# Patient Record
Sex: Male | Born: 1940 | Race: White | Hispanic: No | Marital: Married | Smoking: Former smoker
Health system: Southern US, Community
[De-identification: ages and names within clinical notes are randomized; demographics above are authoritative.]

## PROBLEM LIST (undated history)

## (undated) DIAGNOSIS — R269 Unspecified abnormalities of gait and mobility: Secondary | ICD-10-CM

## (undated) DIAGNOSIS — L89151 Pressure ulcer of sacral region, stage 1: Secondary | ICD-10-CM

## (undated) DIAGNOSIS — Z972 Presence of dental prosthetic device (complete) (partial): Secondary | ICD-10-CM

## (undated) DIAGNOSIS — G8929 Other chronic pain: Secondary | ICD-10-CM

## (undated) DIAGNOSIS — H9190 Unspecified hearing loss, unspecified ear: Secondary | ICD-10-CM

## (undated) DIAGNOSIS — I700 Atherosclerosis of aorta: Secondary | ICD-10-CM

## (undated) DIAGNOSIS — M1990 Unspecified osteoarthritis, unspecified site: Secondary | ICD-10-CM

## (undated) DIAGNOSIS — F329 Major depressive disorder, single episode, unspecified: Secondary | ICD-10-CM

## (undated) DIAGNOSIS — F419 Anxiety disorder, unspecified: Secondary | ICD-10-CM

## (undated) DIAGNOSIS — N400 Enlarged prostate without lower urinary tract symptoms: Secondary | ICD-10-CM

## (undated) DIAGNOSIS — E785 Hyperlipidemia, unspecified: Secondary | ICD-10-CM

## (undated) DIAGNOSIS — I5030 Unspecified diastolic (congestive) heart failure: Secondary | ICD-10-CM

## (undated) DIAGNOSIS — M47812 Spondylosis without myelopathy or radiculopathy, cervical region: Secondary | ICD-10-CM

## (undated) DIAGNOSIS — J42 Unspecified chronic bronchitis: Secondary | ICD-10-CM

## (undated) DIAGNOSIS — N201 Calculus of ureter: Secondary | ICD-10-CM

## (undated) DIAGNOSIS — K649 Unspecified hemorrhoids: Secondary | ICD-10-CM

## (undated) DIAGNOSIS — R29898 Other symptoms and signs involving the musculoskeletal system: Secondary | ICD-10-CM

## (undated) DIAGNOSIS — R200 Anesthesia of skin: Secondary | ICD-10-CM

## (undated) DIAGNOSIS — Z8619 Personal history of other infectious and parasitic diseases: Secondary | ICD-10-CM

## (undated) DIAGNOSIS — K219 Gastro-esophageal reflux disease without esophagitis: Secondary | ICD-10-CM

## (undated) DIAGNOSIS — K5730 Diverticulosis of large intestine without perforation or abscess without bleeding: Secondary | ICD-10-CM

## (undated) DIAGNOSIS — K08109 Complete loss of teeth, unspecified cause, unspecified class: Secondary | ICD-10-CM

## (undated) DIAGNOSIS — F32A Depression, unspecified: Secondary | ICD-10-CM

## (undated) DIAGNOSIS — R600 Localized edema: Secondary | ICD-10-CM

## (undated) DIAGNOSIS — N200 Calculus of kidney: Secondary | ICD-10-CM

## (undated) DIAGNOSIS — J439 Emphysema, unspecified: Secondary | ICD-10-CM

## (undated) DIAGNOSIS — M549 Dorsalgia, unspecified: Secondary | ICD-10-CM

## (undated) DIAGNOSIS — Z789 Other specified health status: Secondary | ICD-10-CM

## (undated) DIAGNOSIS — M1A9XX0 Chronic gout, unspecified, without tophus (tophi): Secondary | ICD-10-CM

## (undated) DIAGNOSIS — I4510 Unspecified right bundle-branch block: Secondary | ICD-10-CM

## (undated) HISTORY — PX: CARPAL TUNNEL RELEASE: SHX101

## (undated) HISTORY — PX: ABDOMINAL HERNIA REPAIR: SHX539

## (undated) HISTORY — DX: Atherosclerosis of aorta: I70.0

## (undated) HISTORY — PX: LAPAROSCOPIC CHOLECYSTECTOMY: SUR755

## (undated) HISTORY — DX: Unspecified hemorrhoids: K64.9

## (undated) HISTORY — PX: ESOPHAGOGASTRODUODENOSCOPY: SHX1529

## (undated) HISTORY — DX: Unspecified abnormalities of gait and mobility: R26.9

## (undated) HISTORY — PX: TONSILLECTOMY AND ADENOIDECTOMY: SUR1326

---

## 1993-04-19 HISTORY — PX: CERVICAL FUSION: SHX112

## 1998-05-27 ENCOUNTER — Ambulatory Visit (HOSPITAL_COMMUNITY): Admission: RE | Admit: 1998-05-27 | Discharge: 1998-05-27 | Payer: Self-pay | Admitting: Cardiology

## 1998-05-27 ENCOUNTER — Encounter: Payer: Self-pay | Admitting: Cardiology

## 1998-05-27 HISTORY — PX: CARDIOVASCULAR STRESS TEST: SHX262

## 1999-04-20 LAB — HM COLONOSCOPY: HM Colonoscopy: NORMAL

## 2000-07-28 ENCOUNTER — Ambulatory Visit (HOSPITAL_COMMUNITY): Admission: RE | Admit: 2000-07-28 | Discharge: 2000-07-28 | Payer: Self-pay | Admitting: Urology

## 2000-07-28 ENCOUNTER — Encounter: Payer: Self-pay | Admitting: Urology

## 2000-07-28 HISTORY — PX: OTHER SURGICAL HISTORY: SHX169

## 2000-08-04 ENCOUNTER — Encounter: Admission: RE | Admit: 2000-08-04 | Discharge: 2000-08-04 | Payer: Self-pay | Admitting: Urology

## 2000-08-04 ENCOUNTER — Encounter: Payer: Self-pay | Admitting: Urology

## 2001-01-26 ENCOUNTER — Encounter: Payer: Self-pay | Admitting: Neurosurgery

## 2001-01-26 ENCOUNTER — Ambulatory Visit (HOSPITAL_COMMUNITY): Admission: RE | Admit: 2001-01-26 | Discharge: 2001-01-26 | Payer: Self-pay | Admitting: Neurosurgery

## 2001-07-03 ENCOUNTER — Ambulatory Visit (HOSPITAL_COMMUNITY): Admission: RE | Admit: 2001-07-03 | Discharge: 2001-07-03 | Payer: Self-pay | Admitting: Neurosurgery

## 2001-07-03 ENCOUNTER — Encounter: Payer: Self-pay | Admitting: Neurosurgery

## 2001-07-03 HISTORY — PX: ANTERIOR CERVICAL DECOMP/DISCECTOMY FUSION: SHX1161

## 2001-07-11 ENCOUNTER — Encounter: Payer: Self-pay | Admitting: Emergency Medicine

## 2001-07-11 ENCOUNTER — Observation Stay (HOSPITAL_COMMUNITY): Admission: EM | Admit: 2001-07-11 | Discharge: 2001-07-12 | Payer: Self-pay | Admitting: Emergency Medicine

## 2001-08-10 ENCOUNTER — Encounter: Payer: Self-pay | Admitting: Neurosurgery

## 2001-08-10 ENCOUNTER — Encounter: Admission: RE | Admit: 2001-08-10 | Discharge: 2001-08-10 | Payer: Self-pay | Admitting: Neurosurgery

## 2001-09-12 ENCOUNTER — Encounter: Payer: Self-pay | Admitting: Neurosurgery

## 2001-09-12 ENCOUNTER — Encounter: Admission: RE | Admit: 2001-09-12 | Discharge: 2001-09-12 | Payer: Self-pay | Admitting: Neurosurgery

## 2002-03-12 ENCOUNTER — Encounter: Admission: RE | Admit: 2002-03-12 | Discharge: 2002-03-12 | Payer: Self-pay | Admitting: Neurosurgery

## 2002-03-12 ENCOUNTER — Encounter: Payer: Self-pay | Admitting: Neurosurgery

## 2004-03-05 ENCOUNTER — Ambulatory Visit: Payer: Self-pay | Admitting: Internal Medicine

## 2004-09-18 ENCOUNTER — Ambulatory Visit: Payer: Self-pay | Admitting: Internal Medicine

## 2005-06-11 ENCOUNTER — Ambulatory Visit: Payer: Self-pay | Admitting: Gastroenterology

## 2006-04-19 HISTORY — PX: BLEPHAROPLASTY: SUR158

## 2006-06-16 ENCOUNTER — Ambulatory Visit (HOSPITAL_COMMUNITY): Admission: RE | Admit: 2006-06-16 | Discharge: 2006-06-16 | Payer: Self-pay | Admitting: Gastroenterology

## 2007-07-06 ENCOUNTER — Encounter: Admission: RE | Admit: 2007-07-06 | Discharge: 2007-07-06 | Payer: Self-pay | Admitting: Neurosurgery

## 2007-08-09 ENCOUNTER — Ambulatory Visit (HOSPITAL_COMMUNITY): Admission: RE | Admit: 2007-08-09 | Discharge: 2007-08-10 | Payer: Self-pay | Admitting: Neurosurgery

## 2007-08-09 HISTORY — PX: OTHER SURGICAL HISTORY: SHX169

## 2007-08-17 ENCOUNTER — Ambulatory Visit (HOSPITAL_COMMUNITY): Admission: AD | Admit: 2007-08-17 | Discharge: 2007-08-18 | Payer: Self-pay | Admitting: Neurosurgery

## 2007-08-17 ENCOUNTER — Encounter: Admission: RE | Admit: 2007-08-17 | Discharge: 2007-08-17 | Payer: Self-pay | Admitting: Neurosurgery

## 2008-03-12 ENCOUNTER — Encounter: Admission: RE | Admit: 2008-03-12 | Discharge: 2008-03-12 | Payer: Self-pay

## 2008-03-29 ENCOUNTER — Encounter: Admission: RE | Admit: 2008-03-29 | Discharge: 2008-03-29 | Payer: Self-pay | Admitting: Neurosurgery

## 2008-04-16 ENCOUNTER — Encounter: Admission: RE | Admit: 2008-04-16 | Discharge: 2008-04-16 | Payer: Self-pay | Admitting: Neurosurgery

## 2008-04-30 ENCOUNTER — Encounter: Admission: RE | Admit: 2008-04-30 | Discharge: 2008-04-30 | Payer: Self-pay | Admitting: Neurosurgery

## 2008-07-02 ENCOUNTER — Encounter: Admission: RE | Admit: 2008-07-02 | Discharge: 2008-07-02 | Payer: Self-pay | Admitting: Neurosurgery

## 2008-07-23 ENCOUNTER — Ambulatory Visit (HOSPITAL_BASED_OUTPATIENT_CLINIC_OR_DEPARTMENT_OTHER): Admission: RE | Admit: 2008-07-23 | Discharge: 2008-07-23 | Payer: Self-pay | Admitting: Orthopedic Surgery

## 2008-07-23 HISTORY — PX: NEUROPLASTY / TRANSPOSITION ULNAR NERVE AT ELBOW: SUR895

## 2009-07-21 ENCOUNTER — Encounter: Admission: RE | Admit: 2009-07-21 | Discharge: 2009-07-21 | Payer: Self-pay | Admitting: Neurosurgery

## 2009-07-23 ENCOUNTER — Encounter: Admission: RE | Admit: 2009-07-23 | Discharge: 2009-07-23 | Payer: Self-pay | Admitting: Neurosurgery

## 2009-09-13 ENCOUNTER — Encounter: Admission: RE | Admit: 2009-09-13 | Discharge: 2009-09-13 | Payer: Self-pay | Admitting: Neurosurgery

## 2009-09-22 ENCOUNTER — Ambulatory Visit (HOSPITAL_COMMUNITY): Admission: RE | Admit: 2009-09-22 | Discharge: 2009-09-22 | Payer: Self-pay | Admitting: Neurosurgery

## 2009-10-15 ENCOUNTER — Inpatient Hospital Stay (HOSPITAL_COMMUNITY): Admission: RE | Admit: 2009-10-15 | Discharge: 2009-10-15 | Payer: Self-pay | Admitting: Neurosurgery

## 2009-10-15 HISTORY — PX: OTHER SURGICAL HISTORY: SHX169

## 2010-05-28 ENCOUNTER — Other Ambulatory Visit: Payer: Self-pay | Admitting: Neurosurgery

## 2010-05-28 DIAGNOSIS — M545 Low back pain: Secondary | ICD-10-CM

## 2010-05-28 DIAGNOSIS — M542 Cervicalgia: Secondary | ICD-10-CM

## 2010-06-03 ENCOUNTER — Other Ambulatory Visit: Payer: Self-pay | Admitting: Neurosurgery

## 2010-06-03 ENCOUNTER — Ambulatory Visit
Admission: RE | Admit: 2010-06-03 | Discharge: 2010-06-03 | Disposition: A | Discharge status: Home / Self Care | Payer: Medicare Other | Source: Ambulatory Visit | Attending: Neurosurgery | Admitting: Neurosurgery

## 2010-06-03 DIAGNOSIS — M542 Cervicalgia: Secondary | ICD-10-CM

## 2010-06-03 DIAGNOSIS — M545 Low back pain: Secondary | ICD-10-CM

## 2010-06-03 MED ORDER — GADOBENATE DIMEGLUMINE 529 MG/ML IV SOLN: 19.0000 mL | Freq: Once | INTRAVENOUS | Status: AC | PRN

## 2010-07-05 LAB — CBC
HCT: 40.7 % (ref 39.0–52.0)
Hemoglobin: 14 g/dL (ref 13.0–17.0)
MCV: 96.9 fL (ref 78.0–100.0)
Platelets: 194 10*3/uL (ref 150–400)
RBC: 4.2 MIL/uL — ABNORMAL LOW (ref 4.22–5.81)

## 2010-07-05 LAB — BASIC METABOLIC PANEL
Calcium: 8.9 mg/dL (ref 8.4–10.5)
GFR calc non Af Amer: >60 mL/min (ref >60)

## 2010-07-06 LAB — CBC
HCT: 44.9 % (ref 39.0–52.0)
Hemoglobin: 15.6 g/dL (ref 13.0–17.0)
MCHC: 34.6 g/dL (ref 30.0–36.0)
MCV: 96.8 fL (ref 78.0–100.0)
Platelets: 217 10*3/uL (ref 150–400)
RDW: 12.8 % (ref 11.5–15.5)

## 2010-07-06 LAB — BASIC METABOLIC PANEL
CO2: 28 mEq/L (ref 19–32)
Chloride: 102 mEq/L (ref 96–112)
Creatinine, Ser: 0.99 mg/dL (ref 0.4–1.5)
GFR calc Af Amer: >60 mL/min (ref >60)
GFR calc non Af Amer: >60 mL/min (ref >60)
Glucose, Bld: 95 mg/dL (ref 70–99)
Potassium: 5.1 mEq/L (ref 3.5–5.1)

## 2010-07-15 ENCOUNTER — Inpatient Hospital Stay (HOSPITAL_COMMUNITY): Admission: RE | Admit: 2010-07-15 | Payer: Medicare Other | Source: Ambulatory Visit | Admitting: Neurosurgery

## 2010-07-29 LAB — POCT HEMOGLOBIN-HEMACUE: Hemoglobin: 17.3 g/dL — ABNORMAL HIGH (ref 13.0–17.0)

## 2010-09-01 NOTE — Op Note (Signed)
NAMEDEVAUGHN, SAVANT NO.:  [id number]   MEDICAL RECORD NO.:  [id number]          PATIENT TYPE:  INP   LOCATION:  3038                         FACILITY:  [facility]   PHYSICIAN:  Donalee Citrin, M.D.        DATE OF BIRTH:  September 19, 1940   DATE OF PROCEDURE:  08/17/2007  DATE OF DISCHARGE:  08/18/2007                               OPERATIVE REPORT   PREOPERATIVE DIAGNOSIS:  Anterior cervical wound, retropharyngeal  hematoma.   PROCEDURE:  Re-exploration of anterior cervical wound for evacuation of  retropharyngeal hematoma.   SURGEON:  Donalee Citrin, MD   ANESTHESIA:  General endotracheal.   HISTORY OF PRESENT ILLNESS:  The patient is a very pleasant 70 year old  gentleman who underwent anterior cervical diskectomy and fusion  approximately a week ago, presented with about 24-48 hours worth of  difficulty when he is lying flat, breathing and clearing his secretions.  The patient was evaluated and underwent a CT scan, which showed a large  3.5-cm hematoma overlying anterior in the prevertebral space.  The  anterior cervical wound with some compression displacement of his  trachea.  The patient was recommended reexploration and evacuation of  hematoma.  Risks and benefits of the operation were explained to the  patient.  He understood and agreed to proceed forth.   The patient was brought to the OR, induced under general anesthesia,  positioned supine and the neck in slight extension 5 pounds of Halter  traction.  The right neck was prepped and draped in usual sterile  fashion.  His old incision was opened up.  There was a small amount of  organized hematoma in the superficial platysmal space.  The platysmal  sutures were divided in the plane and was immediately developed down to  the prevertebral space.  There was a large amount of liquid clot  immediately it came out under pressure.  This was all aspirated.  There  was no active bleeding and some small little areas were  coagulated just  for completeness sake; however, with retractor in there was nothing  collecting around the plate.  The plate was clearly visualized and  retractors were removed sequentially and gone back in multiple times to  confirm no accumulation of blood.  There was no organized blood in the  prevertebral space, it was all liquid.  Then, after adequate hemostasis  had been maintained, a JP drain was placed and the wound was closed in  layers again with interrupted Vicryls in the platysma in a running 4-0  subcuticular.  Benzoin and Steri-Strips applied.  The patient went to  the recovery room in stable condition.  At the end of the case, needle  and sponge counts were correct.           ______________________________  Donalee Citrin, M.D.     GC/MEDQ  D:  08/17/2007  T:  08/18/2007  Job:  [id number]

## 2010-09-01 NOTE — Op Note (Signed)
Evan [name], Evan [name]        ACCOUNT NO.:  [id number]   MEDICAL RECORD NO.:  [id number]          PATIENT TYPE:  AMB   LOCATION:  DSC                          FACILITY:  [facility]   PHYSICIAN:  Katy Fitch. Sypher, M.D. 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   OPERATION:  Anterior subcutaneous transposition of left ulnar nerve  across the medial elbow.   OPERATING SURGEON:  Katy Fitch. Sypher, MD   ASSISTANT:  Marveen Reeks Dasnoit, PA-C   ANESTHESIA:  General by LMA.   SUPERVISING ANESTHESIOLOGIST:  Maren Beach, MD   INDICATIONS:  Evan [name] is a 70 year old gentleman referred  for evaluation and management of profound left ulnar neuropathy.  He has  had extensive neurosurgical treatment by Dr. Jeral Fruit and Dr. Wynetta Emery for  multiple cervical nerve root impairments.  He was noted to have a severe  left ulnar neuropathy and was referred for an upper extremity orthopedic  consult.  Electrodiagnostic studies completed by Dr. Johna Roles revealed  profound slowing of the left ulnar nerve across the elbow.   Given Evan [name]'s severe intrinsic atrophy and marked  abnormalities on the electrodiagnostic study, we informed him that the  prognosis for recovery at age of 18 is very guarded.  However, given his  significant functional impairment we did recommend proceeding with  exploration of the ulnar nerve, anterior transposition, and observation  over a period of 18 months postoperatively to determine if his function  could be improved.   Preoperatively, he was advised of potential risks and benefits of  surgery.  Questions were invited and answered in detail.   PROCEDURE IN DETAIL:  Evan [name] was brought to the operating  room and placed in supine position upon the operating table.   Following anesthesia consult with Dr. Katrinka Blazing, general anesthesia by LMA  technique was recommended and accepted.  He was brought to room 5 at the  [facility], placed in supine position upon the operating  table, and under Dr. Michaelle Copas direct supervision, general anesthesia by  LMA technique induced.   The entire left upper extremity was prepped with Betadine soap and  solution, and sterilely draped.  A pneumatic tourniquet was applied to  the proximal left brachium.   Following exsanguination of the left arm with Esmarch bandage, the  arterial tourniquet was inflated to 250 mmHg.  A 6-cm curvilinear  incision was fashioned directly over the path of the ulnar nerve.  Subcutaneous tissues were carefully divided taking care to identify and  gently protect the posterior branch of medial antebrachial cutaneous  nerve.  The nerve was unroofed 7 cm above the epicondyle and 7 cm  distally.  Great care was taken to create a pedicle with the ulnar nerve  and its accompanying epineurial and feeding vessels allowing  transposition into an anterior subcutaneous position.  The medial  brachial septum was formally identified and resected with cutting  cautery and scissors.  The ulnar nerve was intramuscular and a part of  the triceps proximally and required a rather extensive dissection of the  muscle.  Care was taken to avoid any kinking of the nerve or pressure of  fascial structures against the nerve.   The fascia at the head of the flexor carpi ulnaris was resected allowing  a transposition of the nerve well beyond the epicondyle with a direct  pass entering the flexor carpi ulnaris.  Care was taken to formally  dissect out the  articular branch of the nerve and perform an  aponeurotomy to allow the articular branch to separate from the nerve  proper.  The nerve was then transposed and secured by a small fascial  wall secured with multiple interrupted sutures of 3-0 Vicryl.   A Freer elevator was used to check the path of nerve in all positions of  elbow flexion, noting no evidence of kinking or other pressure upon the  nerve.   The tourniquet was released with immediate capillary fill to the hand  and fingers.  The wound was repaired with subcutaneous suture of 3-0  Vicryl and intradermal 3-0 Prolene and Steri-Strips.   Evan [name] was placed in a compressive dressing with a Xeroflo  sterile gauze and a Tegaderm dressing.  A Ace wrap was provided for  postoperative compression.   Evan [name] was awakened from general anesthesia and transferred to  the recovery room with stable vital signs.  As we had explained in the  preoperative holding area and in the office, we will need to wait about  18 months to see the full benefit of surgery.  Given his complex  cervical pathology, we cannot make any guarantees as to intrinsic  recovery.      Katy Fitch Sypher, M.D.  Electronically Signed     RVS/MEDQ  D:  07/23/2008  T:  07/24/2008  Job:  [id number]

## 2010-09-01 NOTE — Op Note (Signed)
NAMEBRYDON, SPAHR NO.:  [id number]   MEDICAL RECORD NO.:  [id number]          PATIENT TYPE:  INP   LOCATION:  3534                         FACILITY:  [facility]   PHYSICIAN:  Donalee Citrin, M.D.        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   PROCEDURES:  Exploration and fusion C4-C5, redo fusion with implantation  with a 7-mm allograft wedge at C4-C5 with removal of hardware, anterior  cervical diskectomy and fusion C3-C4 using a 7-mm allograft wedge and a  27.5-mm Atlantis plate spanning from C3-C5 with six 2-mm variable-length  screws with 4 of them being rescue screws.   SURGEON:  Donalee Citrin, M.D.   ASSISTANT:  Tia Alert, M.D.   HISTORY OF PRESENT ILLNESS:  The patient is a 70 year old gentleman who  had undergone previous anterior cervical diskectomy and fusion, had done  very well.  However, over the last several weeks to months, the patient  with progressive or worsening neck pain and bilateral arm pain with  tingling and numbness in his fingers and weakness in his hands.  Repeat  MRI scan showed superior spinal stenosis C3-C4 with signal change within  the spinal cord, a spondylotic ridge coming off the C3 vertebral body as  well as a ruptured disk at C3-C4.  In addition, showed some hint of a  resorption of his bone graft at C4-C5.  Plain films also showed a hint  of pseudoarthrosis.  The patient was recommended a reexploration and  fusion at C4-C5 with removal of hardware, anterior cervical diskectomy  and fusion at C3-C4.  Risks and benefits of the operation were explained  to the patient.  He understood and agreed to  proceed forth.   PROCEDURE IN DETAIL:  The patient was brought to the OR, was induced  general anesthesia, positioned supine after correctly identified  __________  prepped and draped in usual sterile fashion.  His old  incision was localized and this was felt to be too inferior to the C3-C4  disk space, so a curvilinear incision was made just superior to this  from just off the midline to the anterior portion of the  sternocleidomastoid.  Superficially, the platysma was dissected out and  divided longitudinally.  The __________  fascia was dissected free with  Kitners.  The extensive scar was overlying the plate at Z6-X0.  This was  dissected free with Kitners and Bovie electrocautery.  Once the plate  was identified, the locking mechanism was disengaged and screws were  removed.  The screws were noted to be loose at this level and  predominantly in the C5 vertebral body.  After the plate was removed,  the disk space at the level of the previous fusion was explored and this  was noted to be incompletely fused with a profound pseudoarthrosis at  this level, so the disk space was marked.  Attention was taken to C3-C4,  and further along its course, reflected laterally, identifying the C3-C4  disk space.  At this point, a self-retaining retractor was placed and  the disk space was further incised with a 15-blade scalpel, confirmed by  fluoroscopy at the appropriate level and the identification of the C4-C5  pseudoarthrosis.  First working at C3-C4, the disk space was drilled  down to approximately 8 or 9 mm in width down the posterior annulus  __________ .  The operating microscope was draped __________ The  remainder of the posterior osteophyte was drilled down further using 1-  mm Kerrison punch.  Posterior annulus and posterior longitudinal  ligament were identified and removed in piecemeal fashion.  There was  noted to be a large free fragment disk herniation that migrated to the   subligamentous space and thecal sac dorsally.  This was teased away with  a blunt nerve hook decompressing the central canal and then aggressive  underbiting of both C3-C4 endplates were carried out decompressing and  underbiting a large osteophyte coming off the C3 vertebral body, which  also decompressed the central canal.  The interspace was marched out to  the pedicles, flushed the pedicle, and C4 proximal nerve root was  identified and __________  to confirm adequate margins for the  diskectomy.  After aggressive underbiting of both endplates and removal  of ruptured disks, the central canal was markedly decompressed.  The  thecal sac resumed normal anatomic position.  The endplates were scraped  and Gelfoam was packed.  __________ the pseudoarthrosis site.  The old  bone graft was drilled down to good bony endplates at C4-C5 just enough  to take a 5-mm allograft wedge which was prelordosed to 6 mm.  This was  inserted under compression, had good apposition of the endplates,  approximately 1 mm __________  C3-4.  The wound was copiously irrigated,  and Gelfoam was removed.  A size 7-mm allograft which lordosed to 8 was  inserted at C3-C4.  Then, an Atlantis plate was placed.  Rescue screws  were used to place in the old holes, all screws had excellent purchase.  New screw holes were drilled at C3 and these also had excellent  purchase.  Locking mechanism was then engaged.  Postop fluoroscopy  confirmed good position of bone grafts, plates, and screws.  Then, the  wound was copiously irrigated.  Hemostasis was maintained.  The platysma  was reapproximated with 3-0 interrupted Vicryl, skin was closed with  running 4-0 subcuticular.  Benzoin and Steri-Strips were placed.  The  patient went to the recovery room in stable condition.  At the end of  the case, needle and sponge counts were correct.           ______________________________  Donalee Citrin, M.D.     GC/MEDQ  D:  08/09/2007   T:  08/10/2007  Job:  [id number]

## 2010-09-04 NOTE — H&P (Signed)
[name], [name] NO.:  [id number]   MEDICAL RECORD NO.:  [id number]          PATIENT TYPE:  INP   LOCATION:  3038                         FACILITY:  [facility]   PHYSICIAN:  Donalee Citrin, M.D.        DATE OF BIRTH:  03-13-1941   DATE OF ADMISSION:  08/17/2007  DATE OF DISCHARGE:  08/18/2007                              HISTORY & PHYSICAL   ADMITTING DIAGNOSIS:  Anterior cervical wound hematoma.   DISCHARGE DIAGNOSIS:  Anterior cervical wound hematoma.   HISTORY AND PHYSICAL:  Evan [name] is a 70 year old gentleman who  approximately a week ago underwent anterior cervical discectomy fusion.  He was doing very well.  Over the last 24 hours, he has had increase in  difficulty with swallowing as wells as feels like he is gagging at night  when he lies down.  I saw him in the office and he definitely had little  bit swelling around the incision, but a lot of swallowing difficulty.  He was sent for emergent soft tissue.  CT of his neck showed a large  retropharyngeal hematoma and the patient had been admitted for  evacuation of retropharyngeal hematoma.  For further details of past  medical, past surgical history, medications, and allergies, please see  previous H and P for admission approximately 1 week ago.   PHYSICAL EXAMINATION:  GENERAL:  The patient is awake, alert, and  oriented.  EYES:  Pupils are equal, round, and reactive to light.  Extraocular  movements are intact.  HEENT:  Within limits, except for a mild swelling around the incision.  NEUROLOGIC:  His upper extremity strength and deltoid, biceps, triceps,  wrist flexors, and hand intrinsics.  Also lower extremity strength is  5/5.  Reflex is normal symmetrically.  He has no neurologic symptoms  and/or signs from this.   CT scan shows a large retropharyngeal hematoma.   ASSESSMENT AND PLAN:  A 70 year old gentleman admitted for emergent  evacuation of retropharyngeal hematoma.  Risks and benefits  of the  operation were explained to the patient, and he understood and agreed to  proceed forth.           ______________________________  Donalee Citrin, M.D.     GC/MEDQ  D:  09/06/2007  T:  09/07/2007  Job:  [id number]

## 2010-09-04 NOTE — Op Note (Signed)
[place]  Patient:    Evan [name], Evan [name] Visit Number: [id number] MRN: [id number]          Service Type: DSU Location: 3000 3006 01 Attending Physician:  Mariam Dollar Dictated by:   Garlon Hatchet., M.D. Proc. 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  PROCEDURE:  Intracervical diskectomy and fusion at C4-5 with a 7 mm patellar ______ wedge and a 23 mm Lantus plate, three 13 mm variable angle screws, and one 13 mm rescue screw.  SURGEON:  Garlon Hatchet., M.D.  ASSISTANT:  Annie Sable, Montez Hageman., M.D.  HISTORY OF PRESENT ILLNESS:  The patient is a very pleasant 70 year old gentleman who has had long-standing neck and arm pain status post anterior cervical diskectomy and fusion at C5-6, who presented with pain into both shoulders on the right side greater than the left with popping and cracking in his neck and severe neck pain.  The patients preoperative x-ray showed severe cervical spondylosis and stenosis with spinal cord compression at C4-5.  The patient had failed conservative treatment.  Recommended anterior cervical diskectomy and fusion.  The risks and benefits were explained and he understands and he agrees to proceed forward.  DESCRIPTION OF PROCEDURE:  The patient was brought to the operating room, was induced under general anesthesia.  Positioned supine with neck in slight extension.  Five pounds of Halter traction.  The right side of his neck was prepped in routine sterile fashion.  A curvilinear incision was marked out and preoperative x-ray localized.  Needle over the C5-6 disk space.  A curvilinear incision was made just above this just off the midline to the anterior body of the sternocleidomastoid.  The superficial platysma was dissected out and divided  longitudinally.  ______ the sternocleidomastoid and ______ was developed down the prepectoral fascia.  The prepectoral fascia was dissected with Kitners.  There was significant scar tissue and very thickened prevertebral ligament and fascia.  This was bipolared and bovied away.  Then first mark with the needle showed the C3-4 disk space.  A second x-ray was taken of the C4-5 disk space.  This annulotomy was with an 11 blade scalpel. Pituitary rongeurs were used to remove the ______ and ______.  There was noted to be a large anterior osteophyte, of the C5 vertebral body, and this disk space was noted to be very collapsed anteriorly.  After the longus coli was reflected laterally and the self-retaining retractor was placed, the high speed drill was brought in and drilled off the anterior osteophytes and drilled down the end plates to the posterior longitudinal ligament.  Posterior osteophyte then using 1 and 2 mm Kerrison punches, the remainder of the osteophytes were removed.  There was a large amount of osteophytic compression coming off both C4 and the C5 vertebral body, predominately C5, and uncinate hypertrophy compressing both C5 nerve roots especially on the right.  This was removed in a piecemeal fashion with 1 and 2 mm Kerrison punches.  The posterior longitudinal ligament was also removed in a piecemeal fashion exposing the thecal sac.  Operating microscope was draped and brought into the field.  Under microscopic examination, the remainder of both C5 nerve roots were decompressed at the  proximal aspect of the foramen, explored with angled nerve hook, and noted to have no further compression.  All the midline stenosis was removed, and all osteophytes were underbitten.  Then the wound was copiously irrigated.  Meticulous hemostasis was maintained.  The end plates were then scraped and prepared for arthrodesis.  A 7 mm ______ wedge was sized, selected, and 2 mm cut off the depth and  inserted after antibiotic solution had been placed.  Then using a 23 mm Lantus blade this was aligned, sized, and placed.  Then two 13 mm variable angle screws were inserted in the body of C4 after being drilled and tapped.  The right inferior screw was noted to be strip, and rescues was placed, and was noted to have adequate purchase, and then the left side at C5 was also noted to have excellent purchase, as well as both screws at C4.  The wound was copiously irrigated.  Meticulous hemostasis was maintained.  The platysma was reapproximated with 3-0 interrupted Vicryl.  The skin was closed with running 4-0 subcuticular. Benzoin and Steri-Strips were applied.  The patient went to the recovery room in stable condition.  At the end of the case all needle counts and sponge counts were correct. Dictated by:   Garlon Hatchet., M.D. Attending Physician:  Mariam Dollar DD:  07/03/01 TD:  07/04/01 Job: [id number]

## 2010-09-04 NOTE — Op Note (Signed)
[place]  Patient:    SHEPPARD, LUCKENBACH Visit Number: [id number] MRN: [id number]          Service Type: MED Location: 3000 3020 01 Attending Physician:  Mariam Dollar Dictated by:   Garlon Hatchet., M.D. Proc. Date: 07/11/01 Admit Date:  07/11/2001 Discharge Date: 07/12/2001                             Operative Report  PREOPERATIVE DIAGNOSIS:  Cervical hematoma, status post anterior cervical diskectomy and fusion.  PROCEDURE:  Re-exploration of anterior cervical diskectomy and fusion wound and evacuation of cervical hematoma.  SURGEON:  Garlon Hatchet., M.D.  ANESTHESIA:  General endotracheal.  INDICATIONS FOR PROCEDURE:  The patient is a very pleasant 70 year old gentleman who is one week out from anterior cervical diskectomy and fusion who was doing very well.  Had progressive dysphagia over the last week or so, and breathing difficulty today.  Came to the emergency room.  CT scan showed a greater than 3 cm cervical hematoma anterior to the plate.  The patient was evaluated and noted to be in mild respiratory distress and the patient was emergently brought to the operating room for re-exploration and evacuation of hematoma and cervical wound.  DESCRIPTION OF PROCEDURE:  The patient is brought to the operating room.  Was induced under general anesthesia.  Positioned supine with the neck in Halter traction.  The right side of his neck was prepped and draped in routine sterile fashion.  His old incision was incised.  The suture closing his platysma were opened up, and his wound was opened up.  There was a lot of old hematoma that was aspirated, as well as some organized clot anterior to the plate.  Using the hand held retractors, the wound was explored, and several fragments of organized hematoma were removed, and self-retaining retractors were placed.  The plate was visualized.  All plates, screws, and hardware were noted to be in  good position.  The wound was explored up to the anterior vertebral body of C3, and all small amounts of organized hematoma were removed and washed out.  There was no evidence of infection.  The esophagus was carefully inspected.  There was no evidence of breech of the esophagus.  The remainder of the longus coli was coagulated.  There was a mild amount of oozing coming from the lung.  Most of the clot appeared to be old and organized.  The wound was copiously irrigated with bacitracin saline. Meticulous hemostasis was again maintained.  A 7 mm fully fluted flat JP drain was placed, and the platysma was reapproximated with interrupted 3-0 Vicryl, the skin was closed with running 4-0 subcuticular.  Benzoin and Steri-Strips were applied.  The patient went to the recovery room in stable condition.  At the end of the case, needle counts and sponge counts were correct.Dictated by: Garlon Hatchet., M.D. Attending Physician:  Mariam Dollar DD:  07/11/01 TD:  07/12/01 Job: [id number]

## 2010-09-04 NOTE — Op Note (Signed)
[facility]  Patient:    Evan [name], Evan [name]               MRN: [id number] Proc. Date: 07/28/00 Adm. Date:  [id number] Attending:  Laqueta Jean                           Operative Report  PREOPERATIVE DIAGNOSIS:  Left ureteral stone.  POSTOPERATIVE DIAGNOSIS:  Passed left ureteral stone.  OPERATION:  Cystourethroscopy, left retrograde pyelogram, balloon dilation of the left ureter, left ureteroscopy, left double-J catheter.  SURGEON:  Sigmund I. Patsi Sears, M.D.  ANESTHESIA:  General (LMA).  PREPARATION:  After appropriate preanesthesia, the patient was brought to the operating room, placed on the operating table in the dorsal supine position where general mask anesthesia was introduced.  He was then replaced in the dorsal lithotomy position where the pubis was prepped with Betadine solution and draped in the usual fashion.  REVIEW OF HISTORY:  [name] has been seen in March 2002, with left low flank pain and noncontrast CT showing a 4 mm stone in the distal aspect of the left ureter with left-sided hydronephrosis present.  The patient has not passed his stone and is now for basket extraction.  DESCRIPTION OF PROCEDURE:  Cystoscopy was accomplished which showed a 4+ large prostate with trabeculation but without ______ formation.  There was no evidence of bladder stone or bladder tumor.  Because of the trabeculation and early cell formation, it was difficult to identify the left ureteral orifice and, once identified, the orifice was cannulated with a guidewire.  The guidewire was passed to the level of the renal pelvis.  Fluoroscopy showed no evidence of stone, although review of the CT scan does show a calcification in the area of the left lower ureter consistent with a 4 mm left low ureteral stone.  Balloon dilation the left lower ureteral orifice is accomplished with a 4 cm balloon dilator for three minutes and,  following this, the ureteroscope was again attempted to be passed but cannot be passed into the ureter because of the difficulty of the location of the ureteral orifice.  Therefore, it was passed over the guidewire into the ureter and then easily manipulated into the ureter and up to the level of the kidney.  No stone could be identified.  The process was twice repeated and no stones identified.  Photodocumentation of an area in the left lower ureter, which was the area where the stone was felt to have been stuck, was identified.  No stone was seen in the bladder.  No stone could be identified in the kidney.  It was felt that the patient may have passed the stone; therefore, because of manipulation, it was felt that a double-J catheter should be passed.  A 6 x 26 catheter was coiled in the kidney and into the bladder.  The guidewire was removed.  The bladder was drained of fluid.  The patient was awakened after given Xylocaine jelly, B&O suppository, and IV Toradol and taken to the recovery room in good condition. DD:  07/28/00 TD:  07/28/00 Job: [id number]

## 2011-01-12 LAB — CBC
HCT: 45.2
HCT: 48.5
Hemoglobin: 15.2
Hemoglobin: 16.4
MCHC: 33.5
MCHC: 33.8
MCV: 95.3
MCV: 95.8
Platelets: 211
Platelets: 235
RBC: 4.72
RBC: 5.09
RDW: 13.4
RDW: 13.6
WBC: 11.8 — ABNORMAL HIGH
WBC: 8.5

## 2011-01-12 LAB — BASIC METABOLIC PANEL
BUN: 19
CO2: 23
Calcium: 9.1
Chloride: 105
Creatinine, Ser: 0.79
Creatinine, Ser: 0.88
GFR calc Af Amer: >60

## 2011-01-12 LAB — PROTIME-INR
INR: 1
Prothrombin Time: 13.4

## 2011-03-26 ENCOUNTER — Telehealth: Payer: Self-pay | Admitting: Internal Medicine

## 2011-03-30 NOTE — Telephone Encounter (Signed)
Dr.Hopper please advise and forward to Frann Rider (Scheduler side B) if patient to re-establish

## 2011-03-30 NOTE — Telephone Encounter (Signed)
Please pull any old chart; thanks

## 2011-03-31 NOTE — Telephone Encounter (Signed)
Chart placed in MD box for review.

## 2011-03-31 NOTE — Telephone Encounter (Signed)
I apologize but I have not seen him for almost 7 years. He should continue with the physician he is seeing as he would be a new Medicare patient and my practice has ben closed by administration for new Medicare patients.

## 2011-04-14 ENCOUNTER — Encounter (HOSPITAL_COMMUNITY): Payer: Self-pay | Admitting: Pharmacy Technician

## 2011-04-19 NOTE — Telephone Encounter (Signed)
Thank you for considering me as a primary care physician.Unfortunately I am unable to schedule new patients for multiple reasons. The most important is that I need to focus on providing my present active  patients  adequate care & follow up.Electronic medical records and excess documentation (paper work) required by managed care has  greatly impacted  physicians' time  to  provide direct patient care. Also I hope to increase teaching responsibilities through the nurse practitioner programs at [facility] in 2013-14.   Harriett Sine, could you please review the paragraph above to see if it's appropriate for the staff & to use  to inform any patients requesting to become my patient. Thanks, [employer]

## 2011-04-21 ENCOUNTER — Other Ambulatory Visit: Payer: Self-pay

## 2011-04-21 ENCOUNTER — Encounter (HOSPITAL_COMMUNITY): Payer: Self-pay | Admitting: Pharmacy Technician

## 2011-04-21 ENCOUNTER — Encounter (HOSPITAL_COMMUNITY)
Admission: RE | Admit: 2011-04-21 | Discharge: 2011-04-21 | Disposition: A | Discharge status: Home / Self Care | Payer: Medicare Other | Source: Ambulatory Visit | Attending: Neurosurgery | Admitting: Neurosurgery

## 2011-04-21 ENCOUNTER — Encounter (HOSPITAL_COMMUNITY): Payer: Self-pay

## 2011-04-21 ENCOUNTER — Encounter (HOSPITAL_COMMUNITY)
Admission: RE | Admit: 2011-04-21 | Discharge: 2011-04-21 | Disposition: A | Discharge status: Home / Self Care | Payer: Medicare Other | Source: Ambulatory Visit | Attending: Anesthesiology | Admitting: Anesthesiology

## 2011-04-21 DIAGNOSIS — Z01818 Encounter for other preprocedural examination: Secondary | ICD-10-CM | POA: Insufficient documentation

## 2011-04-21 DIAGNOSIS — J4489 Other specified chronic obstructive pulmonary disease: Secondary | ICD-10-CM | POA: Insufficient documentation

## 2011-04-21 DIAGNOSIS — J449 Chronic obstructive pulmonary disease, unspecified: Secondary | ICD-10-CM | POA: Insufficient documentation

## 2011-04-21 HISTORY — DX: Gastro-esophageal reflux disease without esophagitis: K21.9

## 2011-04-21 HISTORY — DX: Depression, unspecified: F32.A

## 2011-04-21 HISTORY — DX: Major depressive disorder, single episode, unspecified: F32.9

## 2011-04-21 HISTORY — DX: Benign prostatic hyperplasia without lower urinary tract symptoms: N40.0

## 2011-04-21 LAB — BASIC METABOLIC PANEL
Calcium: 9.3 mg/dL (ref 8.4–10.5)
Chloride: 102 mEq/L (ref 96–112)
Creatinine, Ser: 0.8 mg/dL (ref 0.50–1.35)
GFR calc Af Amer: >90 mL/min (ref >90)
Sodium: 139 mEq/L (ref 135–145)

## 2011-04-21 LAB — CBC
MCH: 32.2 pg (ref 26.0–34.0)
MCV: 92.5 fL (ref 78.0–100.0)
Platelets: 199 10*3/uL (ref 150–400)
RDW: 12.3 % (ref 11.5–15.5)
WBC: 8.3 10*3/uL (ref 4.0–10.5)

## 2011-04-21 LAB — SURGICAL PCR SCREEN
MRSA, PCR: NEGATIVE
Staphylococcus aureus: NEGATIVE

## 2011-04-21 NOTE — Telephone Encounter (Signed)
I think this got to me by mistake

## 2011-04-21 NOTE — Pre-Procedure Instructions (Signed)
20 Evan [name]  04/21/2011   Your procedure is scheduled on: 05/04/10 (Wednesday)  Report to [facility] at 08:30 AM.  Call this number if you have problems the morning of surgery: [phone]   Remember:   Do not eat food:After Midnight.  May have clear liquids: up to 4 Hours before arrival. (4:30 AM)  Clear liquids include soda, tea, black coffee, apple or grape juice, broth.  Take these medicines the morning of surgery with A SIP OF WATER: Flomax, Zoloft, Prilosec    Do not wear jewelry, make-up or nail polish.  Do not wear lotions, powders, or perfumes. You may wear deodorant.  Do not shave 48 hours prior to surgery.  Do not bring valuables to the hospital.  Contacts, dentures or bridgework may not be worn into surgery.  Leave suitcase in the car. After surgery it may be brought to your room.  For patients admitted to the hospital, checkout time is 11:00 AM the day of discharge.   Patients discharged the day of surgery will not be allowed to drive home.  Name and phone number of your driver: N/A  Special Instructions: CHG Shower Use Special Wash: 1/2 bottle night before surgery and 1/2 bottle morning of surgery.   Please read over the following fact sheets that you were given: Coughing and Deep Breathing and Surgical Site Infection Prevention, CHG and Mupirocin Ointment Info Sheet

## 2011-04-28 NOTE — Telephone Encounter (Signed)
Informed patient of Dr. Frederik Pear decision.

## 2011-05-05 ENCOUNTER — Inpatient Hospital Stay (HOSPITAL_COMMUNITY): Admission: RE | Admit: 2011-05-05 | Payer: Medicare Other | Source: Ambulatory Visit | Admitting: Neurosurgery

## 2011-05-05 ENCOUNTER — Encounter (HOSPITAL_COMMUNITY): Admission: RE | Payer: Self-pay | Source: Ambulatory Visit

## 2011-05-05 SURGERY — POSTERIOR CERVICAL FUSION/FORAMINOTOMY LEVEL 1
Anesthesia: General

## 2011-06-04 ENCOUNTER — Encounter (HOSPITAL_COMMUNITY): Payer: Self-pay

## 2011-06-04 ENCOUNTER — Encounter (HOSPITAL_COMMUNITY)
Admission: RE | Admit: 2011-06-04 | Discharge: 2011-06-04 | Disposition: A | Discharge status: Home / Self Care | Payer: Medicare Other | Source: Ambulatory Visit | Attending: Neurosurgery | Admitting: Neurosurgery

## 2011-06-04 HISTORY — DX: Unspecified hearing loss, unspecified ear: H91.90

## 2011-06-04 LAB — BASIC METABOLIC PANEL
BUN: 26 mg/dL — ABNORMAL HIGH (ref 6–23)
Creatinine, Ser: 0.63 mg/dL (ref 0.50–1.35)
GFR calc Af Amer: >90 mL/min (ref >90)
GFR calc non Af Amer: >90 mL/min (ref >90)

## 2011-06-04 LAB — CBC
MCHC: 34 g/dL (ref 30.0–36.0)
Platelets: 218 10*3/uL (ref 150–400)
RDW: 12.5 % (ref 11.5–15.5)
WBC: 9.1 10*3/uL (ref 4.0–10.5)

## 2011-06-04 LAB — SURGICAL PCR SCREEN
MRSA, PCR: NEGATIVE
Staphylococcus aureus: NEGATIVE

## 2011-06-04 NOTE — Pre-Procedure Instructions (Signed)
20 Evan [name]  06/04/2011   Your procedure is scheduled on:  Wed, Feb 20  Report to [facility] at 0630 AM.  Call this number if you have problems the morning of surgery: [phone]   Remember:   Do not eat food:After Midnight.  May have clear liquids: up to 4 Hours before arrival.  Clear liquids include soda, tea, black coffee, apple or grape juice, broth.  Take these medicines the morning of surgery with A SIP OF WATER: *Omeprazole,Flomax,Zoloft**   Do not wear jewelry, make-up or nail polish.  Do not wear lotions, powders, or perfumes. You may wear deodorant.  Do not shave 48 hours prior to surgery.  Do not bring valuables to the hospital.  Contacts, dentures or bridgework may not be worn into surgery.  Leave suitcase in the car. After surgery it may be brought to your room.  For patients admitted to the hospital, checkout time is 11:00 AM the day of discharge.   Patients discharged the day of surgery will not be allowed to drive home.  Name and phone number of your driver: *NA**  Special Instructions: CHG Shower Use Special Wash: 1/2 bottle night before surgery and 1/2 bottle morning of surgery.   Please read over the following fact sheets that you were given: Pain Booklet, Coughing and Deep Breathing, MRSA Information and Surgical Site Infection Prevention

## 2011-06-09 ENCOUNTER — Encounter (HOSPITAL_COMMUNITY): Payer: Self-pay | Admitting: *Deleted

## 2011-06-09 ENCOUNTER — Inpatient Hospital Stay (HOSPITAL_COMMUNITY): Payer: Medicare Other

## 2011-06-09 ENCOUNTER — Encounter (HOSPITAL_COMMUNITY): Payer: Self-pay | Admitting: Certified Registered"

## 2011-06-09 ENCOUNTER — Encounter (HOSPITAL_COMMUNITY): Payer: Self-pay | Admitting: General Practice

## 2011-06-09 ENCOUNTER — Inpatient Hospital Stay (HOSPITAL_COMMUNITY): Payer: Medicare Other | Admitting: Certified Registered"

## 2011-06-09 ENCOUNTER — Encounter (HOSPITAL_COMMUNITY)
Admission: RE | Disposition: A | Discharge status: Home / Self Care | Payer: Self-pay | Source: Ambulatory Visit | Attending: Neurosurgery

## 2011-06-09 ENCOUNTER — Inpatient Hospital Stay (HOSPITAL_COMMUNITY)
Admission: RE | Admit: 2011-06-09 | Discharge: 2011-06-11 | DRG: 473 | Disposition: A | Discharge status: Home / Self Care | Payer: Medicare Other | Source: Ambulatory Visit | Attending: Neurosurgery | Admitting: Neurosurgery

## 2011-06-09 DIAGNOSIS — M4712 Other spondylosis with myelopathy, cervical region: Principal | ICD-10-CM | POA: Diagnosis present

## 2011-06-09 DIAGNOSIS — Z01812 Encounter for preprocedural laboratory examination: Secondary | ICD-10-CM

## 2011-06-09 DIAGNOSIS — Z7982 Long term (current) use of aspirin: Secondary | ICD-10-CM

## 2011-06-09 DIAGNOSIS — Z981 Arthrodesis status: Secondary | ICD-10-CM

## 2011-06-09 DIAGNOSIS — M4802 Spinal stenosis, cervical region: Secondary | ICD-10-CM | POA: Diagnosis present

## 2011-06-09 HISTORY — PX: POSTERIOR CERVICAL FUSION/FORAMINOTOMY: SHX5038

## 2011-06-09 LAB — HEMOGLOBIN AND HEMATOCRIT, BLOOD: HCT: 38.8 % — ABNORMAL LOW (ref 39.0–52.0)

## 2011-06-09 SURGERY — POSTERIOR CERVICAL FUSION/FORAMINOTOMY LEVEL 4
Anesthesia: General | Site: Neck | Wound class: Clean

## 2011-06-09 MED ORDER — HYDROMORPHONE HCL PF 1 MG/ML IJ SOLN
INTRAMUSCULAR | Status: AC
  Filled 2011-06-09: qty 1

## 2011-06-09 MED ORDER — LIDOCAINE HCL 4 % MT SOLN
OROMUCOSAL | Status: DC | PRN
  Administered 2011-06-09: 4 mL via TOPICAL

## 2011-06-09 MED ORDER — ONDANSETRON HCL 4 MG/2ML IJ SOLN: 4.0000 mg | INTRAMUSCULAR | Status: DC | PRN

## 2011-06-09 MED ORDER — PHENOL 1.4 % MT LIQD: 1.0000 | OROMUCOSAL | Status: DC | PRN

## 2011-06-09 MED ORDER — PROPOFOL 10 MG/ML IV EMUL
INTRAVENOUS | Status: DC | PRN
  Administered 2011-06-09: 120 mg via INTRAVENOUS

## 2011-06-09 MED ORDER — LIDOCAINE HCL (CARDIAC) 20 MG/ML IV SOLN
INTRAVENOUS | Status: DC | PRN
  Administered 2011-06-09: 100 mg via INTRAVENOUS

## 2011-06-09 MED ORDER — BUPIVACAINE HCL (PF) 0.25 % IJ SOLN
INTRAMUSCULAR | Status: DC | PRN
  Administered 2011-06-09: 8 mL

## 2011-06-09 MED ORDER — DROPERIDOL 2.5 MG/ML IJ SOLN
INTRAMUSCULAR | Status: DC | PRN
  Administered 2011-06-09: 0.625 mg via INTRAVENOUS

## 2011-06-09 MED ORDER — TAMSULOSIN HCL 0.4 MG PO CAPS
0.4000 mg | ORAL_CAPSULE | Freq: Two times a day (BID) | ORAL | Status: DC
  Administered 2011-06-09 – 2011-06-11 (×4): 0.4 mg via ORAL
  Filled 2011-06-09 (×5): qty 1

## 2011-06-09 MED ORDER — GLYCOPYRROLATE 0.2 MG/ML IJ SOLN
INTRAMUSCULAR | Status: DC | PRN
  Administered 2011-06-09: .4 mg via INTRAVENOUS

## 2011-06-09 MED ORDER — ACETAMINOPHEN 650 MG RE SUPP: 650.0000 mg | RECTAL | Status: DC | PRN

## 2011-06-09 MED ORDER — NIACIN 500 MG PO TABS
500.0000 mg | ORAL_TABLET | Freq: Every day | ORAL | Status: DC
Start: 2011-06-10 — End: 2011-06-11
  Administered 2011-06-10 – 2011-06-11 (×2): 500 mg via ORAL
  Filled 2011-06-09 (×3): qty 1

## 2011-06-09 MED ORDER — DOCUSATE SODIUM 100 MG PO CAPS
100.0000 mg | ORAL_CAPSULE | Freq: Two times a day (BID) | ORAL | Status: DC
  Administered 2011-06-09 – 2011-06-11 (×4): 100 mg via ORAL
  Filled 2011-06-09 (×5): qty 1

## 2011-06-09 MED ORDER — NEOSTIGMINE METHYLSULFATE 1 MG/ML IJ SOLN
INTRAMUSCULAR | Status: DC | PRN
  Administered 2011-06-09: 3 mg via INTRAVENOUS

## 2011-06-09 MED ORDER — EPHEDRINE SULFATE 50 MG/ML IJ SOLN
INTRAMUSCULAR | Status: DC | PRN
  Administered 2011-06-09 (×3): 10 mg via INTRAVENOUS

## 2011-06-09 MED ORDER — SUFENTANIL CITRATE 50 MCG/ML IV SOLN
INTRAVENOUS | Status: DC | PRN
  Administered 2011-06-09: 20 ug via INTRAVENOUS
  Administered 2011-06-09: 10 ug via INTRAVENOUS

## 2011-06-09 MED ORDER — ACETAMINOPHEN 325 MG PO TABS: 650.0000 mg | ORAL_TABLET | ORAL | Status: DC | PRN

## 2011-06-09 MED ORDER — HEMOSTATIC AGENTS (NO CHARGE) OPTIME
TOPICAL | Status: DC | PRN
  Administered 2011-06-09: 1 via TOPICAL

## 2011-06-09 MED ORDER — SODIUM CHLORIDE 0.9 % IJ SOLN: 3.0000 mL | INTRAMUSCULAR | Status: DC | PRN

## 2011-06-09 MED ORDER — SODIUM CHLORIDE 0.9 % IJ SOLN
3.0000 mL | Freq: Two times a day (BID) | INTRAMUSCULAR | Status: DC
  Administered 2011-06-09 (×2): 3 mL via INTRAVENOUS

## 2011-06-09 MED ORDER — LACTATED RINGERS IV SOLN
INTRAVENOUS | Status: DC | PRN
  Administered 2011-06-09 (×3): via INTRAVENOUS

## 2011-06-09 MED ORDER — THROMBIN 20000 UNITS EX KIT
PACK | CUTANEOUS | Status: DC | PRN
  Administered 2011-06-09: 09:00:00 via TOPICAL

## 2011-06-09 MED ORDER — CEFAZOLIN SODIUM-DEXTROSE 2-3 GM-% IV SOLR
INTRAVENOUS | Status: AC
  Filled 2011-06-09: qty 50

## 2011-06-09 MED ORDER — PANTOPRAZOLE SODIUM 40 MG PO TBEC: 40.0000 mg | DELAYED_RELEASE_TABLET | Freq: Every day | ORAL | Status: DC

## 2011-06-09 MED ORDER — OXYCODONE-ACETAMINOPHEN 5-325 MG PO TABS
1.0000 | ORAL_TABLET | ORAL | Status: DC | PRN
  Administered 2011-06-09: 1 via ORAL
  Filled 2011-06-09: qty 1

## 2011-06-09 MED ORDER — BACITRACIN 50000 UNITS IM SOLR
INTRAMUSCULAR | Status: AC
  Filled 2011-06-09: qty 1

## 2011-06-09 MED ORDER — PANTOPRAZOLE SODIUM 40 MG IV SOLR
40.0000 mg | Freq: Every day | INTRAVENOUS | Status: DC
  Administered 2011-06-09: 40 mg via INTRAVENOUS
  Filled 2011-06-09 (×2): qty 40

## 2011-06-09 MED ORDER — ROCURONIUM BROMIDE 100 MG/10ML IV SOLN
INTRAVENOUS | Status: DC | PRN
  Administered 2011-06-09: 20 mg via INTRAVENOUS
  Administered 2011-06-09: 50 mg via INTRAVENOUS

## 2011-06-09 MED ORDER — SODIUM CHLORIDE 0.9 % IV SOLN: 250.0000 mL | INTRAVENOUS | Status: DC

## 2011-06-09 MED ORDER — MIDAZOLAM HCL 5 MG/5ML IJ SOLN
INTRAMUSCULAR | Status: DC | PRN
  Administered 2011-06-09: 2 mg via INTRAVENOUS

## 2011-06-09 MED ORDER — OXYCODONE HCL 5 MG PO TABS
5.0000 mg | ORAL_TABLET | ORAL | Status: DC | PRN
  Administered 2011-06-09: 5 mg via ORAL
  Filled 2011-06-09: qty 1

## 2011-06-09 MED ORDER — HYDROCHLOROTHIAZIDE 25 MG PO TABS
25.0000 mg | ORAL_TABLET | Freq: Every day | ORAL | Status: DC
  Administered 2011-06-10: 25 mg via ORAL
  Filled 2011-06-09 (×3): qty 1

## 2011-06-09 MED ORDER — SODIUM CHLORIDE 0.9 % IV SOLN
INTRAVENOUS | Status: AC
  Filled 2011-06-09: qty 500

## 2011-06-09 MED ORDER — DEXAMETHASONE SODIUM PHOSPHATE 4 MG/ML IJ SOLN
INTRAMUSCULAR | Status: DC | PRN
  Administered 2011-06-09: 10 mg via INTRAVENOUS

## 2011-06-09 MED ORDER — HYDROMORPHONE HCL PF 1 MG/ML IJ SOLN
0.2500 mg | INTRAMUSCULAR | Status: DC | PRN
Start: 2011-06-09 — End: 2011-06-09
  Administered 2011-06-09: 0.5 mg via INTRAVENOUS

## 2011-06-09 MED ORDER — THROMBIN 5000 UNITS EX SOLR
OROMUCOSAL | Status: DC | PRN
  Administered 2011-06-09: 11:00:00 via TOPICAL

## 2011-06-09 MED ORDER — ONDANSETRON HCL 4 MG/2ML IJ SOLN: 4.0000 mg | Freq: Once | INTRAMUSCULAR | Status: DC | PRN

## 2011-06-09 MED ORDER — OXYCODONE-ACETAMINOPHEN 10-325 MG PO TABS: 1.0000 | ORAL_TABLET | ORAL | Status: DC | PRN

## 2011-06-09 MED ORDER — SODIUM CHLORIDE 0.9 % IR SOLN
Status: DC | PRN
  Administered 2011-06-09: 09:00:00

## 2011-06-09 MED ORDER — CYCLOBENZAPRINE HCL 10 MG PO TABS
10.0000 mg | ORAL_TABLET | Freq: Three times a day (TID) | ORAL | Status: DC | PRN
  Administered 2011-06-09 – 2011-06-10 (×4): 10 mg via ORAL
  Filled 2011-06-09 (×3): qty 1

## 2011-06-09 MED ORDER — ONDANSETRON HCL 4 MG/2ML IJ SOLN
INTRAMUSCULAR | Status: DC | PRN
  Administered 2011-06-09: 4 mg via INTRAVENOUS

## 2011-06-09 MED ORDER — HYDROMORPHONE HCL 2 MG PO TABS
4.0000 mg | ORAL_TABLET | ORAL | Status: DC | PRN
  Administered 2011-06-09: 2 mg via ORAL
  Administered 2011-06-10 (×2): 4 mg via ORAL
  Administered 2011-06-10: 2 mg via ORAL
  Administered 2011-06-11 (×2): 4 mg via ORAL
  Administered 2011-06-11: 2 mg via ORAL
  Filled 2011-06-09: qty 2
  Filled 2011-06-09: qty 1
  Filled 2011-06-09 (×3): qty 2
  Filled 2011-06-09 (×3): qty 1

## 2011-06-09 MED ORDER — CEFAZOLIN SODIUM 1-5 GM-% IV SOLN
1.0000 g | Freq: Three times a day (TID) | INTRAVENOUS | Status: AC
  Administered 2011-06-09 – 2011-06-10 (×2): 1 g via INTRAVENOUS
  Filled 2011-06-09 (×2): qty 50

## 2011-06-09 MED ORDER — SERTRALINE HCL 25 MG PO TABS
25.0000 mg | ORAL_TABLET | Freq: Every day | ORAL | Status: DC
  Administered 2011-06-10 – 2011-06-11 (×2): 25 mg via ORAL
  Filled 2011-06-09 (×3): qty 1

## 2011-06-09 MED ORDER — CEFAZOLIN SODIUM 1-5 GM-% IV SOLN
INTRAVENOUS | Status: DC | PRN
  Administered 2011-06-09: 2 g via INTRAVENOUS

## 2011-06-09 MED ORDER — LIDOCAINE-EPINEPHRINE 1 %-1:100000 IJ SOLN
INTRAMUSCULAR | Status: DC | PRN
  Administered 2011-06-09 (×2): 10 mL

## 2011-06-09 MED ORDER — 0.9 % SODIUM CHLORIDE (POUR BTL) OPTIME
TOPICAL | Status: DC | PRN
  Administered 2011-06-09: 1000 mL

## 2011-06-09 MED ORDER — MENTHOL 3 MG MT LOZG: 1.0000 | LOZENGE | OROMUCOSAL | Status: DC | PRN

## 2011-06-09 SURGICAL SUPPLY — 82 items
ADH SKN CLS APL DERMABOND .7 (GAUZE/BANDAGES/DRESSINGS) ×2
APL SKNCLS STERI-STRIP NONHPOA (GAUZE/BANDAGES/DRESSINGS) ×2
BAG DECANTER FOR FLEXI CONT (MISCELLANEOUS) ×2 IMPLANT
BANDAGE GAUZE ELAST BULKY 4 IN (GAUZE/BANDAGES/DRESSINGS) ×1 IMPLANT
BENZOIN TINCTURE PRP APPL 2/3 (GAUZE/BANDAGES/DRESSINGS) ×4 IMPLANT
BIT DRILL 2.4XNS REUSE 3.5X (BIT) IMPLANT
BIT DRL 2.4XNS REUSE 3.5X (BIT) ×1
BLADE SURG 11 STRL SS (BLADE) ×2 IMPLANT
BLADE SURG ROTATE 9660 (MISCELLANEOUS) ×2 IMPLANT
BUR MATCHSTICK NEURO 3.0 LAGG (BURR) ×2 IMPLANT
CANISTER SUCTION 2500CC (MISCELLANEOUS) ×2 IMPLANT
CAP ELLIPSE LOCKING (Cap) ×10 IMPLANT
CLOTH BEACON ORANGE TIMEOUT ST (SAFETY) ×2 IMPLANT
CONT SPEC 4OZ CLIKSEAL STRL BL (MISCELLANEOUS) ×3 IMPLANT
DECANTER SPIKE VIAL GLASS SM (MISCELLANEOUS) ×2 IMPLANT
DERMABOND ADVANCED (GAUZE/BANDAGES/DRESSINGS) ×2
DERMABOND ADVANCED .7 DNX12 (GAUZE/BANDAGES/DRESSINGS) ×1 IMPLANT
DRAPE C-ARM 42X72 X-RAY (DRAPES) ×2 IMPLANT
DRAPE LAPAROTOMY 100X72 PEDS (DRAPES) ×2 IMPLANT
DRAPE MICROSCOPE ZEISS OPMI (DRAPES) IMPLANT
DRAPE POUCH INSTRU U-SHP 10X18 (DRAPES) ×2 IMPLANT
DRAPE SURG 17X23 STRL (DRAPES) ×8 IMPLANT
DRILL BIT 3.5 (BIT) ×2
DRSG OPSITE 4X5.5 SM (GAUZE/BANDAGES/DRESSINGS) ×5 IMPLANT
ELECT BLADE 4.0 EZ CLEAN MEGAD (MISCELLANEOUS) ×2
ELECT REM PT RETURN 9FT ADLT (ELECTROSURGICAL) ×2
ELECTRODE BLDE 4.0 EZ CLN MEGD (MISCELLANEOUS) IMPLANT
ELECTRODE REM PT RTRN 9FT ADLT (ELECTROSURGICAL) ×1 IMPLANT
EVACUATOR 1/8 PVC DRAIN (DRAIN) ×2 IMPLANT
GAUZE SPONGE 4X4 12PLY STRL LF (GAUZE/BANDAGES/DRESSINGS) ×2 IMPLANT
GAUZE SPONGE 4X4 16PLY XRAY LF (GAUZE/BANDAGES/DRESSINGS) ×2 IMPLANT
GLOVE BIO SURGEON STRL SZ8 (GLOVE) ×2 IMPLANT
GLOVE BIOGEL PI IND STRL 7.0 (GLOVE) IMPLANT
GLOVE BIOGEL PI IND STRL 8 (GLOVE) IMPLANT
GLOVE BIOGEL PI INDICATOR 7.0 (GLOVE) ×2
GLOVE BIOGEL PI INDICATOR 8 (GLOVE) ×1
GLOVE ECLIPSE 6.5 STRL STRAW (GLOVE) ×1 IMPLANT
GLOVE ECLIPSE 7.5 STRL STRAW (GLOVE) ×1 IMPLANT
GLOVE EXAM NITRILE LRG STRL (GLOVE) IMPLANT
GLOVE EXAM NITRILE MD LF STRL (GLOVE) ×2 IMPLANT
GLOVE EXAM NITRILE XL STR (GLOVE) IMPLANT
GLOVE EXAM NITRILE XS STR PU (GLOVE) IMPLANT
GLOVE INDICATOR 8.5 STRL (GLOVE) ×2 IMPLANT
GLOVE SS BIOGEL STRL SZ 6.5 (GLOVE) IMPLANT
GLOVE SUPERSENSE BIOGEL SZ 6.5 (GLOVE) ×4
GOWN BRE IMP SLV AUR LG STRL (GOWN DISPOSABLE) ×4 IMPLANT
GOWN BRE IMP SLV AUR XL STRL (GOWN DISPOSABLE) ×2 IMPLANT
GOWN STRL REIN 2XL LVL4 (GOWN DISPOSABLE) ×1 IMPLANT
HEMOSTAT POWDER KIT SURGIFOAM (HEMOSTASIS) ×1 IMPLANT
KIT BASIN OR (CUSTOM PROCEDURE TRAY) ×2 IMPLANT
KIT INFUSE XX SMALL 0.7CC (Orthopedic Implant) ×1 IMPLANT
KIT ROOM TURNOVER OR (KITS) ×2 IMPLANT
MARKER SKIN DUAL TIP RULER LAB (MISCELLANEOUS) ×2 IMPLANT
MASTERGRAFT STRIP 10CM ×2 IMPLANT
MATRIX MASTERGRAFT STRIP 10CM IMPLANT
NDL HYPO 25X1 1.5 SAFETY (NEEDLE) ×1 IMPLANT
NDL SPNL 20GX3.5 QUINCKE YW (NEEDLE) ×1 IMPLANT
NEEDLE HYPO 25X1 1.5 SAFETY (NEEDLE) ×2 IMPLANT
NEEDLE SPNL 20GX3.5 QUINCKE YW (NEEDLE) ×2 IMPLANT
NS IRRIG 1000ML POUR BTL (IV SOLUTION) ×2 IMPLANT
PACK LAMINECTOMY NEURO (CUSTOM PROCEDURE TRAY) ×2 IMPLANT
PAD ARMBOARD 7.5X6 YLW CONV (MISCELLANEOUS) ×6 IMPLANT
PATTIES SURGICAL .5 X.5 (GAUZE/BANDAGES/DRESSINGS) ×1 IMPLANT
PIN MAYFIELD SKULL DISP (PIN) ×2 IMPLANT
ROD SPINE POST 3.5X80 (Rod) ×2 IMPLANT
RUBBERBAND STERILE (MISCELLANEOUS) IMPLANT
SCREW 3.5X12MM (Screw) ×9 IMPLANT
SCREW POLYAXIAL ELLIPSE 4X12 (Screw) ×1 IMPLANT
SPONGE LAP 4X18 X RAY DECT (DISPOSABLE) IMPLANT
SPONGE SURGIFOAM ABS GEL 100 (HEMOSTASIS) ×3 IMPLANT
STAPLER SKIN PROX WIDE 3.9 (STAPLE) ×1 IMPLANT
STRIP CLOSURE SKIN 1/2X4 (GAUZE/BANDAGES/DRESSINGS) ×4 IMPLANT
SUT ETHILON 4 0 PS 2 18 (SUTURE) IMPLANT
SUT VIC AB 0 CT1 18XCR BRD8 (SUTURE) ×1 IMPLANT
SUT VIC AB 0 CT1 8-18 (SUTURE) ×4
SUT VIC AB 2-0 CT1 18 (SUTURE) ×3 IMPLANT
SUT VICRYL 4-0 PS2 18IN ABS (SUTURE) ×3 IMPLANT
SYR 20ML ECCENTRIC (SYRINGE) ×2 IMPLANT
TOWEL OR 17X24 6PK STRL BLUE (TOWEL DISPOSABLE) ×2 IMPLANT
TOWEL OR 17X26 10 PK STRL BLUE (TOWEL DISPOSABLE) ×2 IMPLANT
TRAY FOLEY CATH 14FRSI W/METER (CATHETERS) ×1 IMPLANT
WATER STERILE IRR 1000ML POUR (IV SOLUTION) ×2 IMPLANT

## 2011-06-09 NOTE — Progress Notes (Signed)
Orthopedic Tech Progress Note Patient Details:  Evan [name] Sep 22, 1940 [id number]  Other Ortho Devices Type of Ortho Device: Other (comment) Ortho Device Location: delivered cervical collar Ortho Device Interventions: Other (comment)   Gaye Pollack 06/09/2011, 10:01 AM

## 2011-06-09 NOTE — H&P (Signed)
Evan [name] is an 71 y.o. male.   Chief Complaint: Neck pain HPI: Patient is very pleasant 71 year old woman has undergone 2 previous anterior cervical discectomies and fusion from C5-C7 and C3-C5. 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 His neck pain has gotten progressively worse and her fractured all forms of conservative treatment so we recommended posterior cervical fusion with iliac crest bone graft from C3 down to C7 have extensively reviewed the risks and benefits of the operation with him as well as perioperative course and expectations of outcome and alternatives of surgery he understands and agrees to proceed forward. The morning of admission he did experience a little bleeding from his hemorrhagic right this is stopped now we're checking a followup hemoglobin and hematocrit aorta schedule outpatient colonoscopy does have 5 or 6 years since he had one.  Past Medical History  Diagnosis Date  . GERD (gastroesophageal reflux disease)   . Depression   . BPH (benign prostatic hyperplasia)   . Migraine   . COPD (chronic obstructive pulmonary disease)   . Arthritis     spine  . Gout, arthritis     feet  . Hypertension   . Shortness of breath     exertional  . Diverticular disease of colon   . Hard of hearing     does not wear hearing aides    Past Surgical History  Procedure Date  . Neck surgery 2009    Cervical Fusion x3  . Ulnar nerve repair 2010  . Tonsillectomy   . Cholecystectomy 10+ years ago  . Cardiovascular stress test 18 YEARS ago  . Cervical fusion     x 5  . Back surgery   . Lumbar fusion 2011  . Hernia repair      History reviewed. No pertinent family history. Social History:  reports that he has been smoking Cigarettes.  He has a 75 pack-year smoking history. He uses smokeless tobacco. He reports that he does not drink alcohol or use illicit drugs.  Allergies:  Allergies  Allergen Reactions  . Cephalosporins Other (See Comments)    Oral thrush    No current facility-administered medications on file as of 06/09/2011.   Medications Prior to Admission  Medication Sig Dispense Refill  . Aspirin-Caffeine 845-65 MG PACK Take 1-2 packets by mouth daily as needed. For neck pain       . Fiber TABS Take 1 tablet by mouth daily.        . fish oil-omega-3 fatty acids 1000 MG capsule Take 1 g by mouth daily.        . hydrochlorothiazide (HYDRODIURIL) 25 MG tablet Take 25 mg by mouth daily.        . niacin 500 MG tablet Take 500 mg by mouth daily with breakfast.        . omeprazole (PRILOSEC) 20 MG capsule Take 20 mg by mouth daily.        Marland Kitchen OVER THE COUNTER MEDICATION Take 1 tablet by mouth daily. African Mango Vitamin Supplement       . sertraline (ZOLOFT) 25 MG tablet Take 25 mg by mouth daily.        . Tamsulosin HCl (  FLOMAX) 0.4 MG CAPS Take 0.4 mg by mouth 2 (two) times daily.          No results found for this or any previous visit (from the past 48 hour(s)). No results found.  Review of Systems  Constitutional: Negative.   HENT: Positive for neck pain.   Eyes: Negative.   Respiratory: Negative.   Cardiovascular: Negative.   Gastrointestinal: Positive for blood in stool.  Genitourinary: Negative.   Musculoskeletal: Positive for myalgias, back pain and falls.  Skin: Negative.   Neurological: Positive for tingling.    Blood pressure 112/75, pulse 67, temperature 97.4 F (36.3 C), temperature source Oral, resp. rate 18, height 5\' 7"  (1.702 m), weight 83.915 kg (185 lb), SpO2 94.00%. Physical Exam  Constitutional: He is oriented to person, place, and time. He appears well-developed.   HENT:  Head: Normocephalic.  Eyes: Pupils are equal, round, and reactive to light.  Cardiovascular: Normal rate.   Respiratory: Breath sounds normal.  GI: Soft.  Neurological: He is alert and oriented to person, place, and time. GCS eye subscore is 4. GCS verbal subscore is 5. GCS motor subscore is 6.  Reflex Scores:      Patellar reflexes are 0 on the right side and 0 on the left side.      Achilles reflexes are 0 on the right side and 0 on the left side.      Patient is 5 out of 5 strength in his upper extremities and his deltoids biceps triceps are weak at 4-4+ out of 5 wrist flexion extension and intrinsics are all 5 out of 5. Lower extremity strength is 5 out of 5.     Assessment/Plan 71 year old gentleman blood presents for a C3-C7 posterior cervical fusion with of the operation and expectations of outcome alternatives of surgery and perioperative course were all explained patient he understands and agrees to proceed forward.  Evan [name] P 06/09/2011, 8:03 AM

## 2011-06-09 NOTE — Transfer of Care (Cosign Needed)
Immediate Anesthesia Transfer of Care Note  Patient: Evan [name]  Procedure(s) Performed: Procedure(s) (LRB): POSTERIOR CERVICAL FUSION/FORAMINOTOMY LEVEL 4 (N/A)  Patient Location: PACU  Anesthesia Type: General  Level of Consciousness: awake, oriented, sedated, patient cooperative and responds to stimulation  Airway & Oxygen Therapy: Patient Spontanous Breathing and Patient connected to nasal cannula oxygen  Post-op Assessment: Report given to PACU RN, Post -op Vital signs reviewed and stable and Patient moving all extremities  Post vital signs: Reviewed and stable  Complications: No apparent anesthesia complications

## 2011-06-09 NOTE — Anesthesia Postprocedure Evaluation (Signed)
  Anesthesia Post-op Note  Patient: Evan [name]  Procedure(s) Performed: Procedure(s) (LRB): POSTERIOR CERVICAL FUSION/FORAMINOTOMY LEVEL 4 (N/A)  Patient Location: PACU  Anesthesia Type: General  Level of Consciousness: awake, alert  and oriented  Airway and Oxygen Therapy: Patient Spontanous Breathing  Post-op Pain: mild  Post-op Assessment: Post-op Vital signs reviewed and Patient's Cardiovascular Status Stable  Post-op Vital Signs: stable  Complications: No apparent anesthesia complications

## 2011-06-09 NOTE — Progress Notes (Signed)
Spoke with Dr. Wynetta Emery about Patient's hemorrhoids bleeding this am with BM and that it seems to have stopped now.  Reported that I spoke with Dr. Arta Bruce earlier and he ordered Hemoglobin. Dr. Wynetta Emery states do H&H and send Patient upstairs to OR for further evaluation prn.

## 2011-06-09 NOTE — Op Note (Signed)
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  Surgeon: Jillyn Hidden Kimara Bencomo  Assistant: Shirlean Kelly  Anesthesia: Gen.  EBL: 200  History of present illness: Patient is a 71 year old gentleman has had long-standing neck pain as well as cervical myelopathy he previously undergone into cervical discectomies and fusion from C3 down to C7 however develop pseudoarthroses at multiple levels and persistent posterior stenosis at C4-5. Patient failed all forms of conservative treatment a progression of clinical syndrome imaging findings was recommended as a cervical laminectomy of C4-5 and posterior cervical fusion from C3-C7. As benefits of the operation perioperative course and expectations of outcome alternatives surgery rough the patient he understood and agreed to proceed forward.  Operative procedure: Patient brought into the or was induced under general anesthesia positioned prone in pins on chest rolls back of his neck and the posterior aspect of his right iliac crest were exposed prepped and draped in routine sterile fashion. Attention was first taken harvesting of iliac crest so after infiltration 10 cc lidocaine with epi incision was made centering over the posterior suprailiac spine 4 fingerbreadths off midline. Dissection is carried down to the crest of the posterior iliac in the posterior aspect of the leg was dissected free retractor was placed and then using a chisel the outer posterior thickness cortical section was removed and then cancellus bone was harvested with gouges to after an adequate amount of graft and obtain Gelfoam was overlaid top of the defect  and the wounds closed with 2 layers with a with interrupted Vicryl and running 4 subcuticular. Benzoin and Steri-Strips were applied Dermabond that wound was dressed incision attention second posterior cervical incision. After a position of 10 cc lidocaine with epi incision was made extending from just inferior the inion down to be just below the C7 spinous process. Subperiosteal dissections carried lamina from C2 down to T1 lateral masses were exposed from C3-C7. After adequate dissection been achieved immediately visualized was disruption of the C4-5 inner spinous ligament and now pilot holes were drilled on the inferomedial aspect the lateral masses at all levels and then totally drill bit was placed and the drill holes were drilled in and from inferomedial quadrants of the lateral mass. The lateral mass holes were probed and neocortex a stab and 12 mm screws were at all levels. The left-sided C5 screw stripped in a rescue screw was placed in a rescue screw had good purchase. All screws in place a similar fashion ultimately oh screws excellent purchase at this point was taken decompression process C4 to move laminotomy was begun with a 2 mm Kerrison punch in a marching soup into the supra-acetabular C5 lamina is with her thumbs of the stenosis was and after removing the supra-acetabular limited or was visually decompressed. After adequate area been decompressed this was a to proceed her get meticulous in 6 wasn't intact Gelfoam. Now the lateral masses and facet complexes from C3-C7 were aggressively decorticated iliac crest was overlaid within the facet overlaid top of the facet bone BMP sponges were overlaid top of the iliac crest and master graft was overlaid top of the BMP. Then 2 rods were contoured and nuts were applied and top tightness tightened down at all levels. The construct  was inspected and felt to be solid fixation space was maintained a medium Hemovac drain was placed and the wounds closed in  layers with interrupted Vicryl and a running 4 subcuticular to patient recovered in stable condition. At the case all needle counts sponge counts are correct per the nurses.

## 2011-06-09 NOTE — Anesthesia Preprocedure Evaluation (Signed)
Anesthesia Evaluation  Patient identified by MRN, date of birth, ID band Patient awake and Patient confused    Reviewed: Allergy & Precautions, H&P , NPO status   Airway Mallampati: II  Neck ROM: Limited    Dental  (+) Edentulous Upper and Edentulous Lower   Pulmonary  clear to auscultation        Cardiovascular Regular Normal    Neuro/Psych    GI/Hepatic   Endo/Other    Renal/GU      Musculoskeletal   Abdominal (+) obese,   Peds  Hematology   Anesthesia Other Findings   Reproductive/Obstetrics                           Anesthesia Physical Anesthesia Plan  ASA: III  Anesthesia Plan: General   Post-op Pain Management:    Induction: Intravenous  Airway Management Planned: Oral ETT  Additional Equipment:   Intra-op Plan:   Post-operative Plan: Extubation in OR  Informed Consent: I have reviewed the patients History and Physical, chart, labs and discussed the procedure including the risks, benefits and alternatives for the proposed anesthesia with the patient or authorized representative who has indicated his/her understanding and acceptance.     Plan Discussed with:   Anesthesia Plan Comments: (Cervical Spondylosis  Smoker GERD  Plan GA)        Anesthesia Quick Evaluation

## 2011-06-10 MED ORDER — PANTOPRAZOLE SODIUM 40 MG PO TBEC
40.0000 mg | DELAYED_RELEASE_TABLET | Freq: Every day | ORAL | Status: DC
  Administered 2011-06-10 – 2011-06-11 (×2): 40 mg via ORAL
  Filled 2011-06-10 (×2): qty 1

## 2011-06-10 NOTE — Progress Notes (Signed)
UR COMPLETED  

## 2011-06-10 NOTE — Progress Notes (Signed)
Subjective: Patient reports As these are well this morning neck pain is not too bad he feels no pain around the hip incision arms and hands feel better he now being breakfast the  Objective: Vital signs in last 24 hours: Temp:  [97.5 F (36.4 C)-100.1 F (37.8 C)] 98.2 F (36.8 C) (02/21 1400) Pulse Rate:  [67-81] 79  (02/21 1400) Resp:  [18-21] 18  (02/21 1400) BP: (93-108)/(50-74) 108/74 mmHg (02/21 1400) SpO2:  [92 %-98 %] 92 % (02/21 1400)  Intake/Output from previous day: 02/20 0701 - 02/21 0700 In: 3530 [P.O.:360; I.V.:2700; IV Piggyback:50] Out: 2775 [Urine:2375; Drains:200; Blood:200] Intake/Output this shift: Total I/O In: 155 [Other:155] Out: 750 [Urine:750]  Strength 5 out of 5 wound is clean and dry  Lab Results:  Basename 06/09/11 0711  WBC --  HGB 13.3  HCT 38.8*  PLT --   BMET No results found for this basename: NA:2,K:2,CL:2,CO2:2,GLUCOSE:2,BUN:2,CREATININE:2,CALCIUM:2 in the last 72 hours  Studies/Results: Dg Cervical Spine 2-3 Views  06/09/2011  *RADIOLOGY REPORT*  Clinical Data: C3-C7 posterior fusion  CERVICAL SPINE - 2-3 VIEW  Comparison: MRI 06/03/2010  Findings: Two C-arm images show previous ACDF from C3-C5. Posterior fusion is performed from C3-C7 with lateral mass screws and posterior rods.  Sponges remain in place along the operative approach.  IMPRESSION: Posterior fusion from C3-C7.  Original Report Authenticated By: Thomasenia Sales, M.D.    Assessment/Plan: Posterior day 1 her posterior cervical fusion doing very well lysing well drains to put in a little bit too much to just continue and I will plan on discharge home tomorrow.  LOS: 1 day     Evan [name] P 06/10/2011, 6:48 PM

## 2011-06-10 NOTE — Progress Notes (Signed)
Physical Therapy Evaluation Patient Details Name: Evan [name] MRN: [id number] DOB: 01/30/1941 Today's Date: 06/10/2011  Problem List: There is no problem list on file for this patient.   Past Medical History:  Past Medical History  Diagnosis Date  . GERD (gastroesophageal reflux disease)   . Depression   . BPH (benign prostatic hyperplasia)   . Migraine   . COPD (chronic obstructive pulmonary disease)   . Arthritis     spine  . Gout, arthritis     feet  . Hypertension   . Shortness of breath     exertional  . Diverticular disease of colon   . Hard of hearing     does not wear hearing aides   Past Surgical History:  Past Surgical History  Procedure Date  . Neck surgery 2009    Cervical Fusion x3  . Ulnar nerve repair 2010  . Tonsillectomy   . Cholecystectomy 10+ years ago  . Cardiovascular stress test 18 YEARS ago  . Cervical fusion     x 5  . Back surgery   . Lumbar fusion 2011  . Hernia repair     PT Assessment/Plan/Recommendation PT Assessment Clinical Impression Statement: pt rpesents s/p Cervical Fusion.  pt now with new LE symptoms R worse than L.  RN made aware.  pt with history of falls per pt and wife and would benefit from further PT.   PT Recommendation/Assessment: Patient will need skilled PT in the acute care venue PT Problem List: Decreased strength;Decreased activity tolerance;Decreased balance;Decreased mobility;Decreased coordination;Decreased knowledge of use of DME;Decreased knowledge of precautions;Pain Barriers to Discharge: None PT Therapy Diagnosis : Difficulty walking;Acute pain PT Plan PT Frequency: Min 5X/week PT Treatment/Interventions: DME instruction;Gait training;Stair training;Functional mobility training;Therapeutic activities;Therapeutic exercise;Balance training;Neuromuscular re-education;Patient/family education PT Recommendation Follow Up Recommendations: Home health PT;Supervision - Intermittent Equipment  Recommended: Other (comment) (to be determined) PT Goals  Acute Rehab PT Goals PT Goal Formulation: With patient Time For Goal Achievement: 2 weeks Pt will go Supine/Side to Sit: Independently PT Goal: Supine/Side to Sit - Progress: Goal set today Pt will go Sit to Supine/Side: Independently PT Goal: Sit to Supine/Side - Progress: Goal set today Pt will go Sit to Stand: with modified independence;with upper extremity assist PT Goal: Sit to Stand - Progress: Goal set today Pt will Ambulate: >150 feet;with modified independence;with rolling walker PT Goal: Ambulate - Progress: Goal set today Pt will Go Up / Down Stairs: 3-5 stairs;with min assist;with least restrictive assistive device PT Goal: Up/Down Stairs - Progress: Goal set today  PT Evaluation Precautions/Restrictions  Precautions Precautions:  (Cervical) Precaution Comments: Cervical Sheet given Restrictions Weight Bearing Restrictions: No Prior Functioning  Home Living Lives With: Spouse Receives Help From: Family Type of Home: House Home Layout: One level Home Access: Stairs to enter Entrance Stairs-Rails: Right Entrance Stairs-Number of Steps: 5 Bathroom Shower/Tub: Health visitor: Standard Bathroom Accessibility: Yes How Accessible: Accessible via walker Home Adaptive Equipment: Walker - rolling;Wheelchair - powered;Wheelchair - Architectural technologist with back Prior Function Level of Independence: Independent with homemaking with ambulation;Independent with gait;Independent with transfers;Needs assistance with ADLs Dressing: Minimal Able to Take Stairs?: Yes Driving: Yes Vocation: Part time employment Comments: Pt requries min assist to fasten buttons. Cognition Cognition Arousal/Alertness: Awake/alert Overall Cognitive Status: Appears within functional limits for tasks assessed Orientation Level: Oriented X4 Sensation/Coordination Coordination Gross Motor Movements are Fluid and Coordinated:  Yes Fine Motor Movements are Fluid and Coordinated: No (L UE) Extremity Assessment RUE Assessment RUE Assessment: Within  Functional Limits LUE Assessment LUE Assessment: Within Functional Limits (Noticable atrophy of hand on radial side and thenar eminence) RLE Assessment RLE Assessment: Exceptions to [facility] RLE Strength RLE Overall Strength Comments: pt notes R LE WFL prior to surgery and now presents with mild ataxia, toe flexion during stance.  LLE Assessment LLE Assessment: Exceptions to [facility] LLE Strength LLE Overall Strength Comments: pt notes had difficulty with controlling L LE PTA and now still with mild ataxia, but new toe flexion in stance.   Mobility (including Balance) Bed Mobility Bed Mobility: No Transfers Transfers: Yes Sit to Stand: 4: Min assist;From bed;With upper extremity assist Sit to Stand Details (indicate cue type and reason): VC for safe hand placement. Stand to Sit: 4: Min assist;To chair/3-in-1;To bed;With armrests;With upper extremity assist Stand to Sit Details: VC for controlled descent and for safe hand placement. Ambulation/Gait Ambulation/Gait: Yes Ambulation/Gait Assistance: 4: Min assist Ambulation/Gait Assistance Details (indicate cue type and reason): cues for use of RW, upright posture Ambulation Distance (Feet): 150 Feet Assistive device: Rolling walker Gait Pattern: Step-through pattern;Decreased stride length;Trunk flexed;Ataxic (Bil toe flexion with R worse than L, mild ataxia) Stairs: No Wheelchair Mobility Wheelchair Mobility: No  Posture/Postural Control Posture/Postural Control: No significant limitations Balance Balance Assessed: No Exercise    End of Session PT - End of Session Equipment Utilized During Treatment: Gait belt;Cervical collar Activity Tolerance: Patient tolerated treatment well Patient left: in chair;with call bell in reach;with family/visitor present Nurse Communication: Mobility status for transfers;Mobility  status for ambulation General Behavior During Session: [facility] for tasks performed Cognition: [facility] for tasks performed  Evan [name], [place] [phone] 06/10/2011, 10:46 AM

## 2011-06-11 MED ORDER — HYDROMORPHONE HCL 4 MG PO TABS: 4.0000 mg | ORAL_TABLET | ORAL | Status: AC | PRN

## 2011-06-11 MED ORDER — ZOLPIDEM TARTRATE 10 MG PO TABS: 10.0000 mg | ORAL_TABLET | Freq: Every evening | ORAL | Status: DC | PRN

## 2011-06-11 MED ORDER — CYCLOBENZAPRINE HCL 10 MG PO TABS: 10.0000 mg | ORAL_TABLET | Freq: Three times a day (TID) | ORAL | Status: AC | PRN

## 2011-06-11 NOTE — Progress Notes (Signed)
Pt d/c in stable condition. Pt and wife educated on catheter care and urologist appointment already scheduled on 2/27. Instructed on activity progression and s/s of infection.

## 2011-06-11 NOTE — Progress Notes (Signed)
Subjective: Patient reports Is doing well arms and hands feel good no new numbness or tingling pain is well controlled on the oral analgesics  Objective: Vital signs in last 24 hours: Temp:  [97.6 F (36.4 C)-98.2 F (36.8 C)] 97.7 F (36.5 C) (02/22 1000) Pulse Rate:  [70-92] 78  (02/22 1000) Resp:  [18-20] 18  (02/22 1000) BP: (99-123)/(65-78) 99/65 mmHg (02/22 1000) SpO2:  [84 %-97 %] 91 % (02/22 1000)  Intake/Output from previous day: 02/21 0701 - 02/22 0700 In: 155  Out: 1815 [Urine:1700; Drains:115] Intake/Output this shift: Total I/O In: 120 [P.O.:120] Out: 100 [Urine:100]  Strength is 5 out of 5 wound is clean and dry drain output was low and I discontinued drains.  Lab Results:  Basename 06/09/11 0711  WBC --  HGB 13.3  HCT 38.8*  PLT --   BMET No results found for this basename: NA:2,K:2,CL:2,CO2:2,GLUCOSE:2,BUN:2,CREATININE:2,CALCIUM:2 in the last 72 hours  Studies/Results: No results found.  Assessment/Plan: Posterior day 2 from posterior cervical fusion discharged home  LOS: 2 days     Evan [name] P 06/11/2011, 1:33 PM

## 2011-06-11 NOTE — Evaluation (Signed)
Occupational Therapy Evaluation Patient Details Name: Evan [name] MRN: [id number] DOB: 1940-06-03 Today's Date: 06/11/2011  Problem List: There is no problem list on file for this patient.   Past Medical History:  Past Medical History  Diagnosis Date  . GERD (gastroesophageal reflux disease)   . Depression   . BPH (benign prostatic hyperplasia)   . Migraine   . COPD (chronic obstructive pulmonary disease)   . Arthritis     spine  . Gout, arthritis     feet  . Hypertension   . Shortness of breath     exertional  . Diverticular disease of colon   . Hard of hearing     does not wear hearing aides   Past Surgical History:  Past Surgical History  Procedure Date  . Neck surgery 2009    Cervical Fusion x3  . Ulnar nerve repair 2010  . Tonsillectomy   . Cholecystectomy 10+ years ago  . Cardiovascular stress test 18 YEARS ago  . Cervical fusion     x 5  . Back surgery   . Lumbar fusion 2011  . Hernia repair     OT Assessment/Plan/Recommendation   OT Goals    OT Evaluation Precautions/Restrictions  Precautions Precautions:  (Cervical) Precaution Comments: Cervical Sheet given Restrictions Weight Bearing Restrictions: No Prior Functioning     ADL   Vision/Perception    Cognition   Sensation/Coordination   Extremity Assessment   Mobility    Exercises   End of Session     Cipriano Mile 06/11/2011, 9:14 AM

## 2011-06-11 NOTE — Discharge Summary (Signed)
  Physician Discharge Summary  Patient ID: Evan [name] MRN: [id number] DOB/AGE: June 10, 1940 71 y.o.  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 *    Discharged Condition: good  Hospital Course: Patient is a 63 presented to the hospital Wednesday underwent a posterior cervical fusion from C3-C7 for pseudoarthrosis and 3 C3-C7 she also underwent a decompressive laminotomy at C4-5 postoperative patient did very well [employer] room and the floor on the floor his convalesced well and ambulating however he has not been able to void he had had a catheter placed last evening patient's pain seems he well-controlled on the oral pain medication and muscle relaxers the patient be discharged home was scheduled followup approximately one week with urology.  Consults: Significant Diagnostic Studies: Treatments: Posterior cervical fusion C3-C7 decompressive cervical laminectomy C4-5 Discharge Exam: Blood pressure 99/65, pulse 78, temperature 97.7 F (36.5 C), temperature source Oral, resp. rate 18, height 5\' 7"  (1.702 m), weight 83.915 kg (185 lb), SpO2 91.00%. Strength 5 out of 5 wound clean and dry and  Disposition: Home   Medication List  As of 06/11/2011  1:37 PM   TAKE these medications         Aspirin-Caffeine 845-65 MG Pack   Take 1-2 packets by mouth daily as needed. For neck pain      cyclobenzaprine 10 MG tablet   Commonly known as: FLEXERIL   Take 1 tablet (10 mg total) by mouth 3 (three) times daily as needed for muscle spasms.      Fiber Tabs   Take 1 tablet by mouth daily.      fish oil-omega-3 fatty acids 1000 MG capsule   Take 1 g by mouth daily.      hydrochlorothiazide 25 MG tablet   Commonly known as: HYDRODIURIL   Take 25 mg by mouth daily.      HYDROmorphone 4 MG tablet   Commonly known as:  DILAUDID   Take 1 tablet (4 mg total) by mouth every 4 (four) hours as needed.      niacin 500 MG tablet   Take 500 mg by mouth daily with breakfast.      omeprazole 20 MG capsule   Commonly known as: PRILOSEC   Take 20 mg by mouth daily.      OVER THE COUNTER MEDICATION   Take 1 tablet by mouth daily. African Mango Vitamin Supplement      oxyCODONE-acetaminophen 10-325 MG per tablet   Commonly known as: PERCOCET   Take 1 tablet by mouth every 4 (four) hours as needed.      sertraline 25 MG tablet   Commonly known as: ZOLOFT   Take 25 mg by mouth daily.      Tamsulosin HCl 0.4 MG Caps   Commonly known as: FLOMAX   Take 0.4 mg by mouth 2 (two) times daily.           Follow-up Information    Follow up in 1 week.         Signed: Stephens Sh[place] [name] 06/11/2011, 1:37 PM

## 2011-06-11 NOTE — Discharge Instructions (Signed)
No lifting no bending no twisting no driving a riding a car was come back for to see me

## 2011-06-11 NOTE — Evaluation (Signed)
Occupational Therapy Evaluation Patient Details Name: Evan [name] MRN: [id number] DOB: Aug 01, 1940 Today's Date: 06/11/2011  Problem List: There is no problem list on file for this patient.   Past Medical History:  Past Medical History  Diagnosis Date  . GERD (gastroesophageal reflux disease)   . Depression   . BPH (benign prostatic hyperplasia)   . Migraine   . COPD (chronic obstructive pulmonary disease)   . Arthritis     spine  . Gout, arthritis     feet  . Hypertension   . Shortness of breath     exertional  . Diverticular disease of colon   . Hard of hearing     does not wear hearing aides   Past Surgical History:  Past Surgical History  Procedure Date  . Neck surgery 2009    Cervical Fusion x3  . Ulnar nerve repair 2010  . Tonsillectomy   . Cholecystectomy 10+ years ago  . Cardiovascular stress test 18 YEARS ago  . Cervical fusion     x 5  . Back surgery   . Lumbar fusion 2011  . Hernia repair     OT Assessment/Plan/Recommendation OT Assessment Clinical Impression Statement: Pt s/p posterior cervical fusion/foraminotomy level 4 thus affecting PLOF. Will benefit from acute OT to address below problem list in prep for d/c home with spouse. OT Recommendation/Assessment: Patient will need skilled OT in the acute care venue OT Problem List: Decreased activity tolerance;Decreased knowledge of use of DME or AE;Decreased knowledge of precautions;Pain;Decreased coordination;Impaired UE functional use OT Therapy Diagnosis : Generalized weakness;Acute pain OT Plan OT Frequency: Min 2X/week OT Treatment/Interventions: Self-care/ADL training;Therapeutic exercise;DME and/or AE instruction;Therapeutic activities;Patient/family education OT Recommendation Follow Up Recommendations: Home health OT Equipment Recommended: Other (comment) (to be determined) Individuals Consulted Consulted and Agree with Results and Recommendations: Patient OT Goals Acute Rehab OT  Goals OT Goal Formulation: With patient Time For Goal Achievement: 7 days ADL Goals Pt Will Perform Grooming: with modified independence;Standing at sink;Other (comment) (maintaining cervical precautions) ADL Goal: Grooming - Progress: Goal set today Pt Will Perform Lower Body Dressing: with modified independence;Sit to stand from chair;Sit to stand from bed;Other (comment) (maintaining cervical precautions) ADL Goal: Lower Body Dressing - Progress: Goal set today Pt Will Transfer to Toilet: with modified independence;with DME;Ambulation;Regular height toilet;3-in-1;Other (comment) (maintaining cervical precautions) ADL Goal: Toilet Transfer - Progress: Goal set today Pt Will Perform Tub/Shower Transfer: Shower transfer;with modified independence;with DME;Ambulation;Shower seat with back;Other (comment) (maintaining cervical precautions) Arm Goals Pt Will Complete Theraputty Exer: Independently;Left upper extremity;to increase strength;Min resistance putty  OT Evaluation Precautions/Restrictions  Precautions Precautions:  (Cervical) Precaution Comments: Cervical Sheet given Restrictions Weight Bearing Restrictions: No Prior Functioning Home Living Lives With: Spouse Receives Help From: Family Type of Home: House Home Layout: One level Home Access: Stairs to enter Entrance Stairs-Rails: Right Entrance Stairs-Number of Steps: 5 Bathroom Shower/Tub: Health visitor: Standard Bathroom Accessibility: Yes How Accessible: Accessible via walker Home Adaptive Equipment: Walker - rolling;Wheelchair - powered;Wheelchair - Architectural technologist with back Prior Function Level of Independence: Independent with homemaking with ambulation;Independent with gait;Independent with transfers;Needs assistance with ADLs Dressing: Minimal Able to Take Stairs?: Yes Driving: Yes Vocation: Part time employment Comments: Pt required assist to fasten buttons on shirt. ADL ADL Upper Body  Dressing Details (indicate cue type and reason): Min assist for fastening buttons and to thread L UE through sleeve. Where Assessed - Upper Body Dressing: Standing Lower Body Dressing: Performed;Moderate assistance Lower Body Dressing Details (indicate cue type  and reason): Mod assist to thread bil. LE through pants and to pull undergarment/pants up over hips. Where Assessed - Lower Body Dressing: Sit to stand from bed Toilet Transfer: Simulated;Minimal assistance Toilet Transfer Details (indicate cue type and reason): Pt transferred from bed to chair.  VC for safe hand placement and for maneuvering RW. Toilet Transfer Method: Ambulating Toileting - Clothing Manipulation: Simulated;Minimal assistance Where Assessed - Toileting Clothing Manipulation: Other (comment) (sit to stand from bed) Ambulation Related to ADLs: Pt with report of weakness in bil. LE.  Toes curl during ambulation. ADL Comments: Pt's wife provided mod assist to pt during LB dressing. Vision/Perception    Cognition Cognition Arousal/Alertness: Awake/alert Overall Cognitive Status: Appears within functional limits for tasks assessed Orientation Level: Oriented X4 Sensation/Coordination Coordination Gross Motor Movements are Fluid and Coordinated: Yes Fine Motor Movements are Fluid and Coordinated: No (L UE) Extremity Assessment RUE Assessment RUE Assessment: Within Functional Limits LUE Assessment LUE Assessment: Within Functional Limits (Noticable atrophy of hand on radial side and thenar eminence) Mobility  Bed Mobility Bed Mobility: No Transfers Transfers: Yes Sit to Stand: 4: Min assist;From bed;With upper extremity assist Stand to Sit: 4: Min assist;To chair/3-in-1;To bed;With armrests;With upper extremity assist Exercises   End of Session OT - End of Session Equipment Utilized During Treatment: Gait belt;Cervical collar Activity Tolerance: Patient tolerated treatment well Patient left: with call bell in  reach;in chair;with family/visitor present Nurse Communication: Mobility status for transfers;Mobility status for ambulation General Behavior During Session: [facility] for tasks performed Cognition: [facility] for tasks performed   Cipriano Mile 06/11/2011, 9:19 AM   06/11/2011 Cipriano Mile OTR/L Pager [phone] Office [phone]

## 2011-06-11 NOTE — Progress Notes (Signed)
Nursing- pt unable to void after 6 hours from last straight cath. Bladder scan revealed . Foley reinserted per MD order. Pt tolerated well.  fluid returned immediately. Will continue to monitor patient.

## 2011-06-11 NOTE — Plan of Care (Signed)
Problem: Phase III Progression Outcomes Goal: Other Phase III Outcomes/Goals Outcome: Not Met (add Reason) Urinary catheter re-inserted secondary to retention.

## 2011-06-11 NOTE — Progress Notes (Signed)
OT Cancellation Note  Treatment cancelled today due to medical issues with patient which prohibited therapy. Pt having vitals recorded by nurse tech.  Pt's BP was 90/60 with second BP reading at 99/65.  O2 fluctuating between 85-89% on 2L nasal cannula.  Will re-attempt as appropriate this afternoon.  Thanks!  06/11/2011 Cipriano Mile OTR/L Pager [phone] Office [phone]

## 2011-06-14 ENCOUNTER — Encounter (HOSPITAL_COMMUNITY): Payer: Self-pay | Admitting: Neurosurgery

## 2011-09-20 ENCOUNTER — Encounter (INDEPENDENT_AMBULATORY_CARE_PROVIDER_SITE_OTHER): Payer: Self-pay | Admitting: General Surgery

## 2011-09-20 ENCOUNTER — Ambulatory Visit (INDEPENDENT_AMBULATORY_CARE_PROVIDER_SITE_OTHER): Payer: Medicare Other | Admitting: General Surgery

## 2011-09-20 VITALS — BP 136/70 | HR 72 | Temp 98.0°F | Resp 16 | Ht 67.0 in | Wt 189.6 lb

## 2011-09-20 DIAGNOSIS — K439 Ventral hernia without obstruction or gangrene: Secondary | ICD-10-CM

## 2011-09-20 NOTE — Progress Notes (Signed)
Patient ID: Evan [name], male   DOB: [birth year], 71 y.o.   MRN: [id number]  Chief Complaint  Patient presents with  . New Evaluation    New PT. Eval Abd wall/Ventral Hernia    HPI Evan [name] is a 71 y.o. male.  Referred by Dr. Mayford Knife HPI This is a 71 year old male who is otherwise fairly healthy who presents with a several week history of what he notices is an abdominal wall bulge. He has had no change in his bowel movements and there's no symptoms really associated with this. He just notices a bulge when he is sitting up or having any increased abdominal pressure. This bulge is from his subxiphoid area down to his umbilicus.  Past Medical History  Diagnosis Date  . GERD (gastroesophageal reflux disease)   . Depression   . BPH (benign prostatic hyperplasia)   . Migraine   . COPD (chronic obstructive pulmonary disease)   . Arthritis     spine  . Gout, arthritis     feet  . Hypertension   . Shortness of breath     exertional  . Diverticular disease of colon   . Hard of hearing     does not wear hearing aides    Past Surgical History  Procedure Date  . Neck surgery 2009    Cervical Fusion x3  . Ulnar nerve repair 2010  . Tonsillectomy   . Cholecystectomy 10+ years ago  . Cardiovascular stress test 18 YEARS ago  . Cervical fusion     x 5  . Back surgery   . Lumbar fusion 2011  . Hernia repair   . Posterior cervical fusion/foraminotomy 06/09/2011    Procedure: POSTERIOR CERVICAL FUSION/FORAMINOTOMY LEVEL 4;  Surgeon: Mariam Dollar, MD;  Location: [facility];  Service: Neurosurgery;  Laterality: N/A;  Cervical three to seven  posterior cervical fusion, Cervical four to seven Laminectomy, bone graft from right iliac crest     History reviewed. No pertinent family history.  Social History History  Substance Use Topics  . Smoking status: Former Smoker -- 1.5 packs/day for 50 years    Types: Cigarettes    Quit date: 06/08/2009  . Smokeless  tobacco: Current User   Comment: Electronic Cigarette, 2 cartridges per day= 2 packs per day  . Alcohol Use: No    Allergies  Allergen Reactions  . Cephalosporins Other (See Comments)    Oral thrush    Current Outpatient Prescriptions  Medication Sig Dispense Refill  . hydrochlorothiazide (HYDRODIURIL) 25 MG tablet Take 25 mg by mouth daily.        Marland Kitchen omeprazole (PRILOSEC) 20 MG capsule Take 20 mg by mouth daily.        . sertraline (ZOLOFT) 25 MG tablet Take 25 mg by mouth daily.        . Tamsulosin HCl (FLOMAX) 0.4 MG CAPS Take 0.4 mg by mouth 2 (two) times daily.          Review of Systems Review of Systems  Constitutional: Negative for fever, chills and unexpected weight change.  HENT: Positive for hearing loss and trouble swallowing. Negative for congestion, sore throat and voice change.   Eyes: Negative for visual disturbance.  Respiratory: Negative for cough and wheezing.   Cardiovascular: Negative for chest pain, palpitations and leg swelling.  Gastrointestinal: Positive for constipation. Negative for nausea, vomiting, abdominal pain, diarrhea, blood in stool, abdominal distention, anal bleeding and rectal pain.  Genitourinary: Negative for hematuria and difficulty urinating.  Musculoskeletal: Positive for arthralgias.  Skin: Negative for rash and wound.  Neurological: Negative for seizures, syncope, weakness and headaches.  Hematological: Negative for adenopathy. Does not bruise/bleed easily.  Psychiatric/Behavioral: Negative for confusion.    Blood pressure 136/70, pulse 72, temperature 98 F (36.7 C), temperature source Temporal, resp. rate 16, height 5\' 7"  (1.702 m), weight 189 lb 9.6 oz (86.002 kg).  Physical Exam Physical Exam  Vitals reviewed. Constitutional: He appears well-developed and well-nourished.  Abdominal: Soft. There is no tenderness.        Assessment    Likely diastasis recti    Plan    I think this is a diastasis recti. It is hard  for me to tell definitively that he does not have a hernia at the site of the prior incision so. I think I'm going to obtain a CT scan and if this is negative he understands that there is no further therapy as needed.       [name] 09/20/2011, 3:52 PM

## 2011-09-24 ENCOUNTER — Ambulatory Visit
Admission: RE | Admit: 2011-09-24 | Discharge: 2011-09-24 | Disposition: A | Discharge status: Home / Self Care | Payer: Medicare Other | Source: Ambulatory Visit | Attending: General Surgery | Admitting: General Surgery

## 2011-09-24 DIAGNOSIS — K439 Ventral hernia without obstruction or gangrene: Secondary | ICD-10-CM

## 2011-09-24 MED ORDER — IOHEXOL 300 MG/ML  SOLN
100.0000 mL | Freq: Once | INTRAMUSCULAR | Status: AC | PRN
  Administered 2011-09-24: 100 mL via INTRAVENOUS

## 2011-09-29 ENCOUNTER — Telehealth (INDEPENDENT_AMBULATORY_CARE_PROVIDER_SITE_OTHER): Payer: Self-pay

## 2011-09-29 NOTE — Telephone Encounter (Signed)
Called Evan [name] to notify him that Dr Dwain Sarna reviewed his scan which shows no ventral hernia but just diastasis. The Evan [name] has a very small groin hernia but if it's not bothering the Evan [name] to just leave it a lone. The Evan [name] states the groin hernia has been there for 10 years or more that he would just leave it a lone. I did advise Evan [name] that there was an area to show up on the xray about his lung that needed f/u in 54yr by xray that we would turn this over to his PCP to follow. The Evan [name] understands. I will contact the office of Dr Lerry Liner today.

## 2011-10-04 ENCOUNTER — Ambulatory Visit (INDEPENDENT_AMBULATORY_CARE_PROVIDER_SITE_OTHER): Payer: Medicare Other | Admitting: Surgery

## 2011-10-13 ENCOUNTER — Other Ambulatory Visit: Payer: Self-pay | Admitting: Neurosurgery

## 2011-10-13 DIAGNOSIS — M545 Low back pain: Secondary | ICD-10-CM

## 2011-10-19 ENCOUNTER — Ambulatory Visit
Admission: RE | Admit: 2011-10-19 | Discharge: 2011-10-19 | Disposition: A | Discharge status: Home / Self Care | Payer: Medicare Other | Source: Ambulatory Visit | Attending: Neurosurgery | Admitting: Neurosurgery

## 2011-10-19 DIAGNOSIS — M545 Low back pain: Secondary | ICD-10-CM

## 2011-10-19 MED ORDER — GADOBENATE DIMEGLUMINE 529 MG/ML IV SOLN
17.0000 mL | Freq: Once | INTRAVENOUS | Status: AC | PRN
  Administered 2011-10-19: 17 mL via INTRAVENOUS

## 2011-10-27 ENCOUNTER — Other Ambulatory Visit: Payer: Self-pay | Admitting: Neurosurgery

## 2011-10-27 ENCOUNTER — Encounter (HOSPITAL_COMMUNITY): Payer: Self-pay | Admitting: Pharmacy Technician

## 2011-10-28 ENCOUNTER — Encounter (HOSPITAL_COMMUNITY)
Admission: RE | Admit: 2011-10-28 | Discharge: 2011-10-28 | Disposition: A | Discharge status: Home / Self Care | Payer: Medicare Other | Source: Ambulatory Visit | Attending: Neurosurgery | Admitting: Neurosurgery

## 2011-10-28 ENCOUNTER — Encounter (HOSPITAL_COMMUNITY)
Admission: RE | Admit: 2011-10-28 | Discharge: 2011-10-28 | Disposition: A | Discharge status: Home / Self Care | Payer: Medicare Other | Source: Ambulatory Visit | Attending: Anesthesiology | Admitting: Anesthesiology

## 2011-10-28 ENCOUNTER — Encounter (HOSPITAL_COMMUNITY): Payer: Self-pay

## 2011-10-28 LAB — BASIC METABOLIC PANEL WITH GFR
BUN: 20 mg/dL (ref 6–23)
CO2: 25 meq/L (ref 19–32)
Calcium: 9 mg/dL (ref 8.4–10.5)
Chloride: 105 meq/L (ref 96–112)
Creatinine, Ser: 0.77 mg/dL (ref 0.50–1.35)
GFR calc Af Amer: >90 mL/min
GFR calc non Af Amer: 90 mL/min — ABNORMAL LOW
Glucose, Bld: 143 mg/dL — ABNORMAL HIGH (ref 70–99)
Potassium: 3.8 meq/L (ref 3.5–5.1)
Sodium: 143 meq/L (ref 135–145)

## 2011-10-28 LAB — CBC
Hemoglobin: 13.2 g/dL (ref 13.0–17.0)
MCHC: 33.8 g/dL (ref 30.0–36.0)
RDW: 13.7 % (ref 11.5–15.5)

## 2011-10-28 LAB — SURGICAL PCR SCREEN
MRSA, PCR: NEGATIVE
Staphylococcus aureus: NEGATIVE

## 2011-10-28 NOTE — Pre-Procedure Instructions (Signed)
20 Evan [name]  10/28/2011   Your procedure is scheduled on:  Friday July 12  Report to [facility] at 5:30 AM.  Call this number if you have problems the morning of surgery: [phone]   Remember:   Do not eat food:After Midnight.  May have clear liquids:until Midnight .  Clear liquids include soda, tea, black coffee, apple or grape juice, broth.  Take these medicines the morning of surgery with A SIP OF WATER: Omeprazole   Do not wear jewelry, make-up or nail polish.  Do not wear lotions, powders, or perfumes. You may wear deodorant.  Do not shave 48 hours prior to surgery. Men may shave face and neck.  Do not bring valuables to the hospital.  Contacts, dentures or bridgework may not be worn into surgery.  Leave suitcase in the car. After surgery it may be brought to your room.  For patients admitted to the hospital, checkout time is 11:00 AM the day of discharge.   Patients discharged the day of surgery will not be allowed to drive home.  Name and phone number of your driver: NA  Special Instructions: CHG Shower Use Special Wash: 1/2 bottle night before surgery and 1/2 bottle morning of surgery.   Please read over the following fact sheets that you were given: Pain Booklet, Coughing and Deep Breathing and Surgical Site Infection Prevention

## 2011-10-28 NOTE — Progress Notes (Signed)
Surgical orders need to be signed by Dr. Wynetta Emery, notified Erie Noe.

## 2011-10-29 ENCOUNTER — Inpatient Hospital Stay (HOSPITAL_COMMUNITY): Payer: Medicare Other | Admitting: Anesthesiology

## 2011-10-29 ENCOUNTER — Other Ambulatory Visit: Payer: Medicare Other

## 2011-10-29 ENCOUNTER — Encounter (HOSPITAL_COMMUNITY): Payer: Self-pay | Admitting: *Deleted

## 2011-10-29 ENCOUNTER — Encounter (HOSPITAL_COMMUNITY): Payer: Self-pay | Admitting: Anesthesiology

## 2011-10-29 ENCOUNTER — Inpatient Hospital Stay (HOSPITAL_COMMUNITY): Payer: Medicare Other

## 2011-10-29 ENCOUNTER — Inpatient Hospital Stay (HOSPITAL_COMMUNITY)
Admission: RE | Admit: 2011-10-29 | Discharge: 2011-10-29 | DRG: 491 | Disposition: A | Discharge status: Home / Self Care | Payer: Medicare Other | Source: Ambulatory Visit | Attending: Neurosurgery | Admitting: Neurosurgery

## 2011-10-29 ENCOUNTER — Encounter (HOSPITAL_COMMUNITY)
Admission: RE | Disposition: A | Discharge status: Home / Self Care | Payer: Self-pay | Source: Ambulatory Visit | Attending: Neurosurgery

## 2011-10-29 DIAGNOSIS — N400 Enlarged prostate without lower urinary tract symptoms: Secondary | ICD-10-CM | POA: Diagnosis present

## 2011-10-29 DIAGNOSIS — F3289 Other specified depressive episodes: Secondary | ICD-10-CM | POA: Diagnosis present

## 2011-10-29 DIAGNOSIS — Z01812 Encounter for preprocedural laboratory examination: Secondary | ICD-10-CM

## 2011-10-29 DIAGNOSIS — K219 Gastro-esophageal reflux disease without esophagitis: Secondary | ICD-10-CM | POA: Diagnosis present

## 2011-10-29 DIAGNOSIS — F329 Major depressive disorder, single episode, unspecified: Secondary | ICD-10-CM | POA: Diagnosis present

## 2011-10-29 DIAGNOSIS — I10 Essential (primary) hypertension: Secondary | ICD-10-CM | POA: Diagnosis present

## 2011-10-29 DIAGNOSIS — J449 Chronic obstructive pulmonary disease, unspecified: Secondary | ICD-10-CM | POA: Diagnosis present

## 2011-10-29 DIAGNOSIS — J4489 Other specified chronic obstructive pulmonary disease: Secondary | ICD-10-CM | POA: Diagnosis present

## 2011-10-29 DIAGNOSIS — Z87891 Personal history of nicotine dependence: Secondary | ICD-10-CM

## 2011-10-29 DIAGNOSIS — M5126 Other intervertebral disc displacement, lumbar region: Principal | ICD-10-CM | POA: Diagnosis present

## 2011-10-29 HISTORY — PX: LUMBAR LAMINECTOMY/DECOMPRESSION MICRODISCECTOMY: SHX5026

## 2011-10-29 SURGERY — LUMBAR LAMINECTOMY/DECOMPRESSION MICRODISCECTOMY 1 LEVEL
Anesthesia: General | Site: Spine Lumbar | Laterality: Right | Wound class: Clean

## 2011-10-29 MED ORDER — SERTRALINE HCL 25 MG PO TABS: 25.0000 mg | ORAL_TABLET | Freq: Every day | ORAL | Status: DC

## 2011-10-29 MED ORDER — LACTATED RINGERS IV SOLN
INTRAVENOUS | Status: DC | PRN
  Administered 2011-10-29 (×3): via INTRAVENOUS

## 2011-10-29 MED ORDER — EPHEDRINE SULFATE 50 MG/ML IJ SOLN
INTRAMUSCULAR | Status: DC | PRN
  Administered 2011-10-29 (×5): 5 mg via INTRAVENOUS

## 2011-10-29 MED ORDER — HEMOSTATIC AGENTS (NO CHARGE) OPTIME
TOPICAL | Status: DC | PRN
  Administered 2011-10-29: 1 via TOPICAL

## 2011-10-29 MED ORDER — PROPOFOL 10 MG/ML IV EMUL
INTRAVENOUS | Status: DC | PRN
  Administered 2011-10-29: 170 mg via INTRAVENOUS
  Administered 2011-10-29: 30 mg via INTRAVENOUS

## 2011-10-29 MED ORDER — SODIUM CHLORIDE 0.9 % IR SOLN
Status: DC | PRN
  Administered 2011-10-29: 08:00:00

## 2011-10-29 MED ORDER — TAMSULOSIN HCL 0.4 MG PO CAPS
0.4000 mg | ORAL_CAPSULE | Freq: Two times a day (BID) | ORAL | Status: DC
  Filled 2011-10-29: qty 1

## 2011-10-29 MED ORDER — THROMBIN 5000 UNITS EX KIT
PACK | CUTANEOUS | Status: DC | PRN
  Administered 2011-10-29 (×2): 5000 [IU] via TOPICAL

## 2011-10-29 MED ORDER — PHENYLEPHRINE HCL 10 MG/ML IJ SOLN
INTRAMUSCULAR | Status: DC | PRN
  Administered 2011-10-29 (×2): 80 ug via INTRAVENOUS
  Administered 2011-10-29: 40 ug via INTRAVENOUS
  Administered 2011-10-29: 80 ug via INTRAVENOUS

## 2011-10-29 MED ORDER — FENTANYL CITRATE 0.05 MG/ML IJ SOLN
INTRAMUSCULAR | Status: DC | PRN
  Administered 2011-10-29: 100 ug via INTRAVENOUS
  Administered 2011-10-29 (×3): 50 ug via INTRAVENOUS

## 2011-10-29 MED ORDER — LIDOCAINE-EPINEPHRINE 1 %-1:100000 IJ SOLN
INTRAMUSCULAR | Status: DC | PRN
  Administered 2011-10-29: 10 mL

## 2011-10-29 MED ORDER — VECURONIUM BROMIDE 10 MG IV SOLR
INTRAVENOUS | Status: DC | PRN
  Administered 2011-10-29 (×2): 1 mg via INTRAVENOUS

## 2011-10-29 MED ORDER — OXYCODONE-ACETAMINOPHEN 5-325 MG PO TABS: 1.0000 | ORAL_TABLET | ORAL | Status: DC | PRN

## 2011-10-29 MED ORDER — BUPIVACAINE HCL (PF) 0.25 % IJ SOLN
INTRAMUSCULAR | Status: DC | PRN
  Administered 2011-10-29: 10 mL

## 2011-10-29 MED ORDER — 0.9 % SODIUM CHLORIDE (POUR BTL) OPTIME
TOPICAL | Status: DC | PRN
  Administered 2011-10-29: 1000 mL

## 2011-10-29 MED ORDER — GLYCOPYRROLATE 0.2 MG/ML IJ SOLN
INTRAMUSCULAR | Status: DC | PRN
  Administered 2011-10-29: 0.6 mg via INTRAVENOUS
  Administered 2011-10-29: 0.2 mg via INTRAVENOUS

## 2011-10-29 MED ORDER — ONDANSETRON HCL 4 MG/2ML IJ SOLN: 4.0000 mg | Freq: Four times a day (QID) | INTRAMUSCULAR | Status: DC | PRN

## 2011-10-29 MED ORDER — SODIUM CHLORIDE 0.9 % IJ SOLN
3.0000 mL | Freq: Two times a day (BID) | INTRAMUSCULAR | Status: DC
  Administered 2011-10-29: 3 mL via INTRAVENOUS

## 2011-10-29 MED ORDER — MENTHOL 3 MG MT LOZG: 1.0000 | LOZENGE | OROMUCOSAL | Status: DC | PRN

## 2011-10-29 MED ORDER — SODIUM CHLORIDE 0.9 % IV SOLN: 250.0000 mL | INTRAVENOUS | Status: DC

## 2011-10-29 MED ORDER — CYCLOBENZAPRINE HCL 10 MG PO TABS: 10.0000 mg | ORAL_TABLET | Freq: Three times a day (TID) | ORAL | Status: DC | PRN

## 2011-10-29 MED ORDER — CEFAZOLIN SODIUM 1-5 GM-% IV SOLN
1.0000 g | Freq: Three times a day (TID) | INTRAVENOUS | Status: DC
  Administered 2011-10-29: 1 g via INTRAVENOUS
  Filled 2011-10-29 (×2): qty 50

## 2011-10-29 MED ORDER — SODIUM CHLORIDE 0.9 % IJ SOLN: 3.0000 mL | INTRAMUSCULAR | Status: DC | PRN

## 2011-10-29 MED ORDER — ACETAMINOPHEN 325 MG PO TABS
650.0000 mg | ORAL_TABLET | ORAL | Status: DC | PRN
  Administered 2011-10-29: 650 mg via ORAL
  Filled 2011-10-29: qty 2

## 2011-10-29 MED ORDER — HYDROCHLOROTHIAZIDE 25 MG PO TABS: 25.0000 mg | ORAL_TABLET | Freq: Every day | ORAL | Status: DC

## 2011-10-29 MED ORDER — THROMBIN 5000 UNITS EX SOLR
OROMUCOSAL | Status: DC | PRN
  Administered 2011-10-29: 09:00:00 via TOPICAL

## 2011-10-29 MED ORDER — AMPICILLIN 500 MG PO CAPS: 500.0000 mg | ORAL_CAPSULE | Freq: Four times a day (QID) | ORAL | Status: DC

## 2011-10-29 MED ORDER — ONDANSETRON HCL 4 MG/2ML IJ SOLN: 4.0000 mg | INTRAMUSCULAR | Status: DC | PRN

## 2011-10-29 MED ORDER — CEFAZOLIN SODIUM-DEXTROSE 2-3 GM-% IV SOLR
INTRAVENOUS | Status: AC
  Administered 2011-10-29: 2 g via INTRAVENOUS
  Filled 2011-10-29: qty 50

## 2011-10-29 MED ORDER — ONDANSETRON HCL 4 MG/2ML IJ SOLN
INTRAMUSCULAR | Status: DC | PRN
  Administered 2011-10-29: 4 mg via INTRAVENOUS

## 2011-10-29 MED ORDER — PHENOL 1.4 % MT LIQD: 1.0000 | OROMUCOSAL | Status: DC | PRN

## 2011-10-29 MED ORDER — OXYCODONE-ACETAMINOPHEN 10-650 MG PO TABS: 0.5000 | ORAL_TABLET | ORAL | Status: DC | PRN

## 2011-10-29 MED ORDER — BACITRACIN 50000 UNITS IM SOLR
INTRAMUSCULAR | Status: AC
  Filled 2011-10-29: qty 1

## 2011-10-29 MED ORDER — ROCURONIUM BROMIDE 100 MG/10ML IV SOLN
INTRAVENOUS | Status: DC | PRN
  Administered 2011-10-29: 50 mg via INTRAVENOUS

## 2011-10-29 MED ORDER — HYDROMORPHONE HCL PF 1 MG/ML IJ SOLN: 0.2500 mg | INTRAMUSCULAR | Status: DC | PRN

## 2011-10-29 MED ORDER — SODIUM CHLORIDE 0.9 % IV SOLN
INTRAVENOUS | Status: AC
  Filled 2011-10-29: qty 500

## 2011-10-29 MED ORDER — ACETAMINOPHEN 650 MG RE SUPP: 650.0000 mg | RECTAL | Status: DC | PRN

## 2011-10-29 MED ORDER — OXYCODONE-ACETAMINOPHEN 5-325 MG PO TABS: 2.0000 | ORAL_TABLET | ORAL | Status: DC | PRN

## 2011-10-29 MED ORDER — PANTOPRAZOLE SODIUM 40 MG PO TBEC: 40.0000 mg | DELAYED_RELEASE_TABLET | Freq: Every day | ORAL | Status: DC

## 2011-10-29 MED ORDER — LIDOCAINE HCL (CARDIAC) 20 MG/ML IV SOLN
INTRAVENOUS | Status: DC | PRN
  Administered 2011-10-29: 80 mg via INTRAVENOUS

## 2011-10-29 MED ORDER — NEOSTIGMINE METHYLSULFATE 1 MG/ML IJ SOLN
INTRAMUSCULAR | Status: DC | PRN
  Administered 2011-10-29: 5 mg via INTRAVENOUS

## 2011-10-29 MED ORDER — HYDROMORPHONE HCL PF 1 MG/ML IJ SOLN: 0.5000 mg | INTRAMUSCULAR | Status: DC | PRN

## 2011-10-29 SURGICAL SUPPLY — 64 items
ADH SKN CLS APL DERMABOND .7 (GAUZE/BANDAGES/DRESSINGS)
ADH SKN CLS LQ APL DERMABOND (GAUZE/BANDAGES/DRESSINGS) ×3
APL SKNCLS STERI-STRIP NONHPOA (GAUZE/BANDAGES/DRESSINGS) ×1
BAG DECANTER FOR FLEXI CONT (MISCELLANEOUS) ×2 IMPLANT
BENZOIN TINCTURE PRP APPL 2/3 (GAUZE/BANDAGES/DRESSINGS) ×2 IMPLANT
BLADE SURG 11 STRL SS (BLADE) ×2 IMPLANT
BLADE SURG ROTATE 9660 (MISCELLANEOUS) ×1 IMPLANT
BRUSH SCRUB EZ PLAIN DRY (MISCELLANEOUS) ×2 IMPLANT
BUR MATCHSTICK NEURO 3.0 LAGG (BURR) ×2 IMPLANT
BUR PRECISION FLUTE 6.0 (BURR) ×2 IMPLANT
CANISTER SUCTION 2500CC (MISCELLANEOUS) ×2 IMPLANT
CLOTH BEACON ORANGE TIMEOUT ST (SAFETY) ×2 IMPLANT
CONT SPEC 4OZ CLIKSEAL STRL BL (MISCELLANEOUS) ×2 IMPLANT
DECANTER SPIKE VIAL GLASS SM (MISCELLANEOUS) ×2 IMPLANT
DERMABOND ADHESIVE PROPEN (GAUZE/BANDAGES/DRESSINGS) ×3
DERMABOND ADVANCED (GAUZE/BANDAGES/DRESSINGS)
DERMABOND ADVANCED .7 DNX12 (GAUZE/BANDAGES/DRESSINGS) ×1 IMPLANT
DERMABOND ADVANCED .7 DNX6 (GAUZE/BANDAGES/DRESSINGS) IMPLANT
DRAPE LAPAROTOMY 100X72X124 (DRAPES) ×2 IMPLANT
DRAPE MICROSCOPE LEICA (MISCELLANEOUS) ×1 IMPLANT
DRAPE MICROSCOPE ZEISS OPMI (DRAPES) IMPLANT
DRAPE POUCH INSTRU U-SHP 10X18 (DRAPES) ×2 IMPLANT
DRAPE PROXIMA HALF (DRAPES) IMPLANT
DRAPE SURG 17X23 STRL (DRAPES) ×2 IMPLANT
DRSG OPSITE 4X5.5 SM (GAUZE/BANDAGES/DRESSINGS) ×2 IMPLANT
ELECT REM PT RETURN 9FT ADLT (ELECTROSURGICAL) ×2
ELECTRODE REM PT RTRN 9FT ADLT (ELECTROSURGICAL) ×1 IMPLANT
EVACUATOR 1/8 PVC DRAIN (DRAIN) ×1 IMPLANT
GAUZE SPONGE 4X4 16PLY XRAY LF (GAUZE/BANDAGES/DRESSINGS) IMPLANT
GLOVE BIO SURGEON STRL SZ8 (GLOVE) ×2 IMPLANT
GLOVE BIOGEL PI IND STRL 7.0 (GLOVE) IMPLANT
GLOVE BIOGEL PI IND STRL 8.5 (GLOVE) ×1 IMPLANT
GLOVE BIOGEL PI INDICATOR 7.0 (GLOVE) ×1
GLOVE BIOGEL PI INDICATOR 8.5 (GLOVE) ×1
GLOVE ECLIPSE 8.5 STRL (GLOVE) ×2 IMPLANT
GLOVE EXAM NITRILE LRG STRL (GLOVE) IMPLANT
GLOVE EXAM NITRILE MD LF STRL (GLOVE) IMPLANT
GLOVE EXAM NITRILE XL STR (GLOVE) IMPLANT
GLOVE EXAM NITRILE XS STR PU (GLOVE) IMPLANT
GLOVE INDICATOR 8.5 STRL (GLOVE) ×2 IMPLANT
GLOVE SURG SS PI 7.0 STRL IVOR (GLOVE) ×3 IMPLANT
GOWN BRE IMP SLV AUR LG STRL (GOWN DISPOSABLE) IMPLANT
GOWN BRE IMP SLV AUR XL STRL (GOWN DISPOSABLE) ×3 IMPLANT
GOWN STRL REIN 2XL LVL4 (GOWN DISPOSABLE) ×2 IMPLANT
HEMOSTAT POWDER SURGIFOAM 1G (HEMOSTASIS) ×1 IMPLANT
KIT BASIN OR (CUSTOM PROCEDURE TRAY) ×2 IMPLANT
KIT ROOM TURNOVER OR (KITS) ×2 IMPLANT
NDL SPNL 22GX3.5 QUINCKE BK (NEEDLE) ×1 IMPLANT
NEEDLE HYPO 22GX1.5 SAFETY (NEEDLE) ×2 IMPLANT
NEEDLE SPNL 22GX3.5 QUINCKE BK (NEEDLE) ×2 IMPLANT
NS IRRIG 1000ML POUR BTL (IV SOLUTION) ×2 IMPLANT
PACK LAMINECTOMY NEURO (CUSTOM PROCEDURE TRAY) ×2 IMPLANT
RUBBERBAND STERILE (MISCELLANEOUS) ×4 IMPLANT
SPONGE GAUZE 4X4 12PLY (GAUZE/BANDAGES/DRESSINGS) ×2 IMPLANT
SPONGE SURGIFOAM ABS GEL SZ50 (HEMOSTASIS) ×2 IMPLANT
STRIP CLOSURE SKIN 1/2X4 (GAUZE/BANDAGES/DRESSINGS) ×2 IMPLANT
SUT VIC AB 0 CT1 18XCR BRD8 (SUTURE) ×1 IMPLANT
SUT VIC AB 0 CT1 8-18 (SUTURE) ×2
SUT VIC AB 2-0 CT1 18 (SUTURE) ×2 IMPLANT
SUT VICRYL 4-0 PS2 18IN ABS (SUTURE) ×2 IMPLANT
SYR 20ML ECCENTRIC (SYRINGE) ×2 IMPLANT
TOWEL OR 17X24 6PK STRL BLUE (TOWEL DISPOSABLE) ×2 IMPLANT
TOWEL OR 17X26 10 PK STRL BLUE (TOWEL DISPOSABLE) ×2 IMPLANT
WATER STERILE IRR 1000ML POUR (IV SOLUTION) ×2 IMPLANT

## 2011-10-29 NOTE — Anesthesia Postprocedure Evaluation (Signed)
Anesthesia Post Note  Patient: Evan [name]  Procedure(s) Performed: Procedure(s) (LRB): LUMBAR LAMINECTOMY/DECOMPRESSION MICRODISCECTOMY 1 LEVEL (Right)  Anesthesia type: General  Patient location: PACU  Post pain: Pain level controlled and Adequate analgesia  Post assessment: Post-op Vital signs reviewed, Patient's Cardiovascular Status Stable, Respiratory Function Stable, Patent Airway and Pain level controlled  Last Vitals:  Filed Vitals:   10/29/11 1000  BP:   Pulse: 51  Temp: 36.2 C  Resp: 13    Post vital signs: Reviewed and stable  Level of consciousness: awake, alert  and oriented  Complications: No apparent anesthesia complications

## 2011-10-29 NOTE — Progress Notes (Signed)
UR COMPLETED  

## 2011-10-29 NOTE — Discharge Summary (Signed)
  Physician Discharge Summary  Patient ID: Evan [name] MRN: [id number] DOB/AGE: 1940-07-20 71 y.o.  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 *    Discharged Condition: good  Hospital Course: Patient was admitted hospital underwent an L3-4 laminectomy microdiscectomy from the right postop patient did very well recovered in the floor on the floor was convalescing well and living and voiding spontaneously tolerating regular diet and was able be discharged home on postop day 0 scheduled followup approximately 1-2 weeks.  Consults: Significant Diagnostic Studies: Treatments: L3-4 lumbar laminectomy on the right Discharge Exam: Blood pressure 108/64, pulse 72, temperature 97.6 F (36.4 C), temperature source Oral, resp. rate 18, SpO2 93.00%. Strength out of 5 wound clean and dry  Disposition: Home   Medication List  As of 10/29/2011  1:53 PM   TAKE these medications         ampicillin 500 MG capsule   Commonly known as: PRINCIPEN   Take 500 mg by mouth 4 (four) times daily.      hydrochlorothiazide 25 MG tablet   Commonly known as: HYDRODIURIL   Take 25 mg by mouth daily.      omeprazole 20 MG capsule   Commonly known as: PRILOSEC   Take 20 mg by mouth daily.      oxyCODONE-acetaminophen 10-650 MG per tablet   Commonly known as: PERCOCET   Take 0.5-1 tablets by mouth every 6 (six) hours as needed. For pain      sertraline 25 MG tablet   Commonly known as: ZOLOFT   Take 25 mg by mouth daily.      Tamsulosin HCl 0.4 MG Caps   Commonly known as: FLOMAX   Take 0.4 mg by mouth 2 (two) times daily.      URIBEL 118 MG Caps   Take 1 capsule by mouth 2 (two) times daily.             Signed: Javad Salva P 10/29/2011, 1:53 PM

## 2011-10-29 NOTE — Plan of Care (Signed)
Problem: Consults Goal: Diagnosis - Spinal Surgery Outcome: Completed/Met Date Met:  10/29/11 Microdiscectomy     

## 2011-10-29 NOTE — Anesthesia Preprocedure Evaluation (Addendum)
Anesthesia Evaluation  Patient identified by MRN, date of birth, ID band Patient awake    Reviewed: Allergy & Precautions, H&P , NPO status , Patient's Chart, lab work & pertinent test results  History of Anesthesia Complications Negative for: history of anesthetic complications  Airway Mallampati: II TM Distance: >3 FB Neck ROM: Limited    Dental  (+) Edentulous Upper, Edentulous Lower and Dental Advisory Given   Pulmonary shortness of breath, COPDformer smoker,  75 pack year smoking history; quit 1 year ago breath sounds clear to auscultation        Cardiovascular hypertension, Rhythm:Regular Rate:Normal     Neuro/Psych  Headaches, Depression    GI/Hepatic GERD-  Medicated and Controlled,  Endo/Other    Renal/GU      Musculoskeletal  (+) Arthritis -,   Abdominal   Peds  Hematology   Anesthesia Other Findings   Reproductive/Obstetrics                          Anesthesia Physical Anesthesia Plan  ASA: III  Anesthesia Plan: General   Post-op Pain Management:    Induction: Intravenous  Airway Management Planned: Oral ETT  Additional Equipment:   Intra-op Plan:   Post-operative Plan: Extubation in OR  Informed Consent: I have reviewed the patients History and Physical, chart, labs and discussed the procedure including the risks, benefits and alternatives for the proposed anesthesia with the patient or authorized representative who has indicated his/her understanding and acceptance.     Plan Discussed with: CRNA and Surgeon  Anesthesia Plan Comments:         Anesthesia Quick Evaluation

## 2011-10-29 NOTE — Op Note (Signed)
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  EBL: Minimal  History of present illness: Patient is a very pleasant 63 or gentleman has a several weeks a progress worsening back and right leg pain rating down the outside lateral aspect of his thigh the front of his shin consistent with an L4 nerve root pattern workup revealed large disc herniation with inferiorly migrating fragment displaced the right L4 nerve root his right L4 pedicle patient failed conservative treatment and progression clinical syndrome and imaging findings I recommended laminectomy microscope at this level  and explained the risks and benefits with him he understood and agreed to proceed forward. I explained the risks and benefits, alternatives surgery, expectations of outcome perioperative course and he understood and agreed to proceed forward.  Operative procedure: Patient was brought into the or was induced under general anesthesia positioned prone the Wilson frame his back was prepped and agrees to fashion his old incision was opened up and subperiosteal dissections care on the right at L3-4 interoperative X. identify the appropriate level so the interest of the 3 medial facet complexes suppressant of L4 I was going to high-speed drill laminotomy was was begun with a 3 mm Kerrison punch the facet was a markedly hypertrophied this was all under been the ligament was identified also noted the markedly hypertrophied this was removed in piecemeal fashion exposing the thecal sac. At this point the operating microscope was draped and brought in the field limits illumination the hypertrophied ligament and medial facet complexes further dissected off of the dura  and removed in piecemeal fashion March and down the level of the L4 pedicle and laminotomy was carried distally to just inferior to the L4 pedicle. The L4 nerve root was identified and the margins of the pedicle of the disc space the disc spaces identified epidural veins were coagulated and the L4 nerve root was reflected medially. Annulotomy was made with 11 blade scalpel and using nerve hook and coronary dilator several large free fragments of disc were removed from the medial aspect of the L4 pedicle from underneath the L4 nerve root so after several fragments removed and the L4 nerve root was completely decompressed. I then marched superiorly to the level of the space extended the annulotomy and the space and clean the disc space several additional large fibers removed the central compartment and this was cleaned out the discectomy there is no further stenosis on thecal sac centrally or inferiorly the L4 foramen again this is Rich oral and hockey-stick coronary.her. The wound scope to irrigate fixing space was maintained the Gelfoam was laid up the dura a Hemovac drain was placed and the wounds closed in layers with after Vicryl the skin was) 4 subcuticular benzoin shift applied patient recovered in stable condition at the end of case on it counts sponge counts were correct.

## 2011-10-29 NOTE — Anesthesia Procedure Notes (Addendum)
Procedure Name: Intubation Date/Time: 10/29/2011 7:33 AM Performed by: Lovie Chol Pre-anesthesia Checklist: Patient identified, Emergency Drugs available, Suction available, Patient being monitored and Timeout performed Oxygen Delivery Method: Circle system utilized Preoxygenation: Pre-oxygenation with 100% oxygen Intubation Type: IV induction Ventilation: Mask ventilation without difficulty and Oral airway inserted - appropriate to patient size Laryngoscope Size: Miller and 2 Grade View: Grade I Tube type: Oral Tube size: 7.5 mm Number of attempts: 1 Airway Equipment and Method: Stylet Placement Confirmation: ETT inserted through vocal cords under direct vision,  positive ETCO2 and breath sounds checked- equal and bilateral Secured at: 21 cm Tube secured with: Tape Dental Injury: Teeth and Oropharynx as per pre-operative assessment

## 2011-10-29 NOTE — Preoperative (Signed)
Beta Blockers   Reason not to administer Beta Blockers:Not Applicable 

## 2011-10-29 NOTE — Transfer of Care (Signed)
Immediate Anesthesia Transfer of Care Note  Patient: Evan [name]  Procedure(s) Performed: Procedure(s) (LRB): LUMBAR LAMINECTOMY/DECOMPRESSION MICRODISCECTOMY 1 LEVEL (Right)  Patient Location: PACU  Anesthesia Type: General  Level of Consciousness: awake, alert  and patient cooperative  Airway & Oxygen Therapy: Patient Spontanous Breathing and Patient connected to nasal cannula oxygen  Post-op Assessment: Report given to PACU RN and Post -op Vital signs reviewed and stable  Post vital signs: Reviewed  Complications: No apparent anesthesia complications

## 2011-10-29 NOTE — H&P (Signed)
Evan [name] is an 72 y.o. male.   Chief Complaint: Back and right leg pain HPI: Patient reports 71 year old gentleman well-known to me has had previous anterior and posterior cervical fusions as well as left sided L3-4 laminectomy and discectomy. Several weeks ago patient presented with acute onset severe right looks to be pain rating down the anterior lateral aspect of his right thigh below his knee the front of his shin consistent with an L4 nerve root pattern. With MRI scan revealed a large disc herniation with free fragment migrating caudally displacing the right L4 nerve root this clearly is not well he was clinical syndrome he was refractory to all forms of conservative treatment imaging findings and progression of clinical syndrome I he was recommended a laminectomy discectomy from the right L3-4. I extensively the risks benefits of the operation as well as perioperative course and expectations about alternatives of surgery he understood and agreed to proceed forward. Patient denies any left leg symptoms denies any bowel bladder complaints.  Past Medical History  Diagnosis Date  . GERD (gastroesophageal reflux disease)   . Depression   . BPH (benign prostatic hyperplasia)   . Migraine   . COPD (chronic obstructive pulmonary disease)   . Arthritis     spine  . Gout, arthritis     feet  . Hypertension   . Shortness of breath     exertional  . Diverticular disease of colon   . Hard of hearing     does not wear hearing aides  . UTI (lower urinary tract infection)     finishing treatment for UTI  . Cough 10/28/11    productive cough    Past Surgical History  Procedure Date  . Neck surgery 2009    Cervical Fusion x3  . Ulnar nerve repair 2010  . Tonsillectomy   . Cholecystectomy 10+ years ago  . Cardiovascular stress test 18 YEARS ago  . Cervical fusion     x 5  . Back surgery   . Lumbar fusion 2011  . Hernia repair   . Posterior cervical fusion/foraminotomy  06/09/2011    Procedure: POSTERIOR CERVICAL FUSION/FORAMINOTOMY LEVEL 4;  Surgeon: Mariam Dollar, MD;  Location: [facility];  Service: Neurosurgery;  Laterality: N/A;  Cervical three to seven  posterior cervical fusion, Cervical four to seven Laminectomy, bone graft from right iliac crest     History reviewed. No pertinent family history. Social History:  reports that he quit smoking about 2 years ago. His smoking use included Cigarettes. He has a 75 pack-year smoking history. He uses smokeless tobacco. He reports that he does not drink alcohol or use illicit drugs.  Allergies:  Allergies  Allergen Reactions  . Cephalosporins Other (See Comments)    Oral thrush  . Oxycodone Itching    Medications Prior to Admission  Medication Sig Dispense Refill  . ampicillin (PRINCIPEN) 500 MG capsule Take 500 mg by mouth 4 (four) times daily.      . hydrochlorothiazide (HYDRODIURIL) 25 MG tablet Take 25 mg by mouth daily.        . Meth-Hyo-M Bl-Na Phos-Ph Sal (URIBEL) 118 MG CAPS Take 1 capsule by mouth 2 (two) times daily.      Marland Kitchen omeprazole (PRILOSEC) 20 MG capsule Take 20 mg by mouth daily.        Marland Kitchen oxyCODONE-acetaminophen (PERCOCET) 10-650 MG per tablet Take 0.5-1 tablets by mouth every 6 (six) hours as needed. For pain      .  sertraline (ZOLOFT) 25 MG tablet Take 25 mg by mouth daily.        . Tamsulosin HCl (FLOMAX) 0.4 MG CAPS Take 0.4 mg by mouth 2 (two) times daily.          Results for orders placed during the hospital encounter of 10/28/11 (from the past 48 hour(s))  BASIC METABOLIC PANEL     Status: Abnormal   Collection Time   10/28/11  2:00 PM      Component Value Range Comment   Sodium 143  135 - 145 mEq/L    Potassium 3.8  3.5 - 5.1 mEq/L SLIGHT HEMOLYSIS   Chloride 105  96 - 112 mEq/L    CO2 25  19 - 32 mEq/L    Glucose, Bld 143 (*) 70 - 99 mg/dL    BUN 20  6 - 23 mg/dL    Creatinine, Ser 1.61  0.50 - 1.35 mg/dL    Calcium 9.0  8.4 - 09.6 mg/dL    GFR calc non Af Amer 90 (*)  >90 mL/min    GFR calc Af Amer >90  >90 mL/min   CBC     Status: Normal   Collection Time   10/28/11  2:00 PM      Component Value Range Comment   WBC 7.7  4.0 - 10.5 K/uL    RBC 4.36  4.22 - 5.81 MIL/uL    Hemoglobin 13.2  13.0 - 17.0 g/dL    HCT 04.5  40.9 - 81.1 %    MCV 89.4  78.0 - 100.0 fL    MCH 30.3  26.0 - 34.0 pg    MCHC 33.8  30.0 - 36.0 g/dL    RDW 91.4  78.2 - 95.6 %    Platelets 183  150 - 400 K/uL   SURGICAL PCR SCREEN     Status: Normal   Collection Time   10/28/11  2:21 PM      Component Value Range Comment   MRSA, PCR NEGATIVE  NEGATIVE    Staphylococcus aureus NEGATIVE  NEGATIVE    Dg Chest 2 View  10/28/2011  *RADIOLOGY REPORT*  Clinical Data: Preop.  Cough.  CHEST - 2 VIEW  Comparison: 04/21/2011.  Findings: Trachea is midline.  Heart size normal.  Thoracic aorta is calcified.  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  Lungs are otherwise clear.  No pleural fluid.  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  Original Report Authenticated By: Reyes Ivan, M.D.    Review of Systems  Constitutional: Negative.   HENT: Positive for neck pain.   Eyes: Negative.   Respiratory: Negative.   Cardiovascular: Negative.   Gastrointestinal: Negative.   Genitourinary: Positive for urgency and frequency.  Musculoskeletal: Positive for myalgias, back pain, joint pain and falls.  Skin: Negative.   Neurological: Positive for tingling and sensory change.  Psychiatric/Behavioral: Negative.     Blood pressure 105/62, pulse 62, temperature 97.6 F (36.4 C), temperature source Oral, resp. rate 0, SpO2 95.00%. Physical Exam  Constitutional: He is oriented to person, place, and time. He appears well-developed and well-nourished.  HENT:  Head: Normocephalic and atraumatic.  Eyes: Pupils are equal, round, and reactive to light.  Respiratory: Breath sounds normal.  GI: Soft.  Neurological: He is alert and oriented to person, place,  and time. GCS eye subscore is 4. GCS verbal subscore is 5. GCS motor subscore is 6.  Reflex Scores:      Tricep reflexes are 0  on the right side and 0 on the left side.      Bicep reflexes are 0 on the right side and 0 on the left side.      Brachioradialis reflexes are 0 on the right side and 0 on the left side.      Patellar reflexes are 0 on the right side and 0 on the left side.      Achilles reflexes are 0 on the right side and 0 on the left side.      Patient has 5 out of 5 strength in his iliopsoas quadriceps gastrocs hamstrings anterior tibialis and EHL bilaterally and decreased sensation in L4 distribution the right leg.     Assessment/Plan 71 year old gentleman presents for right-sided L3-4 laminectomy microdiscectomy.  Toma Arts P 10/29/2011, 7:19 AM

## 2011-11-02 ENCOUNTER — Encounter (HOSPITAL_COMMUNITY): Payer: Self-pay | Admitting: Neurosurgery

## 2011-12-15 ENCOUNTER — Other Ambulatory Visit: Payer: Self-pay | Admitting: Neurosurgery

## 2011-12-15 DIAGNOSIS — M5106 Intervertebral disc disorders with myelopathy, lumbar region: Secondary | ICD-10-CM

## 2011-12-15 DIAGNOSIS — M48061 Spinal stenosis, lumbar region without neurogenic claudication: Secondary | ICD-10-CM

## 2011-12-15 DIAGNOSIS — M47817 Spondylosis without myelopathy or radiculopathy, lumbosacral region: Secondary | ICD-10-CM

## 2011-12-22 ENCOUNTER — Ambulatory Visit
Admission: RE | Admit: 2011-12-22 | Discharge: 2011-12-22 | Disposition: A | Discharge status: Home / Self Care | Payer: Medicare Other | Source: Ambulatory Visit | Attending: Neurosurgery | Admitting: Neurosurgery

## 2011-12-22 DIAGNOSIS — M5106 Intervertebral disc disorders with myelopathy, lumbar region: Secondary | ICD-10-CM

## 2011-12-22 DIAGNOSIS — M48061 Spinal stenosis, lumbar region without neurogenic claudication: Secondary | ICD-10-CM

## 2011-12-22 DIAGNOSIS — M47817 Spondylosis without myelopathy or radiculopathy, lumbosacral region: Secondary | ICD-10-CM

## 2011-12-22 MED ORDER — GADOBENATE DIMEGLUMINE 529 MG/ML IV SOLN
18.0000 mL | Freq: Once | INTRAVENOUS | Status: AC | PRN
  Administered 2011-12-22: 18 mL via INTRAVENOUS

## 2012-02-03 ENCOUNTER — Ambulatory Visit
Admission: RE | Admit: 2012-02-03 | Discharge: 2012-02-03 | Disposition: A | Discharge status: Home / Self Care | Payer: Medicare Other | Source: Ambulatory Visit | Attending: Neurosurgery | Admitting: Neurosurgery

## 2012-02-03 ENCOUNTER — Other Ambulatory Visit: Payer: Self-pay | Admitting: Neurosurgery

## 2012-02-03 DIAGNOSIS — M25551 Pain in right hip: Secondary | ICD-10-CM

## 2012-02-03 DIAGNOSIS — M25561 Pain in right knee: Secondary | ICD-10-CM

## 2012-06-12 ENCOUNTER — Emergency Department (HOSPITAL_COMMUNITY)
Admission: EM | Admit: 2012-06-12 | Discharge: 2012-06-12 | Disposition: A | Discharge status: Home / Self Care | Payer: Medicare Other | Attending: Emergency Medicine | Admitting: Emergency Medicine

## 2012-06-12 ENCOUNTER — Emergency Department (HOSPITAL_COMMUNITY): Payer: Medicare Other

## 2012-06-12 ENCOUNTER — Encounter (HOSPITAL_COMMUNITY): Payer: Self-pay | Admitting: *Deleted

## 2012-06-12 DIAGNOSIS — R42 Dizziness and giddiness: Secondary | ICD-10-CM | POA: Insufficient documentation

## 2012-06-12 DIAGNOSIS — J4489 Other specified chronic obstructive pulmonary disease: Secondary | ICD-10-CM | POA: Insufficient documentation

## 2012-06-12 DIAGNOSIS — Z8719 Personal history of other diseases of the digestive system: Secondary | ICD-10-CM | POA: Insufficient documentation

## 2012-06-12 DIAGNOSIS — J449 Chronic obstructive pulmonary disease, unspecified: Secondary | ICD-10-CM | POA: Insufficient documentation

## 2012-06-12 DIAGNOSIS — F329 Major depressive disorder, single episode, unspecified: Secondary | ICD-10-CM | POA: Insufficient documentation

## 2012-06-12 DIAGNOSIS — I10 Essential (primary) hypertension: Secondary | ICD-10-CM | POA: Insufficient documentation

## 2012-06-12 DIAGNOSIS — M436 Torticollis: Secondary | ICD-10-CM | POA: Insufficient documentation

## 2012-06-12 DIAGNOSIS — Z87891 Personal history of nicotine dependence: Secondary | ICD-10-CM | POA: Insufficient documentation

## 2012-06-12 DIAGNOSIS — F3289 Other specified depressive episodes: Secondary | ICD-10-CM | POA: Insufficient documentation

## 2012-06-12 DIAGNOSIS — M549 Dorsalgia, unspecified: Secondary | ICD-10-CM | POA: Insufficient documentation

## 2012-06-12 DIAGNOSIS — Z79899 Other long term (current) drug therapy: Secondary | ICD-10-CM | POA: Insufficient documentation

## 2012-06-12 DIAGNOSIS — Z9889 Other specified postprocedural states: Secondary | ICD-10-CM | POA: Insufficient documentation

## 2012-06-12 DIAGNOSIS — G43909 Migraine, unspecified, not intractable, without status migrainosus: Secondary | ICD-10-CM | POA: Insufficient documentation

## 2012-06-12 DIAGNOSIS — M5030 Other cervical disc degeneration, unspecified cervical region: Secondary | ICD-10-CM | POA: Insufficient documentation

## 2012-06-12 DIAGNOSIS — K219 Gastro-esophageal reflux disease without esophagitis: Secondary | ICD-10-CM | POA: Insufficient documentation

## 2012-06-12 DIAGNOSIS — H9190 Unspecified hearing loss, unspecified ear: Secondary | ICD-10-CM | POA: Insufficient documentation

## 2012-06-12 DIAGNOSIS — M129 Arthropathy, unspecified: Secondary | ICD-10-CM | POA: Insufficient documentation

## 2012-06-12 DIAGNOSIS — Z8709 Personal history of other diseases of the respiratory system: Secondary | ICD-10-CM | POA: Insufficient documentation

## 2012-06-12 DIAGNOSIS — Z87440 Personal history of urinary (tract) infections: Secondary | ICD-10-CM | POA: Insufficient documentation

## 2012-06-12 DIAGNOSIS — N400 Enlarged prostate without lower urinary tract symptoms: Secondary | ICD-10-CM | POA: Insufficient documentation

## 2012-06-12 MED ORDER — DIAZEPAM 5 MG PO TABS
5.0000 mg | ORAL_TABLET | Freq: Once | ORAL | Status: AC
  Administered 2012-06-12: 5 mg via ORAL
  Filled 2012-06-12: qty 1

## 2012-06-12 MED ORDER — DIAZEPAM 5 MG PO TABS: 5.0000 mg | ORAL_TABLET | Freq: Two times a day (BID) | ORAL | Status: DC | PRN

## 2012-06-12 NOTE — ED Notes (Signed)
Returned from CT.

## 2012-06-12 NOTE — ED Notes (Signed)
Pt has had several neck surgeries and last one was year ago by Dr. Wynetta Emery.  Pt states neck pain/spasm since the weekend.  No weakness or numbness to extremities, incontinence of bowel/bladder, no fever.  Ambulatory

## 2012-06-12 NOTE — ED Provider Notes (Signed)
History    Evan [name] is a 72 y.o. male who presents with cc of constant neck spasm/pain since Friday.  He states that the pain feels like a knife stabbing him in the neck, pointing to the C4-5 area. The patient states that he has had several neck and low back surgeries in the past and he feels as though "something has come loose" in his cervical area.  He states that the spasms are causing his neck pain.  He denies nubmness/tingling of upper extremities, no change in the neurological deficits in his lower extremities.  He denies trauma, fever, headache, blurred vision, speech disturbances, loss of bowel/bladder.  He has taken flexeril for his spasms and states that it does help.   CSN: [id number]  Arrival date & time 06/12/12  1002   First MD Initiated Contact with Patient 06/12/12 1017      Chief Complaint  Patient presents with  . Neck Pain    (Consider location/radiation/quality/duration/timing/severity/associated sxs/prior treatment) HPI  Past Medical History  Diagnosis Date  . GERD (gastroesophageal reflux disease)   . Depression   . BPH (benign prostatic hyperplasia)   . Migraine   . COPD (chronic obstructive pulmonary disease)   . Arthritis     spine  . Gout, arthritis     feet  . Hypertension   . Shortness of breath     exertional  . Diverticular disease of colon   . Hard of hearing     does not wear hearing aides  . UTI (lower urinary tract infection)     finishing treatment for UTI  . Cough 10/28/11    productive cough    Past Surgical History  Procedure Laterality Date  . Neck surgery  2009    Cervical Fusion x3  . Ulnar nerve repair  2010  . Tonsillectomy    . Cholecystectomy  10+ years ago  . Cardiovascular stress test  18 YEARS ago  . Cervical fusion      x 5  . Back surgery    . Lumbar fusion  2011  . Hernia repair    . Posterior cervical fusion/foraminotomy  06/09/2011    Procedure: POSTERIOR CERVICAL FUSION/FORAMINOTOMY LEVEL 4;   Surgeon: Mariam Dollar, MD;  Location: [facility];  Service: Neurosurgery;  Laterality: N/A;  Cervical three to seven  posterior cervical fusion, Cervical four to seven Laminectomy, bone graft from right iliac crest   . Lumbar laminectomy/decompression microdiscectomy  10/29/2011    Procedure: LUMBAR LAMINECTOMY/DECOMPRESSION MICRODISCECTOMY 1 LEVEL;  Surgeon: Mariam Dollar, MD;  Location: [facility];  Service: Neurosurgery;  Laterality: Right;  Right Lumbar Three-Four Lumbar Laminectomy/Microdiscectomy    No family history on file.  History  Substance Use Topics  . Smoking status: Former Smoker -- 1.50 packs/day for 50 years    Types: Cigarettes    Quit date: 06/08/2009  . Smokeless tobacco: Current User     Comment: Electronic Cigarette, 2 cartridges per day= 2 packs per day  . Alcohol Use: No      Review of Systems  Constitutional: Negative for fever and chills.  HENT: Positive for neck pain and neck stiffness. Negative for ear pain and congestion.   Eyes: Negative for pain and visual disturbance.  Respiratory: Negative for chest tightness.   Cardiovascular: Negative for chest pain.  Gastrointestinal: Negative for nausea, vomiting, abdominal pain, diarrhea and constipation.  Genitourinary: Negative for dysuria.  Musculoskeletal: Positive for back pain. Negative for gait problem.  Neurological:  Positive for dizziness. Negative for facial asymmetry, weakness, light-headedness, numbness and headaches.    Allergies  Cephalosporins and Oxycodone  Home Medications   Current Outpatient Rx  Name  Route  Sig  Dispense  Refill  . Aspirin-Salicylamide-Caffeine (BC HEADACHE POWDER PO)   Oral   Take 1 packet by mouth 3 (three) times daily as needed. For pain         . cyclobenzaprine (FLEXERIL) 10 MG tablet   Oral   Take 10 mg by mouth 3 (three) times daily as needed for muscle spasms.         . hydrochlorothiazide (HYDRODIURIL) 25 MG tablet   Oral   Take 25 mg by mouth  daily.           . Multiple Vitamin (MULTIVITAMIN WITH MINERALS) TABS   Oral   Take 1 tablet by mouth daily.         Marland Kitchen omeprazole (PRILOSEC) 20 MG capsule   Oral   Take 20 mg by mouth daily.           . sertraline (ZOLOFT) 25 MG tablet   Oral   Take 25 mg by mouth daily.           . Tamsulosin HCl (FLOMAX) 0.4 MG CAPS   Oral   Take 0.8 mg by mouth daily.          . diazepam (VALIUM) 5 MG tablet   Oral   Take 1 tablet (5 mg total) by mouth every 12 (twelve) hours as needed (for spasm.no driving, may cause drowsiness).   10 tablet   0     BP 139/69  Pulse 80  Temp(Src) 97.9 F (36.6 C) (Oral)  Resp 19  SpO2 96%  Physical Exam  Nursing note and vitals reviewed. Constitutional: He is oriented to person, place, and time. He appears well-developed and well-nourished.  HENT:  Head: Normocephalic and atraumatic.  Eyes: Pupils are equal, round, and reactive to light.  Neck:  paraspinous muscle spasm/tightness.  Decreased ROM with flexion, extension, lateral movement.  Cardiovascular: Normal rate and regular rhythm.   Pulmonary/Chest: Effort normal and breath sounds normal.  Abdominal: Soft. Bowel sounds are normal.  Neurological: He is alert and oriented to person, place, and time.  No upper extremity numbness/tingling/muscle weakness.  Skin: Skin is warm and dry.  Psychiatric: He has a normal mood and affect.    ED Course  Procedures (including critical care time)  Labs Reviewed - No data to display Ct Cervical Spine Wo Contrast  06/12/2012  *RADIOLOGY REPORT*  Clinical Data: Neck pain.  CT CERVICAL SPINE WITHOUT CONTRAST  Technique:  Multidetector CT imaging of the cervical spine was performed. Multiplanar CT image reconstructions were also generated.  Comparison: Plain films 07/15/2011  Findings: The patient is status post anterior fusion from C3-C5. Posterior fusion changes are noted from C3-C7.  There is diffuse osteopenia.  No evidence of hardware  loosening or infection.  Bony fusion also noted across the C5-6 disc space.  Severe degenerative disc disease at C6-7.  Prevertebral soft tissues are normal.  No fracture or malalignment.  No epidural or paraspinal hematoma.  IMPRESSION: Postoperative changes from anterior and posterior fusion as above. No hardware complicating feature.  Diffuse osteopenia throughout the cervical spine.  Severe degenerative disc disease C6-7.  No definite acute process.   Original Report Authenticated By: Charlett Nose, M.D.      1. DDD (degenerative disc disease), cervical       MDM  Severe DDD shown on CT.  Prominent spasm found with palpation.  Patient without neurological deficits. Relief with diazepam.  Patient instructed to follow-up with Dr. Wynetta Emery.  Return to ED if symptoms worsen. Case reviewed with Dr. Devoria Albe.  Patient acknowledges and agrees with this treatment plan.  States that he has appt with dr cram tomorrow.       Shondell Poulson, PA-C 06/12/12 1332  Keyshia Orwick, PA-C 06/12/12 1335

## 2012-06-12 NOTE — ED Provider Notes (Signed)
See prior note   Ward Givens, MD 06/12/12 [phone]

## 2012-06-12 NOTE — ED Provider Notes (Signed)
Patient has had surgery in his cervical and lumbar spine in the past by Dr. Wynetta Emery. He reports over the weekend he started having pain in his neck. He indicates the lateral paraspinous muscles and the site the muscles attach to the skull as were his pain is located. Patient noted to have a moderate soft tissue loss in his midline cervical spine and a well-healed surgical scar. He has pain with range of motion of his head to the left into the right and when he looks inferiorly towards his toes. He has minimal discomfort when he looks superiorly towards the ceiling.   Medical screening examination/treatment/procedure(s) were conducted as a shared visit with non-physician practitioner(s) and myself.  I personally evaluated the patient during the encounter  Devoria Albe, MD, Franz Dell, MD 06/12/12 1059

## 2012-06-12 NOTE — ED Notes (Signed)
Pt has had multiple neck and back surgeries per Dr. Wynetta Emery. Started Friday with pain in neck. Denies radiation to arms, denies "unusual" numbness in hands. Pt states he does not like to take pain meds. Took muscle relaxer this am and BC powder.

## 2013-04-29 DIAGNOSIS — R918 Other nonspecific abnormal finding of lung field: Secondary | ICD-10-CM | POA: Diagnosis not present

## 2013-04-29 DIAGNOSIS — J189 Pneumonia, unspecified organism: Secondary | ICD-10-CM | POA: Diagnosis not present

## 2013-04-29 DIAGNOSIS — J449 Chronic obstructive pulmonary disease, unspecified: Secondary | ICD-10-CM | POA: Diagnosis not present

## 2013-04-29 DIAGNOSIS — R059 Cough, unspecified: Secondary | ICD-10-CM | POA: Diagnosis not present

## 2013-04-29 DIAGNOSIS — R05 Cough: Secondary | ICD-10-CM | POA: Diagnosis not present

## 2013-05-03 DIAGNOSIS — J189 Pneumonia, unspecified organism: Secondary | ICD-10-CM | POA: Diagnosis not present

## 2013-05-03 DIAGNOSIS — J449 Chronic obstructive pulmonary disease, unspecified: Secondary | ICD-10-CM | POA: Diagnosis not present

## 2013-05-07 DIAGNOSIS — J189 Pneumonia, unspecified organism: Secondary | ICD-10-CM | POA: Diagnosis not present

## 2013-05-07 DIAGNOSIS — J449 Chronic obstructive pulmonary disease, unspecified: Secondary | ICD-10-CM | POA: Diagnosis not present

## 2013-05-10 ENCOUNTER — Encounter: Payer: Self-pay | Admitting: Internal Medicine

## 2013-05-10 ENCOUNTER — Ambulatory Visit (INDEPENDENT_AMBULATORY_CARE_PROVIDER_SITE_OTHER): Payer: Medicare Other | Admitting: Internal Medicine

## 2013-05-10 VITALS — BP 120/60 | HR 71 | Temp 97.7°F | Ht 65.0 in | Wt 207.2 lb

## 2013-05-10 DIAGNOSIS — J439 Emphysema, unspecified: Secondary | ICD-10-CM | POA: Insufficient documentation

## 2013-05-10 DIAGNOSIS — J4489 Other specified chronic obstructive pulmonary disease: Secondary | ICD-10-CM

## 2013-05-10 DIAGNOSIS — J449 Chronic obstructive pulmonary disease, unspecified: Secondary | ICD-10-CM

## 2013-05-10 NOTE — Progress Notes (Addendum)
   Subjective:    Patient ID: Evan HillockRichard A Lococo, male    DOB: 22-Nov-1940  MRN: [id number]  HPI  72 yowm quit smoking 2011 @ wt 195 without resp complaints until Jan 2015 with dx of pna and copd rx by Dr Kathrynn RunningManning and referred 05/10/2013 to pulmonary clinic for eval of copd.  Chief Complaint  Patient presents with  . Pulmonary Consult    Referred per Dr. Aleatha BorerJames Manning. Pt states recently dxed with PNA and Emphysema. He c/o cough and SOB for the past 6-8 months.  Cough is prod with occ clear sputum.      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 He has some am cough but easily clears and not purulent, worse in winter, x 6 m.  No obvious day to day or daytime variabilty or assoc  cp or chest tightness, subjective wheeze overt sinus   symptoms. No unusual exp hx or h/o childhood pna/ asthma or knowledge of premature birth.  Sleeping ok without nocturnal  exacerbation  of respiratory  c/o's or need for noct saba. Also denies any obvious fluctuation of symptoms with weather or environmental changes or other aggravating or alleviating factors except as outlined above   Current Medications, Allergies, Complete Past Medical History, Past Surgical History, Family History, and Social History were reviewed in [facility] electronic medical record.          Review of Systems  Constitutional: Negative for fever, chills, activity change, appetite change and unexpected weight change.  HENT: Positive for sore throat. Negative for congestion, dental problem, postnasal drip, rhinorrhea, sneezing, trouble swallowing and voice change.   Eyes: Negative for visual disturbance.  Respiratory: Positive for cough and shortness of breath. Negative for choking.   Cardiovascular: Negative for  chest pain and leg swelling.  Gastrointestinal: Negative for nausea, vomiting and abdominal pain.  Genitourinary: Negative for difficulty urinating.       Acid heartburn Indigestion  Musculoskeletal: Negative for arthralgias.  Skin: Negative for rash.  Psychiatric/Behavioral: Negative for behavioral problems and confusion.       Objective:   Physical Exam   Wt Readings from Last 3 Encounters:  05/10/13 207 lb 3.2 oz (93.985 kg)  10/28/11 196 lb 11.2 oz (89.223 kg)  09/20/11 189 lb 9.6 oz (86.002 kg)     Mod obese pleasant wm nad  HEENT mild turbinate edema.  Oropharynx no thrush or excess pnd or cobblestoning.  No JVD or cervical adenopathy. Mild accessory muscle hypertrophy. Trachea midline, nl thryroid. Chest was hyperinflated by percussion with diminished breath sounds and moderate increased exp time without wheeze. Hoover sign positive at end inspiration. Regular rate and rhythm without murmur gallop or rub or increase P2 or edema.  Abd: obese, no hsm, nl excursion. Ext warm without cyanosis or clubbing.        May 07 2013 report  ? Scarring LLL  Last cxr in system 10/28/11  C/w bullous emphysema s acute finding      Assessment & Plan:

## 2013-05-10 NOTE — Patient Instructions (Addendum)
Only use your albuterol (proair) as a rescue medication to be used if you can't catch your breath by resting or doing a relaxed purse lip breathing pattern.  - The less you use it, the better it will work when you need it. - Ok to use up to 2 puffs  every 4 hours if you must but call for immediate appointment if use goes up over your usual need - Don't leave home without it !!  (think of it like the spare tire for your car)   Please schedule a follow up office visit in 4 weeks, sooner if needed with pfts to determine the severity of your "emphysema" which I doubt is severe

## 2013-05-11 NOTE — Assessment & Plan Note (Signed)
Apparent aecopd already approp treated with ? Severity underlying copd in remote smoker with emphysema on cxr  As I explained to this patient in detail:  although there may be significant copd present, it does not appear to be limiting activity tolerance any more than a set of worn tires limits someone from driving a car  around a parking lot.  A new set of Michelins might look good but would have no perceived impact on the performance of the car and would not be worth the cost.  For now therefore rec he just use the saba prn and f/u with full pfts.

## 2013-05-24 DIAGNOSIS — R05 Cough: Secondary | ICD-10-CM | POA: Diagnosis not present

## 2013-05-24 DIAGNOSIS — J189 Pneumonia, unspecified organism: Secondary | ICD-10-CM | POA: Diagnosis not present

## 2013-05-24 DIAGNOSIS — R059 Cough, unspecified: Secondary | ICD-10-CM | POA: Diagnosis not present

## 2013-05-24 DIAGNOSIS — R0602 Shortness of breath: Secondary | ICD-10-CM | POA: Diagnosis not present

## 2013-06-11 ENCOUNTER — Ambulatory Visit: Payer: Medicare Other | Admitting: Internal Medicine

## 2013-09-12 DIAGNOSIS — H2510 Age-related nuclear cataract, unspecified eye: Secondary | ICD-10-CM | POA: Diagnosis not present

## 2013-10-11 DIAGNOSIS — E785 Hyperlipidemia, unspecified: Secondary | ICD-10-CM | POA: Diagnosis not present

## 2013-10-11 DIAGNOSIS — R3916 Straining to void: Secondary | ICD-10-CM | POA: Diagnosis not present

## 2013-10-11 DIAGNOSIS — K625 Hemorrhage of anus and rectum: Secondary | ICD-10-CM | POA: Diagnosis not present

## 2013-10-11 DIAGNOSIS — R079 Chest pain, unspecified: Secondary | ICD-10-CM | POA: Diagnosis not present

## 2013-10-11 DIAGNOSIS — I10 Essential (primary) hypertension: Secondary | ICD-10-CM | POA: Diagnosis not present

## 2013-10-11 DIAGNOSIS — N400 Enlarged prostate without lower urinary tract symptoms: Secondary | ICD-10-CM | POA: Diagnosis not present

## 2013-10-11 DIAGNOSIS — IMO0002 Reserved for concepts with insufficient information to code with codable children: Secondary | ICD-10-CM | POA: Diagnosis not present

## 2013-11-15 ENCOUNTER — Ambulatory Visit (INDEPENDENT_AMBULATORY_CARE_PROVIDER_SITE_OTHER): Payer: Medicare Other | Admitting: Internal Medicine

## 2013-11-15 ENCOUNTER — Encounter: Payer: Self-pay | Admitting: Internal Medicine

## 2013-11-15 VITALS — BP 132/70 | HR 65 | Ht 65.0 in | Wt 210.0 lb

## 2013-11-15 DIAGNOSIS — R0989 Other specified symptoms and signs involving the circulatory and respiratory systems: Secondary | ICD-10-CM

## 2013-11-15 DIAGNOSIS — G4719 Other hypersomnia: Secondary | ICD-10-CM

## 2013-11-15 DIAGNOSIS — R0602 Shortness of breath: Secondary | ICD-10-CM | POA: Diagnosis not present

## 2013-11-15 DIAGNOSIS — R0681 Apnea, not elsewhere classified: Secondary | ICD-10-CM

## 2013-11-15 DIAGNOSIS — R609 Edema, unspecified: Secondary | ICD-10-CM | POA: Diagnosis not present

## 2013-11-15 DIAGNOSIS — R5381 Other malaise: Secondary | ICD-10-CM

## 2013-11-15 DIAGNOSIS — Z79899 Other long term (current) drug therapy: Secondary | ICD-10-CM | POA: Diagnosis not present

## 2013-11-15 DIAGNOSIS — G4710 Hypersomnia, unspecified: Secondary | ICD-10-CM

## 2013-11-15 DIAGNOSIS — R0609 Other forms of dyspnea: Secondary | ICD-10-CM

## 2013-11-15 DIAGNOSIS — R600 Localized edema: Secondary | ICD-10-CM | POA: Insufficient documentation

## 2013-11-15 DIAGNOSIS — R5383 Other fatigue: Secondary | ICD-10-CM

## 2013-11-15 DIAGNOSIS — E669 Obesity, unspecified: Secondary | ICD-10-CM

## 2013-11-15 DIAGNOSIS — R0683 Snoring: Secondary | ICD-10-CM | POA: Insufficient documentation

## 2013-11-15 NOTE — Patient Instructions (Signed)
Your physician has requested that you have an echocardiogram. Echocardiography is a painless test that uses sound waves to create images of your heart. It provides your doctor with information about the size and shape of your heart and how well your heart's chambers and valves are working. This procedure takes approximately one hour. There are no restrictions for this procedure.  Your physician has recommended that you have a sleep study. This test records several body functions during sleep, including: brain activity, eye movement, oxygen and carbon dioxide blood levels, heart rate and rhythm, breathing rate and rhythm, the flow of air through your mouth and nose, snoring, body muscle movements, and chest and belly movement. ** [facility]  Your physician recommends that you return for lab work in: TODAY, if possible  Your physician recommends that you schedule a follow-up appointment in a few weeks to a month - after your tests.

## 2013-11-15 NOTE — Progress Notes (Signed)
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 He presents today for evaluation of lower extremity swelling and some shortness of breath. His shortness of breath mostly with exertion and not positional however he's noted over the past several weeks to develop worsening lower extremity edema in both legs. He has had significant weight gain and the fluid was increased up to his knees. He was started on Lasix and is taking 40 mg twice daily. Over the past several days he's lost 7 pounds and noted a marked improvement in his symptoms. He says history of some daytime fatigue and per his wife's report snoring and witnessed episodes of apnea. He also reports increased abdominal girth at the same time he had leg swelling. He denies any chest pain. He does have a history of smoking approximately 2 packs per day for 45 years although he says he doesn't always finish all of his cigarettes.  He was recently evaluated by pulmonologist for COPD and thought not to significant COPD, but that it was not worrisome. I do not believe there was discussion about possible sleep apnea.  PMHx:  Past Medical History  Diagnosis Date  . GERD (gastroesophageal reflux disease)   . Depression   . BPH (benign prostatic hyperplasia)   . Migraine   . COPD (chronic obstructive pulmonary disease)   . Arthritis     spine  . Gout, arthritis     feet  . Hypertension   . Shortness of breath     exertional  . Diverticular disease of colon   . Hard of hearing     does not wear hearing aides  . UTI (lower urinary tract infection)     finishing treatment for UTI  . Cough 10/28/11    productive cough    Past Surgical History  Procedure Laterality Date  . Neck surgery  2009    Cervical Fusion x3  . Ulnar nerve repair  2010  . Tonsillectomy    . Cholecystectomy  10+ years  ago  . Cardiovascular stress test  18 YEARS ago  . Cervical fusion      x 5  . Back surgery    . Lumbar fusion  2011  . Hernia repair    . Posterior cervical fusion/foraminotomy  06/09/2011    Procedure: POSTERIOR CERVICAL FUSION/FORAMINOTOMY LEVEL 4;  Surgeon: Mariam Dollar, MD;  Location: [facility];  Service: Neurosurgery;  Laterality: N/A;  Cervical three to seven  posterior cervical fusion, Cervical four to seven Laminectomy, bone graft from right iliac crest   . Lumbar laminectomy/decompression microdiscectomy  10/29/2011    Procedure: LUMBAR LAMINECTOMY/DECOMPRESSION MICRODISCECTOMY 1 LEVEL;  Surgeon: Mariam Dollar, MD;  Location: [facility];  Service: Neurosurgery;  Laterality: Right;  Right Lumbar Three-Four Lumbar Laminectomy/Microdiscectomy    FAMHx:  History reviewed. No pertinent family history.  SOCHx:   reports that he has been smoking Cigarettes.  He has a 100 pack-year smoking history. He uses smokeless tobacco. He reports that he does not drink alcohol or use illicit drugs.  ALLERGIES:  Allergies  Allergen Reactions  . Cephalosporins Other (See Comments)    Oral thrush  . Oxycodone Itching    ROS: A comprehensive review of systems was negative except for: Respiratory: positive for dyspnea on exertion Cardiovascular: positive for lower extremity edema  HOME MEDS:  Current Outpatient Prescriptions  Medication Sig Dispense Refill  . furosemide (LASIX) 40 MG tablet Take 40 mg by mouth 2 (two) times daily.      . lansoprazole (PREVACID) 15 MG capsule Take 15 mg by mouth daily at 12 noon.      . Multiple Vitamins-Minerals (CENTRUM SILVER ADULT 50+ PO) Take 1 tablet by mouth daily.      . naproxen sodium (ANAPROX) 220 MG tablet Take 220 mg by mouth 2 (two) times daily with a meal.      . niacin 500 MG tablet Take 500 mg by mouth at bedtime.      . Omega-3 Fatty Acids (FISH OIL) 1200 MG CAPS Take 2,400 mg by mouth daily.      . potassium chloride (MICRO-K) 10 MEQ CR  capsule Take 10 mEq by mouth daily.      . sertraline (ZOLOFT) 25 MG tablet Take 25 mg by mouth daily.      . tamsulosin (FLOMAX) 0.4 MG CAPS capsule Take 0.4 mg by mouth daily.       No current facility-administered medications for this visit.    LABS/IMAGING: No results found for this or any previous visit (from the past 48 hour(s)). No results found.  VITALS: BP 132/70  Pulse 65  Ht 5\' 5"  (1.651 m)  Wt 210 lb (95.255 kg)  BMI 34.95 kg/m2  EXAM: General appearance: alert and no distress Neck: no JVD and supple, symmetrical, trachea midline Lungs: diminished breath sounds bilaterally Heart: regular rate and rhythm, S1, S2 normal, no murmur, click, rub or gallop Abdomen: soft, non-tender; bowel sounds normal; no masses,  no organomegaly and protuberant Extremities: edema 1+ bilateral pitting edema Pulses: 2+ and symmetric Skin: Skin color, texture, turgor normal. No rashes or lesions Neurologic: Grossly normal Psych: Normal  EKG: Normal sinus rhythm at 65  ASSESSMENT: 1. Dyspnea and exertion with possible COPD 2. Lower extremity edema and increased abdominal girth 3. Possible obstructive sleep apnea  PLAN: 1.   Mr. Evan [name] has had several weeks of increasing lower extremity edema and abdominal girth with some shortness of breath. These symptoms have improved with diuretics. He probably has underlying COPD and was seen by pulmonologist who felt that no additional therapy was necessary. I'm concerned about possible high pulmonary pressures in the setting of undiagnosed sleep apnea and resultant right heart failure. His symptoms seem to be consistent with that. For now I recommend he stays on his current dose of diuretics. I would recommend check an echocardiogram to evaluate left and right ventricular function is well as pulmonary pressures. We will obtain a BMP and BNP in plan to see him back to discuss the echocardiogram results in a few weeks.  Thanks for the kind  referral.  Evan NoseKenneth C. Evamae Rowen, MD, [facility] Attending Cardiologist [facility]  Evan [name] C 11/15/2013, 2:19 PM

## 2013-11-16 LAB — BASIC METABOLIC PANEL
BUN: 18 mg/dL (ref 6–23)
CHLORIDE: 104 meq/L (ref 96–112)
CO2: 28 meq/L (ref 19–32)
CREATININE: 0.88 mg/dL (ref 0.50–1.35)
Calcium: 9.1 mg/dL (ref 8.4–10.5)
Glucose, Bld: 83 mg/dL (ref 70–99)
POTASSIUM: 4.1 meq/L (ref 3.5–5.3)
SODIUM: 141 meq/L (ref 135–145)

## 2013-11-17 LAB — BRAIN NATRIURETIC PEPTIDE: BRAIN NATRIURETIC PEPTIDE: 25.7 pg/mL (ref 0.0–100.0)

## 2013-11-27 ENCOUNTER — Ambulatory Visit (HOSPITAL_COMMUNITY)
Admission: RE | Admit: 2013-11-27 | Discharge: 2013-11-27 | Disposition: A | Discharge status: Home / Self Care | Payer: Medicare Other | Source: Ambulatory Visit | Attending: Cardiology | Admitting: Cardiology

## 2013-11-27 DIAGNOSIS — R609 Edema, unspecified: Secondary | ICD-10-CM | POA: Diagnosis not present

## 2013-11-27 DIAGNOSIS — I059 Rheumatic mitral valve disease, unspecified: Secondary | ICD-10-CM | POA: Diagnosis not present

## 2013-11-27 DIAGNOSIS — J4489 Other specified chronic obstructive pulmonary disease: Secondary | ICD-10-CM | POA: Insufficient documentation

## 2013-11-27 DIAGNOSIS — R0989 Other specified symptoms and signs involving the circulatory and respiratory systems: Principal | ICD-10-CM | POA: Insufficient documentation

## 2013-11-27 DIAGNOSIS — R0609 Other forms of dyspnea: Secondary | ICD-10-CM | POA: Insufficient documentation

## 2013-11-27 DIAGNOSIS — J449 Chronic obstructive pulmonary disease, unspecified: Secondary | ICD-10-CM | POA: Diagnosis not present

## 2013-11-27 DIAGNOSIS — R0602 Shortness of breath: Secondary | ICD-10-CM

## 2013-11-27 DIAGNOSIS — R600 Localized edema: Secondary | ICD-10-CM

## 2013-11-27 HISTORY — PX: TRANSTHORACIC ECHOCARDIOGRAM: SHX275

## 2013-11-27 NOTE — Progress Notes (Signed)
2D Echo Performed 11/27/2013    Lovinia Snare, RCS  

## 2013-12-04 ENCOUNTER — Telehealth: Payer: Self-pay | Admitting: Internal Medicine

## 2013-12-04 NOTE — Telephone Encounter (Signed)
Deferred to  [name]

## 2013-12-04 NOTE — Telephone Encounter (Signed)
Patient would like Echo results.

## 2013-12-04 NOTE — Telephone Encounter (Signed)
Pt called again for his results.

## 2013-12-05 NOTE — Telephone Encounter (Signed)
Patient notified of echo results. Reminded of OV 9/15

## 2014-01-01 ENCOUNTER — Encounter: Payer: Self-pay | Admitting: Internal Medicine

## 2014-01-01 ENCOUNTER — Ambulatory Visit (INDEPENDENT_AMBULATORY_CARE_PROVIDER_SITE_OTHER): Payer: Medicare Other | Admitting: Internal Medicine

## 2014-01-01 VITALS — BP 108/60 | HR 96 | Ht 65.0 in | Wt 216.1 lb

## 2014-01-01 DIAGNOSIS — R0609 Other forms of dyspnea: Secondary | ICD-10-CM

## 2014-01-01 DIAGNOSIS — R609 Edema, unspecified: Secondary | ICD-10-CM | POA: Diagnosis not present

## 2014-01-01 DIAGNOSIS — G629 Polyneuropathy, unspecified: Secondary | ICD-10-CM

## 2014-01-01 DIAGNOSIS — G609 Hereditary and idiopathic neuropathy, unspecified: Secondary | ICD-10-CM

## 2014-01-01 DIAGNOSIS — I5032 Chronic diastolic (congestive) heart failure: Secondary | ICD-10-CM

## 2014-01-01 DIAGNOSIS — R0989 Other specified symptoms and signs involving the circulatory and respiratory systems: Secondary | ICD-10-CM | POA: Diagnosis not present

## 2014-01-01 DIAGNOSIS — R600 Localized edema: Secondary | ICD-10-CM

## 2014-01-01 DIAGNOSIS — I5030 Unspecified diastolic (congestive) heart failure: Secondary | ICD-10-CM | POA: Insufficient documentation

## 2014-01-01 NOTE — Patient Instructions (Signed)
Knee High Compression Stockings - [place] 20-60mmHg  Your physician wants you to follow-up in: 6 months. You will receive a reminder letter in the mail two months in advance. If you don't receive a letter, please call our office to schedule the follow-up appointment.

## 2014-01-01 NOTE — Progress Notes (Signed)
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 He presents today for evaluation of lower extremity swelling and some shortness of breath. His shortness of breath mostly with exertion and not positional however he's noted over the past several weeks to develop worsening lower extremity edema in both legs. He has had significant weight gain and the fluid was increased up to his knees. He was started on Lasix and is taking 40 mg twice daily. Over the past several days he's lost 7 pounds and noted a marked improvement in his symptoms. He says history of some daytime fatigue and per his wife's report snoring and witnessed episodes of apnea. He also reports increased abdominal girth at the same time he had leg swelling. He denies any chest pain. He does have a history of smoking approximately 2 packs per day for 45 years although he says he doesn't always finish all of his cigarettes.  He was recently evaluated by pulmonologist for COPD and thought not to significant COPD, but that it was not worrisome. I do not believe there was discussion about possible sleep apnea.  Evan [name] returns today for followup. His echocardiogram shows low-normal systolic function with EF 50-55%, mild valvular heart disease as well as diastolic dysfunction. Pulmonary pressures were not significantly elevated. His metabolic profile was normal with normal renal function. His BNP was very low at 25. This is after starting Lasix 40 mg twice daily. He reports that his leg swelling goes away completely at night and comes back throughout the day. I suspect most of his edema is secondary to peripheral neuropathy. He continues to have problems with his back and may be seeing his neurosurgeon again.  PMHx:  Past Medical History  Diagnosis Date  . GERD (gastroesophageal reflux disease)     . Depression   . BPH (benign prostatic hyperplasia)   . Migraine   . COPD (chronic obstructive pulmonary disease)   . Arthritis     spine  . Gout, arthritis     feet  . Hypertension   . Shortness of breath     exertional  . Diverticular disease of colon   . Hard of hearing     does not wear hearing aides  . UTI (lower urinary tract infection)     finishing treatment for UTI  . Cough 10/28/11    productive cough    Past Surgical History  Procedure Laterality Date  . Neck surgery  2009    Cervical Fusion x3  . Ulnar nerve repair  2010  . Tonsillectomy    . Cholecystectomy  10+ years ago  . Cardiovascular stress test  18 YEARS ago  . Cervical fusion      x 5  . Back surgery    . Lumbar fusion  2011  . Hernia repair    . Posterior cervical fusion/foraminotomy  06/09/2011    Procedure: POSTERIOR CERVICAL FUSION/FORAMINOTOMY LEVEL 4;  Surgeon: Mariam Dollar, MD;  Location: [facility];  Service: Neurosurgery;  Laterality: N/A;  Cervical three to seven  posterior cervical fusion, Cervical four to seven Laminectomy, bone graft from right iliac crest   . Lumbar laminectomy/decompression microdiscectomy  10/29/2011    Procedure: LUMBAR LAMINECTOMY/DECOMPRESSION MICRODISCECTOMY 1 LEVEL;  Surgeon: Mariam Dollar, MD;  Location: [facility];  Service: Neurosurgery;  Laterality: Right;  Right Lumbar  Three-Four Lumbar Laminectomy/Microdiscectomy    FAMHx:  History reviewed. No pertinent family history.  SOCHx:   reports that he has been smoking Cigarettes.  He has a 100 pack-year smoking history. He uses smokeless tobacco. He reports that he does not drink alcohol or use illicit drugs.  ALLERGIES:  Allergies  Allergen Reactions  . Cephalosporins Other (See Comments)    Oral thrush  . Oxycodone Itching    ROS: A comprehensive review of systems was negative except for: Respiratory: positive for dyspnea on exertion Cardiovascular: positive for lower extremity edema  HOME  MEDS: Current Outpatient Prescriptions  Medication Sig Dispense Refill  . furosemide (LASIX) 40 MG tablet Take 40 mg by mouth 2 (two) times daily.      . lansoprazole (PREVACID) 15 MG capsule Take 15 mg by mouth daily at 12 noon.      . Multiple Vitamins-Minerals (CENTRUM SILVER ADULT 50+ PO) Take 1 tablet by mouth daily.      . naproxen sodium (ALEVE) 220 MG tablet Take 220 mg by mouth 2 (two) times daily with a meal.      . niacin 500 MG tablet Take 500 mg by mouth at bedtime.      . Omega-3 Fatty Acids (FISH OIL) 1200 MG CAPS Take 2,400 mg by mouth daily.      . potassium chloride (MICRO-K) 10 MEQ CR capsule Take 10 mEq by mouth daily.      . sertraline (ZOLOFT) 25 MG tablet Take 25 mg by mouth daily.      . tamsulosin (FLOMAX) 0.4 MG CAPS capsule Take 0.4 mg by mouth daily.       No current facility-administered medications for this visit.    LABS/IMAGING: No results found for this or any previous visit (from the past 48 hour(s)). No results found.  VITALS: BP 108/60  Pulse 96  Ht  (1.651 m)  Wt 216 lb 1.6 oz (98.022 kg)  BMI 35.96 kg/m2  EXAM: deferred  EKG: deferred  ASSESSMENT: 1. Dyspnea and exertion with possible COPD 2. Lower extremity edema and increased abdominal girth 3. Possible obstructive sleep apnea  PLAN: 1.   Evan [name] could possibly have some mild diastolic heart failure. He appears to be well compensated with a low BNP on 40 mg of Lasix twice daily. This is held somewhat with his lower sternum the swelling however most of it is probably due to peripheral neuropathy. I think he would benefit from compression stockings, 20-30 mmHg bilateral knee-high compression. He should remain in his current dose of diuretics.  Plan to see him back in 6 months.  Chrystie Nose, MD, [facility] Attending Cardiologist [facility]  Niclas Markell C 01/01/2014, 4:28 PM

## 2014-01-15 DIAGNOSIS — Z23 Encounter for immunization: Secondary | ICD-10-CM | POA: Diagnosis not present

## 2014-03-04 DIAGNOSIS — I10 Essential (primary) hypertension: Secondary | ICD-10-CM | POA: Diagnosis not present

## 2014-03-04 DIAGNOSIS — M519 Unspecified thoracic, thoracolumbar and lumbosacral intervertebral disc disorder: Secondary | ICD-10-CM | POA: Diagnosis not present

## 2014-03-04 DIAGNOSIS — E1169 Type 2 diabetes mellitus with other specified complication: Secondary | ICD-10-CM | POA: Diagnosis not present

## 2014-03-04 DIAGNOSIS — R609 Edema, unspecified: Secondary | ICD-10-CM | POA: Diagnosis not present

## 2014-03-04 DIAGNOSIS — N400 Enlarged prostate without lower urinary tract symptoms: Secondary | ICD-10-CM | POA: Diagnosis not present

## 2014-03-04 DIAGNOSIS — M1990 Unspecified osteoarthritis, unspecified site: Secondary | ICD-10-CM | POA: Diagnosis not present

## 2014-05-28 ENCOUNTER — Other Ambulatory Visit: Payer: Self-pay | Admitting: Neurosurgery

## 2014-06-17 ENCOUNTER — Other Ambulatory Visit (HOSPITAL_COMMUNITY): Payer: Medicare Other

## 2014-06-18 ENCOUNTER — Other Ambulatory Visit: Payer: Self-pay | Admitting: Neurosurgery

## 2014-06-24 ENCOUNTER — Inpatient Hospital Stay: Admit: 2014-06-24 | Payer: Self-pay | Admitting: Neurosurgery

## 2014-06-24 SURGERY — FOR MAXIMUM ACCESS (MAS) POSTERIOR LUMBAR INTERBODY FUSION (PLIF) 1 LEVEL
Anesthesia: General | Site: Back

## 2014-07-11 ENCOUNTER — Encounter (HOSPITAL_COMMUNITY): Payer: Self-pay | Admitting: Pharmacy Technician

## 2014-07-15 ENCOUNTER — Encounter (HOSPITAL_COMMUNITY)
Admission: RE | Admit: 2014-07-15 | Discharge: 2014-07-15 | Disposition: A | Discharge status: Home / Self Care | Payer: Medicare Other | Source: Ambulatory Visit | Attending: Neurosurgery | Admitting: Neurosurgery

## 2014-07-15 ENCOUNTER — Encounter (HOSPITAL_COMMUNITY): Payer: Self-pay

## 2014-07-15 DIAGNOSIS — Z01812 Encounter for preprocedural laboratory examination: Secondary | ICD-10-CM | POA: Diagnosis present

## 2014-07-15 DIAGNOSIS — E785 Hyperlipidemia, unspecified: Secondary | ICD-10-CM | POA: Insufficient documentation

## 2014-07-15 HISTORY — DX: Dorsalgia, unspecified: M54.9

## 2014-07-15 HISTORY — DX: Other chronic pain: G89.29

## 2014-07-15 HISTORY — DX: Hyperlipidemia, unspecified: E78.5

## 2014-07-15 HISTORY — DX: Anesthesia of skin: R20.0

## 2014-07-15 LAB — BASIC METABOLIC PANEL
ANION GAP: 11 (ref 5–15)
BUN: 17 mg/dL (ref 6–23)
CALCIUM: 8.8 mg/dL (ref 8.4–10.5)
CO2: 23 mmol/L (ref 19–32)
Chloride: 104 mmol/L (ref 96–112)
Creatinine, Ser: 0.77 mg/dL (ref 0.50–1.35)
GFR calc non Af Amer: 88 mL/min — ABNORMAL LOW (ref >90)
Glucose, Bld: 136 mg/dL — ABNORMAL HIGH (ref 70–99)
POTASSIUM: 3.9 mmol/L (ref 3.5–5.1)
SODIUM: 138 mmol/L (ref 135–145)

## 2014-07-15 LAB — CBC
HEMATOCRIT: 39.4 % (ref 39.0–52.0)
HEMOGLOBIN: 13.2 g/dL (ref 13.0–17.0)
MCH: 31.1 pg (ref 26.0–34.0)
MCHC: 33.5 g/dL (ref 30.0–36.0)
MCV: 92.7 fL (ref 78.0–100.0)
PLATELETS: 251 10*3/uL (ref 150–400)
RBC: 4.25 MIL/uL (ref 4.22–5.81)
RDW: 13.2 % (ref 11.5–15.5)
WBC: 8.1 10*3/uL (ref 4.0–10.5)

## 2014-07-15 LAB — ABO/RH: ABO/RH(D): O NEG

## 2014-07-15 LAB — TYPE AND SCREEN
ABO/RH(D): O NEG
ANTIBODY SCREEN: NEGATIVE

## 2014-07-15 LAB — SURGICAL PCR SCREEN
MRSA, PCR: NEGATIVE
Staphylococcus aureus: NEGATIVE

## 2014-07-15 NOTE — Progress Notes (Signed)
   07/15/14 1114  OBSTRUCTIVE SLEEP APNEA  Have you ever been diagnosed with sleep apnea through a sleep study? No  Do you snore loudly (loud enough to be heard through closed doors)?  1  Do you often feel tired, fatigued, or sleepy during the daytime? 0  Has anyone observed you stop breathing during your sleep? 1  Do you have, or are you being treated for high blood pressure? 0  BMI more than 35 kg/m2? 0  Age over 74 years old? 1  Neck circumference greater than 40 cm/16 inches? 1 (18.5)  Gender: 1

## 2014-07-15 NOTE — Progress Notes (Addendum)
Echo report in epic from 2015  Stress test report in epic from 2000  Denies ever having a heart cath  EKG in epic from 11-15-13  Dr.Dwight Mayford KnifeWilliams is Medical Md  Cardiologist is Dr.Hilty with last visit in epic from 10/2013  Denies CXR in past yr

## 2014-07-15 NOTE — Pre-Procedure Instructions (Signed)
Cinch A Erekson  07/15/2014   Your procedure is scheduled on:  Mon, April 4 @ 7:30 AM  [facility]  at 5:30 AM.  Call this number if you have problems the morning of surgery: [phone]   Remember:   Do not eat food or drink liquids after midnight.   Take these medicines the morning of surgery with A SIP OF WATER: Pain Pill(if needed),Omeprazole(Prilosec),Zoloft(Sertraline),and Flomax(Tamsulosin)              Stop taking your Fish Oil and any Vitamins or Herbal Medications. Also No Goody's,BC's,Aleve,Aspirin,or Ibuprofen.    Do not wear jewelry.  Do not wear lotions, powders, or colognes. You may wear deodorant.  Men may shave face and neck.  Do not bring valuables to the hospital.  [facility] is not responsible                  for any belongings or valuables.               Contacts, dentures or bridgework may not be worn into surgery.  Leave suitcase in the car. After surgery it may be brought to your room.  For patients admitted to the hospital, discharge time is determined by your                treatment team.                  Special Instructions:  [place] - Preparing for Surgery  Before surgery, you can play an important role.  Because skin is not sterile, your skin needs to be as free of germs as possible.  You can reduce the number of germs on you skin by washing with CHG (chlorahexidine gluconate) soap before surgery.  CHG is an antiseptic cleaner which kills germs and bonds with the skin to continue killing germs even after washing.  Please DO NOT use if you have an allergy to CHG or antibacterial soaps.  If your skin becomes reddened/irritated stop using the CHG and inform your nurse when you arrive at Short Stay.  Do not shave (including legs and underarms) for at least 48 hours prior to the first CHG shower.  You may shave your face.  Please follow these instructions carefully:   1.  Shower with CHG Soap the night before surgery and  the                                morning of Surgery.  2.  If you choose to wash your hair, wash your hair first as usual with your       normal shampoo.  3.  After you shampoo, rinse your hair and body thoroughly to remove the                      Shampoo.  4.  Use CHG as you would any other liquid soap.  You can apply chg directly       to the skin and wash gently with scrungie or a clean washcloth.  5.  Apply the CHG Soap to your body ONLY FROM THE NECK DOWN.        Do not use on open wounds or open sores.  Avoid contact with your eyes,       ears, mouth and genitals (private parts).  Wash genitals (private parts)  with your normal soap.  6.  Wash thoroughly, paying special attention to the area where your surgery        will be performed.  7.  Thoroughly rinse your body with warm water from the neck down.  8.  DO NOT shower/wash with your normal soap after using and rinsing off       the CHG Soap.  9.  Pat yourself dry with a clean towel.            10.  Wear clean pajamas.            11.  Place clean sheets on your bed the night of your first shower and do not        sleep with pets.  Day of Surgery  Do not apply any lotions/deoderants the morning of surgery.  Please wear clean clothes to the hospital/surgery center.     Please read over the following fact sheets that you were given: Pain Booklet, Coughing and Deep Breathing, Blood Transfusion Information, MRSA Information and Surgical Site Infection Prevention

## 2014-07-15 NOTE — Progress Notes (Signed)
Anesthesia Chart Review:  Pt is [age] year old male scheduled for maximum access PLIF on 07/22/2014 with Dr. Wynetta Emeryram.   Cardiologist is Dr. Rennis GoldenHilty.   PMH includes: hyperlipidemia, peripheral edema. Current smoker. BMI 34. S/p lumbar laminectomy/decompression microdiscectomy on 10/29/11; s/p posterior cervical fusion/foraminotomy 06/09/11.   Preoperative labs reviewed.    EKG 11/15/2013: NSR.   Echo 11/27/2013: - Left ventricle: The cavity size was normal. Wall thickness was normal. Systolic function was normal. The estimated ejection fraction was in the range of 50% to 55%. Wall motion was normal; there were no regional wall motion abnormalities. Doppler parameters are consistent with abnormal left ventricular relaxation (grade 1 diastolic dysfunction). - Aortic valve: There was trivial regurgitation. - Mitral valve: There was mild regurgitation. - Pulmonary arteries: Systolic pressure was mildly increased. PA peak pressure: 31 mm Hg (S). Impressions:- Normal LV function; grade 1 diastolic dysfunction; mild MR and TR; trace AI; mildly elevated pulmonary pressures.  If no changes, I anticipate pt can proceed with surgery as scheduled.   Rica Mastngela Kabbe, FNP-BC [facility]/Anesthesiology Phone: [phone] 07/15/2014 5:04 PM

## 2014-07-21 MED ORDER — DEXAMETHASONE SODIUM PHOSPHATE 10 MG/ML IJ SOLN
10.0000 mg | INTRAMUSCULAR | Status: AC
  Administered 2014-07-22: 10 mg via INTRAVENOUS
  Filled 2014-07-21: qty 1

## 2014-07-21 MED ORDER — VANCOMYCIN HCL IN DEXTROSE 1-5 GM/200ML-% IV SOLN
1000.0000 mg | INTRAVENOUS | Status: AC
  Administered 2014-07-22: 1000 mg via INTRAVENOUS
  Filled 2014-07-21: qty 200

## 2014-07-22 ENCOUNTER — Encounter (HOSPITAL_COMMUNITY): Payer: Self-pay | Admitting: *Deleted

## 2014-07-22 ENCOUNTER — Inpatient Hospital Stay (HOSPITAL_COMMUNITY)
Admission: RE | Admit: 2014-07-22 | Discharge: 2014-07-25 | DRG: 460 | Disposition: A | Discharge status: Skilled Nursing Facility | Payer: Medicare Other | Source: Ambulatory Visit | Attending: Neurosurgery | Admitting: Neurosurgery

## 2014-07-22 ENCOUNTER — Inpatient Hospital Stay (HOSPITAL_COMMUNITY): Payer: Medicare Other

## 2014-07-22 ENCOUNTER — Inpatient Hospital Stay (HOSPITAL_COMMUNITY): Payer: Medicare Other | Admitting: Anesthesiology

## 2014-07-22 ENCOUNTER — Inpatient Hospital Stay (HOSPITAL_COMMUNITY): Payer: Medicare Other | Admitting: Emergency Medicine

## 2014-07-22 ENCOUNTER — Encounter (HOSPITAL_COMMUNITY)
Admission: RE | Disposition: A | Discharge status: Skilled Nursing Facility | Payer: Self-pay | Source: Ambulatory Visit | Attending: Neurosurgery

## 2014-07-22 DIAGNOSIS — Z79891 Long term (current) use of opiate analgesic: Secondary | ICD-10-CM | POA: Diagnosis not present

## 2014-07-22 DIAGNOSIS — N400 Enlarged prostate without lower urinary tract symptoms: Secondary | ICD-10-CM | POA: Diagnosis present

## 2014-07-22 DIAGNOSIS — M1990 Unspecified osteoarthritis, unspecified site: Secondary | ICD-10-CM | POA: Diagnosis present

## 2014-07-22 DIAGNOSIS — M4326 Fusion of spine, lumbar region: Secondary | ICD-10-CM

## 2014-07-22 DIAGNOSIS — M4316 Spondylolisthesis, lumbar region: Secondary | ICD-10-CM

## 2014-07-22 DIAGNOSIS — Z881 Allergy status to other antibiotic agents status: Secondary | ICD-10-CM

## 2014-07-22 DIAGNOSIS — Z79899 Other long term (current) drug therapy: Secondary | ICD-10-CM | POA: Diagnosis not present

## 2014-07-22 DIAGNOSIS — F329 Major depressive disorder, single episode, unspecified: Secondary | ICD-10-CM | POA: Diagnosis present

## 2014-07-22 DIAGNOSIS — M4806 Spinal stenosis, lumbar region: Secondary | ICD-10-CM | POA: Diagnosis present

## 2014-07-22 DIAGNOSIS — K219 Gastro-esophageal reflux disease without esophagitis: Secondary | ICD-10-CM | POA: Diagnosis present

## 2014-07-22 DIAGNOSIS — Z885 Allergy status to narcotic agent status: Secondary | ICD-10-CM | POA: Diagnosis not present

## 2014-07-22 DIAGNOSIS — F17210 Nicotine dependence, cigarettes, uncomplicated: Secondary | ICD-10-CM | POA: Diagnosis present

## 2014-07-22 DIAGNOSIS — M549 Dorsalgia, unspecified: Secondary | ICD-10-CM | POA: Diagnosis present

## 2014-07-22 HISTORY — PX: MAXIMUM ACCESS (MAS)POSTERIOR LUMBAR INTERBODY FUSION (PLIF) 1 LEVEL: SHX6368

## 2014-07-22 SURGERY — FOR MAXIMUM ACCESS (MAS) POSTERIOR LUMBAR INTERBODY FUSION (PLIF) 1 LEVEL
Anesthesia: General | Site: Back

## 2014-07-22 MED ORDER — VANCOMYCIN HCL IN DEXTROSE 1-5 GM/200ML-% IV SOLN
1000.0000 mg | Freq: Two times a day (BID) | INTRAVENOUS | Status: DC
  Administered 2014-07-22 – 2014-07-25 (×6): 1000 mg via INTRAVENOUS
  Filled 2014-07-22 (×7): qty 200

## 2014-07-22 MED ORDER — PROPOFOL 10 MG/ML IV BOLUS
INTRAVENOUS | Status: DC | PRN
  Administered 2014-07-22: 100 mg via INTRAVENOUS

## 2014-07-22 MED ORDER — 0.9 % SODIUM CHLORIDE (POUR BTL) OPTIME
TOPICAL | Status: DC | PRN
  Administered 2014-07-22: 1000 mL

## 2014-07-22 MED ORDER — SUGAMMADEX SODIUM 200 MG/2ML IV SOLN
INTRAVENOUS | Status: DC | PRN
  Administered 2014-07-22: 300 mg via INTRAVENOUS

## 2014-07-22 MED ORDER — VANCOMYCIN HCL 1000 MG IV SOLR
INTRAVENOUS | Status: DC | PRN
  Administered 2014-07-22: 1000 mg

## 2014-07-22 MED ORDER — LIDOCAINE HCL (CARDIAC) 20 MG/ML IV SOLN
INTRAVENOUS | Status: DC | PRN
  Administered 2014-07-22: 90 mg via INTRAVENOUS

## 2014-07-22 MED ORDER — PROPOFOL 10 MG/ML IV BOLUS
INTRAVENOUS | Status: AC
  Filled 2014-07-22: qty 20

## 2014-07-22 MED ORDER — SUGAMMADEX SODIUM 500 MG/5ML IV SOLN
4.0000 mg/kg | Freq: Once | INTRAVENOUS | Status: DC
  Filled 2014-07-22: qty 3.7

## 2014-07-22 MED ORDER — EPHEDRINE SULFATE 50 MG/ML IJ SOLN
INTRAMUSCULAR | Status: DC | PRN
  Administered 2014-07-22 (×4): 10 mg via INTRAVENOUS

## 2014-07-22 MED ORDER — PHENYLEPHRINE HCL 10 MG/ML IJ SOLN
INTRAMUSCULAR | Status: DC | PRN
  Administered 2014-07-22 (×9): 40 ug via INTRAVENOUS

## 2014-07-22 MED ORDER — MIDAZOLAM HCL 2 MG/2ML IJ SOLN
INTRAMUSCULAR | Status: AC
  Filled 2014-07-22: qty 2

## 2014-07-22 MED ORDER — HYDROMORPHONE HCL 1 MG/ML IJ SOLN: 0.5000 mg | INTRAMUSCULAR | Status: DC | PRN

## 2014-07-22 MED ORDER — ACETAMINOPHEN 325 MG PO TABS
650.0000 mg | ORAL_TABLET | ORAL | Status: DC | PRN
  Administered 2014-07-22: 650 mg via ORAL
  Filled 2014-07-22: qty 2

## 2014-07-22 MED ORDER — SODIUM CHLORIDE 0.9 % IJ SOLN
3.0000 mL | INTRAMUSCULAR | Status: DC | PRN
Start: 2014-07-22 — End: 2014-07-25

## 2014-07-22 MED ORDER — PHENOL 1.4 % MT LIQD
1.0000 | OROMUCOSAL | Status: DC | PRN
Start: 2014-07-22 — End: 2014-07-25

## 2014-07-22 MED ORDER — SODIUM CHLORIDE 0.9 % IV SOLN: 250.0000 mL | INTRAVENOUS | Status: DC

## 2014-07-22 MED ORDER — FENTANYL CITRATE 0.05 MG/ML IJ SOLN
INTRAMUSCULAR | Status: AC
  Filled 2014-07-22: qty 5

## 2014-07-22 MED ORDER — SODIUM CHLORIDE 0.9 % IR SOLN
Status: DC | PRN
  Administered 2014-07-22: 500 mL

## 2014-07-22 MED ORDER — LIDOCAINE-EPINEPHRINE 1 %-1:100000 IJ SOLN
INTRAMUSCULAR | Status: DC | PRN
  Administered 2014-07-22: 5 mL

## 2014-07-22 MED ORDER — ONDANSETRON HCL 4 MG/2ML IJ SOLN
4.0000 mg | INTRAMUSCULAR | Status: DC | PRN
Start: 2014-07-22 — End: 2014-07-25

## 2014-07-22 MED ORDER — FUROSEMIDE 40 MG PO TABS
40.0000 mg | ORAL_TABLET | Freq: Two times a day (BID) | ORAL | Status: DC
  Administered 2014-07-23 – 2014-07-25 (×3): 40 mg via ORAL
  Filled 2014-07-22 (×4): qty 1

## 2014-07-22 MED ORDER — NIACIN 500 MG PO TABS
500.0000 mg | ORAL_TABLET | Freq: Every day | ORAL | Status: DC
  Administered 2014-07-22 – 2014-07-24 (×3): 500 mg via ORAL
  Filled 2014-07-22 (×4): qty 1

## 2014-07-22 MED ORDER — VANCOMYCIN HCL 1000 MG IV SOLR
INTRAVENOUS | Status: AC
  Filled 2014-07-22: qty 1000

## 2014-07-22 MED ORDER — SERTRALINE HCL 50 MG PO TABS
25.0000 mg | ORAL_TABLET | Freq: Every day | ORAL | Status: DC
  Administered 2014-07-23 – 2014-07-25 (×3): 25 mg via ORAL
  Filled 2014-07-22 (×3): qty 1

## 2014-07-22 MED ORDER — BUPIVACAINE HCL (PF) 0.25 % IJ SOLN
INTRAMUSCULAR | Status: DC | PRN
  Administered 2014-07-22: 5 mL
  Administered 2014-07-22: 10 mL

## 2014-07-22 MED ORDER — FENTANYL CITRATE 0.05 MG/ML IJ SOLN
INTRAMUSCULAR | Status: DC | PRN
  Administered 2014-07-22 (×5): 50 ug via INTRAVENOUS

## 2014-07-22 MED ORDER — ACETAMINOPHEN 650 MG RE SUPP: 650.0000 mg | RECTAL | Status: DC | PRN

## 2014-07-22 MED ORDER — ONDANSETRON HCL 4 MG/2ML IJ SOLN
INTRAMUSCULAR | Status: DC | PRN
  Administered 2014-07-22: 4 mg via INTRAVENOUS

## 2014-07-22 MED ORDER — PANTOPRAZOLE SODIUM 40 MG PO TBEC
40.0000 mg | DELAYED_RELEASE_TABLET | Freq: Every day | ORAL | Status: DC
  Administered 2014-07-23 – 2014-07-24 (×2): 40 mg via ORAL
  Filled 2014-07-22 (×2): qty 1

## 2014-07-22 MED ORDER — MENTHOL 3 MG MT LOZG: 1.0000 | LOZENGE | OROMUCOSAL | Status: DC | PRN

## 2014-07-22 MED ORDER — LACTATED RINGERS IV SOLN
INTRAVENOUS | Status: DC | PRN
  Administered 2014-07-22 (×3): via INTRAVENOUS

## 2014-07-22 MED ORDER — TAMSULOSIN HCL 0.4 MG PO CAPS
0.8000 mg | ORAL_CAPSULE | Freq: Every day | ORAL | Status: DC
  Administered 2014-07-23 – 2014-07-25 (×3): 0.8 mg via ORAL
  Filled 2014-07-22 (×3): qty 2

## 2014-07-22 MED ORDER — HYDROMORPHONE HCL 1 MG/ML IJ SOLN: 0.2500 mg | INTRAMUSCULAR | Status: DC | PRN

## 2014-07-22 MED ORDER — MIDAZOLAM HCL 5 MG/5ML IJ SOLN
INTRAMUSCULAR | Status: DC | PRN
  Administered 2014-07-22 (×2): 1 mg via INTRAVENOUS

## 2014-07-22 MED ORDER — PROMETHAZINE HCL 25 MG/ML IJ SOLN: 6.2500 mg | INTRAMUSCULAR | Status: DC | PRN

## 2014-07-22 MED ORDER — PHENYLEPHRINE HCL 10 MG/ML IJ SOLN
10.0000 mg | INTRAMUSCULAR | Status: DC | PRN
  Administered 2014-07-22: 20 ug/min via INTRAVENOUS

## 2014-07-22 MED ORDER — THROMBIN 20000 UNITS EX SOLR
CUTANEOUS | Status: DC | PRN
  Administered 2014-07-22: 20 mL via TOPICAL

## 2014-07-22 MED ORDER — ROCURONIUM BROMIDE 100 MG/10ML IV SOLN
INTRAVENOUS | Status: DC | PRN
  Administered 2014-07-22 (×4): 10 mg via INTRAVENOUS
  Administered 2014-07-22: 40 mg via INTRAVENOUS

## 2014-07-22 MED ORDER — POTASSIUM CHLORIDE CRYS ER 10 MEQ PO TBCR
10.0000 meq | EXTENDED_RELEASE_TABLET | Freq: Every morning | ORAL | Status: DC
  Administered 2014-07-23 – 2014-07-25 (×3): 10 meq via ORAL
  Filled 2014-07-22 (×3): qty 1

## 2014-07-22 MED ORDER — OXYCODONE-ACETAMINOPHEN 5-325 MG PO TABS
1.0000 | ORAL_TABLET | ORAL | Status: DC | PRN
  Administered 2014-07-24 – 2014-07-25 (×2): 2 via ORAL
  Filled 2014-07-22 (×2): qty 2

## 2014-07-22 MED ORDER — OMEPRAZOLE MAGNESIUM 20 MG PO TBEC: 20.0000 mg | DELAYED_RELEASE_TABLET | Freq: Every day | ORAL | Status: DC

## 2014-07-22 MED ORDER — SODIUM CHLORIDE 0.9 % IJ SOLN
3.0000 mL | Freq: Two times a day (BID) | INTRAMUSCULAR | Status: DC
  Administered 2014-07-23 – 2014-07-24 (×2): 3 mL via INTRAVENOUS

## 2014-07-22 MED ORDER — ARTIFICIAL TEARS OP OINT
TOPICAL_OINTMENT | OPHTHALMIC | Status: DC | PRN
  Administered 2014-07-22: 1 via OPHTHALMIC

## 2014-07-22 MED ORDER — CYCLOBENZAPRINE HCL 10 MG PO TABS
10.0000 mg | ORAL_TABLET | Freq: Three times a day (TID) | ORAL | Status: DC | PRN
  Administered 2014-07-22 – 2014-07-25 (×3): 10 mg via ORAL
  Filled 2014-07-22 (×3): qty 1

## 2014-07-22 MED ORDER — HYDROMORPHONE HCL 2 MG PO TABS
4.0000 mg | ORAL_TABLET | ORAL | Status: DC | PRN
  Administered 2014-07-23 – 2014-07-25 (×9): 4 mg via ORAL
  Filled 2014-07-22 (×9): qty 2

## 2014-07-22 SURGICAL SUPPLY — 65 items
ALLOSTEM STRIP 20MMX50MM (Tissue) ×2 IMPLANT
APL SKNCLS STERI-STRIP NONHPOA (GAUZE/BANDAGES/DRESSINGS) ×1
BAG DECANTER FOR FLEXI CONT (MISCELLANEOUS) ×3 IMPLANT
BENZOIN TINCTURE PRP APPL 2/3 (GAUZE/BANDAGES/DRESSINGS) ×3 IMPLANT
BIT DRILL 5.0/4.0 (BIT) IMPLANT
BLADE CLIPPER SURG (BLADE) IMPLANT
BLADE SURG 11 STRL SS (BLADE) ×3 IMPLANT
BONE ALLOSTEM MORSELIZED 5CC (Bone Implant) ×2 IMPLANT
BUR MATCHSTICK NEURO 3.0 LAGG (BURR) ×3 IMPLANT
CAGE RISE 11-17-15 10X26 (Cage) ×4 IMPLANT
CANISTER SUCT 3000ML PPV (MISCELLANEOUS) ×3 IMPLANT
CAP LOCKING (Cap) ×8 IMPLANT
CAP LOCKING 5.5 CREO (Cap) IMPLANT
CLOSURE WOUND 1/2 X4 (GAUZE/BANDAGES/DRESSINGS) ×2
CONT SPEC 4OZ CLIKSEAL STRL BL (MISCELLANEOUS) ×6 IMPLANT
COVER BACK TABLE 24X17X13 BIG (DRAPES) IMPLANT
COVER BACK TABLE 60X90IN (DRAPES) ×3 IMPLANT
DRAPE C-ARM 42X72 X-RAY (DRAPES) ×3 IMPLANT
DRAPE C-ARMOR (DRAPES) ×3 IMPLANT
DRAPE LAPAROTOMY 100X72X124 (DRAPES) ×3 IMPLANT
DRAPE POUCH INSTRU U-SHP 10X18 (DRAPES) ×3 IMPLANT
DRAPE SURG 17X23 STRL (DRAPES) ×3 IMPLANT
DRILL 5.0/4.0 (BIT) ×3
DRSG OPSITE 4X5.5 SM (GAUZE/BANDAGES/DRESSINGS) ×4 IMPLANT
DRSG OPSITE POSTOP 4X6 (GAUZE/BANDAGES/DRESSINGS) ×2 IMPLANT
DRSG TELFA 3X8 NADH (GAUZE/BANDAGES/DRESSINGS) ×3 IMPLANT
DURAPREP 26ML APPLICATOR (WOUND CARE) ×3 IMPLANT
ELECT REM PT RETURN 9FT ADLT (ELECTROSURGICAL) ×3
ELECTRODE REM PT RTRN 9FT ADLT (ELECTROSURGICAL) ×1 IMPLANT
EVACUATOR 1/8 PVC DRAIN (DRAIN) ×3 IMPLANT
GAUZE SPONGE 4X4 16PLY XRAY LF (GAUZE/BANDAGES/DRESSINGS) IMPLANT
GLOVE BIO SURGEON STRL SZ8 (GLOVE) ×6 IMPLANT
GLOVE INDICATOR 8.5 STRL (GLOVE) ×5 IMPLANT
GOWN STRL REUS W/ TWL LRG LVL3 (GOWN DISPOSABLE) IMPLANT
GOWN STRL REUS W/ TWL XL LVL3 (GOWN DISPOSABLE) ×2 IMPLANT
GOWN STRL REUS W/TWL 2XL LVL3 (GOWN DISPOSABLE) IMPLANT
GOWN STRL REUS W/TWL LRG LVL3 (GOWN DISPOSABLE) ×3
GOWN STRL REUS W/TWL XL LVL3 (GOWN DISPOSABLE) ×9
HEMOSTAT POWDER KIT SURGIFOAM (HEMOSTASIS) IMPLANT
KIT BASIN OR (CUSTOM PROCEDURE TRAY) ×3 IMPLANT
KIT ROOM TURNOVER OR (KITS) ×3 IMPLANT
MILL MEDIUM DISP (BLADE) IMPLANT
NDL HYPO 25X1 1.5 SAFETY (NEEDLE) ×1 IMPLANT
NEEDLE HYPO 25X1 1.5 SAFETY (NEEDLE) ×3 IMPLANT
NS IRRIG 1000ML POUR BTL (IV SOLUTION) ×3 IMPLANT
PACK LAMINECTOMY NEURO (CUSTOM PROCEDURE TRAY) ×3 IMPLANT
PAD ARMBOARD 7.5X6 YLW CONV (MISCELLANEOUS) ×9 IMPLANT
PAD DRESSING TELFA 3X8 NADH (GAUZE/BANDAGES/DRESSINGS) ×1 IMPLANT
ROD SPINAL 35MM (Rod) ×4 IMPLANT
SCREW MOD 6.0-5.0X35MM (Screw) ×6 IMPLANT
SCREW PREASSEMBLY 6.0-5.0X35 (Screw) ×4 IMPLANT
SPONGE LAP 4X18 X RAY DECT (DISPOSABLE) IMPLANT
SPONGE SURGIFOAM ABS GEL 100 (HEMOSTASIS) ×3 IMPLANT
STRIP CLOSURE SKIN 1/2X4 (GAUZE/BANDAGES/DRESSINGS) ×4 IMPLANT
SUT VIC AB 0 CT1 18XCR BRD8 (SUTURE) ×1 IMPLANT
SUT VIC AB 0 CT1 8-18 (SUTURE) ×3
SUT VIC AB 2-0 CT1 18 (SUTURE) ×3 IMPLANT
SUT VICRYL 4-0 PS2 18IN ABS (SUTURE) ×3 IMPLANT
SYR 20ML ECCENTRIC (SYRINGE) ×3 IMPLANT
TAPE STRIPS DRAPE STRL (GAUZE/BANDAGES/DRESSINGS) ×3 IMPLANT
TOWEL OR 17X24 6PK STRL BLUE (TOWEL DISPOSABLE) ×3 IMPLANT
TOWEL OR 17X26 10 PK STRL BLUE (TOWEL DISPOSABLE) ×3 IMPLANT
TRAY FOLEY CATH 14FRSI W/METER (CATHETERS) ×3 IMPLANT
TULIP CREP AMP 5.5MM (Orthopedic Implant) ×4 IMPLANT
WATER STERILE IRR 1000ML POUR (IV SOLUTION) ×3 IMPLANT

## 2014-07-22 NOTE — Transfer of Care (Signed)
Immediate Anesthesia Transfer of Care Note  Patient: Edwar A Moor  Procedure(s) Performed: Procedure(s) with comments: FOR MAXIMUM ACCESS (MAS) POSTERIOR LUMBAR INTERBODY FUSION (PLIF) 1 LEVEL (N/A) - FOR MAXIMUM ACCESS (MAS) POSTERIOR LUMBAR INTERBODY FUSION (PLIF) 1 LEVEL L4-5  Patient Location: PACU  Anesthesia Type:General  Level of Consciousness: awake and alert   Airway & Oxygen Therapy: Patient Spontanous Breathing and Patient connected to nasal cannula oxygen  Post-op Assessment: Report given to RN and Post -op Vital signs reviewed and stable  Post vital signs: Reviewed and stable  Last Vitals:  Filed Vitals:   07/22/14 0620  BP: 109/55  Pulse: 102  Temp: 36.1 C  Resp: 20    Complications: No apparent anesthesia complications

## 2014-07-22 NOTE — Progress Notes (Signed)
Patient arrived to room from PACU. Safety precautions and order reviewed with patient. Call light and possessions within reach. Family at bedside. Will continue to monitor.   Sim BoastHavy, RN

## 2014-07-22 NOTE — H&P (Signed)
Evan [name] is an 74 y.o. male.   Chief Complaint: Back and leg pain HPI: Patient is a very pleasant 75 year old some is a long-standing back pain however last weeks and months is good progressively worse and he said increased difficulty with ambulation and proximal leg weakness. 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 Due to patient's failure conservative treatment progression of clinical syndrome and imaging findings have recommended decompression stabilization procedure at L4-5. I extensively reviewed the risks and benefits of the operation with the patient as well as perioperative course expectations of outcome and alternatives of surgery and he understood and agreed to proceed forward.  Past Medical History  Diagnosis Date  . BPH (benign prostatic hyperplasia)     takes Flomax daily  . Arthritis     spine  . Gout, arthritis     feet  . Hard of hearing     does not wear hearing aides  . Depression     takes Zoloft daily  . GERD (gastroesophageal reflux disease)     takes Omeprazole daily  . Hyperlipidemia     takes Niacin daily  . Peripheral edema     takes lasix daily  . Shortness of breath     with exertion  . Pneumonia 2015  . History of bronchitis     "long time ago"  . Migraine     r/t neck issues  . Loss of balance     related to back   . Numbness     hands and related to neck  . Joint pain   . Chronic back pain     stenosis  . Gout     takes Allouprinol and Colchicine daily  . Diverticulosis   . Cataract     immature left eye    Past Surgical History  Procedure Laterality Date  . Neck surgery  2009    Cervical Fusion x3  . Ulnar nerve repair Left 2010  . Tonsillectomy    . Cholecystectomy  10+ years ago  . Cardiovascular stress test  18 YEARS ago  . Cervical fusion      x 5  . Back surgery    . Lumbar fusion  2011  . Hernia repair    . Posterior cervical  fusion/foraminotomy  06/09/2011    Procedure: POSTERIOR CERVICAL FUSION/FORAMINOTOMY LEVEL 4;  Surgeon: Mariam Dollar, MD;  Location: [facility];  Service: Neurosurgery;  Laterality: N/A;  Cervical three to seven  posterior cervical fusion, Cervical four to seven Laminectomy, bone graft from right iliac crest   . Lumbar laminectomy/decompression microdiscectomy  10/29/2011    Procedure: LUMBAR LAMINECTOMY/DECOMPRESSION MICRODISCECTOMY 1 LEVEL;  Surgeon: Mariam Dollar, MD;  Location: [facility];  Service: Neurosurgery;  Laterality: Right;  Right Lumbar Three-Four Lumbar Laminectomy/Microdiscectomy  . Esophagogastroduodenoscopy    . Colonoscopy      History reviewed. No pertinent family history. Social History:  reports that he has been smoking Cigarettes.  He has a 82.5 pack-year smoking history. He uses smokeless tobacco. He reports that he does not drink alcohol or use illicit drugs.  Allergies:  Allergies  Allergen Reactions  . Cephalosporins Other (See Comments)    Oral thrush  . Oxycodone Itching and Nausea Only    Medications Prior to Admission  Medication Sig Dispense Refill  . febuxostat (ULORIC) 40 MG tablet Take 40 mg by mouth as needed. Takes only as  needed    . furosemide (LASIX) 40 MG tablet Take 40 mg by mouth 2 (two) times daily.    Marland Kitchen. HYDROmorphone (DILAUDID) 4 MG tablet Take 4 mg by mouth every 4 (four) hours as needed for severe pain.    . Multiple Vitamins-Minerals (CENTRUM SILVER ADULT 50+ PO) Take 1 tablet by mouth daily.    . niacin 500 MG tablet Take 500 mg by mouth at bedtime.    . Omega-3 Fatty Acids (FISH OIL) 1000 MG CAPS Take 1,000 mg by mouth daily.    Marland Kitchen. omeprazole (PRILOSEC OTC) 20 MG tablet Take 20 mg by mouth daily.    Bertram Gala. Polyethyl Glycol-Propyl Glycol (SYSTANE OP) Place 2 drops into both eyes at bedtime.    . potassium chloride (MICRO-K) 10 MEQ CR capsule Take 10 mEq by mouth daily.    . Probiotic Product (PROBIOTIC PO) Take 1 capsule by mouth daily.    .  sertraline (ZOLOFT) 25 MG tablet Take 25 mg by mouth daily.    . tamsulosin (FLOMAX) 0.4 MG CAPS capsule Take 0.8 mg by mouth daily.       No results found for this or any previous visit (from the past 48 hour(s)). No results found.  Review of Systems  Constitutional: Negative.   HENT: Negative.   Eyes: Negative.   Respiratory: Negative.   Cardiovascular: Negative.   Genitourinary: Negative.   Musculoskeletal: Negative.   Skin: Negative.   Neurological: Positive for tingling and sensory change.  Psychiatric/Behavioral: Negative.     Blood pressure 109/55, pulse 102, temperature 97 F (36.1 C), temperature source Oral, resp. rate 20, height 5' 5.5" (1.664 m), weight 93.441 kg (206 lb), SpO2 99 %. Physical Exam  Constitutional: He is oriented to person, place, and time. He appears well-developed and well-nourished.  HENT:  Head: Normocephalic.  Eyes: Pupils are equal, round, and reactive to light.  Neck: Normal range of motion.  Respiratory: Effort normal.  GI: Soft.  Neurological: He is alert and oriented to person, place, and time. GCS eye subscore is 4. GCS verbal subscore is 5. GCS motor subscore is 6.  Reflex Scores:      Patellar reflexes are 0 on the right side and 0 on the left side.      Achilles reflexes are 0 on the right side and 0 on the left side. Strength is 4+ out of 5 in his iliopsoas, quads, hamstrings, gastric, and a tibialis, and EHL.  Skin: Skin is warm and dry.     Assessment/Plan 74 year old gentleman with L4-5 spinal listhesis presents for decompression stabilization procedure.  Vara Mairena P 07/22/2014, 7:16 AM

## 2014-07-22 NOTE — Clinical Social Work Note (Signed)
CSW Consult Acknowledged:   CSW received a consult for SNF placement. CSW awaiting PT/OT evaluation to determine the appropriate level of care.      Tal Neer, MSW, LCSWA [phone]  

## 2014-07-22 NOTE — Anesthesia Preprocedure Evaluation (Addendum)
Anesthesia Evaluation  Patient identified by MRN, date of birth, ID band Patient awake    Reviewed: Allergy & Precautions, NPO status , Patient's Chart, lab work & pertinent test results  Airway Mallampati: II  TM Distance: >3 FB Neck ROM: Full    Dental no notable dental hx. (+) Dental Advisory Given   Pulmonary COPDCurrent Smoker,  Smoking Status: Current Some Day Smoker - 82.5 pack years breath sounds clear to auscultation  + decreased breath sounds      Cardiovascular negative cardio ROS  Rhythm:Regular Rate:Normal     Neuro/Psych negative neurological ROS  negative psych ROS   GI/Hepatic Neg liver ROS, GERD-  Medicated,  Endo/Other  negative endocrine ROS  Renal/GU negative Renal ROS  negative genitourinary   Musculoskeletal negative musculoskeletal ROS (+)   Abdominal   Peds negative pediatric ROS (+)  Hematology negative hematology ROS (+)   Anesthesia Other Findings   Reproductive/Obstetrics negative OB ROS                            Anesthesia Physical Anesthesia Plan  ASA: III  Anesthesia Plan: General   Post-op Pain Management:    Induction: Intravenous  Airway Management Planned: Oral ETT  Additional Equipment:   Intra-op Plan:   Post-operative Plan: Extubation in OR  Informed Consent: I have reviewed the patients History and Physical, chart, labs and discussed the procedure including the risks, benefits and alternatives for the proposed anesthesia with the patient or authorized representative who has indicated his/her understanding and acceptance.   Dental advisory given  Plan Discussed with: CRNA and Surgeon  Anesthesia Plan Comments:         Anesthesia Quick Evaluation

## 2014-07-22 NOTE — Progress Notes (Signed)
Utilization review completed.  

## 2014-07-22 NOTE — Addendum Note (Signed)
Addendum  created 07/22/14 1226 by Edmonia CaprioAmanda M Saraih Lorton, CRNA   Modules edited: Anesthesia Medication Administration

## 2014-07-22 NOTE — Progress Notes (Signed)
ANTIBIOTIC CONSULT NOTE - INITIAL  Pharmacy Consult for Vancomycin Indication: Post-op coverage while drain in place  Allergies  Allergen Reactions  . Cephalosporins Other (See Comments)    Oral thrush  . Oxycodone Itching and Nausea Only    Patient Measurements: Height: 5' 5.5" (166.4 cm) Weight: 206 lb (93.441 kg) IBW/kg (Calculated) : 62.65  Vital Signs: Temp: 97.5 F (36.4 C) (04/04 1511) Temp Source: Oral (04/04 1511) BP: 90/56 mmHg (04/04 1511) Pulse Rate: 42 (04/04 1511) Intake/Output from previous day:   Intake/Output from this shift: Total I/O In: 2000 [I.V.:2000] Out: 2600 [Urine:2000; Blood:600]  Labs: No results for input(s): WBC, HGB, PLT, LABCREA, CREATININE in the last 72 hours. Estimated Creatinine Clearance: 87.2 mL/min (by C-G formula based on Cr of 0.77). No results for input(s): VANCOTROUGH, VANCOPEAK, VANCORANDOM, GENTTROUGH, GENTPEAK, GENTRANDOM, TOBRATROUGH, TOBRAPEAK, TOBRARND, AMIKACINPEAK, AMIKACINTROU, AMIKACIN in the last 72 hours.   Microbiology: Recent Results (from the past 720 hour(s))  Surgical pcr screen     Status: None   Collection Time: 07/15/14 11:12 AM  Result Value Ref Range Status   MRSA, PCR NEGATIVE NEGATIVE Final   Staphylococcus aureus NEGATIVE NEGATIVE Final    Comment:        The Xpert SA Assay (FDA approved for NASAL specimens in patients over 74 years of age), is one component of a comprehensive surveillance program.  Test performance has been validated by [facility] for patients greater than or equal to 74 year old. It is not intended to diagnose infection nor to guide or monitor treatment.     Medical History: Past Medical History  Diagnosis Date  . BPH (benign prostatic hyperplasia)     takes Flomax daily  . Arthritis     spine  . Gout, arthritis     feet  . Hard of hearing     does not wear hearing aides  . Depression     takes Zoloft daily  . GERD (gastroesophageal reflux disease)     takes  Omeprazole daily  . Hyperlipidemia     takes Niacin daily  . Peripheral edema     takes lasix daily  . Shortness of breath     with exertion  . Pneumonia 2015  . History of bronchitis     "long time ago"  . Migraine     r/t neck issues  . Loss of balance     related to back   . Numbness     hands and related to neck  . Joint pain   . Chronic back pain     stenosis  . Gout     takes Allouprinol and Colchicine daily  . Diverticulosis   . Cataract     immature left eye    Assessment: 74 [name] s/p maximum access (MAS) posterior lumbar interbody fusion (PLIF) 1 level on 4/4 to continue Vancomycin for post-op coverage while the drain remains in place. Afebrile, no post-op CBC, SCr from pre-op labs on 3/28 was 0.77, CrCl~60-70 ml/min. Pre-op Vancomycin dose given at 0720.   Goal of Therapy:  Vancomycin trough level 10-15 mcg/ml  Plan:  1. Vancomycin 1g IV every 12 hours 2. Will obtain SCr in the AM to evaluate renal function 3. Will continue to follow renal function, culture results, LOT, and antibiotic de-escalation plans   Evan PillionElizabeth Ardelia [name], PharmD, BCPS Clinical Pharmacist Pager: [phone] 07/22/2014 4:08 PM

## 2014-07-22 NOTE — Op Note (Signed)
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  Postoperative diagnosis: Same  Procedure: Decompressive lumbar laminectomy complete medial facetectomies radical foraminotomies at L4-5 and excess and requiring more work to would be needed with a standard interbody fusion  #2 posterior lumbar interbody fusion utilizing the globus arise expandable cage system packed with locally harvested autograft mixed with allostem morsels  #3 cortical screw fixation L4-5 using the globus MAS cortical screw set 5.5 creo modular  Surgeon: Jillyn HiddenGary Tyrell Brereton  Asst.: Sherilyn CooterHenry pool  Anesthesia: Gen.  EBL: Minimal  History of present illness: Patient is a very pleasant 74 year old some is a long same back and bilateral leg pain rating down the L4 and L5 nerve root pattern. Workup revealed progressive and severe spinal stenosis at L4-5 with severe compression of the L4 and L5 nerve roots. Due to patient's failure conservative treatment imaging findings and progressive clinical syndrome I recommended decompression stabilization procedure at L4-5. I extensively went over the risks and benefits of the operation with the patient as well as perioperative course expectations of outcome and alternatives of surgery and he understands and agrees to proceed 4.  Operative procedure: Patient brought into the or was induced under general anesthesia positioned prone on flat rolls his back was prepped and draped in routine sterile fashion is old incision was opened up and extended caudally dissection and subperiosteal dissections care on the lamina of L4-5. Interoperative x-ray confirmed identification appropriate level then using, he should've AP and lateral fluoroscopy pilot holes were drilled at the 5 and 7:00 position the L4 pedicle cannulated probed drilled probed and tapped and [id number] 35 mm screws were inserted at L4. All screws excellent purchase then testing  decompression spinous process of L4 smooth central decompression was begun there was marked facet arthropathy causing severe hourglass compression of thecal sac was densely adherent to a. Scar was dissected free and the facet joints were dissected off the dura and removed in piecemeal fashion complete medial facetectomies were performed and aggressive abutting the superior tickling facet was carried out bilaterally decompressing the L4 and L5 nerve roots. Then disc spaces were identified epidural veins Crigler disc space was incised bilaterally using sequential distraction with an 11 distractor ultimately in place disc space from the contralateral side was cleaned out with Scovel's Epstein curettes and paddle shavers. After adequate endplate preparation been achieved 1017 expandable 15 lordosis 26 mm length cage was packed with the autograft mixed and inserted and expanded approximate 6 turns. Then the distractors removed the other disc space was prepped in a similar fashion large central disc was removed local autograft mixed was packed centrally condylar cage was inserted and expanded a similar fashion. Then attention second pedicle screw placement at L5 using cortical screw sites again directing medial a slightly lateral the holes were drilled probed tapped to probed and 6050 by 35 mm screws were inserted L5 bilaterally then the heads were assembled posterior fluoroscopy confirmed good position of all the screws and implants then the foraminal reinspected confirm patency Gelfoam was laid top of the dura by Su Hiltoberts placed top tightness are applied large Hemovac drain was placed and the wounds closed in layers with interrupted Vicryl and skin was closed running 4 subcuticular benzoin and Steri-Strips were applied a short recovered in stable condition. At the end of case all needle counts sponge counts were correct.

## 2014-07-22 NOTE — Anesthesia Postprocedure Evaluation (Signed)
  Anesthesia Post-op Note  Patient: Evan [name]  Procedure(s) Performed: Procedure(s) (LRB): FOR MAXIMUM ACCESS (MAS) POSTERIOR LUMBAR INTERBODY FUSION (PLIF) 1 LEVEL (N/A)  Patient Location: PACU  Anesthesia Type: General  Level of Consciousness: awake and alert   Airway and Oxygen Therapy: Patient Spontanous Breathing  Post-op Pain: mild  Post-op Assessment: Post-op Vital signs reviewed, Patient's Cardiovascular Status Stable, Respiratory Function Stable, Patent Airway and No signs of Nausea or vomiting  Last Vitals:  Filed Vitals:   07/22/14 1135  BP: 104/57  Pulse: 80  Temp:   Resp: 16    Post-op Vital Signs: stable   Complications: No apparent anesthesia complications

## 2014-07-22 NOTE — Anesthesia Procedure Notes (Signed)
Procedure Name: Intubation Date/Time: 07/22/2014 7:33 AM Performed by: Evan CaprioAUSTON, Evan Doten M Pre-anesthesia Checklist: Patient identified, Emergency Drugs available, Timeout performed, Suction available and Patient being monitored Patient Re-evaluated:Patient Re-evaluated prior to inductionOxygen Delivery Method: Circle system utilized Preoxygenation: Pre-oxygenation with 100% oxygen Intubation Type: IV induction Ventilation: Mask ventilation without difficulty Laryngoscope Size: Miller and 2 Grade View: Grade I Tube type: Oral Tube size: 7.5 mm Number of attempts: 1 Airway Equipment and Method: Stylet Placement Confirmation: ETT inserted through vocal cords under direct vision,  positive ETCO2 and breath sounds checked- equal and bilateral Secured at: 23 cm Tube secured with: Tape Dental Injury: Teeth and Oropharynx as per pre-operative assessment

## 2014-07-23 ENCOUNTER — Encounter (HOSPITAL_COMMUNITY): Payer: Self-pay | Admitting: General Practice

## 2014-07-23 LAB — CREATININE, SERUM
Creatinine, Ser: 0.95 mg/dL (ref 0.50–1.35)
GFR calc non Af Amer: 81 mL/min — ABNORMAL LOW (ref >90)

## 2014-07-23 NOTE — Progress Notes (Signed)
Rehab Admissions Coordinator Note:  Patient was screened by Clois DupesBoyette, Jaleen Finch Godwin for appropriateness for an Inpatient Acute Rehab Consult per OT recommendation.  At this time, we are recommending Skilled Nursing Facility.[employer] medicare will not approve an inpt rehab admission for this diagnosis.   Clois DupesBoyette, Elijah Phommachanh Godwin 07/23/2014, 3:44 PM  I can be reached at [phone].

## 2014-07-23 NOTE — Evaluation (Signed)
Occupational Therapy Evaluation Patient Details Name: Evan [name] MRN: [id number] DOB: 04/18/1941 Today's Date: 07/23/2014    History of Present Illness Patient is a 74 year old with long-standing back pain that over the past few weeks and months has gotten progressively worse with increased difficulty with ambulation and proximal leg weakness.  Prior to surgery, pt was only ambulating with RW once every 2-3 days and primarily used motorized WC.  Pt is s/p posterior interlumbar fusion L4-L5 on 07/22/2014.    Clinical Impression   Patient overall min assist PTA. Also PTA, patient states he was using a w/c for mobility secondary to BLE weakness. Patient currently requires up to total assist for LB ADLs and mod assist for sit<>stands and functional mobility. Patient will benefit from acute OT to increase overall independence in the areas of ADLs, functional mobility, and overall safety in order to safely discharge to venue listed below.     Follow Up Recommendations  CIR;Supervision/Assistance - 24 hour    Equipment Recommendations  Other (comment) (TBD nex venue)    Recommendations for Other Services  None at this time   Precautions / Restrictions Precautions Precautions: Back;Fall Precaution Comments: Reviewed back precautions Required Braces or Orthoses: Spinal Brace Spinal Brace: Lumbar corset;Applied in sitting position Restrictions Weight Bearing Restrictions: No      Mobility Bed Mobility Overal bed mobility: Needs Assistance Bed Mobility: Rolling;Sidelying to Sit Rolling: Mod assist Sidelying to sit: Min assist       General bed mobility comments: Patient with good memory regarding the log roll from earlier session. Patient requird asistance with BLEs and trunk control during bed mobility.   Transfers Overall transfer level: Needs assistance Equipment used: Rolling walker (2 wheeled) Transfers: Sit to/from [employer] Sit to Stand: Mod  assist Stand pivot transfers: Mod assist     General transfer comment: Cues required for safety and hand placement.     Balance Overall balance assessment: Needs assistance Sitting-balance support: No upper extremity supported;Feet supported Sitting balance-Leahy Scale: Fair     Standing balance support: Bilateral upper extremity supported;During functional activity Standing balance-Leahy Scale: Poor    ADL Overall ADL's : Needs assistance/impaired Eating/Feeding: Set up;Sitting   Grooming: Set up;Sitting   Upper Body Bathing: Sitting;Min guard   Lower Body Bathing: Total assistance;Sit to/from stand;Cueing for safety   Upper Body Dressing : Min guard;Sitting   Lower Body Dressing: Sit to/from stand;Total assistance;Cueing for safety   Toilet Transfer: Minimal assistance;BSC;RW;Ambulation   Toileting- Clothing Manipulation and Hygiene: Total assistance;Sit to/from stand;Cueing for safety       Functional mobility during ADLs: Minimal assistance;Rolling walker General ADL Comments: Patient presents with deconditioning, stating he hasn't walked in months due to ongoing weakness. Patient unable to bring BLEs to self for LB ADLs. Handout given and education provided regarding AE (reacher, sock aid, LH sponge, LH shoe horn). Wife present and able to assist at home. Recommending CIR.     Pertinent Vitals/Pain Pain Score: 3  Pain Location: back Pain Descriptors / Indicators: Aching Pain Intervention(s): Monitored during session     Hand Dominance Right   Extremity/Trunk Assessment Upper Extremity Assessment Upper Extremity Assessment: Overall WFL for tasks assessed   Lower Extremity Assessment Lower Extremity Assessment: Defer to PT evaluation   Cervical / Trunk Assessment Cervical / Trunk Assessment: Normal   Communication Communication Communication: No difficulties   Cognition Arousal/Alertness: Awake/alert Behavior During Therapy: WFL for tasks  assessed/performed Overall Cognitive Status: Within Functional Limits for tasks assessed  Home Living Family/patient expects to be discharged to:: Private residence Living Arrangements: Spouse/significant other Available Help at Discharge: Family;Available 24 hours/day Type of Home: House Home Access: Ramped entrance     Home Layout: One level     Bathroom Shower/Tub: Walk-in shower;Door   Foot LockerBathroom Toilet: Handicapped height     Home Equipment: Wheelchair - power;Walker - 2 wheels;Shower seat   Additional Comments: Previously used mostly used power WC for ambulation      Prior Functioning/Environment Level of Independence: Needs assistance    ADL's / Homemaking Assistance Needed: min assist until about a week ago when he was dependent with ADLs        OT Diagnosis: Generalized weakness;Acute pain   OT Problem List: Decreased strength;Decreased range of motion;Decreased activity tolerance;Impaired balance (sitting and/or standing);Decreased safety awareness;Decreased knowledge of use of DME or AE;Decreased knowledge of precautions;Pain   OT Treatment/Interventions: Self-care/ADL training;Energy conservation;DME and/or AE instruction;Therapeutic activities;Patient/family education;Balance training    OT Goals(Current goals can be found in the care plan section) Acute Rehab OT Goals Patient Stated Goal: Get stronger OT Goal Formulation: With patient/family Time For Goal Achievement: 07/30/14 Potential to Achieve Goals: Good ADL Goals Pt Will Perform Grooming: with modified independence;sitting Pt Will Perform Upper Body Bathing: with set-up;sitting Pt Will Perform Lower Body Bathing: with min assist;sit to/from stand;with adaptive equipment Pt Will Perform Upper Body Dressing: with set-up;sitting Pt Will Perform Lower Body Dressing: with min assist;sit to/from stand Pt Will Transfer to Toilet: with supervision;bedside commode;ambulating Pt Will Perform  Toileting - Clothing Manipulation and hygiene: with mod assist;sit to/from stand;with adaptive equipment Additional ADL Goal #1: Patient will independently verbalize and adhere to 3/3 back precautions 100% of the time  OT Frequency: Min 2X/week   Barriers to D/C: None known at this time          End of Session Equipment Utilized During Treatment: Rolling walker;Back brace  Activity Tolerance: Patient tolerated treatment well Patient left: in chair;with call bell/phone within reach;with family/visitor present   Time: [id number] OT Time Calculation (min): 27 min Charges:  OT General Charges $OT Visit: 1 Procedure OT Evaluation $Initial OT Evaluation Tier I: 1 Procedure OT Treatments $Self Care/Home Management : 8-22 mins  Ariona Deschene , MS, OTR/L, CLT Pager: [phone]  07/23/2014, 2:08 PM

## 2014-07-23 NOTE — Clinical Social Work Psychosocial (Signed)
Clinical Social Work Department BRIEF PSYCHOSOCIAL ASSESSMENT 07/23/2014  Patient:  Evan [name], Evan [name]     Account Number:  [id number]     Admit date:  07/22/2014  Clinical Social Worker:  Marciano Sequin  Date/Time:  07/23/2014 01:41 PM  Referred by:  RN  Date Referred:  07/23/2014 Referred for  SNF Placement   Other Referral:   Interview type:  Patient Other interview type:    PSYCHOSOCIAL DATA Living Status:  WIFE Admitted from facility:   Level of care:   Primary support name:  Evan [name],Evan [name] Primary support relationship to patient:  SPOUSE Degree of support available:   Strong Support    CURRENT CONCERNS Current Concerns  None Noted   Other Concerns:    SOCIAL WORK ASSESSMENT / PLAN CSW met the pt at the bedside.  CSW introduced self and purpose of the visit. CSW discussed clinical recommendation for SNF rehab. CSW inquired about the geographical location in which the pt would like to receive rehab from. Pt reported being unsure if he would like to go to SNF. Pt reported if he has to go to SNF he would prefer [facility]. CSW explained the SNF rehab process to the pt. CSW and pt discussed insurance and its relation to SNF rehab. CSW answered all questions in which the pt inquired about. CSW provided pt with contact information for further questions. CSW will continue to follow this pt and assist with discharge as needed.   Assessment/plan status:  Psychosocial Support/Ongoing Assessment of Needs Other assessment/ plan:   Information/referral to community resources:    PATIENT'S/FAMILY'S RESPONSE TO PLAN OF CARE: Pt presented with a happy affect and pleasant mood. Pt provided great insight into his current health condition. Pt reported feeling well with some back pain. Pt appeared to be eager to return home.   PATIENT'S/FAMILY'S RESPONSE TO PLAN OF CARE: Pt reported that he will talk to his wife regarding SNF placement for rehab.   Bee, MSW,  [place]

## 2014-07-23 NOTE — Progress Notes (Signed)
Patient has foley catheter in place, informed that it will be taken out this morning per surgical protocol. 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

## 2014-07-23 NOTE — Progress Notes (Signed)
Subjective: Patient reports Doing much better no leg pain just back pain  Objective: Vital signs in last 24 hours: Temp:  [97.5 F (36.4 C)-98.6 F (37 C)] 98.1 F (36.7 C) (04/05 0148) Pulse Rate:  [42-97] 62 (04/05 0531) Resp:  [11-22] 20 (04/05 0148) BP: (90-119)/(47-94) 100/58 mmHg (04/05 0531) SpO2:  [91 %-99 %] 95 % (04/05 0148)  Intake/Output from previous day: 04/04 0701 - 04/05 0700 In: 2480 [[name].O.:480; I.V.:2000] Out: 4300 [Urine:3550; Drains:150; Blood:600] Intake/Output this shift:    Strength 5 out of 5 wound clean dry and intact  Lab Results: No results for input(s): WBC, HGB, HCT, PLT in the last 72 hours. BMET No results for input(s): NA, K, CL, CO2, GLUCOSE, BUN, CREATININE, CALCIUM in the last 72 hours.  Studies/Results: Dg Lumbar Spine 2-3 Views  07/22/2014   CLINICAL DATA:  Posterior fusion.  EXAM: DG C-ARM 61-120 MIN; LUMBAR SPINE - 2-3 VIEW  FLUOROSCOPY TIME:  Fluoroscopy Time (in minutes and seconds): 44 seconds  Number of Acquired Images:  2  COMPARISON:  MRI 05/23/2014.  FINDINGS: Pedicle screws and interbody fusion device noted L4-L5. Good anatomic alignment.  IMPRESSION: Postsurgical changes L4-L5.  Good anatomic alignment .   Electronically Signed   By: Maisie Fushomas  Register   On: 07/22/2014 10:55   Dg C-arm 61-120 Min  07/22/2014   CLINICAL DATA:  Posterior fusion.  EXAM: DG C-ARM 61-120 MIN; LUMBAR SPINE - 2-3 VIEW  FLUOROSCOPY TIME:  Fluoroscopy Time (in minutes and seconds): 44 seconds  Number of Acquired Images:  2  COMPARISON:  MRI 05/23/2014.  FINDINGS: Pedicle screws and interbody fusion device noted L4-L5. Good anatomic alignment.  IMPRESSION: Postsurgical changes L4-L5.  Good anatomic alignment .   Electronically Signed   By: Maisie Fushomas  Register   On: 07/22/2014 10:55    Assessment/Plan: Mobilized day with physical and occupational therapy  LOS: 1 day     Evan [name] 07/23/2014, 7:30 AM

## 2014-07-23 NOTE — Clinical Social Work Placement (Addendum)
Clinical Social Work Department CLINICAL SOCIAL WORK PLACEMENT NOTE 07/23/2014  Patient:  [name],Evan A  Account Number:  [id number] Admit date:  07/22/2014  Clinical Social Worker:  Ashok CordiaYSHEKA Kerry Odonohue, LCSWA  Date/time:  07/23/2014 01:54 PM  Clinical Social Work is seeking post-discharge placement for this patient at the following level of care:   SKILLED NURSING   (*CSW will update this form in Epic as items are completed)   07/23/2014  [facility]/family provided with [facility] Work's list of facilities offering this level of care within the geographic area requested by the patient (or if unable, by the patient's family).  07/23/2014  Patient/family informed of their freedom to choose among providers that offer the needed level of care, that participate in Medicare, Medicaid or managed care program needed by the patient, have an available bed and are willing to accept the patient.  07/23/2014  Patient/family informed of [facility]' ownership interest in [facility], as well as of the fact that they are under no obligation to receive care at this facility.  PASARR submitted to EDS on 07/23/2014 PASARR number received on 07/23/2014  FL2 transmitted to all facilities in geographic area requested by pt/family on  07/23/2014 FL2 transmitted to all facilities within larger geographic area on 07/23/2014  Patient informed that his/her managed care company has contracts with or will negotiate with  certain facilities, including the following:     Patient/family informed of bed offers received:  07/24/2014 Patient chooses bed at [facility]  Physician recommends and patient chooses bed at    Patient to be transferred to [facility] on 07/25/2014 Patient to be transferred to facility by [facility]  Patient and family notified of transfer on 07/25/2014  Name of family member notified: Pt reported that he will notify his wife    The following  physician request were entered in Epic:   Additional Comments:  [name], MSW, LCSWA [phone]

## 2014-07-23 NOTE — Evaluation (Addendum)
Physical Therapy Evaluation Patient Details Name: Evan [name] MRN: [id number] DOB: 07/17/1940 Today's Date: 07/23/2014   History of Present Illness  Patient is a 74 year old with long-standing back pain that over the past few weeks and months has gotten progressively worse with increased difficulty with ambulation and proximal leg weakness.  Prior to surgery, pt was only ambulating with RW once every 2-3 days and primarily used motorized WC.  Pt is s/p posterior interlumbar fusion L4-L5 on 07/22/2014.   Clinical Impression  Pt is s/p posterior interlumbar fusion (L4-L5) resulting in the deficits listed below (see PT Problem List). Prior to surgery, pt relied on power WC for ambulation, and was only ambulating with RW 2-3x/week.  Pt demonstrated ability to ambulate with RW with moderate assist and chair follow today.  Recommend short term SNF to improve functional gains in order to return home with more independence and at a min guard level of function. Pt will benefit from skilled PT to improve mobility and for pt/caregiver education prior to d/c.     Follow Up Recommendations SNF    Equipment Recommendations  None recommended by PT    Recommendations for Other Services       Precautions / Restrictions Precautions Precautions: Back;Fall Precaution Booklet Issued: Yes (comment) Precaution Comments: Reviewed back precautions Required Braces or Orthoses: Spinal Brace Spinal Brace: Lumbar corset;Applied in sitting position Restrictions Weight Bearing Restrictions: No      Mobility  Bed Mobility Overal bed mobility: Needs Assistance Bed Mobility: Rolling;Sidelying to Sit Rolling: Mod assist Sidelying to sit: Mod assist       General bed mobility comments: Educated pt on proper transition to sitting including log roll, reinforced precautions with bed mobiltiy  Transfers Overall transfer level: Needs assistance Equipment used: Rolling walker (2 wheeled) Transfers: Sit  to/from UGI CorporationStand;Stand Pivot Transfers Sit to Stand: Mod assist Stand pivot transfers: Mod assist       General transfer comment: Able to stand pivot transfer with use of RW for UE and increased time  Ambulation/Gait Ambulation/Gait assistance: Mod assist;Max assist Ambulation Distance (Feet): 45 Feet Assistive device: Rolling walker (2 wheeled) Gait Pattern/deviations: Shuffle;Decreased stride length Gait velocity: Extremely slow Gait velocity interpretation: Below normal speed for age/gender General Gait Details: Needed +2 for ambulation, 1 moderate assist and 1 for chair management.  Short shuffling steps and heavy reliance on B UE on RW as well as external support.  Prior to surgery was not ambulating community distances.  Stairs            Wheelchair Mobility    Modified Rankin (Stroke Patients Only)       Balance Overall balance assessment: Needs assistance         Standing balance support: Bilateral upper extremity supported;During functional activity Standing balance-Leahy Scale: Poor Standing balance comment: Pt with poor standing balance, needed moderate assist with ambulation and demonstrated heavy reliance on B UE on RW, as well as increased stance time and smaller steps to maintain base of support                             Pertinent Vitals/Pain Pain Assessment: 0-10 Pain Score: 3  Pain Location: back Pain Descriptors / Indicators: Aching Pain Intervention(s): Monitored during session    Home Living Family/patient expects to be discharged to:: Private residence Living Arrangements: Spouse/significant other Available Help at Discharge: Family;Available 24 hours/day Type of Home: House Home Access: Ramped entrance  Home Layout: One level Home Equipment: Wheelchair - power;Walker - 2 wheels;Shower seat Additional Comments: Previously used mostly used power WC for ambulation    Prior Function Level of Independence: Needs assistance    Gait / Transfers Assistance Needed: primarily uses electric scooter, amb minimally with RW  ADL's / Homemaking Assistance Needed: min assist until about a week ago when he was dependent with ADLs        Hand Dominance   Dominant Hand: Right    Extremity/Trunk Assessment   Upper Extremity Assessment: Overall WFL for tasks assessed           Lower Extremity Assessment: grossly 3+/5       Cervical / Trunk Assessment: Normal  Communication   Communication: No difficulties  Cognition Arousal/Alertness: Awake/alert Behavior During Therapy: WFL for tasks assessed/performed Overall Cognitive Status: Within Functional Limits for tasks assessed                      General Comments General comments (skin integrity, edema, etc.): Pt educated on use of back brace and applicating in sitting prior to functional activity.     Exercises        Assessment/Plan    PT Assessment Patient needs continued PT services  PT Diagnosis Difficulty walking;Abnormality of gait;Generalized weakness;Acute pain   PT Problem List Decreased strength;Decreased range of motion;Decreased balance;Decreased activity tolerance;Decreased mobility;Decreased knowledge of use of DME;Pain;Decreased knowledge of precautions  PT Treatment Interventions DME instruction;Gait training;Stair training;Functional mobility training;Therapeutic activities;Therapeutic exercise;Balance training;Neuromuscular re-education;Patient/family education   PT Goals (Current goals can be found in the Care Plan section) Acute Rehab PT Goals Patient Stated Goal: Get stronger PT Goal Formulation: With patient/family Time For Goal Achievement: 08/06/14 Potential to Achieve Goals: Good    Frequency Min 5X/week   Barriers to discharge Other (comment)  Pt spouse unable to provide physical assist    Co-evaluation               End of Session Equipment Utilized During Treatment: Gait belt;Back brace Activity  Tolerance: Patient tolerated treatment well Patient left: in chair;with call bell/phone within reach;with family/visitor present Nurse Communication: Mobility status         Time: 1610-9604 PT Time Calculation (min) (ACUTE ONLY): 45 min   Charges:   PT Evaluation $Initial PT Evaluation Tier I: 1 Procedure PT Treatments $Gait Training: 8-22 mins $Therapeutic Activity: 8-22 mins   PT G Codes:        Evan [name],Evan [name] 08-16-14, 11:47 AM  Vella Raring, SPT (student physical therapist) Office phone: [phone]  Agree with below assessment.  Lewis Shock, PT, DPT Pager #: [phone] Office #: [phone]

## 2014-07-24 MED ORDER — FEBUXOSTAT 40 MG PO TABS
40.0000 mg | ORAL_TABLET | Freq: Every day | ORAL | Status: DC | PRN
  Filled 2014-07-24: qty 1

## 2014-07-24 MED ORDER — CIPROFLOXACIN HCL 500 MG PO TABS
500.0000 mg | ORAL_TABLET | Freq: Two times a day (BID) | ORAL | Status: DC
  Administered 2014-07-24 – 2014-07-25 (×2): 500 mg via ORAL
  Filled 2014-07-24 (×2): qty 1

## 2014-07-24 NOTE — Progress Notes (Signed)
Physical Therapy Treatment Patient Details Name: Evan [name] MRN: [id number] DOB: [birth year] Today's Date: 07/24/2014    History of Present Illness Patient is a 74 year old with long-standing back pain that over the past few weeks and months has gotten progressively worse with increased difficulty with ambulation and proximal leg weakness.  Prior to surgery, pt was only ambulating with RW once every 2-3 days and primarily used motorized WC.  Pt is s/p posterior interlumbar fusion L4-L5 on 07/22/2014.     PT Comments    Pt progressing towards PT goals, but ambulation and standing activities were limited today by R big toe and foot pain.  Pt has previously had gout, and nurse was notified of new symptoms.  Current plan for d/c to [facility] remains appropriate per medical clearance and d/c.    Follow Up Recommendations  SNF     Equipment Recommendations  None recommended by PT    Recommendations for Other Services       Precautions / Restrictions Precautions Precautions: Back;Fall Precaution Booklet Issued: Yes (comment) Precaution Comments: Reviewed back precautions; pt able to verbalize 2/3, reinforced no arching or twisting Required Braces or Orthoses: Spinal Brace Spinal Brace: Lumbar corset;Applied in sitting position Restrictions Weight Bearing Restrictions: No    Mobility  Bed Mobility Overal bed mobility: Needs Assistance Bed Mobility: Rolling;Sidelying to Sit Rolling: Mod assist Sidelying to sit: Mod assist       General bed mobility comments: Pt still demonstrates difficulty rolling entire body, and requires assist to roll hips and press up to sitting   Transfers Overall transfer level: Needs assistance Equipment used: Rolling walker (2 wheeled) Transfers: Sit to/from Stand Sit to Stand: Mod assist         General transfer comment: Pt did not demonstrate safe hand placement to transfer, despite max v/c's. Had B UE on walker when transferring sit to  stand instead of pressing up from bed  Ambulation/Gait Ambulation/Gait assistance: Min assist Ambulation Distance (Feet): 25 Feet Assistive device: Rolling walker (2 wheeled) Gait Pattern/deviations: Shuffle;Decreased step length - right Gait velocity: Extremely slow Gait velocity interpretation: <1.8 ft/sec, indicative of risk for recurrent falls General Gait Details: R toe pain, consistent with pain during previous gout episodes, nurse notified of change, difficulty initiating R LE during ambulation due to pain, needed +2 for ambulation, 1 moderate assist and 1 for chair management.  Short shuffling steps and heavy reliance on B UE on RW as well as external support.   Stairs            Wheelchair Mobility    Modified Rankin (Stroke Patients Only)       Balance Overall balance assessment: Needs assistance         Standing balance support: Bilateral upper extremity supported;During functional activity Standing balance-Leahy Scale: Poor Standing balance comment: Balance during functional activities more difficult due to R toe/foot pain and difficulty initiating movement of R LE.                     Cognition Arousal/Alertness: Awake/alert Behavior During Therapy: WFL for tasks assessed/performed Overall Cognitive Status: Within Functional Limits for tasks assessed       Memory: Decreased recall of precautions              Exercises      General Comments General comments (skin integrity, edema, etc.): Pain and symptmoms in R big toe/foot consistent with symptoms during previous episodes of gout, nurse was notified of changes  Pertinent Vitals/Pain Pain Assessment: No/denies pain Pain Score: 0-No pain Pain Location: Pt denies pain at surgical site, but had onset of R toe/foot pain with ambulation, pt has history of gout Pain Descriptors / Indicators: Guarding Pain Intervention(s): Monitored during session;Premedicated before session    Home  Living                      Prior Function            PT Goals (current goals can now be found in the care plan section) Acute Rehab PT Goals Patient Stated Goal: Go to SNF Progress towards PT goals: Progressing toward goals    Frequency  Min 5X/week    PT Plan Current plan remains appropriate    Co-evaluation             End of Session Equipment Utilized During Treatment: Gait belt;Back brace Activity Tolerance: Patient limited by pain Patient left: in chair;with call bell/phone within reach;with family/visitor present     Time: 0822-0854 PT Time Calculation (min) (ACUTE ONLY): 32 min  Charges:  $Gait Training: 8-22 mins $Therapeutic Activity: 8-22 mins                    G Codes:      Brizza Nathanson 07/24/2014, 12:18 PM  Vella RaringKailee Amanuel [place]inkfield, SPT (student physical therapist) Office phone: [phone]

## 2014-07-24 NOTE — Progress Notes (Signed)
Subjective: Patient reports Doing well no leg pain minimal back soreness  Objective: Vital signs in last 24 hours: Temp:  [98 F (36.7 C)-99 F (37.2 C)] 98.2 F (36.8 C) (04/06 0527) Pulse Rate:  [68-88] 68 (04/06 0527) Resp:  [18] 18 (04/06 0527) BP: (91-116)/(44-48) 92/47 mmHg (04/06 0527) SpO2:  [93 %-96 %] 96 % (04/06 0527)  Intake/Output from previous day: 04/05 0701 - 04/06 0700 In: 483 [P.O.:480; I.V.:3] Out: 2600 [Urine:2550; Drains:50] Intake/Output this shift:    Progressive mobilization today strength 5 out of 5 wound clean dry and intact  Lab Results: No results for input(s): WBC, HGB, HCT, PLT in the last 72 hours. BMET  Recent Labs  07/23/14 0837  CREATININE 0.95    Studies/Results: Dg Lumbar Spine 2-3 Views  07/22/2014   CLINICAL DATA:  Posterior fusion.  EXAM: DG C-ARM 61-120 MIN; LUMBAR SPINE - 2-3 VIEW  FLUOROSCOPY TIME:  Fluoroscopy Time (in minutes and seconds): 44 seconds  Number of Acquired Images:  2  COMPARISON:  MRI 05/23/2014.  FINDINGS: Pedicle screws and interbody fusion device noted L4-L5. Good anatomic alignment.  IMPRESSION: Postsurgical changes L4-L5.  Good anatomic alignment .   Electronically Signed   By: Maisie Fushomas  Register   On: 07/22/2014 10:55   Dg C-arm 61-120 Min  07/22/2014   CLINICAL DATA:  Posterior fusion.  EXAM: DG C-ARM 61-120 MIN; LUMBAR SPINE - 2-3 VIEW  FLUOROSCOPY TIME:  Fluoroscopy Time (in minutes and seconds): 44 seconds  Number of Acquired Images:  2  COMPARISON:  MRI 05/23/2014.  FINDINGS: Pedicle screws and interbody fusion device noted L4-L5. Good anatomic alignment.  IMPRESSION: Postsurgical changes L4-L5.  Good anatomic alignment .   Electronically Signed   By: Maisie Fushomas  Register   On: 07/22/2014 10:55    Assessment/Plan: PT OT working on placement patient will be stable for discharge when bed available at [facility].  LOS: 2 days     Abb Gobert P 07/24/2014, 7:58 AM

## 2014-07-25 LAB — URINALYSIS, ROUTINE W REFLEX MICROSCOPIC
Bilirubin Urine: NEGATIVE
Glucose, UA: NEGATIVE mg/dL
Ketones, ur: NEGATIVE mg/dL
NITRITE: NEGATIVE
PROTEIN: NEGATIVE mg/dL
Specific Gravity, Urine: 1.013 (ref 1.005–1.030)
UROBILINOGEN UA: 1 mg/dL (ref 0.0–1.0)
pH: 7 (ref 5.0–8.0)

## 2014-07-25 LAB — URINE MICROSCOPIC-ADD ON

## 2014-07-25 MED ORDER — OXYCODONE-ACETAMINOPHEN 5-325 MG PO TABS: 1.0000 | ORAL_TABLET | ORAL | Status: DC | PRN

## 2014-07-25 NOTE — Progress Notes (Signed)
PT Cancellation Note  Patient Details Name: Evan HillockRichard A [name] MRN: [id number] DOB: [birth year]   Cancelled Treatment:    Reason Eval/Treat Not Completed: Other (comment) Attempted to see pt this AM however pt getting ready to leave to go to [facility]. Will follow up if pt still here.  Alvie HeidelbergFolan, [name] A 07/25/2014, 11:42 AM Alvie HeidelbergShauna Folan, PT, DPT [phone]

## 2014-07-25 NOTE — Discharge Summary (Signed)
  Physician Discharge Summary  Patient ID: Evan [name] MRN: [id number] DOB/AGE: [birth year] 74 y.o.  Admit date: 07/22/2014 Discharge date: 07/25/2014  Admission Diagnoses: Grade 1 spondylolisthesis L4-5 and lumbar spinal stenosis  Discharge Diagnoses: Samegood Active Problems:   Spondylolisthesis at L4-L5 level   Discharged Condition: good  Hospital Course: Patient admitted hospital underwent decompressive laminotomy and fusion L4-5. Postoperatively patient did very well recovered in the floor on the floor he was ambulating well was voiding but had to have a condom cath placed for intermittent incontinence that he has had a chronic problem with. His leg pain significantly improved from preop was ambulate much better than preoperatively denied any new numbness or tingling in his legs and feet. Patient was stable for discharge to a nursing facility on postoperative day 3.  Consults: Significant Diagnostic Studies: Treatments: Decompression fusion L4-5 Discharge Exam: Blood pressure 92/51, pulse 81, temperature 99 F (37.2 C), temperature source Oral, resp. rate 20, height 5' 5.5" (1.664 m), weight 93.441 kg (206 lb), SpO2 95 %. Strength out of 5 wound clean dry and intact  Disposition: [facility]     Medication List    TAKE these medications        CENTRUM SILVER ADULT 50+ PO  Take 1 tablet by mouth daily.     febuxostat 40 MG tablet  Commonly known as:  ULORIC  Take 40 mg by mouth as needed. Takes only as needed     Fish Oil 1000 MG Caps  Take 1,000 mg by mouth daily.     furosemide 40 MG tablet  Commonly known as:  LASIX  Take 40 mg by mouth 2 (two) times daily.     HYDROmorphone 4 MG tablet  Commonly known as:  DILAUDID  Take 4 mg by mouth every 4 (four) hours as needed for severe pain.     niacin 500 MG tablet  Take 500 mg by mouth at bedtime.     omeprazole 20 MG tablet  Commonly known as:  PRILOSEC OTC  Take 20 mg by mouth daily.     oxyCODONE-acetaminophen 5-325 MG per tablet  Commonly known as:  PERCOCET/ROXICET  Take 1-2 tablets by mouth every 4 (four) hours as needed for moderate pain.     potassium chloride 10 MEQ CR capsule  Commonly known as:  MICRO-K  Take 10 mEq by mouth daily.     PROBIOTIC PO  Take 1 capsule by mouth daily.     sertraline 25 MG tablet  Commonly known as:  ZOLOFT  Take 25 mg by mouth daily.     SYSTANE OP  Place 2 drops into both eyes at bedtime.     tamsulosin 0.4 MG Caps capsule  Commonly known as:  FLOMAX  Take 0.8 mg by mouth daily.         Signed: [name] P 07/25/2014, 7:25 AM

## 2014-07-25 NOTE — Progress Notes (Signed)
Patient ID: Evan [name], male   DOB: 26-Sep-1940, 74 y.o.   MRN: [id number] Doing well no leg pain  Neurologically improved ambulating better strength 5 out of 5 wound clean dry and intact  DC Hemovac DC drain transfer to SNF

## 2014-07-25 NOTE — Progress Notes (Signed)
Patient is discharged from room 4N22 at this time. Alert and in stable condition. IV site d/c'd as well as Hemovac. Report given to receiving nurse, Littie DeedsNikki [facility]. Transported out of unit via stretcher by [facility]AR with wife and belongings at side.

## 2014-07-26 LAB — URINE CULTURE
CULTURE: NO GROWTH
Colony Count: NO GROWTH

## 2014-09-02 ENCOUNTER — Telehealth: Payer: Self-pay | Admitting: *Deleted

## 2014-09-02 ENCOUNTER — Encounter: Payer: Self-pay | Admitting: *Deleted

## 2014-09-02 NOTE — Telephone Encounter (Signed)
Unable to reach patient at time of Pre-Visit Call.  Left message for patient to return call when available.    

## 2014-09-02 NOTE — Telephone Encounter (Signed)
Pre-Visit Call completed with patient and chart updated.   Pre-Visit Info documented in Specialty Comments under [employer].    

## 2014-09-03 ENCOUNTER — Encounter: Payer: Self-pay | Admitting: Physician Assistant

## 2014-09-03 ENCOUNTER — Ambulatory Visit (INDEPENDENT_AMBULATORY_CARE_PROVIDER_SITE_OTHER): Payer: Medicare Other | Admitting: Physician Assistant

## 2014-09-03 ENCOUNTER — Ambulatory Visit: Payer: Medicare Other | Admitting: Physician Assistant

## 2014-09-03 VITALS — BP 139/74 | HR 84 | Temp 97.9°F | Ht 65.5 in | Wt 192.8 lb

## 2014-09-03 DIAGNOSIS — F418 Other specified anxiety disorders: Secondary | ICD-10-CM | POA: Insufficient documentation

## 2014-09-03 DIAGNOSIS — M1A9XX0 Chronic gout, unspecified, without tophus (tophi): Secondary | ICD-10-CM | POA: Insufficient documentation

## 2014-09-03 DIAGNOSIS — F419 Anxiety disorder, unspecified: Secondary | ICD-10-CM

## 2014-09-03 DIAGNOSIS — N400 Enlarged prostate without lower urinary tract symptoms: Secondary | ICD-10-CM

## 2014-09-03 DIAGNOSIS — K219 Gastro-esophageal reflux disease without esophagitis: Secondary | ICD-10-CM

## 2014-09-03 DIAGNOSIS — F329 Major depressive disorder, single episode, unspecified: Secondary | ICD-10-CM

## 2014-09-03 DIAGNOSIS — I5032 Chronic diastolic (congestive) heart failure: Secondary | ICD-10-CM | POA: Diagnosis not present

## 2014-09-03 MED ORDER — SERTRALINE HCL 25 MG PO TABS: 25.0000 mg | ORAL_TABLET | Freq: Every day | ORAL | Status: DC

## 2014-09-03 NOTE — Progress Notes (Signed)
Pre visit review using our clinic review tool, if applicable. No additional management support is needed unless otherwise documented below in the visit note. 

## 2014-09-03 NOTE — Assessment & Plan Note (Signed)
Stable.  Sertraline refilled. Continue current regimen.  Follow-up 6 months.

## 2014-09-03 NOTE — Patient Instructions (Signed)
Please continue medications as directed. Stay well hydrated and eat a well-balanced diet. Follow-up with your specialists as directed. Follow-up with me every 6 months and when needed. Return for a physical when you are due -- check with your insurance.

## 2014-09-03 NOTE — Assessment & Plan Note (Signed)
Stable. Asymptomatic. Followed by Cardiology.  Referral placed for insurance purposes.  Patient to schedule follow-up with Dr. Rennis GoldenHilty.

## 2014-09-03 NOTE — Progress Notes (Signed)
Patient presents to clinic today to establish care.  Acute Concerns: Denies acute concerns at today's visit.  Chronic Issues: BPH -- Followed by Urology, Dr. Hartley Barefoot.  Is s/p TURP procedure. On Flomax daily with good relief of symptoms.  Gout -- On prophylaxis with Uloric.  Has colchicine for flares.  Last flare 6 months ago per patient.  Is watching intake of gout-exacerbating foods.  GERD -- On Prilosec OTC as needed. Endorses good control of symptoms with medication.  Anxiety/Depression -- Endorses well controlled with Sertraline 25 mg daily.  Denies depressed mood, anhedonia or panic attacks with current regimen.  Denies SI/HI.  Diastolic Heart Failure, controlled -- followed by Dr. Debara Pickett.  Needs new referral. Denies orthopnea or SOBOE.  Uses Lasix as directed for peripheral edema with good relief.  Health Maintenance: Dental -- up-to-date Vision -- up-to-date Immunizations -- Is unsure of Tetanus but feels was given before surgery a few months ago. Colonoscopy -- Overdue.  Patient declines screening.  Past Medical History  Diagnosis Date  . BPH (benign prostatic hyperplasia)     takes Flomax daily  . Arthritis     spine  . Gout, arthritis     feet  . Hard of hearing     does not wear hearing aides  . Depression     takes Zoloft daily  . GERD (gastroesophageal reflux disease)     takes Omeprazole daily  . Hyperlipidemia     takes Niacin daily  . Peripheral edema     takes lasix daily  . Shortness of breath     with exertion  . Pneumonia 2015  . History of bronchitis     "long time ago"  . Migraine     r/t neck issues  . Loss of balance     related to back   . Numbness     hands and related to neck  . Joint pain   . Chronic back pain     stenosis  . Gout     takes Allouprinol and Colchicine daily  . Diverticulosis   . Cataract     immature left eye    Past Surgical History  Procedure Laterality Date  . Neck surgery  2009    Cervical Fusion x3    . Ulnar nerve repair Left 2010  . Tonsillectomy    . Cholecystectomy  10+ years ago  . Cardiovascular stress test  18 YEARS ago  . Cervical fusion      x 5  . Back surgery    . Lumbar fusion  2011  . Hernia repair    . Posterior cervical fusion/foraminotomy  06/09/2011    Procedure: POSTERIOR CERVICAL FUSION/FORAMINOTOMY LEVEL 4;  Surgeon: Elaina Hoops, MD;  Location: [facility];  Service: Neurosurgery;  Laterality: N/A;  Cervical three to seven  posterior cervical fusion, Cervical four to seven Laminectomy, bone graft from right iliac crest   . Lumbar laminectomy/decompression microdiscectomy  10/29/2011    Procedure: LUMBAR LAMINECTOMY/DECOMPRESSION MICRODISCECTOMY 1 LEVEL;  Surgeon: Elaina Hoops, MD;  Location: [facility] [facility];  Service: Neurosurgery;  Laterality: Right;  Right Lumbar Three-Four Lumbar Laminectomy/Microdiscectomy  . Esophagogastroduodenoscopy    . Colonoscopy    . Lumbar fusion  07/23/2014    Post lumbar interbody level 1   . Maximum access (mas)posterior lumbar interbody fusion (plif) 1 level N/A 07/22/2014    Procedure: FOR MAXIMUM ACCESS (MAS) POSTERIOR LUMBAR INTERBODY FUSION (PLIF) 1 LEVEL;  Surgeon: Kary Kos, MD;  Location: [facility] [facility];  Service: Neurosurgery;  Laterality: N/A;  FOR MAXIMUM ACCESS (MAS) POSTERIOR LUMBAR INTERBODY FUSION (PLIF) 1 LEVEL L4-5    Current Outpatient Prescriptions on File Prior to Visit  Medication Sig Dispense Refill  . colchicine 0.6 MG tablet Take 1 tablet by mouth as needed.    . cyclobenzaprine (FLEXERIL) 10 MG tablet Take 10 mg by mouth 3 (three) times daily as needed for muscle spasms.    . febuxostat (ULORIC) 40 MG tablet Take 40 mg by mouth as needed. Takes only as needed    . furosemide (LASIX) 40 MG tablet Take 40 mg by mouth 2 (two) times daily.    . Multiple Vitamins-Minerals (CENTRUM SILVER ADULT 50+ PO) Take 1 tablet by mouth daily.    . niacin 500 MG tablet Take 500 mg by mouth at bedtime.    . Omega-3 Fatty Acids (FISH  OIL) 1000 MG CAPS Take 1,000 mg by mouth daily.    Marland Kitchen omeprazole (PRILOSEC OTC) 20 MG tablet Take 20 mg by mouth daily.    Marland Kitchen oxyCODONE-acetaminophen (PERCOCET/ROXICET) 5-325 MG per tablet Take 1-2 tablets by mouth every 4 (four) hours as needed for moderate pain. 80 tablet 0  . potassium chloride (MICRO-K) 10 MEQ CR capsule Take 10 mEq by mouth daily.    . Probiotic Product (PROBIOTIC PO) Take 1 capsule by mouth daily.    . tamsulosin (FLOMAX) 0.4 MG CAPS capsule Take 0.8 mg by mouth daily.     Vladimir Faster Glycol-Propyl Glycol (SYSTANE OP) Place 2 drops into both eyes at bedtime.     No current facility-administered medications on file prior to visit.    Allergies  Allergen Reactions  . Cephalosporins Other (See Comments)    Oral thrush  . Oxycodone Itching and Nausea Only    No family history on file.  History   Social History  . Marital Status: Married    Spouse Name: N/A  . Number of Children: N/A  . Years of Education: N/A   Occupational History  . Retired    Social History Main Topics  . Smoking status: Current Some Day Smoker -- 1.50 packs/day for 55 years    Types: Cigarettes  . Smokeless tobacco: Current User  . Alcohol Use: No  . Drug Use: No  . Sexual Activity: Not Currently   Other Topics Concern  . Not on file   Social History Narrative   Review of Systems  Constitutional: Negative for fever, weight loss and malaise/fatigue.  HENT: Negative for hearing loss.   Eyes: Negative for blurred vision and double vision.  Respiratory: Negative for cough and shortness of breath.   Cardiovascular: Negative for chest pain, palpitations and orthopnea.  Gastrointestinal: Negative for heartburn, nausea, vomiting and abdominal pain.  Neurological: Negative for headaches.  Psychiatric/Behavioral: Negative for depression, suicidal ideas, hallucinations and substance abuse. The patient is not nervous/anxious and does not have insomnia.    BP 139/74 mmHg  Pulse 84   Temp(Src) 97.9 F (36.6 C) (Oral)  Ht 5' 5.5" (1.664 m)  Wt 192 lb 12.8 oz (87.454 kg)  BMI 31.58 kg/m2  SpO2 99%  Physical Exam  Constitutional: He is oriented to person, place, and time and well-developed, well-nourished, and in no distress.  HENT:  Head: Normocephalic and atraumatic.  Right Ear: External ear normal.  Left Ear: External ear normal.  Nose: Nose normal.  Mouth/Throat: Oropharynx is clear and moist. No oropharyngeal exudate.  TM within normal limits bilaterally.  Eyes: Conjunctivae  and EOM are normal. Pupils are equal, round, and reactive to light.  Neck: Neck supple.  Cardiovascular: Normal rate, regular rhythm, normal heart sounds and intact distal pulses.   Pulmonary/Chest: Effort normal and breath sounds normal. No respiratory distress. He has no wheezes. He has no rales. He exhibits no tenderness.  Neurological: He is alert and oriented to person, place, and time.  Skin: Skin is warm and dry. No rash noted.  Psychiatric: Affect normal.  Vitals reviewed.  Recent Results (from the past 2160 hour(s))  Surgical pcr screen     Status: None   Collection Time: 07/15/14 11:12 AM  Result Value Ref Range   MRSA, PCR NEGATIVE NEGATIVE   Staphylococcus aureus NEGATIVE NEGATIVE    Comment:        The Xpert SA Assay (FDA approved for NASAL specimens in patients over 55 years of age), is one component of a comprehensive surveillance program.  Test performance has been validated by [facility] for patients greater than or equal to 63 year old. It is not intended to diagnose infection nor to guide or monitor treatment.   Basic metabolic panel     Status: Abnormal   Collection Time: 07/15/14 11:13 AM  Result Value Ref Range   Sodium 138 135 - 145 mmol/L   Potassium 3.9 3.5 - 5.1 mmol/L   Chloride 104 96 - 112 mmol/L   CO2 23 19 - 32 mmol/L   Glucose, Bld 136 (H) 70 - 99 mg/dL   BUN 17 6 - 23 mg/dL   Creatinine, Ser 0.77 0.50 - 1.35 mg/dL   Calcium 8.8 8.4 -  10.5 mg/dL   GFR calc non Af Amer 88 (L) >90 mL/min   GFR calc Af Amer >90 >90 mL/min    Comment: (NOTE) The eGFR has been calculated using the CKD EPI equation. This calculation has not been validated in all clinical situations. eGFR's persistently <90 mL/min signify possible Chronic Kidney Disease.    Anion gap 11 5 - 15  CBC     Status: None   Collection Time: 07/15/14 11:13 AM  Result Value Ref Range   WBC 8.1 4.0 - 10.5 K/uL   RBC 4.25 4.22 - 5.81 MIL/uL   Hemoglobin 13.2 13.0 - 17.0 g/dL   HCT 39.4 39.0 - 52.0 %   MCV 92.7 78.0 - 100.0 fL   MCH 31.1 26.0 - 34.0 pg   MCHC 33.5 30.0 - 36.0 g/dL   RDW 13.2 11.5 - 15.5 %   Platelets 251 150 - 400 K/uL  Type and screen     Status: None   Collection Time: 07/15/14 11:20 AM  Result Value Ref Range   ABO/RH(D) O NEG    Antibody Screen NEG    Sample Expiration 07/29/2014   ABO/Rh     Status: None   Collection Time: 07/15/14 11:20 AM  Result Value Ref Range   ABO/RH(D) O NEG   Creatinine, serum     Status: Abnormal   Collection Time: 07/23/14  8:37 AM  Result Value Ref Range   Creatinine, Ser 0.95 0.50 - 1.35 mg/dL   GFR calc non Af Amer 81 (L) >90 mL/min   GFR calc Af Amer >90 >90 mL/min    Comment: (NOTE) The eGFR has been calculated using the CKD EPI equation. This calculation has not been validated in all clinical situations. eGFR's persistently <90 mL/min signify possible Chronic Kidney Disease.   Urinalysis, Routine w reflex microscopic     Status:  Abnormal   Collection Time: 07/24/14 11:12 PM  Result Value Ref Range   Color, Urine YELLOW YELLOW   APPearance CLEAR CLEAR   Specific Gravity, Urine 1.013 1.005 - 1.030   pH 7.0 5.0 - 8.0   Glucose, UA NEGATIVE NEGATIVE mg/dL   Hgb urine dipstick MODERATE (A) NEGATIVE   Bilirubin Urine NEGATIVE NEGATIVE   Ketones, ur NEGATIVE NEGATIVE mg/dL   Protein, ur NEGATIVE NEGATIVE mg/dL   Urobilinogen, UA 1.0 0.0 - 1.0 mg/dL   Nitrite NEGATIVE NEGATIVE   Leukocytes, UA  TRACE (A) NEGATIVE  Culture, Urine     Status: None   Collection Time: 07/24/14 11:12 PM  Result Value Ref Range   Specimen Description URINE, RANDOM    Special Requests NONE    Colony Count NO GROWTH Performed at [facility]     Culture NO GROWTH Performed at [facility]     Report Status 07/26/2014 FINAL   Urine microscopic-add on     Status: Abnormal   Collection Time: 07/24/14 11:12 PM  Result Value Ref Range   Squamous Epithelial / LPF RARE RARE   WBC, UA 0-2 <3 WBC/hpf   RBC / HPF 3-6 <3 RBC/hpf   Bacteria, UA RARE RARE   Casts HYALINE CASTS (A) NEGATIVE    Assessment/Plan: Gout, chronic Stable.  Continue current prophylactic regimen.   Gastroesophageal reflux disease without esophagitis Stable with OTC medications PRN.  GERD diet discussed.  Handout given. Continue current regimen.   Diastolic dysfunction with heart failure Stable. Asymptomatic. Followed by Cardiology.  Referral placed for insurance purposes.  Patient to schedule follow-up with Dr. Debara Pickett.   BPH (benign prostatic hyperplasia) Followed by Urology.  Stable. Continue current regimen.   Anxiety and depression Stable.  Sertraline refilled. Continue current regimen.  Follow-up 6 months.

## 2014-09-03 NOTE — Assessment & Plan Note (Signed)
Followed by Urology.  Stable. Continue current regimen.

## 2014-09-03 NOTE — Assessment & Plan Note (Signed)
Stable.  Continue current prophylactic regimen.

## 2014-09-03 NOTE — Assessment & Plan Note (Signed)
Stable with OTC medications PRN.  GERD diet discussed.  Handout given. Continue current regimen.

## 2014-09-23 ENCOUNTER — Telehealth: Payer: Self-pay | Admitting: Physician Assistant

## 2014-09-23 NOTE — Telephone Encounter (Signed)
Relation to pt: self  Call back number: [phone] Pharmacy: [facility] - [place] - [address] [place] [phone] (Phone) [phone] (Fax)         Reason for call:  Pt requesting a refill  furosemide (LASIX) 40 MG tablet  tamsulosin (FLOMAX) 0.4 MG CAPS capsule  potassium chloride (MICRO-K) 10 MEQ CR capsule

## 2014-09-24 MED ORDER — TAMSULOSIN HCL 0.4 MG PO CAPS: 0.8000 mg | ORAL_CAPSULE | Freq: Every day | ORAL | Status: DC

## 2014-09-24 MED ORDER — FUROSEMIDE 40 MG PO TABS
40.0000 mg | ORAL_TABLET | Freq: Two times a day (BID) | ORAL | Status: DC
Start: 2014-09-24 — End: 2015-12-09

## 2014-09-24 MED ORDER — POTASSIUM CHLORIDE ER 10 MEQ PO CPCR: 10.0000 meq | ORAL_CAPSULE | Freq: Every day | ORAL | Status: DC

## 2014-09-24 NOTE — Telephone Encounter (Signed)
[facility] [phone]) asking the pt to RTC regarding med request.//AB/CMA

## 2014-09-24 NOTE — Telephone Encounter (Signed)
Rx's sent to the pharmacy by e-script.//AB/CMA 

## 2014-10-16 ENCOUNTER — Ambulatory Visit: Payer: Medicare Other | Admitting: Internal Medicine

## 2014-11-01 ENCOUNTER — Telehealth: Payer: Self-pay | Admitting: Physician Assistant

## 2014-11-01 NOTE — Telephone Encounter (Signed)
Relation to pt: self Call back [phone] Pharmacy: [facility] - [place] - [address] [phone] (Phone) [phone])         Reason for call:  Patient requesting a refill potassium chloride (MICRO-K) 10 MEQ CR capsule

## 2014-11-01 NOTE — Telephone Encounter (Signed)
Called and spoke with the pt and informed him that the Potassium was fillled on (09/24/14) #90,1.  Pt verbalized understanding.//AB/CMA

## 2014-11-26 ENCOUNTER — Telehealth: Payer: Self-pay | Admitting: Internal Medicine

## 2014-11-26 ENCOUNTER — Encounter: Payer: Self-pay | Admitting: Internal Medicine

## 2014-11-29 NOTE — Telephone Encounter (Signed)
Close encounter 

## 2014-12-10 ENCOUNTER — Ambulatory Visit: Payer: Medicare Other | Admitting: Internal Medicine

## 2014-12-20 ENCOUNTER — Encounter: Payer: Self-pay | Admitting: Physician Assistant

## 2014-12-20 ENCOUNTER — Ambulatory Visit (INDEPENDENT_AMBULATORY_CARE_PROVIDER_SITE_OTHER): Payer: Medicare Other | Admitting: Physician Assistant

## 2014-12-20 VITALS — BP 118/82 | HR 64 | Temp 97.7°F | Resp 16 | Ht 65.5 in | Wt 205.1 lb

## 2014-12-20 DIAGNOSIS — K460 Unspecified abdominal hernia with obstruction, without gangrene: Secondary | ICD-10-CM

## 2014-12-20 DIAGNOSIS — K469 Unspecified abdominal hernia without obstruction or gangrene: Secondary | ICD-10-CM | POA: Insufficient documentation

## 2014-12-20 NOTE — Progress Notes (Signed)
Patient presents to clinic today c/o pain in abdomen just left on umbilicus x 2 days with a noted lump. Patient endorses being told he had weakening in abdominal muscles and a ventral hernia but states this is different. Pain is intermittent and a 2/10. Has not required medication. Patient endorses chronic constipation with last BM 2 days ago. Is passing gas without difficulty. Denies recent trauma or heavy lifting. Denies tenesmus, melena or hematochezia.  Past Medical History  Diagnosis Date  . BPH (benign prostatic hyperplasia)     takes Flomax daily  . Arthritis     spine  . Gout, arthritis     feet  . Hard of hearing     does not wear hearing aides  . Depression     takes Zoloft daily  . GERD (gastroesophageal reflux disease)     takes Omeprazole daily  . Hyperlipidemia     takes Niacin daily  . Peripheral edema     takes lasix daily  . Shortness of breath     with exertion  . Pneumonia 2015  . History of bronchitis     "long time ago"  . Migraine     r/t neck issues  . Loss of balance     related to back   . Numbness     hands and related to neck  . Joint pain   . Chronic back pain     stenosis  . Gout     takes Allouprinol and Colchicine daily  . Diverticulosis   . Cataract     immature left eye    Current Outpatient Prescriptions on File Prior to Visit  Medication Sig Dispense Refill  . colchicine 0.6 MG tablet Take 1 tablet by mouth as needed.    . cyclobenzaprine (FLEXERIL) 10 MG tablet Take 10 mg by mouth 3 (three) times daily as needed for muscle spasms.    . febuxostat (ULORIC) 40 MG tablet Take 40 mg by mouth as needed. Takes only as needed    . furosemide (LASIX) 40 MG tablet Take 1 tablet (40 mg total) by mouth 2 (two) times daily. 180 tablet 1  . Multiple Vitamins-Minerals (CENTRUM SILVER ADULT 50+ PO) Take 1 tablet by mouth daily.    . niacin 500 MG tablet Take 500 mg by mouth at bedtime.    . Omega-3 Fatty Acids (FISH OIL) 1000 MG CAPS Take  1,000 mg by mouth daily.    Marland Kitchen omeprazole (PRILOSEC OTC) 20 MG tablet Take 20 mg by mouth daily.    Bertram Gala Glycol-Propyl Glycol (SYSTANE OP) Place 2 drops into both eyes at bedtime.    . potassium chloride (MICRO-K) 10 MEQ CR capsule Take 1 capsule (10 mEq total) by mouth daily. 90 capsule 1  . Probiotic Product (PROBIOTIC PO) Take 1 capsule by mouth daily.    . sertraline (ZOLOFT) 25 MG tablet Take 1 tablet (25 mg total) by mouth daily. 90 tablet 1  . tamsulosin (FLOMAX) 0.4 MG CAPS capsule Take 2 capsules (0.8 mg total) by mouth daily. 180 capsule 1   No current facility-administered medications on file prior to visit.    Allergies  Allergen Reactions  . Cephalosporins Other (See Comments)    Oral thrush  . Hydromorphone Itching  . Oxycodone Itching and Nausea Only    No family history on file.  Social History   Social History  . Marital Status: Married    Spouse Name: N/A  . Number of Children:  N/A  . Years of Education: N/A   Occupational History  . Retired    Social History Main Topics  . Smoking status: Former Smoker -- 1.50 packs/day for 55 years    Types: Cigarettes  . Smokeless tobacco: Current User  . Alcohol Use: No  . Drug Use: No  . Sexual Activity: Not Currently   Other Topics Concern  . None   Social History Narrative    Review of Systems - See HPI.  All other ROS are negative.  BP 118/82 mmHg  Pulse 64  Temp(Src) 97.7 F (36.5 C) (Oral)  Resp 16  Ht 5' 5.5" (1.664 m)  Wt 205 lb 2 oz (93.044 kg)  BMI 33.60 kg/m2  SpO2 98%  Physical Exam  Constitutional: He is oriented to person, place, and time and well-developed, well-nourished, and in no distress.  HENT:  Head: Normocephalic and atraumatic.  Cardiovascular: Normal rate, regular rhythm, normal heart sounds and intact distal pulses.   Pulmonary/Chest: Effort normal and breath sounds normal. No respiratory distress. He has no wheezes. He has no rales. He exhibits no tenderness.    Abdominal: Bowel sounds are normal. There is no tenderness. There is no CVA tenderness. A hernia is present. Hernia confirmed positive in the umbilical area and confirmed positive in the ventral area. Hernia confirmed negative in the right inguinal area and confirmed negative in the left inguinal area.  Neurological: He is alert and oriented to person, place, and time.  Skin: Skin is warm and dry. No rash noted.  Psychiatric: Affect normal.  Vitals reviewed.   No results found for this or any previous visit (from the past 2160 hour(s)).  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 Is large and non-tender. Is reducible. Also potential small umbilical hernia of left umbilical region with some tenderness without hardness. Is reducible. Will obtain CT to further assess. Bowel regimen discussed and given in AVS.

## 2014-12-20 NOTE — Assessment & Plan Note (Signed)
Ventral hernia noted due to weakening of abdominal musculature only present with straining. Is large and non-tender. Is reducible. Also potential small umbilical hernia of left umbilical region with some tenderness without hardness. Is reducible. Will obtain CT to further assess. Bowel regimen discussed and given in AVS.

## 2014-12-20 NOTE — Progress Notes (Signed)
Pre visit review using our clinic review tool, if applicable. No additional management support is needed unless otherwise documented below in the visit note/SLS  

## 2014-12-20 NOTE — Patient Instructions (Signed)
Please follow the bowel regimen below. You will be contacted for a CT scan to further assess. If anything acutely worsens, please go to the ER.  I encourage you to increase hydration and the amount of fiber in your diet.  Start a daily probiotic (Align, Culturelle, Digestive Advantage, etc.). If no bowel movement within 24 hours, take 2 Tbs of Milk of Magnesia in a 4 oz glass of warmed prune juice every 2-3 days to help promote bowel movement. If no results within 24 hours, then repeat above regimen, adding a Dulcolax stool softener to regimen. If this does not promote a bowel movement, please call the office.

## 2014-12-24 ENCOUNTER — Other Ambulatory Visit: Payer: Self-pay | Admitting: Physician Assistant

## 2014-12-30 ENCOUNTER — Encounter: Payer: Medicare Other | Admitting: Physician Assistant

## 2015-01-16 ENCOUNTER — Ambulatory Visit: Payer: Medicare Other | Admitting: Internal Medicine

## 2015-01-21 ENCOUNTER — Encounter: Payer: Self-pay | Admitting: Physician Assistant

## 2015-01-21 ENCOUNTER — Ambulatory Visit (INDEPENDENT_AMBULATORY_CARE_PROVIDER_SITE_OTHER): Payer: Medicare Other | Admitting: Physician Assistant

## 2015-01-21 VITALS — BP 107/53 | HR 65 | Temp 97.5°F | Resp 16 | Ht 66.0 in | Wt 208.4 lb

## 2015-01-21 DIAGNOSIS — Z23 Encounter for immunization: Secondary | ICD-10-CM | POA: Diagnosis not present

## 2015-01-21 DIAGNOSIS — I5032 Chronic diastolic (congestive) heart failure: Secondary | ICD-10-CM | POA: Diagnosis not present

## 2015-01-21 DIAGNOSIS — Z0000 Encounter for general adult medical examination without abnormal findings: Secondary | ICD-10-CM

## 2015-01-21 DIAGNOSIS — Z125 Encounter for screening for malignant neoplasm of prostate: Secondary | ICD-10-CM

## 2015-01-21 DIAGNOSIS — Z136 Encounter for screening for cardiovascular disorders: Secondary | ICD-10-CM

## 2015-01-21 DIAGNOSIS — G6289 Other specified polyneuropathies: Secondary | ICD-10-CM | POA: Diagnosis not present

## 2015-01-21 DIAGNOSIS — M109 Gout, unspecified: Secondary | ICD-10-CM

## 2015-01-21 NOTE — Progress Notes (Signed)
Subjective:    Evan [name] is a 74 y.o. male who presents for Medicare Annual/Subsequent preventive examination.   Preventive Screening-Counseling & Management  Tobacco History  Smoking status  . Former Smoker -- 1.50 packs/day for 55 years  . Types: Cigarettes  Smokeless tobacco  . Current User    Problems Prior to Visit 1. Peripheral Neuropathy -- Mild and intermittent per patient. Unchanged from previous visits. Denies wanting medication at present.  2. Gout -- Endorses well controlled with Uloric daily. Colchicine for flares. 3. Diastolic Heart Failure -- Followed by Cardiology (Dr. Rennis Golden). Endorses taking Lasix once to twice daily. Denies SOB, PND or orthopnea. Has upcoming follow-up with Cardiology.  Current Problems (verified) Patient Active Problem List   Diagnosis Date Noted  . Screening, ischemic heart disease 01/29/2015  . Visit for preventive health examination 01/29/2015  . Need for prophylactic vaccination and inoculation against influenza 01/29/2015  . Need for vaccination with 13-polyvalent pneumococcal conjugate vaccine 01/29/2015  . Hernia of abdominal cavity 12/20/2014  . Gastroesophageal reflux disease without esophagitis 09/03/2014  . Anxiety and depression 09/03/2014  . BPH (benign prostatic hyperplasia) 09/03/2014  . Gout, chronic 09/03/2014  . Spondylolisthesis at L4-L5 level 07/22/2014  . Diastolic dysfunction with heart failure (HCC) 01/01/2014  . Peripheral neuropathy (HCC) 01/01/2014  . DOE (dyspnea on exertion) 11/15/2013  . Leg edema 11/15/2013  . Snoring 11/15/2013  . COPD pfts pending  05/10/2013    Medications Prior to Visit Current Outpatient Prescriptions on File Prior to Visit  Medication Sig Dispense Refill  . colchicine 0.6 MG tablet Take 1 tablet by mouth as needed.    . cyclobenzaprine (FLEXERIL) 10 MG tablet Take 10 mg by mouth 3 (three) times daily as needed for muscle spasms.    . febuxostat (ULORIC) 40 MG tablet  Take 40 mg by mouth as needed. Takes only as needed    . furosemide (LASIX) 40 MG tablet Take 1 tablet (40 mg total) by mouth 2 (two) times daily. 180 tablet 1  . Multiple Vitamins-Minerals (CENTRUM SILVER ADULT 50+ PO) Take 1 tablet by mouth daily.    . niacin 500 MG tablet Take 500 mg by mouth at bedtime.    . Omega-3 Fatty Acids (FISH OIL) 1000 MG CAPS Take 1,000 mg by mouth daily.    Marland Kitchen omeprazole (PRILOSEC OTC) 20 MG tablet Take 20 mg by mouth daily.    Bertram Gala Glycol-Propyl Glycol (SYSTANE OP) Place 2 drops into both eyes at bedtime.    . potassium chloride (MICRO-K) 10 MEQ CR capsule Take 1 capsule (10 mEq total) by mouth daily. 90 capsule 1  . Probiotic Product (PROBIOTIC PO) Take 1 capsule by mouth daily.    . sertraline (ZOLOFT) 25 MG tablet Take 1 tablet (25 mg total) by mouth daily. 90 tablet 1  . tamsulosin (FLOMAX) 0.4 MG CAPS capsule Take 2 capsules (0.8 mg total) by mouth daily. 180 capsule 1   No current facility-administered medications on file prior to visit.    Current Medications (verified) Current Outpatient Prescriptions  Medication Sig Dispense Refill  . colchicine 0.6 MG tablet Take 1 tablet by mouth as needed.    . cyclobenzaprine (FLEXERIL) 10 MG tablet Take 10 mg by mouth 3 (three) times daily as needed for muscle spasms.    . febuxostat (ULORIC) 40 MG tablet Take 40 mg by mouth as needed. Takes only as needed    . furosemide (LASIX) 40 MG tablet Take 1 tablet (40 mg total) by  mouth 2 (two) times daily. 180 tablet 1  . Multiple Vitamins-Minerals (CENTRUM SILVER ADULT 50+ PO) Take 1 tablet by mouth daily.    . niacin 500 MG tablet Take 500 mg by mouth at bedtime.    . Omega-3 Fatty Acids (FISH OIL) 1000 MG CAPS Take 1,000 mg by mouth daily.    Marland Kitchen omeprazole (PRILOSEC OTC) 20 MG tablet Take 20 mg by mouth daily.    Bertram Gala Glycol-Propyl Glycol (SYSTANE OP) Place 2 drops into both eyes at bedtime.    . potassium chloride (MICRO-K) 10 MEQ CR capsule Take 1  capsule (10 mEq total) by mouth daily. 90 capsule 1  . Probiotic Product (PROBIOTIC PO) Take 1 capsule by mouth daily.    . sertraline (ZOLOFT) 25 MG tablet Take 1 tablet (25 mg total) by mouth daily. 90 tablet 1  . tamsulosin (FLOMAX) 0.4 MG CAPS capsule Take 2 capsules (0.8 mg total) by mouth daily. 180 capsule 1   No current facility-administered medications for this visit.     Allergies (verified) Cephalosporins; Hydromorphone; and Oxycodone   PAST HISTORY  Family History No family history on file.  Social History Social History  Substance Use Topics  . Smoking status: Former Smoker -- 1.50 packs/day for 55 years    Types: Cigarettes  . Smokeless tobacco: Current User  . Alcohol Use: No    Are there smokers in your home (other than you)?  No  Risk Factors Current exercise habits: Home exercise routine includes stationary bike 2-3 x per week for 30 minutes.  Dietary issues discussed: Body mass index is 33.65 kg/(m^2). Endorses well-balanced diet.  Cardiac risk factors: advanced age (older than 81 for men, 65 for women), dyslipidemia, male gender, obesity (BMI >= 30 kg/m2) and sedentary lifestyle.  Depression Screen (Note: if answer to either of the following is "Yes", a more complete depression screening is indicated)   Q1: Over the past two weeks, have you felt down, depressed or hopeless? Yes  Q2: Over the past two weeks, have you felt little interest or pleasure in doing things? No  Have you lost interest or pleasure in daily life? No  Do you often feel hopeless? No  Do you cry easily over simple problems? No   Is currently on Zoloft 25 mg daily. Taking as directed. Does not wish to change medications.  Activities of Daily Living In your present state of health, do you have any difficulty performing the following activities?:  Driving? No Managing money?  No  Feeding yourself? No Getting from bed to chair? Yes Climbing a flight of stairs? Yes Preparing food and  eating?: No Bathing or showering? Yes Getting dressed: No Getting to the toilet? No Using the toilet:No Moving around from place to place: Yes In the past year have you fallen or had a near fall?:No   Are you sexually active?  No  Do you have more than one partner?  N/A  Hearing Difficulties: Yes Do you often ask people to speak up or repeat themselves? Yes Do you experience ringing or noises in your ears? Yes Do you have difficulty understanding soft or whispered voices? Yes   Do you feel that you have a problem with memory? No  Do you often misplace items? Yes  Do you feel safe at home?  Yes  Cognitive Testing  Alert? Yes  Normal Appearance?Yes  Oriented to person? Yes  Place? Yes   Time? Yes  Recall of three objects?  Yes  Can  perform simple calculations? Yes  Displays appropriate judgment?Yes  Can read the correct time from a watch face?Yes   Advanced Directives have been discussed with the patient? Yes   List the Names of Other Physician/Practitioners you currently use: 1.  See Care Team section in EMR for comprehensive list.  Indicate any recent Medical Services you may have received from other than [facility] providers in the past year (date may be approximate).  Immunization History  Administered Date(s) Administered  . Influenza Split 01/17/2013  . Influenza, High Dose Seasonal PF 01/21/2015  . Influenza-Unspecified 12/18/2013  . Pneumococcal Conjugate-13 01/21/2015  . Pneumococcal-Unspecified 12/18/2013  . Zoster 04/19/2013    Screening Tests Health Maintenance  Topic Date Due  . TETANUS/TDAP  02/28/1960  . COLONOSCOPY  09/03/2019 (Originally 04/19/2009)  . INFLUENZA VACCINE  11/18/2015  . ZOSTAVAX  Completed  . PNA vac Low Risk Adult  Completed    All answers were reviewed with the patient and necessary referrals were made:  Piedad Climes, PA-C   01/29/2015   History reviewed: allergies, current medications, past family history, past medical  history, past social history, past surgical history and problem list  Review of Systems A comprehensive review of systems was negative.    Objective:     Vision by Snellen chart: right eye:20/20, left eye:20/20 Blood pressure 107/53, pulse 65, temperature 97.5 F (36.4 C), temperature source Oral, resp. rate 16, height  (1.676 m), weight 208 lb 6 oz (94.518 kg), SpO2 97 %. Body mass index is 33.65 kg/(m^2).  General appearance: alert, cooperative, appears stated age and no distress Head: Normocephalic, without obvious abnormality, atraumatic Eyes: conjunctivae/corneas clear. PERRL, EOM's intact. Fundi benign. Ears: normal TM's and external ear canals both ears Nose: Nares normal. Septum midline. Mucosa normal. No drainage or sinus tenderness. Throat: lips, mucosa, and tongue normal; teeth and gums normal Lungs: clear to auscultation bilaterally Heart: regular rate and rhythm, S1, S2 normal, no murmur, click, rub or gallop Abdomen: soft, non-tender; bowel sounds normal; no masses,  no organomegaly Extremities: extremities normal, atraumatic, no cyanosis or edema Pulses: 2+ and symmetric Skin: Skin color, texture, turgor normal. No rashes or lesions Neurologic: Alert and oriented X 3, normal strength and tone. Normal symmetric reflexes. Normal coordination and gait     Assessment:     (1) Medicare Wellness, Subsequent (2) Peripheral Neuropathy (3) Gout (4) Diastolic Heart Failure (5) Screening for ischemic heart disease (6) Need for vaccination     Plan:     (1) During the course of the visit the patient was educated and counseled about appropriate screening and preventive services including:    Pneumococcal vaccine   Influenza vaccine  Screening electrocardiogram  Prostate cancer screening  Colorectal cancer screening  Nutrition counseling   Advanced directives: has an advanced directive - a copy HAS NOT been provided.  Will obtain fasting labs  today.  (2) Mild. Does not wish to start medications. Will check glucose level today on routine labs. (3) Stable. Well controlled. Medications refilled. (4) Followed by Cardiology. Vitals stable. Continue Lasix. Will check BMP today. (5) EKG reveals NSR. Will obtain lipid panel today. (6) Prevnar and Flu shot given today by nursing staff.   Patient Instructions (the written plan) was given to the patient.  Medicare Attestation I have personally reviewed: The patient's medical and social history Their use of alcohol, tobacco or illicit drugs Their current medications and supplements The patient's functional ability including ADLs,fall risks, home safety risks, cognitive, and hearing  and visual impairment Diet and physical activities Evidence for depression or mood disorders  The patient's weight, height, BMI, and visual acuity have been recorded in the chart.  I have made referrals, counseling, and provided education to the patient based on review of the above and I have provided the patient with a written personalized care plan for preventive services.     Marcelline Mates [place], [place]   01/29/2015

## 2015-01-21 NOTE — Patient Instructions (Signed)
Please continue medications as directed. Please go to the lab for blood work. I will call you with your results. Please follow-up with your specialists as directed. You will be contacted for a formal hearing test.  Follow-up 6 months.  Preventive Care for Adults A healthy lifestyle and preventive care can promote health and wellness. Preventive health guidelines for men include the following key practices:  A routine yearly physical is a good way to check with your health care provider about your health and preventative screening. It is a chance to share any concerns and updates on your health and to receive a thorough exam.  Visit your dentist for a routine exam and preventative care every 6 months. Brush your teeth twice a day and floss once a day. Good oral hygiene prevents tooth decay and gum disease.  The frequency of eye exams is based on your age, health, family medical history, use of contact lenses, and other factors. Follow your health care provider's recommendations for frequency of eye exams.  Eat a healthy diet. Foods such as vegetables, fruits, whole grains, low-fat dairy products, and lean protein foods contain the nutrients you need without too many calories. Decrease your intake of foods high in solid fats, added sugars, and salt. Eat the right amount of calories for you.Get information about a proper diet from your health care provider, if necessary.  Regular physical exercise is one of the most important things you can do for your health. Most adults should get at least 150 minutes of moderate-intensity exercise (any activity that increases your heart rate and causes you to sweat) each week. In addition, most adults need muscle-strengthening exercises on 2 or more days a week.  Maintain a healthy weight. The body mass index (BMI) is a screening tool to identify possible weight problems. It provides an estimate of body fat based on height and weight. Your health care provider  can find your BMI and can help you achieve or maintain a healthy weight.For adults 20 years and older:  A BMI below 18.5 is considered underweight.  A BMI of 18.5 to 24.9 is normal.  A BMI of 25 to 29.9 is considered overweight.  A BMI of 30 and above is considered obese.  Maintain normal blood lipids and cholesterol levels by exercising and minimizing your intake of saturated fat. Eat a balanced diet with plenty of fruit and vegetables. Blood tests for lipids and cholesterol should begin at age 27 and be repeated every 5 years. If your lipid or cholesterol levels are high, you are over 50, or you are at high risk for heart disease, you may need your cholesterol levels checked more frequently.Ongoing high lipid and cholesterol levels should be treated with medicines if diet and exercise are not working.  If you smoke, find out from your health care provider how to quit. If you do not use tobacco, do not start.  Lung cancer screening is recommended for adults aged 47-80 years who are at high risk for developing lung cancer because of a history of smoking. A yearly low-dose CT scan of the lungs is recommended for people who have at least a 30-pack-year history of smoking and are a current smoker or have quit within the past 15 years. A pack year of smoking is smoking an average of 1 pack of cigarettes a day for 1 year (for example: 1 pack a day for 30 years or 2 packs a day for 15 years). Yearly screening should continue until the smoker  has stopped smoking for at least 15 years. Yearly screening should be stopped for people who develop a health problem that would prevent them from having lung cancer treatment.  If you choose to drink alcohol, do not have more than 2 drinks per day. One drink is considered to be 12 ounces (355 mL) of beer, 5 ounces (148 mL) of wine, or 1.5 ounces (44 mL) of liquor.  Avoid use of street drugs. Do not share needles with anyone. Ask for help if you need support or  instructions about stopping the use of drugs.  High blood pressure causes heart disease and increases the risk of stroke. Your blood pressure should be checked at least every 1-2 years. Ongoing high blood pressure should be treated with medicines, if weight loss and exercise are not effective.  If you are 26-10 years old, ask your health care provider if you should take aspirin to prevent heart disease.  Diabetes screening involves taking a blood sample to check your fasting blood sugar level. This should be done once every 3 years, after age 66, if you are within normal weight and without risk factors for diabetes. Testing should be considered at a younger age or be carried out more frequently if you are overweight and have at least 1 risk factor for diabetes.  Colorectal cancer can be detected and often prevented. Most routine colorectal cancer screening begins at the age of 34 and continues through age 37. However, your health care provider may recommend screening at an earlier age if you have risk factors for colon cancer. On a yearly basis, your health care provider may provide home test kits to check for hidden blood in the stool. Use of a small camera at the end of a tube to directly examine the colon (sigmoidoscopy or colonoscopy) can detect the earliest forms of colorectal cancer. Talk to your health care provider about this at age 53, when routine screening begins. Direct exam of the colon should be repeated every 5-10 years through age 35, unless early forms of precancerous polyps or small growths are found.  People who are at an increased risk for hepatitis B should be screened for this virus. You are considered at high risk for hepatitis B if:  You were born in a country where hepatitis B occurs often. Talk with your health care provider about which countries are considered high risk.  Your parents were born in a high-risk country and you have not received a shot to protect against  hepatitis B (hepatitis B vaccine).  You have HIV or AIDS.  You use needles to inject street drugs.  You live with, or have sex with, someone who has hepatitis B.  You are a man who has sex with other men (MSM).  You get hemodialysis treatment.  You take certain medicines for conditions such as cancer, organ transplantation, and autoimmune conditions.  Hepatitis C blood testing is recommended for all people born from [birth year] through 1965 and any individual with known risks for hepatitis C.  Practice safe sex. Use condoms and avoid high-risk sexual practices to reduce the spread of sexually transmitted infections (STIs). STIs include gonorrhea, chlamydia, syphilis, trichomonas, herpes, HPV, and human immunodeficiency virus (HIV). Herpes, HIV, and HPV are viral illnesses that have no cure. They can result in disability, cancer, and death.  If you are at risk of being infected with HIV, it is recommended that you take a prescription medicine daily to prevent HIV infection. This is called preexposure prophylaxis (  PrEP). You are considered at risk if:  You are a man who has sex with other men (MSM) and have other risk factors.  You are a heterosexual man, are sexually active, and are at increased risk for HIV infection.  You take drugs by injection.  You are sexually active with a partner who has HIV.  Talk with your health care provider about whether you are at high risk of being infected with HIV. If you choose to begin PrEP, you should first be tested for HIV. You should then be tested every 3 months for as long as you are taking PrEP.  A one-time screening for abdominal aortic aneurysm (AAA) and surgical repair of large AAAs by ultrasound are recommended for men ages 3 to 20 years who are current or former smokers.  Healthy men should no longer receive prostate-specific antigen (PSA) blood tests as part of routine cancer screening. Talk with your health care provider about prostate cancer  screening.  Testicular cancer screening is not recommended for adult males who have no symptoms. Screening includes self-exam, a health care provider exam, and other screening tests. Consult with your health care provider about any symptoms you have or any concerns you have about testicular cancer.  Use sunscreen. Apply sunscreen liberally and repeatedly throughout the day. You should seek shade when your shadow is shorter than you. Protect yourself by wearing long sleeves, pants, a wide-brimmed hat, and sunglasses year round, whenever you are outdoors.  Once a month, do a whole-body skin exam, using a mirror to look at the skin on your back. Tell your health care provider about new moles, moles that have irregular borders, moles that are larger than a pencil eraser, or moles that have changed in shape or color.  Stay current with required vaccines (immunizations).  Influenza vaccine. All adults should be immunized every year.  Tetanus, diphtheria, and acellular pertussis (Td, Tdap) vaccine. An adult who has not previously received Tdap or who does not know his vaccine status should receive 1 dose of Tdap. This initial dose should be followed by tetanus and diphtheria toxoids (Td) booster doses every 10 years. Adults with an unknown or incomplete history of completing a 3-dose immunization series with Td-containing vaccines should begin or complete a primary immunization series including a Tdap dose. Adults should receive a Td booster every 10 years.  Varicella vaccine. An adult without evidence of immunity to varicella should receive 2 doses or a second dose if he has previously received 1 dose.  Human papillomavirus (HPV) vaccine. Males aged 20-21 years who have not received the vaccine previously should receive the 3-dose series. Males aged 22-26 years may be immunized. Immunization is recommended through the age of 79 years for any male who has sex with males and did not get any or all doses  earlier. Immunization is recommended for any person with an immunocompromised condition through the age of 62 years if he did not get any or all doses earlier. During the 3-dose series, the second dose should be obtained 4-8 weeks after the first dose. The third dose should be obtained 24 weeks after the first dose and 16 weeks after the second dose.  Zoster vaccine. One dose is recommended for adults aged 17 years or older unless certain conditions are present.  Measles, mumps, and rubella (MMR) vaccine. Adults born before [birth year] generally are considered immune to measles and mumps. Adults born in [birth year] or later should have 1 or more doses of MMR vaccine unless  there is a contraindication to the vaccine or there is laboratory evidence of immunity to each of the three diseases. A routine second dose of MMR vaccine should be obtained at least 28 days after the first dose for students attending postsecondary schools, health care workers, or international travelers. People who received inactivated measles vaccine or an unknown type of measles vaccine during 1963-1967 should receive 2 doses of MMR vaccine. People who received inactivated mumps vaccine or an unknown type of mumps vaccine before 1979 and are at high risk for mumps infection should consider immunization with 2 doses of MMR vaccine. Unvaccinated health care workers born before [birth year] who lack laboratory evidence of measles, mumps, or rubella immunity or laboratory confirmation of disease should consider measles and mumps immunization with 2 doses of MMR vaccine or rubella immunization with 1 dose of MMR vaccine.  Pneumococcal 13-valent conjugate (PCV13) vaccine. When indicated, a person who is uncertain of his immunization history and has no record of immunization should receive the PCV13 vaccine. An adult aged 66 years or older who has certain medical conditions and has not been previously immunized should receive 1 dose of PCV13 vaccine. This PCV13  should be followed with a dose of pneumococcal polysaccharide (PPSV23) vaccine. The PPSV23 vaccine dose should be obtained at least 8 weeks after the dose of PCV13 vaccine. An adult aged 49 years or older who has certain medical conditions and previously received 1 or more doses of PPSV23 vaccine should receive 1 dose of PCV13. The PCV13 vaccine dose should be obtained 1 or more years after the last PPSV23 vaccine dose.  Pneumococcal polysaccharide (PPSV23) vaccine. When PCV13 is also indicated, PCV13 should be obtained first. All adults aged 76 years and older should be immunized. An adult younger than age 50 years who has certain medical conditions should be immunized. Any person who resides in a nursing home or long-term care facility should be immunized. An adult smoker should be immunized. People with an immunocompromised condition and certain other conditions should receive both PCV13 and PPSV23 vaccines. People with human immunodeficiency virus (HIV) infection should be immunized as soon as possible after diagnosis. Immunization during chemotherapy or radiation therapy should be avoided. Routine use of PPSV23 vaccine is not recommended for American Indians, [place]atives, or people younger than 65 years unless there are medical conditions that require PPSV23 vaccine. When indicated, people who have unknown immunization and have no record of immunization should receive PPSV23 vaccine. One-time revaccination 5 years after the first dose of PPSV23 is recommended for people aged 19-64 years who have chronic kidney failure, nephrotic syndrome, asplenia, or immunocompromised conditions. People who received 1-2 doses of PPSV23 before age 70 years should receive another dose of PPSV23 vaccine at age 41 years or later if at least 5 years have passed since the previous dose. Doses of PPSV23 are not needed for people immunized with PPSV23 at or after age 54 years.  Meningococcal vaccine. Adults with asplenia or  persistent complement component deficiencies should receive 2 doses of quadrivalent meningococcal conjugate (MenACWY-D) vaccine. The doses should be obtained at least 2 months apart. Microbiologists working with certain meningococcal bacteria, [employer] recruits, people at risk during an outbreak, and people who travel to or live in countries with a high rate of meningitis should be immunized. A first-year college student up through age 40 years who is living in a residence hall should receive a dose if he did not receive a dose on or after his 16th birthday. Adults  who have certain high-risk conditions should receive one or more doses of vaccine.  Hepatitis A vaccine. Adults who wish to be protected from this disease, have certain high-risk conditions, work with hepatitis A-infected animals, work in hepatitis A research labs, or travel to or work in countries with a high rate of hepatitis A should be immunized. Adults who were previously unvaccinated and who anticipate close contact with an international adoptee during the first 60 days after arrival in the [place] from a country with a high rate of hepatitis A should be immunized.  Hepatitis B vaccine. Adults should be immunized if they wish to be protected from this disease, have certain high-risk conditions, may be exposed to blood or other infectious body fluids, are household contacts or sex partners of hepatitis B positive people, are clients or workers in certain care facilities, or travel to or work in countries with a high rate of hepatitis B.  Haemophilus influenzae type b (Hib) vaccine. A previously unvaccinated person with asplenia or sickle cell disease or having a scheduled splenectomy should receive 1 dose of Hib vaccine. Regardless of previous immunization, a recipient of a hematopoietic stem cell transplant should receive a 3-dose series 6-12 months after his successful transplant. Hib vaccine is not recommended for adults with HIV  infection. Preventive Service / Frequency Ages 78 to 30  Blood pressure check.** / Every 1 to 2 years.  Lipid and cholesterol check.** / Every 5 years beginning at age 30.  Hepatitis C blood test.** / For any individual with known risks for hepatitis C.  Skin self-exam. / Monthly.  Influenza vaccine. / Every year.  Tetanus, diphtheria, and acellular pertussis (Tdap, Td) vaccine.** / Consult your health care provider. 1 dose of Td every 10 years.  Varicella vaccine.** / Consult your health care provider.  HPV vaccine. / 3 doses over 6 months, if 34 or younger.  Measles, mumps, rubella (MMR) vaccine.** / You need at least 1 dose of MMR if you were born in 1957 or later. You may also need a second dose.  Pneumococcal 13-valent conjugate (PCV13) vaccine.** / Consult your health care provider.  Pneumococcal polysaccharide (PPSV23) vaccine.** / 1 to 2 doses if you smoke cigarettes or if you have certain conditions.  Meningococcal vaccine.** / 1 dose if you are age 36 to 67 years and a Market researcher living in a residence hall, or have one of several medical conditions. You may also need additional booster doses.  Hepatitis A vaccine.** / Consult your health care provider.  Hepatitis B vaccine.** / Consult your health care provider.  Haemophilus influenzae type b (Hib) vaccine.** / Consult your health care provider. Ages 34 to 32  Blood pressure check.** / Every 1 to 2 years.  Lipid and cholesterol check.** / Every 5 years beginning at age 74.  Lung cancer screening. / Every year if you are aged 26-80 years and have a 30-pack-year history of smoking and currently smoke or have quit within the past 15 years. Yearly screening is stopped once you have quit smoking for at least 15 years or develop a health problem that would prevent you from having lung cancer treatment.  Fecal occult blood test (FOBT) of stool. / Every year beginning at age 90 and continuing until age 18.  You may not have to do this test if you get a colonoscopy every 10 years.  Flexible sigmoidoscopy** or colonoscopy.** / Every 5 years for a flexible sigmoidoscopy or every 10 years for a colonoscopy  beginning at age 61 and continuing until age 71.  Hepatitis C blood test.** / For all people born from [birth year] through 1965 and any individual with known risks for hepatitis C.  Skin self-exam. / Monthly.  Influenza vaccine. / Every year.  Tetanus, diphtheria, and acellular pertussis (Tdap/Td) vaccine.** / Consult your health care provider. 1 dose of Td every 10 years.  Varicella vaccine.** / Consult your health care provider.  Zoster vaccine.** / 1 dose for adults aged 32 years or older.  Measles, mumps, rubella (MMR) vaccine.** / You need at least 1 dose of MMR if you were born in 1957 or later. You may also need a second dose.  Pneumococcal 13-valent conjugate (PCV13) vaccine.** / Consult your health care provider.  Pneumococcal polysaccharide (PPSV23) vaccine.** / 1 to 2 doses if you smoke cigarettes or if you have certain conditions.  Meningococcal vaccine.** / Consult your health care provider.  Hepatitis A vaccine.** / Consult your health care provider.  Hepatitis B vaccine.** / Consult your health care provider.  Haemophilus influenzae type b (Hib) vaccine.** / Consult your health care provider. Ages 68 and over  Blood pressure check.** / Every 1 to 2 years.  Lipid and cholesterol check.**/ Every 5 years beginning at age 13.  Lung cancer screening. / Every year if you are aged 22-80 years and have a 30-pack-year history of smoking and currently smoke or have quit within the past 15 years. Yearly screening is stopped once you have quit smoking for at least 15 years or develop a health problem that would prevent you from having lung cancer treatment.  Fecal occult blood test (FOBT) of stool. / Every year beginning at age 20 and continuing until age 68. You may not have to do this  test if you get a colonoscopy every 10 years.  Flexible sigmoidoscopy** or colonoscopy.** / Every 5 years for a flexible sigmoidoscopy or every 10 years for a colonoscopy beginning at age 16 and continuing until age 75.  Hepatitis C blood test.** / For all people born from [birth year] through 1965 and any individual with known risks for hepatitis C.  Abdominal aortic aneurysm (AAA) screening.** / A one-time screening for ages 69 to 21 years who are current or former smokers.  Skin self-exam. / Monthly.  Influenza vaccine. / Every year.  Tetanus, diphtheria, and acellular pertussis (Tdap/Td) vaccine.** / 1 dose of Td every 10 years.  Varicella vaccine.** / Consult your health care provider.  Zoster vaccine.** / 1 dose for adults aged 3 years or older.  Pneumococcal 13-valent conjugate (PCV13) vaccine.** / Consult your health care provider.  Pneumococcal polysaccharide (PPSV23) vaccine.** / 1 dose for all adults aged 58 years and older.  Meningococcal vaccine.** / Consult your health care provider.  Hepatitis A vaccine.** / Consult your health care provider.  Hepatitis B vaccine.** / Consult your health care provider.  Haemophilus influenzae type b (Hib) vaccine.** / Consult your health care provider. **Family history and personal history of risk and conditions may change your health care provider's recommendations. Document Released: 06/01/2001 Document Revised: 04/10/2013 Document Reviewed: 08/31/2010 [employer] Patient Information 2015 [place], [place]. This information is not intended to replace advice given to you by your health care provider. Make sure you discuss any questions you have with your health care provider.

## 2015-01-21 NOTE — Progress Notes (Signed)
Pre visit review using our clinic review tool, if applicable. No additional management support is needed unless otherwise documented below in the visit note/SLS  

## 2015-01-22 LAB — BASIC METABOLIC PANEL
BUN: 18 mg/dL (ref 6–23)
CO2: 30 mEq/L (ref 19–32)
CREATININE: 0.98 mg/dL (ref 0.40–1.50)
Calcium: 9.4 mg/dL (ref 8.4–10.5)
Chloride: 101 mEq/L (ref 96–112)
GFR: 79.49 mL/min (ref >60.00)
Glucose, Bld: 106 mg/dL — ABNORMAL HIGH (ref 70–99)
Potassium: 4.7 mEq/L (ref 3.5–5.1)
Sodium: 141 mEq/L (ref 135–145)

## 2015-01-22 LAB — HEPATIC FUNCTION PANEL
ALT: 12 U/L (ref 0–53)
AST: 18 U/L (ref 0–37)
Albumin: 4 g/dL (ref 3.5–5.2)
Alkaline Phosphatase: 105 U/L (ref 39–117)
BILIRUBIN DIRECT: 0.1 mg/dL (ref 0.0–0.3)
BILIRUBIN TOTAL: 0.4 mg/dL (ref 0.2–1.2)
Total Protein: 6.9 g/dL (ref 6.0–8.3)

## 2015-01-22 LAB — CBC
HCT: 41.8 % (ref 39.0–52.0)
Hemoglobin: 13.7 g/dL (ref 13.0–17.0)
MCHC: 32.8 g/dL (ref 30.0–36.0)
MCV: 92.5 fl (ref 78.0–100.0)
Platelets: 259 10*3/uL (ref 150.0–400.0)
RBC: 4.52 Mil/uL (ref 4.22–5.81)
RDW: 13.9 % (ref 11.5–15.5)
WBC: 9.1 10*3/uL (ref 4.0–10.5)

## 2015-01-22 LAB — URINALYSIS, ROUTINE W REFLEX MICROSCOPIC
BILIRUBIN URINE: NEGATIVE
HGB URINE DIPSTICK: NEGATIVE
KETONES UR: NEGATIVE
LEUKOCYTES UA: NEGATIVE
NITRITE: NEGATIVE
PH: 5.5 (ref 5.0–8.0)
RBC / HPF: NONE SEEN (ref 0–?)
Specific Gravity, Urine: 1.015 (ref 1.000–1.030)
Total Protein, Urine: NEGATIVE
UROBILINOGEN UA: 0.2 (ref 0.0–1.0)
Urine Glucose: NEGATIVE
WBC UA: NONE SEEN (ref 0–?)

## 2015-01-22 LAB — LIPID PANEL
CHOL/HDL RATIO: 5
CHOLESTEROL: 207 mg/dL — AB (ref 0–200)
HDL: 40.5 mg/dL (ref >39.00)
NonHDL: 166.97
Triglycerides: 217 mg/dL — ABNORMAL HIGH (ref 0.0–149.0)
VLDL: 43.4 mg/dL — ABNORMAL HIGH (ref 0.0–40.0)

## 2015-01-22 LAB — PSA: PSA: 1.18 ng/mL (ref 0.10–4.00)

## 2015-01-22 LAB — LDL CHOLESTEROL, DIRECT: Direct LDL: 139 mg/dL

## 2015-01-27 ENCOUNTER — Telehealth: Payer: Self-pay

## 2015-01-27 NOTE — Telephone Encounter (Signed)
Pt is taking the Niacin daily. Please advise if you need to make any further changes to current medications.

## 2015-01-28 NOTE — Telephone Encounter (Signed)
See result note.  

## 2015-01-29 ENCOUNTER — Encounter: Payer: Self-pay | Admitting: *Deleted

## 2015-01-29 DIAGNOSIS — Z23 Encounter for immunization: Secondary | ICD-10-CM | POA: Insufficient documentation

## 2015-01-29 DIAGNOSIS — Z0000 Encounter for general adult medical examination without abnormal findings: Secondary | ICD-10-CM | POA: Insufficient documentation

## 2015-01-29 DIAGNOSIS — Z136 Encounter for screening for cardiovascular disorders: Secondary | ICD-10-CM | POA: Insufficient documentation

## 2015-01-29 NOTE — Assessment & Plan Note (Signed)
Followed by Cardiology. Vitals stable. Continue Lasix. Will check BMP today. (

## 2015-01-29 NOTE — Assessment & Plan Note (Signed)
EKG reveals NSR.  Will obtain lipid panel today. 

## 2015-01-29 NOTE — Assessment & Plan Note (Signed)
During the course of the visit the patient was educated and counseled about appropriate screening and preventive services including:    Pneumococcal vaccine   Influenza vaccine  Screening electrocardiogram  Prostate cancer screening  Colorectal cancer screening  Nutrition counseling   Advanced directives: has an advanced directive - a copy HAS NOT been provided.  Will obtain fasting labs today.

## 2015-01-29 NOTE — Assessment & Plan Note (Signed)
Prevnar and Flu shot given today by nursing staff.

## 2015-01-29 NOTE — Assessment & Plan Note (Signed)
Mild. Does not wish to start medications. Will check glucose level today on routine labs.

## 2015-01-29 NOTE — Assessment & Plan Note (Signed)
Prevnar and Flu shot given today by nursing staff. 

## 2015-03-18 ENCOUNTER — Other Ambulatory Visit: Payer: Self-pay | Admitting: Physician Assistant

## 2015-04-08 ENCOUNTER — Telehealth: Payer: Self-pay | Admitting: Physician Assistant

## 2015-04-08 ENCOUNTER — Telehealth: Payer: Self-pay | Admitting: Internal Medicine

## 2015-04-08 NOTE — Telephone Encounter (Signed)
Follow up call made to patient. Patient states he currently has stopped bleeding rectally. States he has no other symptoms at this time. States he has an appointment scheduled for tomorrow with Enid Skeens. Martin. Advised patient if bleeding started again or if he had any other symptoms to please go straight to ED for evaluation. Patient agreed.

## 2015-04-08 NOTE — Telephone Encounter (Signed)
Pt is scheduled for appt 12/21 11:00am (30 min) for rectal bleeding. Pt states Dr. Lamar SprinklesPerry's office called and told them whatever the recommendation of PCP is what pt should do. [facility]

## 2015-04-08 NOTE — Telephone Encounter (Signed)
Patient Name: Sanjuana KavaRICHARD Goldammer  DOB: 1940-04-26    Initial Comment Caller states he has severe rectal bleeding, concerned about burst internal hemorrhoid    Nurse Assessment  Nurse: Scarlette ArStandifer, RN, Heather Date/Time (Eastern Time): 04/08/2015 10:46:49 AM  Confirm and document reason for call. If symptomatic, describe symptoms. ---Caller states he has severe rectal bleeding that started 2 days ago, concerned about burst internal hemorrhoid  Has the patient traveled out of the country within the last 30 days? ---Not Applicable  Does the patient have any new or worsening symptoms? ---Yes  Will a triage be completed? ---Yes  Related visit to physician within the last 2 weeks? ---No  Does the PT have any chronic conditions? (i.e. diabetes, asthma, etc.) ---Yes  List chronic conditions. ---see MR  Is this a behavioral health or substance abuse call? ---No     Guidelines    Guideline Title Affirmed Question Affirmed Notes  Rectal Bleeding MODERATE rectal bleeding (small blood clots, passing blood without stool, or toilet water turns red)    Final Disposition User   See Physician within 24 Hours Standifer, RN, Insurance underwriterHeather    Comments  Caller states that he does not want to wait until tomorrow for an appt. He wants to see if he can get an appt with Selena Battenody today. Please call him ASAP to let him know.   Referrals  REFERRED TO PCP OFFICE   Disagree/Comply: Comply

## 2015-04-08 NOTE — Telephone Encounter (Signed)
Will see at visit.

## 2015-04-08 NOTE — Telephone Encounter (Signed)
error:[id number] ° °

## 2015-04-08 NOTE — Telephone Encounter (Signed)
Pt states he is passing quite a bit of BRB. Pt thinks it is from an internal hemorrhoid.States he has an appt tomorrow with PCP but he feels sure they will refer him to GI. States he would like to see Dr. Marina GoodellPerry, states his wife sees Dr. Marina GoodellPerry. Discussed with pt that once we get the referral we can schedule him an appt but that if he was bleeding a large amt, develped SOB, weakness or dizziness that he should go to the ER. Pt states he knows this.

## 2015-04-09 ENCOUNTER — Ambulatory Visit (INDEPENDENT_AMBULATORY_CARE_PROVIDER_SITE_OTHER): Payer: Medicare Other | Admitting: Physician Assistant

## 2015-04-09 ENCOUNTER — Encounter: Payer: Self-pay | Admitting: Physician Assistant

## 2015-04-09 VITALS — BP 130/70 | HR 71 | Temp 97.8°F | Ht 66.0 in | Wt 212.6 lb

## 2015-04-09 DIAGNOSIS — K648 Other hemorrhoids: Secondary | ICD-10-CM | POA: Insufficient documentation

## 2015-04-09 LAB — BASIC METABOLIC PANEL
BUN: 30 mg/dL — ABNORMAL HIGH (ref 6–23)
CO2: 28 mEq/L (ref 19–32)
Calcium: 9.4 mg/dL (ref 8.4–10.5)
Chloride: 103 mEq/L (ref 96–112)
Creatinine, Ser: 1.01 mg/dL (ref 0.40–1.50)
GFR: 76.73 mL/min (ref >60.00)
Glucose, Bld: 103 mg/dL — ABNORMAL HIGH (ref 70–99)
Potassium: 4.1 mEq/L (ref 3.5–5.1)
Sodium: 139 mEq/L (ref 135–145)

## 2015-04-09 MED ORDER — HYDROCORTISONE ACETATE 25 MG RE SUPP: 25.0000 mg | Freq: Two times a day (BID) | RECTAL | Status: DC

## 2015-04-09 NOTE — Progress Notes (Signed)
Patient presents to clinic today c/o intermittent rectal bleeding over the past few months with history of external hemorrhoids. Now over the past 3 days has noticed increased BRBPR. Endorses bright bloodI in the toilet water. Denies tenesmus or melena. Denies change to bowel habits. Denies lightheadedness, dizziness or fatigue.  Past Medical History  Diagnosis Date  . BPH (benign prostatic hyperplasia)     takes Flomax daily  . Arthritis     spine  . Gout, arthritis     feet  . Hard of hearing     does not wear hearing aides  . Depression     takes Zoloft daily  . GERD (gastroesophageal reflux disease)     takes Omeprazole daily  . Hyperlipidemia     takes Niacin daily  . Peripheral edema     takes lasix daily  . Shortness of breath     with exertion  . Pneumonia 2015  . History of bronchitis     "long time ago"  . Migraine     r/t neck issues  . Loss of balance     related to back   . Numbness     hands and related to neck  . Joint pain   . Chronic back pain     stenosis  . Gout     takes Allouprinol and Colchicine daily  . Diverticulosis   . Cataract     immature left eye    Current Outpatient Prescriptions on File Prior to Visit  Medication Sig Dispense Refill  . colchicine 0.6 MG tablet Take 1 tablet by mouth as needed.    . cyclobenzaprine (FLEXERIL) 10 MG tablet Take 10 mg by mouth 3 (three) times daily as needed for muscle spasms.    . febuxostat (ULORIC) 40 MG tablet Take 40 mg by mouth as needed. Takes only as needed    . furosemide (LASIX) 40 MG tablet Take 1 tablet (40 mg total) by mouth 2 (two) times daily. (Patient taking differently: Take 40 mg by mouth daily. ) 180 tablet 1  . Multiple Vitamins-Minerals (CENTRUM SILVER ADULT 50+ PO) Take 1 tablet by mouth daily.    . niacin 500 MG tablet Take 500 mg by mouth at bedtime.    . Omega-3 Fatty Acids (FISH OIL) 1000 MG CAPS Take 2,100 mg by mouth daily.     Marland Kitchen. omeprazole (PRILOSEC OTC) 20 MG tablet Take  20 mg by mouth daily.    Bertram Gala. Polyethyl Glycol-Propyl Glycol (SYSTANE OP) Place 2 drops into both eyes at bedtime.    . potassium chloride (MICRO-K) 10 MEQ CR capsule Take 1 capsule (10 mEq total) by mouth daily. 90 capsule 1  . Probiotic Product (PROBIOTIC PO) Take 1 capsule by mouth daily.    . sertraline (ZOLOFT) 25 MG tablet TAKE 1 TABLET BY MOUTH ONCE DAILY 90 tablet 1  . tamsulosin (FLOMAX) 0.4 MG CAPS capsule Take 2 capsules (0.8 mg total) by mouth daily. 180 capsule 1   No current facility-administered medications on file prior to visit.    Allergies  Allergen Reactions  . Cephalosporins Other (See Comments)    Oral thrush  . Hydromorphone Itching  . Oxycodone Itching and Nausea Only    No family history on file.  Social History   Social History  . Marital Status: Married    Spouse Name: N/A  . Number of Children: N/A  . Years of Education: N/A   Occupational History  . Retired  Social History Main Topics  . Smoking status: Former Smoker -- 1.50 packs/day for 55 years    Types: Cigarettes  . Smokeless tobacco: Current User  . Alcohol Use: No  . Drug Use: No  . Sexual Activity: Not Currently   Other Topics Concern  . None   Social History Narrative   Review of Systems - See HPI.  All other ROS are negative.  BP 130/70 mmHg  Pulse 71  Temp(Src) 97.8 F (36.6 C) (Oral)  Ht  (1.676 m)  Wt 212 lb 9.6 oz (96.435 kg)  BMI 34.33 kg/m2  SpO2 94%  Physical Exam  Constitutional: He is oriented to person, place, and time.  Cardiovascular: Normal rate, regular rhythm, normal heart sounds and intact distal pulses.   Pulmonary/Chest: Effort normal.  Genitourinary: Rectal exam shows internal hemorrhoid and fissure. Rectal exam shows no tenderness. Guaiac positive stool.  Neurological: He is alert and oriented to person, place, and time.  Skin: Skin is warm and dry. No rash noted.  Psychiatric: Affect normal.  Vitals reviewed.   Recent Results (from the  past 2160 hour(s))  CBC     Status: None   Collection Time: 01/21/15  2:37 PM  Result Value Ref Range   WBC 9.1 4.0 - 10.5 K/uL   RBC 4.52 4.22 - 5.81 Mil/uL   Platelets 259.0 150.0 - 400.0 K/uL   Hemoglobin 13.7 13.0 - 17.0 g/dL   HCT 16.1 09.6 - 04.5 %   MCV 92.5 78.0 - 100.0 fl   MCHC 32.8 30.0 - 36.0 g/dL   RDW 40.9 81.1 - 91.4 %  Basic Metabolic Panel (BMET)     Status: Abnormal   Collection Time: 01/21/15  2:37 PM  Result Value Ref Range   Sodium 141 135 - 145 mEq/L   Potassium 4.7 3.5 - 5.1 mEq/L   Chloride 101 96 - 112 mEq/L   CO2 30 19 - 32 mEq/L   Glucose, Bld 106 (H) 70 - 99 mg/dL   BUN 18 6 - 23 mg/dL   Creatinine, Ser 7.82 0.40 - 1.50 mg/dL   Calcium 9.4 8.4 - 95.6 mg/dL   GFR 21.30 >86.57 mL/min  Hepatic function panel     Status: None   Collection Time: 01/21/15  2:37 PM  Result Value Ref Range   Total Bilirubin 0.4 0.2 - 1.2 mg/dL   Bilirubin, Direct 0.1 0.0 - 0.3 mg/dL   Alkaline Phosphatase 105 39 - 117 U/L   AST 18 0 - 37 U/L   ALT 12 0 - 53 U/L   Total Protein 6.9 6.0 - 8.3 g/dL   Albumin 4.0 3.5 - 5.2 g/dL  Urinalysis, Routine w reflex microscopic (not at [facility])     Status: None   Collection Time: 01/21/15  2:37 PM  Result Value Ref Range   Color, Urine YELLOW Yellow;Lt. Yellow   APPearance CLEAR Clear   Specific Gravity, Urine 1.015 1.000-1.030   pH 5.5 5.0 - 8.0   Total Protein, Urine NEGATIVE Negative   Urine Glucose NEGATIVE Negative   Ketones, ur NEGATIVE Negative   Bilirubin Urine NEGATIVE Negative   Hgb urine dipstick NEGATIVE Negative   Urobilinogen, UA 0.2 0.0 - 1.0   Leukocytes, UA NEGATIVE Negative   Nitrite NEGATIVE Negative   WBC, UA none seen 0-2/hpf   RBC / HPF none seen 0-2/hpf  PSA     Status: None   Collection Time: 01/21/15  2:37 PM  Result Value Ref Range  PSA 1.18 0.10 - 4.00 ng/mL  Lipid panel     Status: Abnormal   Collection Time: 01/21/15  2:37 PM  Result Value Ref Range   Cholesterol 207 (H) 0 - 200 mg/dL     Comment: ATP III Classification       Desirable:  < 200 mg/dL               Borderline High:  200 - 239 mg/dL          High:  > = 409 mg/dL   Triglycerides 811.9 (H) 0.0 - 149.0 mg/dL    Comment: Normal:  <147 mg/dLBorderline High:  150 - 199 mg/dL   HDL 82.95 >62.13 mg/dL   VLDL 08.6 (H) 0.0 - 57.8 mg/dL   Total CHOL/HDL Ratio 5     Comment:                Men          Women1/2 Average Risk     3.4          3.3Average Risk          5.0          4.42X Average Risk          9.6          7.13X Average Risk          15.0          11.0                       NonHDL 166.97     Comment: NOTE:  Non-HDL goal should be 30 mg/dL higher than patient's LDL goal (i.e. LDL goal of < 70 mg/dL, would have non-HDL goal of < 100 mg/dL)  LDL cholesterol, direct     Status: None   Collection Time: 01/21/15  2:37 PM  Result Value Ref Range   Direct LDL 139.0 mg/dL    Comment: Optimal:  <469 mg/dLNear or Above Optimal:  100-129 mg/dLBorderline High:  130-159 mg/dLHigh:  160-189 mg/dLVery High:  >190 mg/dL   Assessment/Plan: Internal hemorrhoid, bleeding Rx Hydrocortisone-AC rectal suppositories BID x 6 days. Supportive measures reviewed. Referral to GI placed.

## 2015-04-09 NOTE — Patient Instructions (Signed)
Please stay hydrated and start a stool softener daily. Use the suppositories as directed. You will be contacted by GI for assessment .  Please go to the lab for blood work and then go downstairs to schedule CT scan.  Hemorrhoids Hemorrhoids are puffy (swollen) veins around the rectum or anus. Hemorrhoids can cause pain, itching, bleeding, or irritation. HOME CARE  Eat foods with fiber, such as whole grains, beans, nuts, fruits, and vegetables. Ask your doctor about taking products with added fiber in them (fibersupplements).  Drink enough fluid to keep your pee (urine) clear or pale yellow.  Exercise often.  Go to the bathroom when you have the urge to poop. Do not wait.  Avoid straining to poop (bowel movement).  Keep the butt area dry and clean. Use wet toilet paper or moist paper towels.  Medicated creams and medicine inserted into the anus (anal suppository) may be used or applied as told.  Only take medicine as told by your doctor.  Take a warm water bath (sitz bath) for 15-20 minutes to ease pain. Do this 3-4 times a day.  Place ice packs on the area if it is tender or puffy. Use the ice packs between the warm water baths.  Put ice in a plastic bag.  Place a towel between your skin and the bag.  Leave the ice on for 15-20 minutes, 03-04 times a day.  Do not use a donut-shaped pillow or sit on the toilet for a long time. GET HELP RIGHT AWAY IF:   You have more pain that is not controlled by treatment or medicine.  You have bleeding that will not stop.  You have trouble or are unable to poop (bowel movement).  You have pain or puffiness outside the area of the hemorrhoids. MAKE SURE YOU:   Understand these instructions.  Will watch your condition.  Will get help right away if you are not doing well or get worse.   This information is not intended to replace advice given to you by your health care provider. Make sure you discuss any questions you have with  your health care provider.   Document Released: 01/13/2008 Document Revised: 03/22/2012 Document Reviewed: 02/15/2012 Elsevier Interactive Patient Education [employer]2016 [employer] Inc.

## 2015-04-09 NOTE — Assessment & Plan Note (Signed)
Rx Hydrocortisone-AC rectal suppositories BID x 6 days. Supportive measures reviewed. Referral to GI placed.

## 2015-04-09 NOTE — Progress Notes (Signed)
Pre visit review using our clinic review tool, if applicable. No additional management support is needed unless otherwise documented below in the visit note. 

## 2015-04-15 ENCOUNTER — Encounter (HOSPITAL_BASED_OUTPATIENT_CLINIC_OR_DEPARTMENT_OTHER): Payer: Self-pay

## 2015-04-15 ENCOUNTER — Ambulatory Visit (HOSPITAL_BASED_OUTPATIENT_CLINIC_OR_DEPARTMENT_OTHER)
Admission: RE | Admit: 2015-04-15 | Discharge: 2015-04-15 | Disposition: A | Discharge status: Home / Self Care | Payer: Medicare Other | Source: Ambulatory Visit | Attending: Physician Assistant | Admitting: Physician Assistant

## 2015-04-15 DIAGNOSIS — R1033 Periumbilical pain: Secondary | ICD-10-CM | POA: Insufficient documentation

## 2015-04-15 DIAGNOSIS — I2510 Atherosclerotic heart disease of native coronary artery without angina pectoris: Secondary | ICD-10-CM | POA: Insufficient documentation

## 2015-04-15 DIAGNOSIS — K5730 Diverticulosis of large intestine without perforation or abscess without bleeding: Secondary | ICD-10-CM | POA: Insufficient documentation

## 2015-04-15 DIAGNOSIS — I700 Atherosclerosis of aorta: Secondary | ICD-10-CM | POA: Diagnosis not present

## 2015-04-15 DIAGNOSIS — K460 Unspecified abdominal hernia with obstruction, without gangrene: Secondary | ICD-10-CM | POA: Insufficient documentation

## 2015-04-15 MED ORDER — IOHEXOL 300 MG/ML  SOLN
100.0000 mL | Freq: Once | INTRAMUSCULAR | Status: AC | PRN
  Administered 2015-04-15: 100 mL via INTRAVENOUS

## 2015-04-16 ENCOUNTER — Telehealth: Payer: Self-pay | Admitting: *Deleted

## 2015-04-16 NOTE — Telephone Encounter (Signed)
-----   Message from Sheliah HatchKatherine E Tabori, MD sent at 04/16/2015  8:06 AM EST ----- No acute findings on CT.  He does have diverticulosis throughout the colon but no evidence of current diverticulitis He does have coronary artery disease and aortic atherosclerosis that should be continued to be monitored by cardiology

## 2015-04-16 NOTE — Telephone Encounter (Signed)
Called and spoke with the pt and informed him of recent lab results and note.  Pt verbalized understanding.   Pt wanted to know what is the next step.  Please advise.//AB/CMA

## 2015-04-22 NOTE — Telephone Encounter (Signed)
CT is not showing a true herniation so issue is likely a weakening in the musculature that bowels are pushing on. If it is causing him issue then we can refer to specialty. If no issue then nothing further is needed.

## 2015-04-22 NOTE — Telephone Encounter (Signed)
Called and spoke with the pt and informed him of the note below.  Pt verbalized understanding.  Pt stated that his stools are still hard, and he is wanting to know where the blood is coming from.  Pt also wanted to know if the CT would show if he has cancer or not.  Please advise.//AB/CMA

## 2015-04-22 NOTE — Telephone Encounter (Signed)
Blood would be coming from the hemorrhoids he was diagnosed with and treated for. Has he heard from the specialist he was referred to?  CT would typically pick up a worrisome mass. None noted.

## 2015-04-23 NOTE — Telephone Encounter (Signed)
[facility] @ [phone]) asking the pt to RTC regarding the message below.//AB/CMA

## 2015-04-29 NOTE — Telephone Encounter (Signed)
Called and spoke with the pt and informed him of the note below.  Pt verbalized understanding.  Pt stated that he has not heard from the specialist yet.//AB/CMA

## 2015-05-12 ENCOUNTER — Ambulatory Visit: Payer: Medicare Other | Admitting: Physician Assistant

## 2015-05-21 ENCOUNTER — Other Ambulatory Visit: Payer: Self-pay | Admitting: Physician Assistant

## 2015-05-21 NOTE — Telephone Encounter (Signed)
Rx sent to the pharmacy by e-script.//AB/CMA 

## 2015-06-06 ENCOUNTER — Ambulatory Visit: Payer: Medicare Other | Admitting: Internal Medicine

## 2015-07-14 ENCOUNTER — Encounter: Payer: Self-pay | Admitting: Physician Assistant

## 2015-07-14 ENCOUNTER — Ambulatory Visit (INDEPENDENT_AMBULATORY_CARE_PROVIDER_SITE_OTHER): Payer: Medicare Other | Admitting: Physician Assistant

## 2015-07-14 VITALS — BP 150/100 | HR 62 | Temp 98.0°F | Ht 66.0 in

## 2015-07-14 DIAGNOSIS — R0602 Shortness of breath: Secondary | ICD-10-CM | POA: Diagnosis not present

## 2015-07-14 DIAGNOSIS — R251 Tremor, unspecified: Secondary | ICD-10-CM

## 2015-07-14 DIAGNOSIS — R600 Localized edema: Secondary | ICD-10-CM | POA: Diagnosis not present

## 2015-07-14 DIAGNOSIS — F430 Acute stress reaction: Secondary | ICD-10-CM | POA: Diagnosis not present

## 2015-07-14 NOTE — Progress Notes (Signed)
Pre visit review using our clinic review tool, if applicable. No additional management support is needed unless otherwise documented below in the visit note. 

## 2015-07-14 NOTE — Patient Instructions (Signed)
Please go to the lab for blood work.   I will call with results. Please continue chronic medications as directed.  You will be contacted for a repeat Echocardiogram.  You will also be contacted for assessment by Neurology. I will try to help move up your appointment with Cardiology. Please keep your phone on.  Will alter Lasix regimen once I have lab results in.  I am sorry for your loss. Just take it one day at a time. Let us know if we can be there for you.

## 2015-07-14 NOTE — Progress Notes (Signed)
Patient presents to clinic today with his wife noting multiple complaints.   Wife has noted a declines in patients strength, memory and mood since the sudden death of their son last week. They are having a hard time with stressors, currently waiting on their son's body to be autopsied before being sent to them for burial. Patient was previously ambulating well with a walker. Is now mainly in a wheelchair. Patient denies fall or injury. Denies focal weakness. Notes gradually worsening bilateral lower extremity weakness that was significantly worsened due to this recent loss. States he does not have the drive to try to walk. Patient does endorses tearfulness and depressed mood with anhedonia. Denies suicidal thoughts or intent. Patient is having a hard time understanding why his son was taken from him. States he is having a hard time with recent memory and misplacing things. Denies AMS, vision changes, facial weakness or change in speech. Has started having a tremor of hands bilaterally. Wife and patient question if he may be developing Parkinson's disease as it runs in the patients family.   Patient with history of diastolic heart failure. also notes some SOB with exertion. Denies chest pain, palpitations, lightheadedness or dizziness. Denies PND or orthopnea. Has chronic peripheral edema for which he is prescribed Lasix 40 mg BID. Is only taking once daily as he is having a harder time getting to the restroom. Denies worsening edema or change in weight.   Past Medical History  Diagnosis Date  . BPH (benign prostatic hyperplasia)     takes Flomax daily  . Arthritis     spine  . Gout, arthritis     feet  . Hard of hearing     does not wear hearing aides  . Depression     takes Zoloft daily  . GERD (gastroesophageal reflux disease)     takes Omeprazole daily  . Hyperlipidemia     takes Niacin daily  . Peripheral edema     takes lasix daily  . Shortness of breath     with exertion  .  Pneumonia 2015  . History of bronchitis     "long time ago"  . Migraine     r/t neck issues  . Loss of balance     related to back   . Numbness     hands and related to neck  . Joint pain   . Chronic back pain     stenosis  . Gout     takes Allouprinol and Colchicine daily  . Diverticulosis   . Cataract     immature left eye  . Hemorrhoids     Current Outpatient Prescriptions on File Prior to Visit  Medication Sig Dispense Refill  . colchicine 0.6 MG tablet Take 1 tablet by mouth as needed.    . cyclobenzaprine (FLEXERIL) 10 MG tablet Take 10 mg by mouth 3 (three) times daily as needed for muscle spasms.    . furosemide (LASIX) 40 MG tablet Take 1 tablet (40 mg total) by mouth 2 (two) times daily. (Patient taking differently: Take 1.5mg  tablet by mouth daily.) 180 tablet 1  . Multiple Vitamins-Minerals (CENTRUM SILVER ADULT 50+ PO) Take 1 tablet by mouth daily.    . niacin 500 MG tablet Take 500 mg by mouth at bedtime.    . Omega-3 Fatty Acids (FISH OIL) 1000 MG CAPS Take 2,100 mg by mouth daily.     Bertram Gala Glycol-Propyl Glycol (SYSTANE OP) Place 2 drops into both eyes at  bedtime.    . potassium chloride (K-DUR) 10 MEQ tablet TAKE 1 CAPSULE BY MOUTH ONCE DAILY 90 tablet 1  . Probiotic Product (PROBIOTIC PO) Take 1 capsule by mouth daily.    . sertraline (ZOLOFT) 25 MG tablet TAKE 1 TABLET BY MOUTH ONCE DAILY 90 tablet 1  . tamsulosin (FLOMAX) 0.4 MG CAPS capsule Take 2 capsules (0.8 mg total) by mouth daily. 180 capsule 1   No current facility-administered medications on file prior to visit.    Allergies  Allergen Reactions  . Cephalosporins Other (See Comments)    Oral thrush  . Hydromorphone Itching  . Oxycodone Itching and Nausea Only    No family history on file.  Social History   Social History  . Marital Status: Married    Spouse Name: N/A  . Number of Children: N/A  . Years of Education: N/A   Occupational History  . Retired    Social History Main  Topics  . Smoking status: Former Smoker -- 1.50 packs/day for 55 years    Types: Cigarettes  . Smokeless tobacco: Current User  . Alcohol Use: No  . Drug Use: No  . Sexual Activity: Not Currently   Other Topics Concern  . None   Social History Narrative   Review of Systems - See HPI.  All other ROS are negative.  BP 150/100 mmHg  Pulse 62  Temp(Src) 98 F (36.7 C) (Oral)  Ht  (1.676 m)  Wt   SpO2 96%  Physical Exam  Constitutional: He is oriented to person, place, and time and well-developed, well-nourished, and in no distress.  HENT:  Head: Normocephalic and atraumatic.  Eyes: Conjunctivae are normal. Pupils are equal, round, and reactive to light.  Neck: Neck supple.  Cardiovascular: Normal rate, regular rhythm, normal heart sounds and intact distal pulses.   Pulses:      Dorsalis pedis pulses are 2+ on the right side, and 2+ on the left side.       Posterior tibial pulses are 2+ on the right side, and 2+ on the left side.  1+ pitting edema of lower extremities bilaterally.   Pulmonary/Chest: Effort normal and breath sounds normal. No respiratory distress. He has no wheezes. He has no rales. He exhibits no tenderness.  Lymphadenopathy:    He has no cervical adenopathy.  Neurological: He is alert and oriented to person, place, and time. He has normal strength and intact cranial nerves. He displays no weakness and facial symmetry.  Skin: Skin is warm and dry. No rash noted.  Vitals reviewed.   Recent Results (from the past 2160 hour(s))  B Nat Peptide     Status: Abnormal   Collection Time: 07/14/15  2:54 PM  Result Value Ref Range   Pro B Natriuretic peptide (BNP) 134.0 (H) 0.0 - 100.0 pg/mL  Basic Metabolic Panel (BMET)     Status: None   Collection Time: 07/14/15  2:54 PM  Result Value Ref Range   Sodium 143 135 - 145 mEq/L   Potassium 3.6 3.5 - 5.1 mEq/L   Chloride 106 96 - 112 mEq/L   CO2 26 19 - 32 mEq/L   Glucose, Bld 85 70 - 99 mg/dL   BUN 21 6 - 23  mg/dL   Creatinine, Ser 4.09 0.40 - 1.50 mg/dL   Calcium 9.3 8.4 - 81.1 mg/dL   GFR 91.47 >82.95 mL/min  TSH     Status: None   Collection Time: 07/14/15  2:54 PM  Result  Value Ref Range   TSH 0.69 0.35 - 4.50 uIU/mL    Assessment/Plan: Acute stress reaction Feel this is the main contributor to endorsed memory loss, inattention and worsened weakness. MMSE performed with score of 29/30. Neurological exam reveals bilateral tremor but no other deficits or concerning findings. Patient declines increase in Sertraline or starting other medications. Discussed stages of grief with patient and his wife. What he has been experiencing is normal for a grieving father, however we want him to not stop taking care of himself. Will follow very closely Handout on counseling given. Continue Sertraline as directed. Alarm signs/symptoms reviewed that would prompt ER assessment. Will follow closely. FU 1 week.  Tremor Bilateral. Worsens with intent. There is + family history or Parkinson's disease. Hard to assess rigidity and gait as patient is currently in wheelchair. Labs obtained today. Will refer to Neurology for more in-depth assessment. For weakness, discussed that strength is good on exam. Recommend PT for gait assessment and strengthening. Also discussed the need for him to walk with his walker as he is able, just refraining from doing so at present. The longer he stays in the wheelchair, the worse things will get.   Shortness of breath on exertion Mild peripheral edema noted. Lung examination within normal limits. Will check pro-BNP today to verify no concern for start of CHF exacerbation. Will increase Lasix to 60 mg once daily as BMP reveals stable potassium. Elevate extremities. Compression stockings recommended. Order for Echo placed. Patient to schedule FU with Cardiologist.   Leg edema BMP reveals stable potassium. Patient only tolerating once daily dose of Lasix 40 mg and he does not like to take  BID. Will increase once daily dose to 60 mg. Supportive measures reviewed. FU 1 week.

## 2015-07-15 LAB — BASIC METABOLIC PANEL
BUN: 21 mg/dL (ref 6–23)
CHLORIDE: 106 meq/L (ref 96–112)
CO2: 26 meq/L (ref 19–32)
Calcium: 9.3 mg/dL (ref 8.4–10.5)
Creatinine, Ser: 0.85 mg/dL (ref 0.40–1.50)
GFR: 93.55 mL/min (ref >60.00)
Glucose, Bld: 85 mg/dL (ref 70–99)
POTASSIUM: 3.6 meq/L (ref 3.5–5.1)
Sodium: 143 mEq/L (ref 135–145)

## 2015-07-15 LAB — TSH: TSH: 0.69 u[IU]/mL (ref 0.35–4.50)

## 2015-07-15 LAB — BRAIN NATRIURETIC PEPTIDE: PRO B NATRI PEPTIDE: 134 pg/mL — AB (ref 0.0–100.0)

## 2015-07-16 ENCOUNTER — Telehealth: Payer: Self-pay | Admitting: *Deleted

## 2015-07-16 NOTE — Telephone Encounter (Signed)
-----   Message from WilliWaldon Merlam C Martin, PA-C sent at 07/15/2015 10:00 PM EDT ----- Labs good overall. Pro-BNP (marker of heart failure) only just elevated. Will increase Lasix to 60 mg once daily for leg swelling. (they only make 40 mg and 80 mg tablets I believe, so we will have him continue the 40 mg tablet and take 1.5 tablets daily). Ok to send in new Rx to reflect dose change. Will call once echocardiogram has resulted. Follow-up with me in 1 week. Have him call us if he has not heard from Neurology by Friday morning.

## 2015-07-16 NOTE — Telephone Encounter (Signed)
Called and spoke with the pt and his wife and informed them both of the pt recent lab results and note.  Both verbalized understanding and agreed.  The wife stated that the pt has already heard from Neurology and he has an appt on (08/04/15 @ 12:45pm) with Dr. Arbutus Leasat.  Pt was also scheduled a follow-up appt with Selena Battenody on (Wed-07/23/15 @ 1:15pm).//AB/CMA

## 2015-07-22 ENCOUNTER — Ambulatory Visit: Payer: Medicare Other | Admitting: Internal Medicine

## 2015-07-22 ENCOUNTER — Ambulatory Visit: Payer: Medicare Other | Admitting: Physician Assistant

## 2015-07-23 ENCOUNTER — Ambulatory Visit (INDEPENDENT_AMBULATORY_CARE_PROVIDER_SITE_OTHER): Payer: Medicare Other | Admitting: Physician Assistant

## 2015-07-23 ENCOUNTER — Encounter: Payer: Self-pay | Admitting: Physician Assistant

## 2015-07-23 VITALS — BP 140/80 | HR 70 | Temp 97.7°F

## 2015-07-23 DIAGNOSIS — R0602 Shortness of breath: Secondary | ICD-10-CM

## 2015-07-23 DIAGNOSIS — R2681 Unsteadiness on feet: Secondary | ICD-10-CM

## 2015-07-23 DIAGNOSIS — R29898 Other symptoms and signs involving the musculoskeletal system: Secondary | ICD-10-CM | POA: Diagnosis not present

## 2015-07-23 DIAGNOSIS — F430 Acute stress reaction: Secondary | ICD-10-CM

## 2015-07-23 MED ORDER — RANITIDINE HCL 150 MG PO TABS: 150.0000 mg | ORAL_TABLET | Freq: Two times a day (BID) | ORAL | Status: DC

## 2015-07-23 NOTE — Progress Notes (Signed)
Pre visit review using our clinic review tool, if applicable. No additional management support is needed unless otherwise documented below in the visit note. 

## 2015-07-23 NOTE — Progress Notes (Signed)
Patient presents to clinic today for follow-up of memory loss, tremor and peripheral edema.  Patient endorses taking his new dose of Lasix 60 mg daily as directed. Endorses improvement in peripheral edema. Denies SOB at rest, PND or Orthopnea. Has Echo scheduled for mild SOBOE.   Patient has also been scheduled to see Dr. Arbutus Leas for further assessment of tremor. Memory and focus is doing a little better per patient and wife, after they have had some closure regarding the passing of their son. States the funeral was very hard but he is doing better.  Has considered things discussed at last visit. Would like to proceed with PT for lower extremity weakness as he states he can feel his muscles getting weaker and weaker. Denies focal weakness or sensory change.   Past Medical History  Diagnosis Date  . BPH (benign prostatic hyperplasia)     takes Flomax daily  . Arthritis     spine  . Gout, arthritis     feet  . Hard of hearing     does not wear hearing aides  . Depression     takes Zoloft daily  . GERD (gastroesophageal reflux disease)     takes Omeprazole daily  . Hyperlipidemia     takes Niacin daily  . Peripheral edema     takes lasix daily  . Shortness of breath     with exertion  . Pneumonia 2015  . History of bronchitis     "long time ago"  . Migraine     r/t neck issues  . Loss of balance     related to back   . Numbness     hands and related to neck  . Joint pain   . Chronic back pain     stenosis  . Gout     takes Allouprinol and Colchicine daily  . Diverticulosis   . Cataract     immature left eye  . Hemorrhoids     Current Outpatient Prescriptions on File Prior to Visit  Medication Sig Dispense Refill  . colchicine 0.6 MG tablet Take 1 tablet by mouth as needed.    . cyclobenzaprine (FLEXERIL) 10 MG tablet Take 10 mg by mouth 3 (three) times daily as needed for muscle spasms.    . furosemide (LASIX) 40 MG tablet Take 1 tablet (40 mg total) by mouth 2 (two)  times daily. (Patient taking differently: Take 1.5mg  tablet by mouth daily.) 180 tablet 1  . Multiple Vitamins-Minerals (CENTRUM SILVER ADULT 50+ PO) Take 1 tablet by mouth daily.    . niacin 500 MG tablet Take 500 mg by mouth at bedtime.    . Omega-3 Fatty Acids (FISH OIL) 1000 MG CAPS Take 2,100 mg by mouth daily.     Bertram Gala Glycol-Propyl Glycol (SYSTANE OP) Place 2 drops into both eyes at bedtime.    . potassium chloride (K-DUR) 10 MEQ tablet TAKE 1 CAPSULE BY MOUTH ONCE DAILY 90 tablet 1  . Probiotic Product (PROBIOTIC PO) Take 1 capsule by mouth daily.    . sertraline (ZOLOFT) 25 MG tablet TAKE 1 TABLET BY MOUTH ONCE DAILY 90 tablet 1  . tamsulosin (FLOMAX) 0.4 MG CAPS capsule Take 2 capsules (0.8 mg total) by mouth daily. 180 capsule 1   No current facility-administered medications on file prior to visit.    Allergies  Allergen Reactions  . Cephalosporins Other (See Comments)    Oral thrush  . Hydromorphone Itching  . Oxycodone Itching and Nausea Only  No family history on file.  Social History   Social History  . Marital Status: Married    Spouse Name: N/A  . Number of Children: N/A  . Years of Education: N/A   Occupational History  . Retired    Social History Main Topics  . Smoking status: Former Smoker -- 1.50 packs/day for 55 years    Types: Cigarettes  . Smokeless tobacco: Current User  . Alcohol Use: No  . Drug Use: No  . Sexual Activity: Not Currently   Other Topics Concern  . None   Social History Narrative   Review of Systems - See HPI.  All other ROS are negative.  BP 140/80 mmHg  Pulse 70  Temp(Src) 97.7 F (36.5 C) (Oral)  Ht   Wt   SpO2 95%  Physical Exam  Constitutional: He is oriented to person, place, and time and well-developed, well-nourished, and in no distress.  HENT:  Head: Normocephalic and atraumatic.  Eyes: Conjunctivae are normal.  Neck: Neck supple.  Cardiovascular: Normal rate, regular rhythm, normal heart sounds  and intact distal pulses.   Pulmonary/Chest: Effort normal and breath sounds normal. No respiratory distress. He has no wheezes. He has no rales. He exhibits no tenderness.  Neurological: He is alert and oriented to person, place, and time. He has normal sensation.  Upper extremity strength noted at 5/5 bilaterally.  Lower extremity strength 5/5 overall bilaterally but flexion of R hip with strength at 4/5 noted.   Skin: Skin is warm and dry. No rash noted.  Psychiatric: Affect normal.  Vitals reviewed.   Recent Results (from the past 2160 hour(s))  B Nat Peptide     Status: Abnormal   Collection Time: 07/14/15  2:54 PM  Result Value Ref Range   Pro B Natriuretic peptide (BNP) 134.0 (H) 0.0 - 100.0 pg/mL  Basic Metabolic Panel (BMET)     Status: None   Collection Time: 07/14/15  2:54 PM  Result Value Ref Range   Sodium 143 135 - 145 mEq/L   Potassium 3.6 3.5 - 5.1 mEq/L   Chloride 106 96 - 112 mEq/L   CO2 26 19 - 32 mEq/L   Glucose, Bld 85 70 - 99 mg/dL   BUN 21 6 - 23 mg/dL   Creatinine, Ser 1.910.85 0.40 - 1.50 mg/dL   Calcium 9.3 8.4 - 47.810.5 mg/dL   GFR 29.5693.55 >21.30>60.00 mL/min  TSH     Status: None   Collection Time: 07/14/15  2:54 PM  Result Value Ref Range   TSH 0.69 0.35 - 4.50 uIU/mL    Assessment/Plan: Leg edema Improved. Will repeat BMP. Continue current regimen.  Shortness of breath on exertion Vitals stable. Echocardiogram has been scheduled. Will alter regimen based on results.  Acute stress reaction Is doing well overall. Again discussed he is going through a normal grieving process. Will continue SSRI.  Weakness of both legs Referral to PT placed for assessment and strengthening.

## 2015-07-23 NOTE — Addendum Note (Signed)
Addended by: Verdie ShireBAYNES, ANGELA M on: 07/23/2015 08:07 AM   Modules accepted: Orders

## 2015-07-23 NOTE — Patient Instructions (Signed)
Please continue medications as directed with the exception of stopping Prilosec. I have sent in a prescription for Zantac to take instead.   Elevate legs while resting.  Please go to Echocardiogram appointment as scheduled. Follow-up with Dr. Arbutus Leasat as scheduled.

## 2015-07-24 DIAGNOSIS — F430 Acute stress reaction: Secondary | ICD-10-CM | POA: Insufficient documentation

## 2015-07-24 DIAGNOSIS — R0602 Shortness of breath: Secondary | ICD-10-CM | POA: Insufficient documentation

## 2015-07-24 DIAGNOSIS — R251 Tremor, unspecified: Secondary | ICD-10-CM | POA: Insufficient documentation

## 2015-07-24 NOTE — Assessment & Plan Note (Signed)
BMP reveals stable potassium. Patient only tolerating once daily dose of Lasix 40 mg and he does not like to take BID. Will increase once daily dose to 60 mg. Supportive measures reviewed. FU 1 week.

## 2015-07-24 NOTE — Assessment & Plan Note (Signed)
Feel this is the main contributor to endorsed memory loss, inattention and worsened weakness. MMSE performed with score of 29/30. Neurological exam reveals bilateral tremor but no other deficits or concerning findings. Patient declines increase in Sertraline or starting other medications. Discussed stages of grief with patient and his wife. What he has been experiencing is normal for a grieving father, however we want him to not stop taking care of himself. Will follow very closely Handout on counseling given. Continue Sertraline as directed. Alarm signs/symptoms reviewed that would prompt ER assessment. Will follow closely. FU 1 week.

## 2015-07-24 NOTE — Assessment & Plan Note (Signed)
Bilateral. Worsens with intent. There is + family history or Parkinson's disease. Hard to assess rigidity and gait as patient is currently in wheelchair. Labs obtained today. Will refer to Neurology for more in-depth assessment. For weakness, discussed that strength is good on exam. Recommend PT for gait assessment and strengthening. Also discussed the need for him to walk with his walker as he is able, just refraining from doing so at present. The longer he stays in the wheelchair, the worse things will get.

## 2015-07-24 NOTE — Assessment & Plan Note (Signed)
Mild peripheral edema noted. Lung examination within normal limits. Will check pro-BNP today to verify no concern for start of CHF exacerbation. Will increase Lasix to 60 mg once daily as BMP reveals stable potassium. Elevate extremities. Compression stockings recommended. Order for Echo placed. Patient to schedule FU with Cardiologist.

## 2015-07-25 ENCOUNTER — Ambulatory Visit: Payer: Medicare Other | Admitting: Neurology

## 2015-07-27 DIAGNOSIS — R29898 Other symptoms and signs involving the musculoskeletal system: Secondary | ICD-10-CM | POA: Insufficient documentation

## 2015-07-27 NOTE — Assessment & Plan Note (Signed)
Referral to PT placed for assessment and strengthening.  

## 2015-07-27 NOTE — Assessment & Plan Note (Signed)
Is doing well overall. Again discussed he is going through a normal grieving process. Will continue SSRI.

## 2015-07-27 NOTE — Assessment & Plan Note (Signed)
Improved. Will repeat BMP. Continue current regimen.

## 2015-07-27 NOTE — Assessment & Plan Note (Signed)
Vitals stable. Echocardiogram has been scheduled. Will alter regimen based on results.

## 2015-07-30 ENCOUNTER — Ambulatory Visit (HOSPITAL_BASED_OUTPATIENT_CLINIC_OR_DEPARTMENT_OTHER): Admission: RE | Admit: 2015-07-30 | Payer: Medicare Other | Source: Ambulatory Visit

## 2015-08-04 ENCOUNTER — Ambulatory Visit: Payer: Medicare Other | Admitting: Neurology

## 2015-08-11 ENCOUNTER — Other Ambulatory Visit: Payer: Self-pay | Admitting: Physician Assistant

## 2015-08-12 NOTE — Telephone Encounter (Signed)
Refilled patients rx request with #90 and 1 refill. Last seen in office April 4 , 2017

## 2015-08-13 ENCOUNTER — Other Ambulatory Visit: Payer: Self-pay | Admitting: Physician Assistant

## 2015-08-13 MED ORDER — TAMSULOSIN HCL 0.4 MG PO CAPS: 0.8000 mg | ORAL_CAPSULE | Freq: Every day | ORAL | Status: DC

## 2015-08-13 NOTE — Telephone Encounter (Signed)
Refills sent

## 2015-08-13 NOTE — Telephone Encounter (Signed)
Caller name: Self  Can be reached: [phone]  Pharmacy:  [facility] - [place], [place] - [address] [place] [phone] (Phone) [phone] (Fax)         Reason for call: Request refill on tamsulosin (FLOMAX) 0.4 MG CAPS capsule [[id number]][[id number]]  Request 180 capsules

## 2015-08-20 ENCOUNTER — Ambulatory Visit: Payer: Medicare Other | Admitting: Interventional Cardiology

## 2015-10-23 ENCOUNTER — Other Ambulatory Visit: Payer: Self-pay | Admitting: Neurosurgery

## 2015-10-23 DIAGNOSIS — G912 (Idiopathic) normal pressure hydrocephalus: Secondary | ICD-10-CM

## 2015-10-28 ENCOUNTER — Ambulatory Visit: Payer: Medicare Other | Admitting: Neurology

## 2015-10-29 ENCOUNTER — Ambulatory Visit (INDEPENDENT_AMBULATORY_CARE_PROVIDER_SITE_OTHER): Payer: Medicare Other | Admitting: Neurology

## 2015-10-29 ENCOUNTER — Encounter: Payer: Self-pay | Admitting: Neurology

## 2015-10-29 VITALS — BP 136/78 | HR 78 | Ht 66.0 in | Wt 207.0 lb

## 2015-10-29 DIAGNOSIS — R269 Unspecified abnormalities of gait and mobility: Secondary | ICD-10-CM

## 2015-10-29 DIAGNOSIS — R29898 Other symptoms and signs involving the musculoskeletal system: Secondary | ICD-10-CM

## 2015-10-29 DIAGNOSIS — E538 Deficiency of other specified B group vitamins: Secondary | ICD-10-CM | POA: Diagnosis not present

## 2015-10-29 DIAGNOSIS — M4712 Other spondylosis with myelopathy, cervical region: Secondary | ICD-10-CM

## 2015-10-29 HISTORY — DX: Unspecified abnormalities of gait and mobility: R26.9

## 2015-10-29 NOTE — Patient Instructions (Signed)
Fall Prevention in the Home  Falls can cause injuries and can affect people from all age groups. There are many simple things that you can do to make your home safe and to help prevent falls. WHAT CAN I DO ON THE OUTSIDE OF MY HOME?  Regularly repair the edges of walkways and driveways and fix any cracks.  Remove high doorway thresholds.  Trim any shrubbery on the main path into your home.  Use bright outdoor lighting.  Clear walkways of debris and clutter, including tools and rocks.  Regularly check that handrails are securely fastened and in good repair. Both sides of any steps should have handrails.  Install guardrails along the edges of any raised decks or porches.  Have leaves, snow, and ice cleared regularly.  Use sand or salt on walkways during winter months.  In the garage, clean up any spills right away, including grease or oil spills. WHAT CAN I DO IN THE BATHROOM?  Use night lights.  Install grab bars by the toilet and in the tub and shower. Do not use towel bars as grab bars.  Use non-skid mats or decals on the floor of the tub or shower.  If you need to sit down while you are in the shower, use a plastic, non-slip stool..  Keep the floor dry. Immediately clean up any water that spills on the floor.  Remove soap buildup in the tub or shower on a regular basis.  Attach bath mats securely with double-sided non-slip rug tape.  Remove throw rugs and other tripping hazards from the floor. WHAT CAN I DO IN THE BEDROOM?  Use night lights.  Make sure that a bedside light is easy to reach.  Do not use oversized bedding that drapes onto the floor.  Have a firm chair that has side arms to use for getting dressed.  Remove throw rugs and other tripping hazards from the floor. WHAT CAN I DO IN THE KITCHEN?   Clean up any spills right away.  Avoid walking on wet floors.  Place frequently used items in easy-to-reach places.  If you need to reach for something  above you, use a sturdy step stool that has a grab bar.  Keep electrical cables out of the way.  Do not use floor polish or wax that makes floors slippery. If you have to use wax, make sure that it is non-skid floor wax.  Remove throw rugs and other tripping hazards from the floor. WHAT CAN I DO IN THE STAIRWAYS?  Do not leave any items on the stairs.  Make sure that there are handrails on both sides of the stairs. Fix handrails that are broken or loose. Make sure that handrails are as long as the stairways.  Check any carpeting to make sure that it is firmly attached to the stairs. Fix any carpet that is loose or worn.  Avoid having throw rugs at the top or bottom of stairways, or secure the rugs with carpet tape to prevent them from moving.  Make sure that you have a light switch at the top of the stairs and the bottom of the stairs. If you do not have them, have them installed. WHAT ARE SOME OTHER FALL PREVENTION TIPS?  Wear closed-toe shoes that fit well and support your feet. Wear shoes that have rubber soles or low heels.  When you use a stepladder, make sure that it is completely opened and that the sides are firmly locked. Have someone hold the ladder while you   are using it. Do not climb a closed stepladder.  Add color or contrast paint or tape to grab bars and handrails in your home. Place contrasting color strips on the first and last steps.  Use mobility aids as needed, such as canes, walkers, scooters, and crutches.  Turn on lights if it is dark. Replace any light bulbs that burn out.  Set up furniture so that there are clear paths. Keep the furniture in the same spot.  Fix any uneven floor surfaces.  Choose a carpet design that does not hide the edge of steps of a stairway.  Be aware of any and all pets.  Review your medicines with your healthcare provider. Some medicines can cause dizziness or changes in blood pressure, which increase your risk of falling. Talk  with your health care provider about other ways that you can decrease your risk of falls. This may include working with a physical therapist or trainer to improve your strength, balance, and endurance.   This information is not intended to replace advice given to you by your health care provider. Make sure you discuss any questions you have with your health care provider.   Document Released: 03/26/2002 Document Revised: 08/20/2014 Document Reviewed: 05/10/2014 Elsevier Interactive Patient Education 2016 [employer]  

## 2015-10-29 NOTE — Progress Notes (Signed)
Reason for visit: Gait disorder  Referring physician: Dr. Julio [name] Gulbranson is a 75 y.o. male  History of present illness:  Evan [name] is a 75 year old right-handed white male with a history of severe cervical and lumbar spine disease. The patient has had 5 separate surgeries on the cervical spine. The patient has myelomalacia at the C3-4 and C5-6 levels, he has diffuse hyperreflexia associate with this. The patient has had a chronic gait disorder over the last several years, but since a recent lumbar surgery 1-1/2 or 2 years ago, he has had a significant decline in his ability to ambulate. Immediately after surgery, the patient was noted to have double vision, and he has had some right lower extremity weakness with flexion at the hip. The patient does report some ongoing severe neck pain that is associated with spasms of the neck that may come and go. The patient takes muscle relaxants for this with some benefit, but he does not take the muscle relaxants daily. The patient reports some low back pain, and some pain radiating down the legs. The patient denies any definite weakness of the arms. He denies any severe problems controlling the bladder or the bowels. A recent MRI of the cervical spine and lumbar spine was done. The patient continues to have changes in the cord at the C3-4 and C5-6 levels, but evidence of pontine ischemia was noted on the study. MRI of the low back showed some moderate neuroforaminal stenosis on the right at L3-4 level, the significant L4-5 spinal stenosis has been surgically corrected. The patient has not had any recent falls, he uses a wheelchair for mobility. He has a tendency to lean backwards and to the right when he tries to stand up. He is functionally not able to ambulate at all. According to the patient, he just had MRI evaluation of the brain and this apparently showed ventriculomegaly. The patient is being considered for normal pressure  hydrocephalus, he has been set up for a lumbar puncture in the near future. He is sent to this office for further evaluation.   Past Medical History  Diagnosis Date  . BPH (benign prostatic hyperplasia)     takes Flomax daily  . Arthritis     spine  . Gout, arthritis     feet  . Hard of hearing     does not wear hearing aides  . Depression     takes Zoloft daily  . GERD (gastroesophageal reflux disease)     takes Omeprazole daily  . Hyperlipidemia     takes Niacin daily  . Peripheral edema     takes lasix daily  . Shortness of breath     with exertion  . Pneumonia 2015  . History of bronchitis     "long time ago"  . Migraine     r/t neck issues  . Loss of balance     related to back   . Numbness     hands and related to neck  . Joint pain   . Chronic back pain     stenosis  . Gout     takes Allouprinol and Colchicine daily  . Diverticulosis   . Cataract     immature left eye  . Hemorrhoids   . Abnormality of gait 10/29/2015  . Spondylosis, cervical, with myelopathy 10/29/2015    Past Surgical History  Procedure Laterality Date  . Neck surgery  2009    Cervical Fusion x3  . Ulnar  nerve repair Left 2010  . Tonsillectomy    . Cholecystectomy  10+ years ago  . Cardiovascular stress test  18 YEARS ago  . Cervical fusion      x 5  . Back surgery    . Lumbar fusion  2011  . Hernia repair    . Posterior cervical fusion/foraminotomy  06/09/2011    Procedure: POSTERIOR CERVICAL FUSION/FORAMINOTOMY LEVEL 4;  Surgeon: Mariam Dollar, MD;  Location: [facility];  Service: Neurosurgery;  Laterality: N/A;  Cervical three to seven  posterior cervical fusion, Cervical four to seven Laminectomy, bone graft from right iliac crest   . Lumbar laminectomy/decompression microdiscectomy  10/29/2011    Procedure: LUMBAR LAMINECTOMY/DECOMPRESSION MICRODISCECTOMY 1 LEVEL;  Surgeon: Mariam Dollar, MD;  Location: [facility];  Service: Neurosurgery;  Laterality: Right;  Right Lumbar  Three-Four Lumbar Laminectomy/Microdiscectomy  . Esophagogastroduodenoscopy    . Colonoscopy    . Lumbar fusion  07/23/2014    Post lumbar interbody level 1   . Maximum access (mas)posterior lumbar interbody fusion (plif) 1 level N/A 07/22/2014    Procedure: FOR MAXIMUM ACCESS (MAS) POSTERIOR LUMBAR INTERBODY FUSION (PLIF) 1 LEVEL;  Surgeon: Donalee Citrin, MD;  Location: [facility];  Service: Neurosurgery;  Laterality: N/A;  FOR MAXIMUM ACCESS (MAS) POSTERIOR LUMBAR INTERBODY FUSION (PLIF) 1 LEVEL L4-5  . Carpal tunnel release Left     Family History  Problem Relation Age of Onset  . Diabetes Mother   . Parkinson's disease Father   . Heart disease Father     Social history:  reports that he has quit smoking. His smoking use included Cigarettes. He has a 82.5 pack-year smoking history. He uses smokeless tobacco. He reports that he does not drink alcohol or use illicit drugs.  Medications:  Prior to Admission medications   Medication Sig Start Date End Date Taking? Authorizing Provider  colchicine 0.6 MG tablet Take 1 tablet by mouth as needed. 06/24/14  Yes Historical Provider, MD  cyclobenzaprine (FLEXERIL) 10 MG tablet Take 10 mg by mouth 3 (three) times daily as needed for muscle spasms.   Yes Historical Provider, MD  furosemide (LASIX) 40 MG tablet Take 1 tablet (40 mg total) by mouth 2 (two) times daily. Patient taking differently: Take 1.5mg  tablet by mouth daily. 09/24/14  Yes Waldon Merl, PA-C  Multiple Vitamins-Minerals (CENTRUM SILVER ADULT 50+ PO) Take 1 tablet by mouth daily.   Yes Historical Provider, MD  niacin 500 MG tablet Take 500 mg by mouth at bedtime.   Yes Historical Provider, MD  Omega-3 Fatty Acids (FISH OIL) 1000 MG CAPS Take 2,100 mg by mouth daily.    Yes Historical Provider, MD  Polyethyl Glycol-Propyl Glycol (SYSTANE OP) Place 2 drops into both eyes at bedtime.   Yes Historical Provider, MD  potassium chloride (K-DUR) 10 MEQ tablet TAKE 1 CAPSULE BY MOUTH ONCE  DAILY 05/21/15  Yes Waldon Merl, PA-C  Probiotic Product (PROBIOTIC PO) Take 1 capsule by mouth daily.   Yes Historical Provider, MD  ranitidine (ZANTAC) 150 MG tablet Take 1 tablet (150 mg total) by mouth 2 (two) times daily. 07/23/15  Yes Waldon Merl, PA-C  sertraline (ZOLOFT) 25 MG tablet TAKE 1 TABLET BY MOUTH DAILY 08/12/15  Yes Waldon Merl, PA-C  tamsulosin (FLOMAX) 0.4 MG CAPS capsule Take 2 capsules (0.8 mg total) by mouth daily. 08/13/15  Yes Waldon Merl, PA-C  diazepam (VALIUM) 10 MG tablet  10/22/15   Historical Provider, MD  Allergies  Allergen Reactions  . Cephalosporins Other (See Comments)    Oral thrush  . Hydromorphone Itching  . Oxycodone Itching and Nausea Only    ROS:  Out of a complete 14 system review of symptoms, the patient complains only of the following symptoms, and all other reviewed systems are negative.  Swelling in the legs  Hearing loss, ringing in the ears Double vision Blood in the stool, constipation Urination problems, impotence Memory loss, headache, numbness, weakness, tremor Depression, decreased energy, disinterest in activities Sleepiness, snoring    Blood pressure 136/78, pulse 78, height 5\' 6"  (1.676 m), weight 207 lb (93.895 kg).  Physical Exam  General: The patient is alert and cooperative at the time of the examination.  Eyes: Pupils are equal, round, and reactive to light. Discs are flat bilaterally.  Neck: The neck is supple, no carotid bruits are noted.  Respiratory: The respiratory examination is clear.  Cardiovascular: The cardiovascular examination reveals a regular rate and rhythm, no obvious murmurs or rubs are noted.  Skin: Extremities are with 3+ edema in the lower extremities.  Neurologic Exam  Mental status: The patient is alert and oriented x 3 at the time of the examination. The patient has apparent normal recent and remote memory, with an apparently normal attention span and concentration  ability.  Cranial nerves: Facial symmetry is present. There is good sensation of the face to pinprick and soft touch bilaterally. The strength of the facial muscles and the muscles to head turning and shoulder shrug are normal bilaterally. Speech is well enunciated, no aphasia or dysarthria is noted. Extraocular movements are full. Visual fields are full. The tongue is midline, and the patient has symmetric elevation of the soft palate. No obvious hearing deficits are noted.  Motor: The motor testing reveals 5 over 5 strength of all 4 extremities, With exception of 4/5 strength with hip flexion on the right. Good symmetric motor tone is noted throughout.  Sensory: Sensory testing is intact to pinprick, soft touch, vibration sensation, and position sense on all 4 extremities. No evidence of extinction is noted.  Coordination: Cerebellar testing reveals good finger-nose-finger and heel-to-shin bilaterally.Occasionally, mild resting tremors are seen in both upper extremities.  Gait and station: The patient can stand with some assistance, he has a very definite tendency to lean backwards, he cannot effectively ambulate.  Reflexes: Deep tendon reflexes are notable for decreased biceps reflexes bilaterally, increased triceps reflexes bilaterally, depression of the right knee jerk reflex, brisk left knee jerk reflex and ankle jerk reflexes bilaterally. Toes are downgoing bilaterally.   Assessment/Plan:  1. Multifactorial gait disorder   2. 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 Ventriculomegaly, possible normal pressure hydrocephalus  6. Right proximal leg weakness  The patient has multiple issues at work that may impair gait and balance. I will need to get the report of the MRI of the brain done by Dr. Wynetta Emeryram. The patient does have a mild resting tremor, but no other signs of Parkinson's disease. The  patient may have impairment of gait associated with the pontine ischemia, cervical myelopathy, and the proximal right leg weakness. The patient will undergo blood work today. He will be set up for nerve conduction studies of both legs, EMG on the right leg. If his balance improves after the lumbar puncture, normal pressure hydrocephalus may be a viable diagnostic consideration. He will follow-up for the EMG evaluation.  Marlan Palau MD 10/29/2015 7:19 PM  [facility] [address] [place], [place] [address]  Phone [phone] Fax [phone]

## 2015-10-31 ENCOUNTER — Other Ambulatory Visit: Payer: Self-pay | Admitting: Neurosurgery

## 2015-10-31 ENCOUNTER — Ambulatory Visit
Admission: RE | Admit: 2015-10-31 | Discharge: 2015-10-31 | Disposition: A | Discharge status: Home / Self Care | Payer: Medicare Other | Source: Ambulatory Visit | Attending: Neurosurgery | Admitting: Neurosurgery

## 2015-10-31 ENCOUNTER — Telehealth: Payer: Self-pay

## 2015-10-31 DIAGNOSIS — G912 (Idiopathic) normal pressure hydrocephalus: Secondary | ICD-10-CM

## 2015-10-31 LAB — CSF CELL COUNT WITH DIFFERENTIAL
RBC COUNT CSF: 0 {cells}/uL (ref 0–10)
WBC CSF: 2 {cells}/uL (ref 0–5)

## 2015-10-31 LAB — COPPER, SERUM: Copper: 86 ug/dL (ref 72–166)

## 2015-10-31 LAB — VITAMIN B12: Vitamin B-12: 700 pg/mL (ref 211–946)

## 2015-10-31 LAB — PROTEIN, CSF: Total Protein, CSF: 51 mg/dL (ref 15–60)

## 2015-10-31 LAB — RPR: RPR: NONREACTIVE

## 2015-10-31 LAB — GLUCOSE, CSF: GLUCOSE CSF: 61 mg/dL (ref 43–76)

## 2015-10-31 NOTE — Telephone Encounter (Signed)
-----   Message from York Spanielharles K Willis, MD sent at 10/29/2015  7:25 PM EDT ----- Please call the office of Dr. Wynetta Emeryram and get the most recent MRI brain results  Faxed to our office. Thanks.

## 2015-10-31 NOTE — Discharge Instructions (Signed)

## 2015-10-31 NOTE — Telephone Encounter (Signed)
Called Dr. Lonie Peakram's office and spoke to [place] in medical records. Pt has appt w/ Dr. Wynetta Emeryram 7/22 at which time pt can sign a medical release form and will fax MRI results to our office.

## 2015-11-03 LAB — CSF CULTURE W GRAM STAIN
Gram Stain: NONE SEEN
Gram Stain: NONE SEEN
Organism ID, Bacteria: NO GROWTH

## 2015-11-03 LAB — CSF CULTURE

## 2015-12-07 ENCOUNTER — Observation Stay (HOSPITAL_COMMUNITY)
Admission: EM | Admit: 2015-12-07 | Discharge: 2015-12-09 | Disposition: A | Discharge status: Home / Self Care | Payer: Medicare Other | Attending: Internal Medicine | Admitting: Internal Medicine

## 2015-12-07 ENCOUNTER — Encounter (HOSPITAL_COMMUNITY): Payer: Self-pay | Admitting: Emergency Medicine

## 2015-12-07 DIAGNOSIS — N390 Urinary tract infection, site not specified: Secondary | ICD-10-CM

## 2015-12-07 DIAGNOSIS — K219 Gastro-esophageal reflux disease without esophagitis: Secondary | ICD-10-CM | POA: Diagnosis not present

## 2015-12-07 DIAGNOSIS — F418 Other specified anxiety disorders: Secondary | ICD-10-CM | POA: Diagnosis present

## 2015-12-07 DIAGNOSIS — R791 Abnormal coagulation profile: Secondary | ICD-10-CM | POA: Insufficient documentation

## 2015-12-07 DIAGNOSIS — Z87891 Personal history of nicotine dependence: Secondary | ICD-10-CM | POA: Insufficient documentation

## 2015-12-07 DIAGNOSIS — K625 Hemorrhage of anus and rectum: Secondary | ICD-10-CM | POA: Diagnosis not present

## 2015-12-07 DIAGNOSIS — I5032 Chronic diastolic (congestive) heart failure: Secondary | ICD-10-CM | POA: Diagnosis not present

## 2015-12-07 LAB — PROTIME-INR
INR: 1.03
PROTHROMBIN TIME: 13.5 s (ref 11.4–15.2)

## 2015-12-07 LAB — COMPREHENSIVE METABOLIC PANEL
ALT: 12 U/L — ABNORMAL LOW (ref 17–63)
ANION GAP: 10 (ref 5–15)
AST: 18 U/L (ref 15–41)
Albumin: 3.4 g/dL — ABNORMAL LOW (ref 3.5–5.0)
Alkaline Phosphatase: 77 U/L (ref 38–126)
BUN: 15 mg/dL (ref 6–20)
CHLORIDE: 104 mmol/L (ref 101–111)
CO2: 25 mmol/L (ref 22–32)
Calcium: 9.2 mg/dL (ref 8.9–10.3)
Creatinine, Ser: 0.75 mg/dL (ref 0.61–1.24)
GFR calc non Af Amer: >60 mL/min (ref >60)
Glucose, Bld: 92 mg/dL (ref 65–99)
Potassium: 3.9 mmol/L (ref 3.5–5.1)
SODIUM: 139 mmol/L (ref 135–145)
Total Bilirubin: 0.6 mg/dL (ref 0.3–1.2)
Total Protein: 6.9 g/dL (ref 6.5–8.1)

## 2015-12-07 LAB — APTT: aPTT: 29 seconds (ref 24–36)

## 2015-12-07 LAB — TYPE AND SCREEN
ABO/RH(D): O NEG
Antibody Screen: NEGATIVE

## 2015-12-07 LAB — CBC
HEMATOCRIT: 40.5 % (ref 39.0–52.0)
HEMOGLOBIN: 13 g/dL (ref 13.0–17.0)
MCH: 29.7 pg (ref 26.0–34.0)
MCHC: 32.1 g/dL (ref 30.0–36.0)
MCV: 92.5 fL (ref 78.0–100.0)
Platelets: 231 10*3/uL (ref 150–400)
RBC: 4.38 MIL/uL (ref 4.22–5.81)
RDW: 14 % (ref 11.5–15.5)
WBC: 8.2 10*3/uL (ref 4.0–10.5)

## 2015-12-07 LAB — HEMOGLOBIN: Hemoglobin: 12.6 g/dL — ABNORMAL LOW (ref 13.0–17.0)

## 2015-12-07 LAB — HEMATOCRIT: HCT: 38.7 % — ABNORMAL LOW (ref 39.0–52.0)

## 2015-12-07 MED ORDER — POLYETHYL GLYCOL-PROPYL GLYCOL 0.4-0.3 % OP GEL
Freq: Every day | OPHTHALMIC | Status: DC
Start: 2015-12-07 — End: 2015-12-07
  Filled 2015-12-07: qty 10

## 2015-12-07 MED ORDER — POLYVINYL ALCOHOL 1.4 % OP SOLN
1.0000 [drp] | OPHTHALMIC | Status: DC | PRN
  Filled 2015-12-07: qty 15

## 2015-12-07 MED ORDER — CYCLOBENZAPRINE HCL 10 MG PO TABS: 10.0000 mg | ORAL_TABLET | Freq: Three times a day (TID) | ORAL | Status: DC | PRN

## 2015-12-07 MED ORDER — PROBIOTIC PO CAPS: 1.0000 | ORAL_CAPSULE | Freq: Every day | ORAL | Status: DC

## 2015-12-07 MED ORDER — ONDANSETRON HCL 4 MG PO TABS: 4.0000 mg | ORAL_TABLET | Freq: Four times a day (QID) | ORAL | Status: DC | PRN

## 2015-12-07 MED ORDER — PANTOPRAZOLE SODIUM 40 MG IV SOLR
40.0000 mg | Freq: Two times a day (BID) | INTRAVENOUS | Status: DC
  Administered 2015-12-07 – 2015-12-08 (×2): 40 mg via INTRAVENOUS
  Filled 2015-12-07 (×2): qty 40

## 2015-12-07 MED ORDER — DIAZEPAM 5 MG PO TABS
5.0000 mg | ORAL_TABLET | Freq: Two times a day (BID) | ORAL | Status: DC | PRN
Start: 2015-12-07 — End: 2015-12-09

## 2015-12-07 MED ORDER — SODIUM CHLORIDE 0.9 % IV SOLN
INTRAVENOUS | Status: AC
  Administered 2015-12-07: 21:00:00 via INTRAVENOUS

## 2015-12-07 MED ORDER — RISAQUAD PO CAPS
1.0000 | ORAL_CAPSULE | Freq: Every day | ORAL | Status: DC
  Administered 2015-12-07 – 2015-12-09 (×3): 1 via ORAL
  Filled 2015-12-07 (×3): qty 1

## 2015-12-07 MED ORDER — ONDANSETRON HCL 4 MG/2ML IJ SOLN: 4.0000 mg | Freq: Four times a day (QID) | INTRAMUSCULAR | Status: DC | PRN

## 2015-12-07 MED ORDER — SODIUM CHLORIDE 0.9% FLUSH
3.0000 mL | Freq: Two times a day (BID) | INTRAVENOUS | Status: DC
  Administered 2015-12-07 – 2015-12-09 (×4): 3 mL via INTRAVENOUS

## 2015-12-07 MED ORDER — HYDROCODONE-ACETAMINOPHEN 5-325 MG PO TABS: 1.0000 | ORAL_TABLET | ORAL | Status: DC | PRN

## 2015-12-07 MED ORDER — OMEGA-3-ACID ETHYL ESTERS 1 G PO CAPS
2.0000 g | ORAL_CAPSULE | Freq: Every day | ORAL | Status: DC
  Administered 2015-12-08 – 2015-12-09 (×2): 2 g via ORAL
  Filled 2015-12-07 (×2): qty 2

## 2015-12-07 MED ORDER — ACETAMINOPHEN 500 MG PO TABS
1000.0000 mg | ORAL_TABLET | Freq: Once | ORAL | Status: AC
  Administered 2015-12-07: 1000 mg via ORAL
  Filled 2015-12-07: qty 2

## 2015-12-07 MED ORDER — ACETAMINOPHEN 650 MG RE SUPP: 650.0000 mg | Freq: Four times a day (QID) | RECTAL | Status: DC | PRN

## 2015-12-07 MED ORDER — ACETAMINOPHEN 325 MG PO TABS
650.0000 mg | ORAL_TABLET | Freq: Four times a day (QID) | ORAL | Status: DC | PRN
  Administered 2015-12-08 – 2015-12-09 (×2): 650 mg via ORAL
  Filled 2015-12-07 (×2): qty 2

## 2015-12-07 MED ORDER — TAMSULOSIN HCL 0.4 MG PO CAPS
0.8000 mg | ORAL_CAPSULE | Freq: Every day | ORAL | Status: DC
  Administered 2015-12-08 – 2015-12-09 (×2): 0.8 mg via ORAL
  Filled 2015-12-07 (×2): qty 2

## 2015-12-07 MED ORDER — SERTRALINE HCL 50 MG PO TABS
25.0000 mg | ORAL_TABLET | Freq: Every day | ORAL | Status: DC
  Administered 2015-12-08 – 2015-12-09 (×2): 25 mg via ORAL
  Filled 2015-12-07 (×2): qty 1

## 2015-12-07 NOTE — ED Provider Notes (Signed)
[facility] Provider Note   CSN: [id number] Arrival date & time: 12/07/15  1540     History   Chief Complaint Chief Complaint  Patient presents with  . Rectal Bleeding    HPI Evan [name] is a 75 y.o. male.  HPI   This is a 75 year old man with a history of back pain, hemorrhoids, who presents today with 2 weeks of rectal bleeding. He states that over the past 2 weeks he has had some blood in his stool. He was seen by his primary care doctor and it was felt to be a hemorrhoid. He had more bleeding today. He states that there was a lot of blood in the stool at least twice. He describes the blood as dark but his daily member, in the room, describes it as bright light bread. He denies any weakness, chest pain, or dyspnea. He takes aspirin but denies any other blood thinners. He denies any history of gastritis or GI bleeding in the past. He states that he usually has problems with impaction. No hematemesis emesis.  Past Medical History:  Diagnosis Date  . Abnormality of gait 10/29/2015  . Arthritis    spine  . BPH (benign prostatic hyperplasia)    takes Flomax daily  . Cataract    immature left eye  . Chronic back pain    stenosis  . Depression    takes Zoloft daily  . Diverticulosis   . GERD (gastroesophageal reflux disease)    takes Omeprazole daily  . Gout    takes Allouprinol and Colchicine daily  . Gout, arthritis    feet  . Hard of hearing    does not wear hearing aides  . Hemorrhoids   . History of bronchitis    "long time ago"  . Hyperlipidemia    takes Niacin daily  . Joint pain   . Loss of balance    related to back   . Migraine    r/t neck issues  . Numbness    hands and related to neck  . Peripheral edema    takes lasix daily  . Pneumonia 2015  . Shortness of breath    with exertion  . Spondylosis, cervical, with myelopathy 10/29/2015    Patient Active Problem List   Diagnosis Date Noted  . Abnormality of gait 10/29/2015  .  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 Weakness of both legs 07/27/2015  . Shortness of breath on exertion 07/24/2015  . Tremor 07/24/2015  . Acute stress reaction 07/24/2015  . Screening, ischemic heart disease 01/29/2015  . Hernia of abdominal cavity 12/20/2014  . Gastroesophageal reflux disease without esophagitis 09/03/2014  . Anxiety and depression 09/03/2014  . BPH (benign prostatic hyperplasia) 09/03/2014  . Gout, chronic 09/03/2014  . Spondylolisthesis at L4-L5 level 07/22/2014  . Diastolic dysfunction with heart failure (HCC) 01/01/2014  . Peripheral neuropathy (HCC) 01/01/2014  . DOE (dyspnea on exertion) 11/15/2013  . Leg edema 11/15/2013  . Snoring 11/15/2013  . COPD pfts pending  05/10/2013    Past Surgical History:  Procedure Laterality Date  . BACK SURGERY    . CARDIOVASCULAR STRESS TEST  18 YEARS ago  . CARPAL TUNNEL RELEASE Left   . CERVICAL FUSION     x 5  . CHOLECYSTECTOMY  10+ years ago  . COLONOSCOPY    . ESOPHAGOGASTRODUODENOSCOPY    . HERNIA REPAIR    . LUMBAR FUSION  2011  . LUMBAR FUSION  07/23/2014   Post lumbar interbody  level 1   . LUMBAR LAMINECTOMY/DECOMPRESSION MICRODISCECTOMY  10/29/2011   Procedure: LUMBAR LAMINECTOMY/DECOMPRESSION MICRODISCECTOMY 1 LEVEL;  Surgeon: Mariam DollarGary P Cram, MD;  Location: [facility];  Service: Neurosurgery;  Laterality: Right;  Right Lumbar Three-Four Lumbar Laminectomy/Microdiscectomy  . MAXIMUM ACCESS (MAS)POSTERIOR LUMBAR INTERBODY FUSION (PLIF) 1 LEVEL N/A 07/22/2014   Procedure: FOR MAXIMUM ACCESS (MAS) POSTERIOR LUMBAR INTERBODY FUSION (PLIF) 1 LEVEL;  Surgeon: Donalee CitrinGary Cram, MD;  Location: [facility];  Service: Neurosurgery;  Laterality: N/A;  FOR MAXIMUM ACCESS (MAS) POSTERIOR LUMBAR INTERBODY FUSION (PLIF) 1 LEVEL L4-5  . NECK SURGERY  2009   Cervical Fusion x3  . POSTERIOR CERVICAL FUSION/FORAMINOTOMY  06/09/2011   Procedure: POSTERIOR CERVICAL FUSION/FORAMINOTOMY LEVEL 4;  Surgeon: Mariam DollarGary P Cram, MD;   Location: [facility];  Service: Neurosurgery;  Laterality: N/A;  Cervical three to seven  posterior cervical fusion, Cervical four to seven Laminectomy, bone graft from right iliac crest   . TONSILLECTOMY    . ULNAR NERVE REPAIR Left 2010       Home Medications    Prior to Admission medications   Medication Sig Start Date End Date Taking? Authorizing Provider  colchicine 0.6 MG tablet Take 1 tablet by mouth as needed. 06/24/14   Historical Provider, MD  cyclobenzaprine (FLEXERIL) 10 MG tablet Take 10 mg by mouth 3 (three) times daily as needed for muscle spasms.    Historical Provider, MD  diazepam (VALIUM) 10 MG tablet  10/22/15   Historical Provider, MD  furosemide (LASIX) 40 MG tablet Take 1 tablet (40 mg total) by mouth 2 (two) times daily. Patient taking differently: Take 1.5mg  tablet by mouth daily. 09/24/14   Waldon MerlWilliam C Martin, PA-C  Multiple Vitamins-Minerals (CENTRUM SILVER ADULT 50+ PO) Take 1 tablet by mouth daily.    Historical Provider, MD  niacin 500 MG tablet Take 500 mg by mouth at bedtime.    Historical Provider, MD  Omega-3 Fatty Acids (FISH OIL) 1000 MG CAPS Take 2,100 mg by mouth daily.     Historical Provider, MD  Polyethyl Glycol-Propyl Glycol (SYSTANE OP) Place 2 drops into both eyes at bedtime.    Historical Provider, MD  potassium chloride (K-DUR) 10 MEQ tablet TAKE 1 CAPSULE BY MOUTH ONCE DAILY 05/21/15   Waldon MerlWilliam C Martin, PA-C  Probiotic Product (PROBIOTIC PO) Take 1 capsule by mouth daily.    Historical Provider, MD  ranitidine (ZANTAC) 150 MG tablet Take 1 tablet (150 mg total) by mouth 2 (two) times daily. 07/23/15   Waldon MerlWilliam C Martin, PA-C  sertraline (ZOLOFT) 25 MG tablet TAKE 1 TABLET BY MOUTH DAILY 08/12/15   Waldon MerlWilliam C Martin, PA-C  tamsulosin (FLOMAX) 0.4 MG CAPS capsule Take 2 capsules (0.8 mg total) by mouth daily. 08/13/15   Waldon MerlWilliam C Martin, PA-C    Family History Family History  Problem Relation Age of Onset  . Diabetes Mother   . Parkinson's disease  Father   . Heart disease Father     Social History Social History  Substance Use Topics  . Smoking status: Former Smoker    Packs/day: 1.50    Years: 55.00    Types: Cigarettes  . Smokeless tobacco: Current User  . Alcohol use No     Allergies   Cephalosporins; Hydromorphone; and Oxycodone   Review of Systems Review of Systems  All other systems reviewed and are negative.    Physical Exam Updated Vital Signs BP 169/85   Pulse 64   Temp 98 F (36.7 C) (Oral)  Resp 17   Ht 5\' 4"  (1.626 m)   Wt 93.9 kg   SpO2 98%   BMI 35.53 kg/m   Physical Exam  Constitutional: He is oriented to person, place, and time. He appears well-developed and well-nourished.  HENT:  Head: Normocephalic and atraumatic.  Eyes: Conjunctivae and EOM are normal. Pupils are equal, round, and reactive to light.  Neck: Normal range of motion.  Cardiovascular: Normal rate, regular rhythm and normal heart sounds.   Pulmonary/Chest: Effort normal.  Abdominal: Soft. Bowel sounds are normal. He exhibits no distension and no mass. There is no guarding.  Genitourinary:  Genitourinary Comments: Anoscopy performed and internal hemorrhoid visualized without bleeding there is bleeding noted at the end of the anoscope approximately 15 cm from the anus proximal to the hemorrhoid  Musculoskeletal: Normal range of motion. He exhibits no edema.  Neurological: He is alert and oriented to person, place, and time.  Skin: Skin is warm. Capillary refill takes less than 2 seconds.  Psychiatric: He has a normal mood and affect.  Nursing note and vitals reviewed.    ED Treatments / Results  Labs (all labs ordered are listed, but only abnormal results are displayed) Labs Reviewed  COMPREHENSIVE METABOLIC PANEL - Abnormal; Notable for the following:       Result Value   Albumin 3.4 (*)    ALT 12 (*)    All other components within normal limits  CBC  POC OCCULT BLOOD, ED  TYPE AND SCREEN    EKG  EKG  Interpretation  Date/Time:  Sunday December 07 2015 17:40:09 EDT Ventricular Rate:  71 PR Interval:  160 QRS Duration: 144 QT Interval:  420 QTC Calculation: 456 R Axis:   49 Text Interpretation:  Sinus rhythm with Premature atrial complexes Right bundle branch block Abnormal ECG Right bundle branch block new from first prior Confirmed by Vishwa Dais MD, Duwayne HeckANIELLE ([id number]) on 12/07/2015 5:53:07 PM       Radiology No results found.  Procedures Procedures (including critical care time)  Medications Ordered in ED Medications - No data to display   Initial Impression / Assessment and Plan / ED Course  I have reviewed the triage vital signs and the nursing notes.  Pertinent labs & imaging results that were available during my care of the patient were reviewed by me and considered in my medical decision making (see chart for details).  Clinical Course  75 year old man presents today with rectal bleeding. He is hemodynamically stable and hemoglobin is normal. Plan observation and further evaluation Discussed with Dr. Antionette Charpyd and plan telemetry Final Clinical Impressions(s) / ED Diagnoses   Final diagnoses:  Rectal bleeding    New Prescriptions New Prescriptions   No medications on file     Margarita Grizzleanielle Cosima Prentiss, MD 12/07/15 1810

## 2015-12-07 NOTE — ED Notes (Signed)
RN attempted IV 2x unsuccessfully. Will get another RN to attempt. Pt. Can be transported to 6E after IV placement.

## 2015-12-07 NOTE — ED Triage Notes (Signed)
Pt c/o rectal bleeding increasing this week. Pt denies abdominal pain.

## 2015-12-07 NOTE — H&P (Addendum)
History and Physical    HERSCHEL FLEAGLE ZOX:[id number] DOB: 1941-02-23 DOA: 12/07/2015  PCP: Piedad Climes, PA-C   Patient coming from: Home   Chief Complaint: Rectal bleeding   HPI: Evan [name] is a 75 y.o. 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 Patient reports that he has noted small amounts of blood in his bowel movements for years now which has been attributed to an internal hemorrhoid. He has not been anemic from this. He takes colchicine rarely for gout, but no other NSAID. He has had colonoscopies with [facility], most recently in 2001. I'm unable to see this report, but the patient recalls that it was notable only for diverticulosis. Flexible sigmoidoscopy report from 1996 reviewed and notable for moderate-severe diverticulosis. Patient has been in his usual state of health with no abdominal pain or nausea. He has GERD, but is asymptomatic as lungs he takes his daily Prilosec. He reports a tendency towards constipation, but denies any diarrhea or vomiting. He has not had any fevers, chills, chest pain, palpitations, dyspnea, or cough. Patient has not been lightheaded. He does have a headache which she reports is chronic and associated with NPH for which she follows with neurology. He denies any change in his vision or hearing or focal numbness or weakness. He reports that he is no longer ambulatory do to his chronic lumbar spine issues.   ED Course: Upon arrival to the ED, patient is found to be afebrile, saturating well on room air, and with vital signs stable. EKG demonstrates a sinus rhythm with PAC and right bundle branch block also seen on the last EKG. Chemistry panel is largely unremarkable and CBC is within the normal limits, including a hemoglobin of 13.0. Type and screen was performed  in the ED and anoscopy was performed by the ED physician. There was notation of a nonbleeding internal hemorrhoid with frank blood proximal to this. Patient has remained normotensive with normal heart rate and no active bleeding observed in the ED. He will be observed on the telemetry unit for ongoing evaluation and management of painless hematochezia.   Review of Systems:  All other systems reviewed and apart from HPI, are negative.  Past Medical History:  Diagnosis Date  . Abnormality of gait 10/29/2015  . Arthritis    spine  . BPH (benign prostatic hyperplasia)    takes Flomax daily  . Cataract    immature left eye  . Chronic back pain    stenosis  . Chronic diastolic CHF (congestive heart failure) (HCC) 12/07/2015  . Depression    takes Zoloft daily  . Diverticulosis   . GERD (gastroesophageal reflux disease)    takes Omeprazole daily  . Gout    takes Allouprinol and Colchicine daily  . Gout, arthritis    feet  . Hard of hearing    does not wear hearing aides  . Hemorrhoids   . History of bronchitis    "long time ago"  . Hyperlipidemia    takes Niacin daily  . Joint pain   . Loss of balance    related to back   . Migraine    r/t neck issues  . Numbness    hands and related to neck  . Peripheral edema    takes lasix daily  . Pneumonia 2015  . Shortness of breath    with exertion  .  Spondylosis, cervical, with myelopathy 10/29/2015    Past Surgical History:  Procedure Laterality Date  . BACK SURGERY    . CARDIOVASCULAR STRESS TEST  18 YEARS ago  . CARPAL TUNNEL RELEASE Left   . CERVICAL FUSION     x 5  . CHOLECYSTECTOMY  10+ years ago  . COLONOSCOPY    . ESOPHAGOGASTRODUODENOSCOPY    . HERNIA REPAIR    . LUMBAR FUSION  2011  . LUMBAR FUSION  07/23/2014   Post lumbar interbody level 1   . LUMBAR LAMINECTOMY/DECOMPRESSION MICRODISCECTOMY  10/29/2011   Procedure: LUMBAR LAMINECTOMY/DECOMPRESSION MICRODISCECTOMY 1 LEVEL;  Surgeon: Mariam DollarGary P Cram, MD;  Location:  [facility];  Service: Neurosurgery;  Laterality: Right;  Right Lumbar Three-Four Lumbar Laminectomy/Microdiscectomy  . MAXIMUM ACCESS (MAS)POSTERIOR LUMBAR INTERBODY FUSION (PLIF) 1 LEVEL N/A 07/22/2014   Procedure: FOR MAXIMUM ACCESS (MAS) POSTERIOR LUMBAR INTERBODY FUSION (PLIF) 1 LEVEL;  Surgeon: Donalee CitrinGary Cram, MD;  Location: [facility];  Service: Neurosurgery;  Laterality: N/A;  FOR MAXIMUM ACCESS (MAS) POSTERIOR LUMBAR INTERBODY FUSION (PLIF) 1 LEVEL L4-5  . NECK SURGERY  2009   Cervical Fusion x3  . POSTERIOR CERVICAL FUSION/FORAMINOTOMY  06/09/2011   Procedure: POSTERIOR CERVICAL FUSION/FORAMINOTOMY LEVEL 4;  Surgeon: Mariam DollarGary P Cram, MD;  Location: [facility];  Service: Neurosurgery;  Laterality: N/A;  Cervical three to seven  posterior cervical fusion, Cervical four to seven Laminectomy, bone graft from right iliac crest   . TONSILLECTOMY    . ULNAR NERVE REPAIR Left 2010     reports that he has quit smoking. His smoking use included Cigarettes. He has a 82.50 pack-year smoking history. He uses smokeless tobacco. He reports that he does not drink alcohol or use drugs.  Allergies  Allergen Reactions  . Cephalosporins Other (See Comments)    Oral thrush  . Hydromorphone Itching  . Valium [Diazepam] Other (See Comments)    Balance is off, leans to one side, like vertigo  . Oxycodone Itching and Nausea Only    Family History  Problem Relation Age of Onset  . Diabetes Mother   . Parkinson's disease Father   . Heart disease Father      Prior to Admission medications   Medication Sig Start Date End Date Taking? Authorizing Provider  acetaminophen (TYLENOL) 650 MG CR tablet Take 1,300 mg by mouth 2 (two) times daily.   Yes Historical Provider, MD  colchicine 0.6 MG tablet Take 0.6 mg by mouth as needed (for gout).  06/24/14  Yes Historical Provider, MD  cyclobenzaprine (FLEXERIL) 10 MG tablet Take 10 mg by mouth 3 (three) times daily as needed for muscle spasms.   Yes Historical  Provider, MD  diazepam (VALIUM) 10 MG tablet Take 5 mg by mouth 2 (two) times daily as needed (for muscle spasms).  10/22/15  Yes Historical Provider, MD  Multiple Vitamins-Minerals (CENTRUM SILVER ADULT 50+ PO) Take 1 tablet by mouth daily.   Yes Historical Provider, MD  niacin 500 MG tablet Take 500 mg by mouth every morning.    Yes Historical Provider, MD  Omega-3 Fatty Acids (FISH OIL) 1000 MG CAPS Take 2,000 mg by mouth daily.    Yes Historical Provider, MD  omeprazole (PRILOSEC) 20 MG capsule Take 20 mg by mouth every morning.   Yes Historical Provider, MD  Polyethyl Glycol-Propyl Glycol (SYSTANE OP) Place 2 drops into both eyes at bedtime.   Yes Historical Provider, MD  Probiotic Product (PROBIOTIC PO) Take 1 capsule by mouth daily.  Yes Historical Provider, MD  sertraline (ZOLOFT) 25 MG tablet TAKE 1 TABLET BY MOUTH DAILY Patient taking differently: TAKE 1 TABLET (25 mg) BY MOUTH DAILY 08/12/15  Yes Waldon MerlWilliam C Martin, PA-C  tamsulosin (FLOMAX) 0.4 MG CAPS capsule Take 2 capsules (0.8 mg total) by mouth daily. 08/13/15  Yes Waldon MerlWilliam C Martin, PA-C  furosemide (LASIX) 40 MG tablet Take 1 tablet (40 mg total) by mouth 2 (two) times daily. Patient not taking: Reported on 12/07/2015 09/24/14   Waldon MerlWilliam C Martin, PA-C  potassium chloride (K-DUR) 10 MEQ tablet TAKE 1 CAPSULE BY MOUTH ONCE DAILY Patient not taking: Reported on 12/07/2015 05/21/15   Waldon MerlWilliam C Martin, PA-C    Physical Exam: Vitals:   12/07/15 1546 12/07/15 1654 12/07/15 1655 12/07/15 1700  BP:  160/79  169/85  Pulse:   68 64  Resp:   13 17  Temp:      TempSrc:      SpO2:   97% 98%  Weight: 93.9 kg (207 lb)     Height: 5\' 4"  (1.626 m)         Constitutional: NAD, calm, comfortable, no pallor Eyes: PERTLA, lids and conjunctivae normal ENMT: Mucous membranes are moist. Posterior pharynx clear of any exudate or lesions.   Neck: normal, supple, no masses, no thyromegaly Respiratory: clear to auscultation bilaterally, no wheezing, no  crackles. Normal respiratory effort.   Cardiovascular: S1 & S2 heard, regular rate and rhythm, no significant murmur. Trace b/l pretibial edema. No carotid bruits. No significant JVD. Abdomen: No distension, no tenderness, no masses palpated. Bowel sounds normal.  Musculoskeletal: no clubbing / cyanosis. No joint deformity upper and lower extremities. Normal muscle tone.  Skin: no significant rashes, lesions, ulcers. Warm, dry, well-perfused. Neurologic: CN 2-12 grossly intact. Sensation intact, DTR normal. Strength 5/5 in all 4 limbs.  Psychiatric: Normal judgment and insight. Alert and oriented x 3. Normal mood and affect.     Labs on Admission: I have personally reviewed following labs and imaging studies  CBC:  Recent Labs Lab 12/07/15 1551  WBC 8.2  HGB 13.0  HCT 40.5  MCV 92.5  PLT 231   Basic Metabolic Panel:  Recent Labs Lab 12/07/15 1551  NA 139  K 3.9  CL 104  CO2 25  GLUCOSE 92  BUN 15  CREATININE 0.75  CALCIUM 9.2   GFR: Estimated Creatinine Clearance: 83.8 mL/min (by C-G formula based on SCr of 0.8 mg/dL). Liver Function Tests:  Recent Labs Lab 12/07/15 1551  AST 18  ALT 12*  ALKPHOS 77  BILITOT 0.6  PROT 6.9  ALBUMIN 3.4*   No results for input(s): LIPASE, AMYLASE in the last 168 hours. No results for input(s): AMMONIA in the last 168 hours. Coagulation Profile: No results for input(s): INR, PROTIME in the last 168 hours. Cardiac Enzymes: No results for input(s): CKTOTAL, CKMB, CKMBINDEX, TROPONINI in the last 168 hours. BNP (last 3 results)  Recent Labs  07/14/15 1454  PROBNP 134.0*   HbA1C: No results for input(s): HGBA1C in the last 72 hours. CBG: No results for input(s): GLUCAP in the last 168 hours. Lipid Profile: No results for input(s): CHOL, HDL, LDLCALC, TRIG, CHOLHDL, LDLDIRECT in the last 72 hours. Thyroid Function Tests: No results for input(s): TSH, T4TOTAL, FREET4, T3FREE, THYROIDAB in the last 72 hours. Anemia  Panel: No results for input(s): VITAMINB12, FOLATE, FERRITIN, TIBC, IRON, RETICCTPCT in the last 72 hours. Urine analysis:    Component Value Date/Time   COLORURINE YELLOW 01/21/2015 1437  APPEARANCEUR CLEAR 01/21/2015 1437   LABSPEC 1.015 01/21/2015 1437   PHURINE 5.5 01/21/2015 1437   GLUCOSEU NEGATIVE 01/21/2015 1437   HGBUR NEGATIVE 01/21/2015 1437   BILIRUBINUR NEGATIVE 01/21/2015 1437   KETONESUR NEGATIVE 01/21/2015 1437   PROTEINUR NEGATIVE 07/24/2014 2312   UROBILINOGEN 0.2 01/21/2015 1437   NITRITE NEGATIVE 01/21/2015 1437   LEUKOCYTESUR NEGATIVE 01/21/2015 1437   Sepsis Labs: @LABRCNTIP (procalcitonin:4,lacticidven:4) )No results found for this or any previous visit (from the past 240 hour(s)).   Radiological Exams on Admission: No results found.  EKG: Independently reviewed. Sinus rhythm, PAC, RBBB, no significant change from EKG in March '17  Assessment/Plan  1. Painless hematochezia  - Pt reports yrs of minor bleeding attributed to an internal hemorrhoid, but has had BRB today  - There is no abd pain or nausea; pt feels urge to pass gas, then oozes BRBPR  - Colonoscopy performed in 2001; unable to see report in EMR, pt recalls being told of diverticulosis, no polyps; flex sig report from 1996 reviewed and notable for moderate-severe diverticulosis   - Initial Hgb 13.0 with normal MCV; there is no tachycardia or hypotension  - Suspect this is likely a diverticular bleed, or AVM  - Monitor on telemetry; follow serial H&H; type and screen  - Place on Protonix 40 mg IV q12h given hx of GERD and possibility of upper source   2. GERD  - Managed with Prilosec qD at home - Currently on Protonix IV BID as above  - No EGD on file    3. Chronic diastolic CHF  - Appears euvolemic on presentation  - TTE (11/27/13) with EF 50-55%, grade 1 diastolic dysfunction, mild MR, mild increased PA peak pressures - Not requiring diuretic at home  - Follow daily wts and I/O's    4.  Depression, anxiety  - Stable, continue current management with Zoloft and prn Valium   DVT prophylaxis: SCD's  Code Status: Full  Family Communication: Wife updated at bedside  Disposition Plan: Observe on telemetry Consults called: None Admission status: Observation    Briscoe Deutscher, MD [facility] Pager [phone]  If 7PM-7AM, please contact night-coverage [email or web address] Password [email or web address]  12/07/2015, 6:42 PM

## 2015-12-08 ENCOUNTER — Encounter: Payer: Medicare Other | Admitting: Neurology

## 2015-12-08 DIAGNOSIS — F418 Other specified anxiety disorders: Secondary | ICD-10-CM | POA: Diagnosis not present

## 2015-12-08 DIAGNOSIS — K219 Gastro-esophageal reflux disease without esophagitis: Secondary | ICD-10-CM | POA: Diagnosis not present

## 2015-12-08 DIAGNOSIS — K625 Hemorrhage of anus and rectum: Secondary | ICD-10-CM | POA: Diagnosis not present

## 2015-12-08 DIAGNOSIS — I5032 Chronic diastolic (congestive) heart failure: Secondary | ICD-10-CM | POA: Diagnosis not present

## 2015-12-08 LAB — BASIC METABOLIC PANEL
ANION GAP: 8 (ref 5–15)
BUN: 12 mg/dL (ref 6–20)
CALCIUM: 8.7 mg/dL — AB (ref 8.9–10.3)
CO2: 25 mmol/L (ref 22–32)
CREATININE: 0.76 mg/dL (ref 0.61–1.24)
Chloride: 107 mmol/L (ref 101–111)
GFR calc Af Amer: >60 mL/min (ref >60)
GLUCOSE: 102 mg/dL — AB (ref 65–99)
Potassium: 3.5 mmol/L (ref 3.5–5.1)
Sodium: 140 mmol/L (ref 135–145)

## 2015-12-08 LAB — URINALYSIS, ROUTINE W REFLEX MICROSCOPIC
BILIRUBIN URINE: NEGATIVE
GLUCOSE, UA: NEGATIVE mg/dL
HGB URINE DIPSTICK: NEGATIVE
KETONES UR: NEGATIVE mg/dL
Nitrite: POSITIVE — AB
PROTEIN: NEGATIVE mg/dL
Specific Gravity, Urine: 1.02 (ref 1.005–1.030)
pH: 6 (ref 5.0–8.0)

## 2015-12-08 LAB — URINE MICROSCOPIC-ADD ON: Squamous Epithelial / LPF: NONE SEEN

## 2015-12-08 LAB — HEMATOCRIT
HEMATOCRIT: 36.5 % — AB (ref 39.0–52.0)
HEMATOCRIT: 39 % (ref 39.0–52.0)

## 2015-12-08 LAB — HEMOGLOBIN
HEMOGLOBIN: 11.5 g/dL — AB (ref 13.0–17.0)
HEMOGLOBIN: 12.2 g/dL — AB (ref 13.0–17.0)

## 2015-12-08 MED ORDER — PANTOPRAZOLE SODIUM 40 MG PO TBEC
40.0000 mg | DELAYED_RELEASE_TABLET | Freq: Every day | ORAL | Status: DC
  Administered 2015-12-09: 40 mg via ORAL
  Filled 2015-12-08: qty 1

## 2015-12-08 MED ORDER — HYDRALAZINE HCL 20 MG/ML IJ SOLN
5.0000 mg | Freq: Four times a day (QID) | INTRAMUSCULAR | Status: DC | PRN
  Filled 2015-12-08: qty 1

## 2015-12-08 NOTE — Progress Notes (Signed)
Progress Note    Evan HillockRichard A [name]  [id number] DOB: 1940-07-29  DOA: 12/07/2015 PCP: Piedad ClimesMartin, William Cody, PA-C    Brief Narrative:   Evan [name] is an 75 y.o. male with medical history significant for Diverticulosis, GERD, depression, anxiety, chronic diastolic CHF, and lumbar spinal stenosis status post multiple surgeries who was admitted 12/07/15 with painless hematochezia. Flexible sigmoidoscopy report from 1996 reviewed and notable for moderate-severe diverticulosis. Hemoglobin was 13 on admission.  Assessment/Plan:   Principal problem:  Painless hematochezia  Likely diverticular. Colonoscopy performed in 2001; unable to see report in EMR, pt recalls being told of diverticulosis, no polyps; flex sig report from 1996 reviewed and notable for moderate-severe diverticulosis. Hemoglobin has trended down from admission values, but the patient has not had any further rectal bleeding. Will advance diet and if hemoglobin remains stable, can likely discharge home with outpatient follow-up.    Active problems:  GERD  Will discontinue IV Protonix and place on oral Protonix.  Chronic diastolic CHF  TTE (11/27/13) with EF 50-55%, grade 1 diastolic dysfunction, mild MR, mild increased PA peak pressures. Currently compensated.  Depression, anxiety  Stable, continue current management with Zoloft and prn Valium   Family Communication/Anticipated D/C date and plan/Code Status   DVT prophylaxis: SCDs ordered. Code Status: Full Code.  Family Communication: Wife updated at the bedside. Disposition Plan: Home tomorrow if hemoglobin remains stable and there is no reports of further rectal bleeding.   Medical Consultants:    None.   Procedures:    None  Anti-Infectives:   None.  Subjective:    Evan [name] reports that he has not had any further rectal bleeding. Feels well. No nausea or vomiting. No abdominal pain.  Objective:    Vitals:     12/07/15 1846 12/07/15 1958 12/08/15 0514 12/08/15 0735  BP: 129/99 (!) 166/65 (!) 144/66 (!) 156/87  Pulse: 70 67 61 (!) 52  Resp: 13 18 19 18   Temp:  97.8 F (36.6 C) 98.6 F (37 C) 98.2 F (36.8 C)  TempSrc:  Oral Oral Oral  SpO2: 95% 95% 95% 96%  Weight:  96.2 kg (212 lb 1.3 oz)    Height:  5\' 6"  (1.676 m)      Intake/Output Summary (Last 24 hours) at 12/08/15 1148 Last data filed at 12/08/15 0800  Gross per 24 hour  Intake            725.5 ml  Output                0 ml  Net            725.5 ml   Filed Weights   12/07/15 1546 12/07/15 1958  Weight: 93.9 kg (207 lb) 96.2 kg (212 lb 1.3 oz)    Exam: General exam: Appears calm and comfortable.  Respiratory system: Clear to auscultation. Respiratory effort normal. Cardiovascular system: S1 & S2 heard, RRR. No JVD,  rubs, gallops or clicks. No murmurs. Gastrointestinal system: Abdomen is nondistended, soft and nontender. No organomegaly or masses felt. Normal bowel sounds heard. Central nervous system: Alert and oriented. No focal neurological deficits. Extremities: No clubbing,  or cyanosis. Trace pretibial edema. Skin: No rashes, lesions or ulcers. Psychiatry: Judgement and insight appear normal. Mood & affect appropriate.   Data Reviewed:   I have personally reviewed following labs and imaging studies:  Labs: Basic Metabolic Panel:  Recent Labs Lab 12/07/15 1551 12/08/15 0727  NA 139 140  K 3.9  3.5  CL 104 107  CO2 25 25  GLUCOSE 92 102*  BUN 15 12  CREATININE 0.75 0.76  CALCIUM 9.2 8.7*   GFR Estimated Creatinine Clearance: 88 mL/min (by C-G formula based on SCr of 0.8 mg/dL). Liver Function Tests:  Recent Labs Lab 12/07/15 1551  AST 18  ALT 12*  ALKPHOS 77  BILITOT 0.6  PROT 6.9  ALBUMIN 3.4*   No results for input(s): LIPASE, AMYLASE in the last 168 hours. No results for input(s): AMMONIA in the last 168 hours. Coagulation profile  Recent Labs Lab 12/07/15 1956  INR 1.03     CBC:  Recent Labs Lab 12/07/15 1551 12/07/15 1956 12/08/15 0022 12/08/15 0727  WBC 8.2  --   --   --   HGB 13.0 12.6* 11.5* 12.2*  HCT 40.5 38.7* 36.5* 39.0  MCV 92.5  --   --   --   PLT 231  --   --   --    Cardiac Enzymes: No results for input(s): CKTOTAL, CKMB, CKMBINDEX, TROPONINI in the last 168 hours. BNP (last 3 results)  Recent Labs  07/14/15 1454  PROBNP 134.0*    Microbiology No results found for this or any previous visit (from the past 240 hour(s)).  Radiology: No results found.  Medications:   . acidophilus  1 capsule Oral Daily  . omega-3 acid ethyl esters  2 g Oral Daily  . pantoprazole  40 mg Intravenous Q12H  . sertraline  25 mg Oral Daily  . sodium chloride flush  3 mL Intravenous Q12H  . tamsulosin  0.8 mg Oral Daily   Continuous Infusions:   Time spent: 25 minutes.   LOS: 0 days   Keslyn Teater  [facility] Pager [phone]. If unable to reach me by pager, please call my cell phone at [phone].  *Please refer to [email or web address], password [email or web address] to get updated schedule on who will round on this patient, as hospitalists switch teams weekly. If 7PM-7AM, please contact night-coverage at [email or web address], password [email or web address] for any overnight needs.  12/08/2015, 11:48 AM

## 2015-12-08 NOTE — Progress Notes (Signed)
Patient blood pressure 172/77. On call NP notified. Awaiting orders.  Jodelle GreenYari L Kiannah Grunow, RN

## 2015-12-08 NOTE — Progress Notes (Signed)
New Admission Note:  Arrival Method: Stretcher Mental Orientation: alert & oriented x 4 Telemetry: box 12 ccmd notified  Assessment: Completed Skin: completed with Christel MormonZee RN, check flowsheet IV: L forearm , clean , dry, intact Pain: 0/10 Tubes: n/a Safety Measures: Safety Fall Prevention Plan was given, discussed with patient  Admission: Completed [address] [place] Orientation: Patient has been orientated to the room, unit and the staff. Family: wife at the bedside   Orders have been reviewed and implemented. Will continue to monitor the patient. Call light has been placed within reach and bed alarm has been activated.   Lawernce IonYari Berry Gallacher ,RN

## 2015-12-09 DIAGNOSIS — N390 Urinary tract infection, site not specified: Secondary | ICD-10-CM

## 2015-12-09 DIAGNOSIS — K625 Hemorrhage of anus and rectum: Secondary | ICD-10-CM | POA: Diagnosis not present

## 2015-12-09 LAB — URINE CULTURE: CULTURE: NO GROWTH

## 2015-12-09 LAB — GLUCOSE, CAPILLARY: Glucose-Capillary: 112 mg/dL — ABNORMAL HIGH (ref 65–99)

## 2015-12-09 LAB — HEMOGLOBIN AND HEMATOCRIT, BLOOD
HEMATOCRIT: 39.2 % (ref 39.0–52.0)
HEMOGLOBIN: 12.7 g/dL — AB (ref 13.0–17.0)

## 2015-12-09 MED ORDER — SULFAMETHOXAZOLE-TRIMETHOPRIM 800-160 MG PO TABS: 1.0000 | ORAL_TABLET | Freq: Two times a day (BID) | ORAL | 0 refills | Status: DC

## 2015-12-09 MED ORDER — OMEPRAZOLE 20 MG PO CPDR: 20.0000 mg | DELAYED_RELEASE_CAPSULE | ORAL | 0 refills | Status: DC

## 2015-12-09 NOTE — Care Management Obs Status (Signed)
MEDICARE OBSERVATION STATUS NOTIFICATION   Patient Details  Name: Evan [name] MRN: [id number] Date of Birth: 1941/02/14   Medicare Observation Status Notification Given:  Yes    Corday Wyka, Annamarie MajorCheryl U, RN 12/09/2015, 12:32 PM

## 2015-12-09 NOTE — Care Management Note (Signed)
Case Management Note  Patient Details  Name: Evan [name] MRN: [id number] Date of Birth: [birth year]  Subjective/Objective:         CM following for progression and d/c planning.            Action/Plan: 12/09/2015 Met with pt and wife re d/c plan. Pt will return to home no DME or HH needs. OBS status discussed.   Expected Discharge Date:     12/09/2015             Expected Discharge Plan:  Home/Self Care  In-House Referral:  NA  Discharge planning Services  NA  Post Acute Care Choice:  NA Choice offered to:  NA  DME Arranged:   NA DME Agency:   NA  HH Arranged:   NA HH Agency:   NA  Status of Service:  Completed, signed off  If discussed at [place] of Stay Meetings, dates discussed:    Additional Comments:  Adron Bene, RN 12/09/2015, 12:33 PM

## 2015-12-09 NOTE — Discharge Summary (Signed)
Discharge Summary  Pearletha FurlRichard A Lantz [id number] DOB: [birth year]  PCP: Piedad ClimesMartin, William Cody, PA-C  Admit date: 12/07/2015 Discharge date: 12/09/2015   Recommendations for Outpatient Follow-up:  1. PCP 1-2 weeks.   Discharge Diagnoses:  Active Hospital Problems   Diagnosis Date Noted  . Rectal bleed 12/07/2015  . UTI (urinary tract infection) 12/09/2015  . Chronic diastolic CHF (congestive heart failure) (HCC) 12/07/2015  . Rectal bleeding 12/07/2015  . Gastroesophageal reflux disease without esophagitis 09/03/2014  . Depression with anxiety 09/03/2014    Resolved Hospital Problems   Diagnosis Date Noted Date Resolved  No resolved problems to display.    Discharge Condition: Stable   Diet recommendation: Regular   Vitals:   12/09/15 0413 12/09/15 0948  BP: (!) 158/88 (!) 148/69  Pulse: 72 74  Resp: 18 16  Temp: 97.8 F (36.6 C) 98.2 F (36.8 C)    History of present illness:  Darrelyn HillockRichard A Teal is an 75 y.o. male with medical history significant for Diverticulosis, GERD, depression, anxiety, chronic diastolic CHF, and lumbar spinal stenosis status post multiple surgeries who was admitted 12/07/15 with painless hematochezia. Flexible sigmoidoscopy report from 1996 reviewed and notable for moderate-severe diverticulosis. Hemoglobin was 13 on admission. He also complained of dysuria and UA and culture were sent for this.   Hospital Course:  Principal Problem:   Rectal bleed - likely diverticular bleed, has history of this. Active Problems:   Gastroesophageal reflux disease without esophagitis   Depression with anxiety   Chronic diastolic CHF (congestive heart failure) (HCC) - no evidence of exacerbation   Rectal bleeding   UTI (urinary tract infection)  Patient was admitted for observation, and Hb was trended. It has been stable and he has had no further bleeding. UA was checked for dysuria and antibiotics were not initiated. Today, he feels well,  tolerating diet with stable Hb and continued dysuria. As such, will go home today and strict return precautions such as fevers, new bleeding were discussed with him and his wife. He was started on daily oral PPI.  He was discharged on PO Bactrim DS empirically for UTI.  Procedures:  None   Consultations:  None   Discharge Exam: BP (!) 148/69 (BP Location: Left Arm)   Pulse 74   Temp 98.2 F (36.8 C) (Oral)   Resp 16   Ht 5\' 6"  (1.676 m)   Wt 96.4 kg (212 lb 8.4 oz)   SpO2 94%   BMI 34.30 kg/m  General:  Alert, oriented, calm, in no acute distress  Eyes: pupils round and reactive to light and accomodation, clear sclerea Neck: supple, no masses, trachea mildline  Cardiovascular: RRR, no murmurs or rubs, no peripheral edema  Respiratory: clear to auscultation bilaterally, no wheezes, no crackles  Abdomen: soft, nontender, nondistended, normal bowel tones heard  Skin: dry, no rashes  Musculoskeletal: no joint effusions, normal range of motion  Psychiatric: appropriate affect, normal speech  Neurologic: extraocular muscles intact, clear speech, moving all extremities with intact sensorium    Discharge Instructions You were cared for by a hospitalist during your hospital stay. If you have any questions about your discharge medications or the care you received while you were in the hospital after you are discharged, you can call the unit and asked to speak with the hospitalist on call if the hospitalist that took care of you is not available. Once you are discharged, your primary care physician will handle any further medical issues. Please note that NO REFILLS for any  discharge medications will be authorized once you are discharged, as it is imperative that you return to your primary care physician (or establish a relationship with a primary care physician if you do not have one) for your aftercare needs so that they can reassess your need for medications and monitor your lab  values.  Discharge Instructions    Diet - low sodium heart healthy    Complete by:  As directed   Increase activity slowly    Complete by:  As directed       Medication List    STOP taking these medications   furosemide 40 MG tablet Commonly known as:  LASIX   potassium chloride 10 MEQ tablet Commonly known as:  K-DUR     TAKE these medications   acetaminophen 650 MG CR tablet Commonly known as:  TYLENOL Take 1,300 mg by mouth 2 (two) times daily.   CENTRUM SILVER ADULT 50+ PO Take 1 tablet by mouth daily.   colchicine 0.6 MG tablet Take 0.6 mg by mouth as needed (for gout).   cyclobenzaprine 10 MG tablet Commonly known as:  FLEXERIL Take 10 mg by mouth 3 (three) times daily as needed for muscle spasms.   diazepam 10 MG tablet Commonly known as:  VALIUM Take 5 mg by mouth 2 (two) times daily as needed (for muscle spasms).   Fish Oil 1000 MG Caps Take 2,000 mg by mouth daily.   niacin 500 MG tablet Take 500 mg by mouth every morning.   omeprazole 20 MG capsule Commonly known as:  PRILOSEC Take 1 capsule (20 mg total) by mouth every morning.   PROBIOTIC PO Take 1 capsule by mouth daily.   sertraline 25 MG tablet Commonly known as:  ZOLOFT TAKE 1 TABLET BY MOUTH DAILY What changed:  See the new instructions.   sulfamethoxazole-trimethoprim 800-160 MG tablet Commonly known as:  BACTRIM DS,SEPTRA DS Take 1 tablet by mouth 2 (two) times daily.   SYSTANE OP Place 2 drops into both eyes at bedtime.   tamsulosin 0.4 MG Caps capsule Commonly known as:  FLOMAX Take 2 capsules (0.8 mg total) by mouth daily.      Allergies  Allergen Reactions  . Cephalosporins Other (See Comments)    Oral thrush  . Hydromorphone Itching  . Valium [Diazepam] Other (See Comments)    Balance is off, leans to one side, like vertigo  . Oxycodone Itching and Nausea Only      The results of significant diagnostics from this hospitalization (including imaging, microbiology,  ancillary and laboratory) are listed below for reference.    Significant Diagnostic Studies: No results found.  Microbiology: No results found for this or any previous visit (from the past 240 hour(s)).   Labs: Basic Metabolic Panel:  Recent Labs Lab 12/07/15 1551 12/08/15 0727  NA 139 140  K 3.9 3.5  CL 104 107  CO2 25 25  GLUCOSE 92 102*  BUN 15 12  CREATININE 0.75 0.76  CALCIUM 9.2 8.7*   Liver Function Tests:  Recent Labs Lab 12/07/15 1551  AST 18  ALT 12*  ALKPHOS 77  BILITOT 0.6  PROT 6.9  ALBUMIN 3.4*   No results for input(s): LIPASE, AMYLASE in the last 168 hours. No results for input(s): AMMONIA in the last 168 hours. CBC:  Recent Labs Lab 12/07/15 1551 12/07/15 1956 12/08/15 0022 12/08/15 0727 12/09/15 0852  WBC 8.2  --   --   --   --   HGB 13.0 12.6*  11.5* 12.2* 12.7*  HCT 40.5 38.7* 36.5* 39.0 39.2  MCV 92.5  --   --   --   --   PLT 231  --   --   --   --    Cardiac Enzymes: No results for input(s): CKTOTAL, CKMB, CKMBINDEX, TROPONINI in the last 168 hours. BNP: BNP (last 3 results) No results for input(s): BNP in the last 8760 hours.  ProBNP (last 3 results)  Recent Labs  07/14/15 1454  PROBNP 134.0*    CBG:  Recent Labs Lab 12/09/15 0800  GLUCAP 112*    Time spent: 35 minutes were spent in preparing this discharge including medication reconciliation, counseling, and coordination of care.  Signed:  Mir Vergie Living  [facility] 12/09/2015, 12:35 PM

## 2015-12-10 ENCOUNTER — Telehealth: Payer: Self-pay | Admitting: Behavioral Health

## 2015-12-10 NOTE — Addendum Note (Signed)
Addended by: Melanee SpryBYRD, RONECIA E on: 12/10/2015 03:08 PM   Modules accepted: Orders

## 2015-12-10 NOTE — Telephone Encounter (Addendum)
Transition Care Management Follow-up Telephone Call  PCP: Piedad ClimesMartin, William Cody, PA-C  Admit date: 12/07/2015 Discharge date: 12/09/2015   Recommendations for Outpatient Follow-up:  PCP 1-2 weeks.   Discharge Diagnoses:  Active Hospital Problems   Diagnosis Date Noted  . Rectal bleed 12/07/2015  . UTI (urinary tract infection) 12/09/2015  . Chronic diastolic CHF (congestive heart failure) (HCC) 12/07/2015  . Rectal bleeding 12/07/2015  . Gastroesophageal reflux disease without esophagitis 09/03/2014  . Depression with anxiety 09/03/2014     Resolved Hospital Problems   Diagnosis Date Noted Date Resolved  No resolved problems to display.    Discharge Condition: Stable    How have you been since you were released from the hospital? Patient stated, "I'm doing great".   Do you understand why you were in the hospital? yes   Do you understand the discharge instructions? yes   Where were you discharged to? Home with wife.   Items Reviewed:  Medications reviewed: yes  Allergies reviewed: yes  Dietary changes reviewed: yes, regular diet  Referrals reviewed: yes, follow-up with PCP.   Functional Questionnaire:   Activities of Daily Living (ADLs):   He states they are independent in the following: bathing and hygiene, feeding, continence, grooming, toileting and dressing States they require assistance with the following: ambulation   Any transportation issues/concerns?: no   Any patient concerns? no   Confirmed importance and date/time of follow-up visits scheduled yes, 12/19/15 at 11:30 AM.  Provider Appointment booked with Waldon MerlWilliam C. Martin, PA-C.  Confirmed with patient if condition begins to worsen call PCP or go to the ER.  Patient was given the office number and encouraged to call back with question or concerns.  : yes

## 2015-12-12 ENCOUNTER — Telehealth: Payer: Self-pay | Admitting: Physician Assistant

## 2015-12-12 NOTE — Telephone Encounter (Signed)
I can set up but I have to have a face-to-face visit for medicare to pay for the services. He has to have been seen within the past 60-days. They require note documenting the visit, exam and reason services are medically necessary. I can set up for them as soon as I have office notes to send to home health along with the referral.

## 2015-12-12 NOTE — Telephone Encounter (Signed)
Pt's spouse called in to reschedule pt's appt and also to request a home health referral. She says that she needs assistance at home for him.    Please advise.   [phone]

## 2015-12-19 ENCOUNTER — Inpatient Hospital Stay: Payer: Medicare Other | Admitting: Physician Assistant

## 2015-12-23 ENCOUNTER — Ambulatory Visit (INDEPENDENT_AMBULATORY_CARE_PROVIDER_SITE_OTHER): Payer: Medicare Other | Admitting: Physician Assistant

## 2015-12-23 ENCOUNTER — Encounter: Payer: Self-pay | Admitting: Physician Assistant

## 2015-12-23 VITALS — BP 116/82 | HR 62 | Temp 97.6°F | Resp 16 | Ht 66.0 in | Wt 212.0 lb

## 2015-12-23 DIAGNOSIS — K5909 Other constipation: Secondary | ICD-10-CM

## 2015-12-23 DIAGNOSIS — K5730 Diverticulosis of large intestine without perforation or abscess without bleeding: Secondary | ICD-10-CM | POA: Diagnosis not present

## 2015-12-23 DIAGNOSIS — K625 Hemorrhage of anus and rectum: Secondary | ICD-10-CM | POA: Diagnosis not present

## 2015-12-23 DIAGNOSIS — K5900 Constipation, unspecified: Secondary | ICD-10-CM

## 2015-12-23 MED ORDER — OMEPRAZOLE 20 MG PO CPDR: 20.0000 mg | DELAYED_RELEASE_CAPSULE | ORAL | 1 refills | Status: DC

## 2015-12-23 NOTE — Progress Notes (Signed)
Patient presents to clinic today for hospital follow-up. Patient presented to ER on 12/07/2015 and subsequently admitted for observation due to painless hematochezia. Hemoglobin was noted to be at 13 on admission and was trended. Hemoglobin stable during admission. Hematochezia ceased during hospitalization. Patient also with some UTI symptoms during stay. Culture obtained via catheterization. Patient was started on Bactrim and completed course. Urine culture was negative. Patient was started on daily oral PPI and encouraged to FU here with Primary Care and with his GI to set up colonoscopy.   Since discharge patient endorses taking PPI as directed. Denies any recurrence of hematochezia. Denies melena or tenesmus. Does note some intermittent constipation. Is not hydrating as well as he should per his wife. Denies new or worsening symptoms.   Past Medical History:  Diagnosis Date  . Abnormality of gait 10/29/2015  . Arthritis    spine  . BPH (benign prostatic hyperplasia)    takes Flomax daily  . Cataract    immature left eye  . Chronic back pain    stenosis  . Chronic diastolic CHF (congestive heart failure) ([place]) 12/07/2015  . Depression    takes Zoloft daily  . Diverticulosis   . GERD (gastroesophageal reflux disease)    takes Omeprazole daily  . Gout    takes Allouprinol and Colchicine daily  . Gout, arthritis    feet  . Hard of hearing    does not wear hearing aides  . Hemorrhoids   . History of bronchitis    "long time ago"  . Hyperlipidemia    takes Niacin daily  . Joint pain   . Loss of balance    related to back   . Migraine    r/t neck issues  . Numbness    hands and related to neck  . Peripheral edema    takes lasix daily  . Pneumonia 2015  . Shortness of breath    with exertion  . Spondylosis, cervical, with myelopathy 10/29/2015    Current Outpatient Prescriptions on File Prior to Visit  Medication Sig Dispense Refill  . colchicine 0.6 MG tablet Take 0.6  mg by mouth as needed (for gout).     . cyclobenzaprine (FLEXERIL) 10 MG tablet Take 10 mg by mouth 3 (three) times daily as needed for muscle spasms.    . diazepam (VALIUM) 10 MG tablet Take 5 mg by mouth 2 (two) times daily as needed (for muscle spasms).     . Multiple Vitamins-Minerals (CENTRUM SILVER ADULT 50+ PO) Take 1 tablet by mouth daily.    . niacin 500 MG tablet Take 500 mg by mouth every morning.     . Omega-3 Fatty Acids (FISH OIL) 1000 MG CAPS Take 2,000 mg by mouth daily.     Marland Kitchen omeprazole (PRILOSEC) 20 MG capsule Take 1 capsule (20 mg total) by mouth every morning. 30 capsule 0  . Polyethyl Glycol-Propyl Glycol (SYSTANE OP) Place 2 drops into both eyes at bedtime.    . Probiotic Product (PROBIOTIC PO) Take 1 capsule by mouth daily.    . sertraline (ZOLOFT) 25 MG tablet TAKE 1 TABLET BY MOUTH DAILY (Patient taking differently: TAKE 1 TABLET (25 mg) BY MOUTH DAILY) 90 tablet 1  . tamsulosin (FLOMAX) 0.4 MG CAPS capsule Take 2 capsules (0.8 mg total) by mouth daily. 180 capsule 1   No current facility-administered medications on file prior to visit.     Allergies  Allergen Reactions  . Cephalosporins Other (See Comments)  Oral thrush  . Hydromorphone Itching  . Valium [Diazepam] Other (See Comments)    Balance is off, leans to one side, like vertigo  . Oxycodone Itching and Nausea Only    Family History  Problem Relation Age of Onset  . Diabetes Mother   . Parkinson's disease Father   . Heart disease Father     Social History   Social History  . Marital status: Married    Spouse name: N/A  . Number of children: 3  . Years of education: 12   Occupational History  . Retired    Social History Main Topics  . Smoking status: Former Smoker    Packs/day: 1.50    Years: 55.00    Types: Cigarettes  . Smokeless tobacco: Current User  . Alcohol use No  . Drug use: No  . Sexual activity: Not Currently   Other Topics Concern  . None   Social History Narrative     Lives at home w/ his wife   Right-handed   Drinks 1-2 sodas per day    Review of Systems - See HPI.  All other ROS are negative.  BP 116/82 (BP Location: Left Arm, Patient Position: Sitting, Cuff Size: Large)   Pulse 62   Temp 97.6 F (36.4 C) (Oral)   Resp 16   Ht _0  (1.676 m)   Wt 212 lb (96.2 kg) Comment: Patient reported/SLS  SpO2 96%   BMI 34.22 kg/m   Physical Exam  Constitutional: He is well-developed, well-nourished, and in no distress.  HENT:  Head: Normocephalic and atraumatic.  Eyes: Conjunctivae are normal. Pupils are equal, round, and reactive to light.  Neck: Neck supple.  Cardiovascular: Normal rate, regular rhythm, normal heart sounds and intact distal pulses.   Pulmonary/Chest: Effort normal and breath sounds normal. No respiratory distress. He has no wheezes. He has no rales. He exhibits no tenderness.  Abdominal: Soft. Bowel sounds are normal. He exhibits no distension and no mass. There is no tenderness. There is no rebound and no guarding.  Skin: Skin is warm and dry. No rash noted.  Psychiatric: Affect normal.  Vitals reviewed.   Recent Results (from the past 2160 hour(s))  RPR     Status: None   Collection Time: 10/29/15  2:52 PM  Result Value Ref Range   RPR Ser Ql Non Reactive Non Reactive  Vitamin B12     Status: None   Collection Time: 10/29/15  2:52 PM  Result Value Ref Range   Vitamin B-12 700 211 - 946 pg/mL  Copper, serum     Status: None   Collection Time: 10/29/15  2:52 PM  Result Value Ref Range   Copper 86 72 - 166 ug/dL    Comment:                                 Detection Limit = 5  CSF culture     Status: None   Collection Time: 10/31/15 12:00 AM  Result Value Ref Range   Gram Stain No WBC Seen    Gram Stain No Organisms Seen    Gram Stain CYTOSPIN    Organism ID, Bacteria NO GROWTH 3 DAYS   CSF cell count with differential     Status: None   Collection Time: 10/31/15 12:00 AM  Result Value Ref Range   Color, CSF  COLORLESS COLORLESS   Appearance, CSF CLEAR CLEAR  RBC Count, CSF 0 0 - 10 cells/uL   WBC, CSF 2 0 - 5 cells/uL   Segmented Neutrophils-CSF SEE NOTE 0 - 6 %    Comment:   Test not performed.  This test cannot be performed due to an insufficient number of analyzable cells in the sample. Test has been cancelled.      Lymphs, CSF SEE NOTE 40 - 80 %    Comment:   Test not performed.  This test cannot be performed due to an insufficient number of analyzable cells in the sample. Test has been cancelled.      Monocyte/Macrophage SEE NOTE 15 - 45 %    Comment:   Test not performed.  This test cannot be performed due to an insufficient number of analyzable cells in the sample. Test has been cancelled.      Eosinophils, CSF SEE NOTE 0 %    Comment:   Test not performed.  This test cannot be performed due to an insufficient number of analyzable cells in the sample. Test has been cancelled.      Basophils, % SEE NOTE 0 %    Comment:   Test not performed.  This test cannot be performed due to an insufficient number of analyzable cells in the sample. Test has been cancelled.     Glucose, CSF     Status: None   Collection Time: 10/31/15 12:00 AM  Result Value Ref Range   Glucose, CSF 61 43 - 76 mg/dL  Protein, CSF     Status: None   Collection Time: 10/31/15 12:00 AM  Result Value Ref Range   Total Protein, CSF 51 15 - 60 mg/dL  Comprehensive metabolic panel     Status: Abnormal   Collection Time: 12/07/15  3:51 PM  Result Value Ref Range   Sodium 139 135 - 145 mmol/L   Potassium 3.9 3.5 - 5.1 mmol/L   Chloride 104 101 - 111 mmol/L   CO2 25 22 - 32 mmol/L   Glucose, Bld 92 65 - 99 mg/dL   BUN 15 6 - 20 mg/dL   Creatinine, Ser 0.75 0.61 - 1.24 mg/dL   Calcium 9.2 8.9 - 10.3 mg/dL   Total Protein 6.9 6.5 - 8.1 g/dL   Albumin 3.4 (L) 3.5 - 5.0 g/dL   AST 18 15 - 41 U/L   ALT 12 (L) 17 - 63 U/L   Alkaline Phosphatase 77 38 - 126 U/L   Total Bilirubin 0.6 0.3 - 1.2 mg/dL     GFR calc non Af Amer >60 >60 mL/min   GFR calc Af Amer >60 >60 mL/min    Comment: (NOTE) The eGFR has been calculated using the CKD EPI equation. This calculation has not been validated in all clinical situations. eGFR's persistently <60 mL/min signify possible Chronic Kidney Disease.    Anion gap 10 5 - 15  CBC     Status: None   Collection Time: 12/07/15  3:51 PM  Result Value Ref Range   WBC 8.2 4.0 - 10.5 K/uL   RBC 4.38 4.22 - 5.81 MIL/uL   Hemoglobin 13.0 13.0 - 17.0 g/dL   HCT 40.5 39.0 - 52.0 %   MCV 92.5 78.0 - 100.0 fL   MCH 29.7 26.0 - 34.0 pg   MCHC 32.1 30.0 - 36.0 g/dL   RDW 14.0 11.5 - 15.5 %   Platelets 231 150 - 400 K/uL  Type and screen [place]     Status:  None   Collection Time: 12/07/15  3:51 PM  Result Value Ref Range   ABO/RH(D) O NEG    Antibody Screen NEG    Sample Expiration 12/10/2015   APTT     Status: None   Collection Time: 12/07/15  7:56 PM  Result Value Ref Range   aPTT 29 24 - 36 seconds  Protime-INR     Status: None   Collection Time: 12/07/15  7:56 PM  Result Value Ref Range   Prothrombin Time 13.5 11.4 - 15.2 seconds   INR 1.03   Hemoglobin     Status: Abnormal   Collection Time: 12/07/15  7:56 PM  Result Value Ref Range   Hemoglobin 12.6 (L) 13.0 - 17.0 g/dL  Hematocrit     Status: Abnormal   Collection Time: 12/07/15  7:56 PM  Result Value Ref Range   HCT 38.7 (L) 39.0 - 52.0 %  Hemoglobin     Status: Abnormal   Collection Time: 12/08/15 12:22 AM  Result Value Ref Range   Hemoglobin 11.5 (L) 13.0 - 17.0 g/dL  Hematocrit     Status: Abnormal   Collection Time: 12/08/15 12:22 AM  Result Value Ref Range   HCT 36.5 (L) 39.0 - 52.0 %  Hemoglobin     Status: Abnormal   Collection Time: 12/08/15  7:27 AM  Result Value Ref Range   Hemoglobin 12.2 (L) 13.0 - 17.0 g/dL  Hematocrit     Status: None   Collection Time: 12/08/15  7:27 AM  Result Value Ref Range   HCT 39.0 39.0 - 33.2 %  Basic metabolic panel      Status: Abnormal   Collection Time: 12/08/15  7:27 AM  Result Value Ref Range   Sodium 140 135 - 145 mmol/L   Potassium 3.5 3.5 - 5.1 mmol/L   Chloride 107 101 - 111 mmol/L   CO2 25 22 - 32 mmol/L   Glucose, Bld 102 (H) 65 - 99 mg/dL   BUN 12 6 - 20 mg/dL   Creatinine, Ser 0.76 0.61 - 1.24 mg/dL   Calcium 8.7 (L) 8.9 - 10.3 mg/dL   GFR calc non Af Amer >60 >60 mL/min   GFR calc Af Amer >60 >60 mL/min    Comment: (NOTE) The eGFR has been calculated using the CKD EPI equation. This calculation has not been validated in all clinical situations. eGFR's persistently <60 mL/min signify possible Chronic Kidney Disease.    Anion gap 8 5 - 15  Urinalysis, Routine w reflex microscopic (not at [facility])     Status: Abnormal   Collection Time: 12/08/15  3:37 PM  Result Value Ref Range   Color, Urine YELLOW YELLOW   APPearance HAZY (A) CLEAR   Specific Gravity, Urine 1.020 1.005 - 1.030   pH 6.0 5.0 - 8.0   Glucose, UA NEGATIVE NEGATIVE mg/dL   Hgb urine dipstick NEGATIVE NEGATIVE   Bilirubin Urine NEGATIVE NEGATIVE   Ketones, ur NEGATIVE NEGATIVE mg/dL   Protein, ur NEGATIVE NEGATIVE mg/dL   Nitrite POSITIVE (A) NEGATIVE   Leukocytes, UA SMALL (A) NEGATIVE  Culture, Urine     Status: None   Collection Time: 12/08/15  3:37 PM  Result Value Ref Range   Specimen Description URINE, CATHETERIZED    Special Requests NONE    Culture NO GROWTH    Report Status 12/09/2015 FINAL   Urine microscopic-add on     Status: Abnormal   Collection Time: 12/08/15  3:37 PM  Result Value Ref  Range   Squamous Epithelial / LPF NONE SEEN NONE SEEN   WBC, UA 6-30 0 - 5 WBC/hpf   RBC / HPF 0-5 0 - 5 RBC/hpf   Bacteria, UA MANY (A) NONE SEEN   Urine-Other MUCOUS PRESENT   Glucose, capillary     Status: Abnormal   Collection Time: 12/09/15  8:00 AM  Result Value Ref Range   Glucose-Capillary 112 (H) 65 - 99 mg/dL  Hemoglobin and hematocrit, blood     Status: Abnormal   Collection Time: 12/09/15  8:52 AM   Result Value Ref Range   Hemoglobin 12.7 (L) 13.0 - 17.0 g/dL   HCT 39.2 39.0 - 52.0 %    Assessment/Plan: 1. Diverticulosis of colon without hemorrhage Referral to GI placed for screening colonoscopy to further assess things.  - Ambulatory referral to Gastroenterology  2. Chronic constipation Bowel regimen reviewed with patient and wife. Hydration is key. Will increase fluid intake.  - Ambulatory referral to Gastroenterology  3. Rectal bleeding Resolved during hospitalizations. Hgb remained stable and normal throughout stay. No residual symptoms. Will monitor. Patient has appt with GI. Referral has been placed for insurance purposes. Alarm signs/symptoms discussed with patient and wife that would prompt return to office or ER setting.    Leeanne Rio, PA-C

## 2015-12-23 NOTE — Patient Instructions (Signed)
Please keep your phone on. You will be contacted by Gastroenterology for assessment.  I encourage you to increase hydration and the amount of fiber in your diet.  Start a daily probiotic (Align, Culturelle, Digestive Advantage, etc.). If no bowel movement within 24 hours, take 2 Tbs of Milk of Magnesia or drink a 4 oz glass of warmed prune juice every 2-3 days to help promote bowel movement. If no results within 24 hours, then repeat above regimen, adding a Dulcolax stool softener to regimen. If this does not promote a bowel movement, please call the office.

## 2015-12-25 ENCOUNTER — Encounter: Payer: Self-pay | Admitting: Internal Medicine

## 2015-12-25 ENCOUNTER — Telehealth: Payer: Self-pay | Admitting: Internal Medicine

## 2015-12-25 DIAGNOSIS — M5137 Other intervertebral disc degeneration, lumbosacral region: Secondary | ICD-10-CM | POA: Diagnosis not present

## 2015-12-25 NOTE — Telephone Encounter (Signed)
Pt states he was in the hospital in august for rectal bleeding. Pt reports he is having rectal bleeding again, reports there is a significant amount of brb. Pt scheduled to see Mike GipAmy Esterwood PA tomorrow at 2pm. Pt aware of appt.

## 2015-12-26 ENCOUNTER — Other Ambulatory Visit (INDEPENDENT_AMBULATORY_CARE_PROVIDER_SITE_OTHER): Payer: Medicare Other

## 2015-12-26 ENCOUNTER — Ambulatory Visit (INDEPENDENT_AMBULATORY_CARE_PROVIDER_SITE_OTHER): Payer: Medicare Other | Admitting: Physician Assistant

## 2015-12-26 ENCOUNTER — Encounter: Payer: Self-pay | Admitting: Physician Assistant

## 2015-12-26 VITALS — BP 134/74 | HR 70 | Ht 66.0 in | Wt 212.0 lb

## 2015-12-26 DIAGNOSIS — K625 Hemorrhage of anus and rectum: Secondary | ICD-10-CM

## 2015-12-26 DIAGNOSIS — D649 Anemia, unspecified: Secondary | ICD-10-CM | POA: Diagnosis not present

## 2015-12-26 LAB — CBC WITH DIFFERENTIAL/PLATELET
BASOS ABS: 0 10*3/uL (ref 0.0–0.1)
Basophils Relative: 0.1 % (ref 0.0–3.0)
EOS PCT: 0.7 % (ref 0.0–5.0)
Eosinophils Absolute: 0.1 10*3/uL (ref 0.0–0.7)
HCT: 38.8 % — ABNORMAL LOW (ref 39.0–52.0)
Hemoglobin: 12.8 g/dL — ABNORMAL LOW (ref 13.0–17.0)
LYMPHS ABS: 1.3 10*3/uL (ref 0.7–4.0)
Lymphocytes Relative: 14 % (ref 12.0–46.0)
MCHC: 33.1 g/dL (ref 30.0–36.0)
MCV: 88.7 fl (ref 78.0–100.0)
MONOS PCT: 6.3 % (ref 3.0–12.0)
Monocytes Absolute: 0.6 10*3/uL (ref 0.1–1.0)
NEUTROS ABS: 7.5 10*3/uL (ref 1.4–7.7)
NEUTROS PCT: 78.9 % — AB (ref 43.0–77.0)
PLATELETS: 297 10*3/uL (ref 150.0–400.0)
RBC: 4.37 Mil/uL (ref 4.22–5.81)
RDW: 15.3 % (ref 11.5–15.5)
WBC: 9.6 10*3/uL (ref 4.0–10.5)

## 2015-12-26 MED ORDER — NA SULFATE-K SULFATE-MG SULF 17.5-3.13-1.6 GM/177ML PO SOLN: 1.0000 | ORAL | 0 refills | Status: DC

## 2015-12-26 NOTE — Patient Instructions (Addendum)
You have been scheduled for a colonoscopy. Please follow written instructions given to you at your visit today.  Please pick up your prep supplies at the pharmacy within the next 1-3 days. If you use inhalers (even only as needed), please bring them with you on the day of your procedure. Your physician has requested that you go to www.startemmi.com and enter the access code given to you at your visit today. This web site gives a general overview about your procedure. However, you should still follow specific instructions given to you by our office regarding your preparation for the procedure.  Stay off Aspirin or any blood thinners.   Your physician has requested that you go to the basement for the following lab work before leaving today: CBC

## 2015-12-26 NOTE — Progress Notes (Signed)
Subjective:    Patient ID: Johnn Hai, male    DOB: [birth year], 75 y.o.   MRN: [id number]  HPI  Rani is a pleasant 75 year old white male referred by primary care/William Cyndi Lennert. He had a recent admission 820 through 12/09/2015 with a lower GI bleed. He was not seen by GI during that admission. He had hemoglobin drifted from 13-11.5. Bleeding was felt likely diverticular. Patient has not had any colon evaluation for many years. He did have a colonoscopy in 1992 per Dr. Kennis Carina and was noted to have diffuse diverticulosis with a left-sided predominance. Patient says that he initially had onset of bleeding after he had been constipated and had a large hard stool. After onset he continued to bleed though for 3-4 days and had red and maroonish type stool.. No complaints of abdominal pain cramping or rectal discomfort. He had not had any further bleeding until yesterday but says he had been constipated despite taking MiraLAX magnesia etc. He did have a bowel movement yesterday not particularly hard but did notice red blood at the bowel movement and in the commode. He has not had any further bleeding since. He does admit to taking Aleve and BC powders. He had been taking aspirin , and BCs on a regular basis but that was stopped a month or so ago. He has a lot of back pain and also has been having headaches. He admits that he took a [facility] powder yesterday but is trying not to take them regularly. He is undergoing evaluation with neurosurgery Dr. Maryjean Ka and has been getting injections in his spine which have been helpful and are decreasing his headaches. Other medical problems include dilatation secondary to his spinal disease he's able to ambulate minimally and usually is in a wheelchair. He has peripheral neuropathy, COPD, congestive heart failure with EF of 50-55%.  Review of Systems Pertinent positive and negative review of systems were noted in the above HPI section.  All other review of  systems was otherwise negative.  Outpatient Encounter Prescriptions as of 12/26/2015  Medication Sig  . colchicine 0.6 MG tablet Take 0.6 mg by mouth as needed (for gout).   . cyclobenzaprine (FLEXERIL) 10 MG tablet Take 10 mg by mouth 3 (three) times daily as needed for muscle spasms.  . diazepam (VALIUM) 10 MG tablet Take 5 mg by mouth 2 (two) times daily as needed (for muscle spasms).   . Multiple Vitamins-Minerals (CENTRUM SILVER ADULT 50+ PO) Take 1 tablet by mouth daily.  . naproxen sodium (ANAPROX) 220 MG tablet Take 220 mg by mouth 2 (two) times daily as needed.  . niacin 500 MG tablet Take 500 mg by mouth every morning.   . Omega-3 Fatty Acids (FISH OIL) 1000 MG CAPS Take 2,000 mg by mouth daily.   Marland Kitchen omeprazole (PRILOSEC) 20 MG capsule Take 1 capsule (20 mg total) by mouth every morning.  Vladimir Faster Glycol-Propyl Glycol (SYSTANE OP) Place 2 drops into both eyes at bedtime.  . Probiotic Product (PROBIOTIC PO) Take 1 capsule by mouth daily.  . sertraline (ZOLOFT) 25 MG tablet TAKE 1 TABLET BY MOUTH DAILY (Patient taking differently: TAKE 1 TABLET (25 mg) BY MOUTH DAILY)  . tamsulosin (FLOMAX) 0.4 MG CAPS capsule Take 2 capsules (0.8 mg total) by mouth daily.  . Na Sulfate-K Sulfate-Mg Sulf 17.5-3.13-1.6 GM/180ML SOLN Take 1 kit by mouth as directed.   No facility-administered encounter medications on file as of 12/26/2015.    Allergies  Allergen Reactions  .  Hydromorphone Itching  . Oxycodone Itching and Nausea Only   Patient Active Problem List   Diagnosis Date Noted  . UTI (urinary tract infection) 12/09/2015  . Rectal bleed 12/07/2015  . Chronic diastolic CHF (congestive heart failure) ([place]) 12/07/2015  . Rectal bleeding 12/07/2015  . Abnormality of gait 10/29/2015  . 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 Weakness of both legs 07/27/2015  . Shortness of breath on exertion 07/24/2015  . Tremor 07/24/2015  . Acute stress reaction 07/24/2015  . Screening,  ischemic heart disease 01/29/2015  . Hernia of abdominal cavity 12/20/2014  . Gastroesophageal reflux disease without esophagitis 09/03/2014  . Depression with anxiety 09/03/2014  . BPH (benign prostatic hyperplasia) 09/03/2014  . Gout, chronic 09/03/2014  . Spondylolisthesis at L4-L5 level 07/22/2014  . Diastolic dysfunction with heart failure ([place]) 01/01/2014  . Peripheral neuropathy ([place]) 01/01/2014  . DOE (dyspnea on exertion) 11/15/2013  . Leg edema 11/15/2013  . Snoring 11/15/2013  . COPD pfts pending  05/10/2013   Social History   Social History  . Marital status: Married    Spouse name: N/A  . Number of children: 3  . Years of education: 12   Occupational History  . Retired    Social History Main Topics  . Smoking status: Former Smoker    Packs/day: 1.50    Years: 55.00    Types: Cigarettes  . Smokeless tobacco: Current User  . Alcohol use No  . Drug use: No  . Sexual activity: Not Currently   Other Topics Concern  . Not on file   Social History Narrative   Lives at home w/ his wife   Right-handed   Drinks 1-2 sodas per day    Mr. Goyne family history includes Diabetes in his mother; Heart disease in his father; Parkinson's disease in his father.      Objective:    Vitals:   12/26/15 1400  BP: 134/74  Pulse: 70    Physical Exam well-developed older white male, in a wheelchair in no acute distress, accompanied by his wife, both pleasant. 134/74 pulse 70 height 5 foot 6 weight 212 BMI 34. HEENT; nontraumatic normocephalic EOMI PERRLA sclera anicteric, Cardiovascular ;regular rate and rhythm with S1-S2 no murmur or gallop, Pulmonary ;clear bilaterally He has palpable medical records in his back, Abdomen ;soft nontender nondistended bowel sounds are active there is no palpable mass or hepatosplenomegaly, Rectal ;exam not done patient unable to get on exam table, Ext; no clubbing cyanosis or edema skin warm and dry, Neuropsych ;mood and affect  appropriate       Assessment & Plan:   #1 75 yo White male with recent hospitalization with lower GI bleeding. He did not require transfusion, hemoglobin drifted 2 g and was felt most likely diverticular. Patient had not had any further bleeding over the past 3 weeks until yesterday after being constipated at a bowel movement with bright red blood. Etiology of his bleeding is unclear he does have history of diffuse diverticulosis however last colonoscopy was greater than 20 years ago. We'll need to rule out occult colon lesion. #2 constipation #3 debilitation due to spine disease #4 CHF  EF 50-55% #5 COPD #6 peripheral neuropathy #7 mild anemia secondary to recent bleeding  Plan; we'll check CBC today Patient encouraged to use MiraLAX 17 g in 8 ounces of water once or twice daily every day and not when necessary Patient requests to be established with Dr. Scarlette Shorts, his wife who accompanies him today is  a patient of Dr. Blanch Media. We will schedule for colonoscopy with Dr. Henrene Pastor at [facility] due to his inability to ambulate. Procedure discussed in detail with the patient and his wife including risks and benefits and they are agreeable to proceed. Patient and his wife were counseled that should he develop any more significant bleeding in the interim until colonoscopy that he would need ER evaluation. He was also advised to avoid BCs and NSAIDs.   Tramaine Sauls Genia Harold PA-C 12/26/2015   Cc: Brunetta Jeans, PA-C

## 2015-12-27 NOTE — Progress Notes (Signed)
Reviewed.    Agree.

## 2015-12-31 ENCOUNTER — Other Ambulatory Visit: Payer: Self-pay | Admitting: Emergency Medicine

## 2015-12-31 DIAGNOSIS — K5900 Constipation, unspecified: Secondary | ICD-10-CM

## 2015-12-31 DIAGNOSIS — Z8719 Personal history of other diseases of the digestive system: Secondary | ICD-10-CM

## 2016-01-06 ENCOUNTER — Encounter (HOSPITAL_COMMUNITY): Admission: RE | Payer: Self-pay | Source: Ambulatory Visit

## 2016-01-06 ENCOUNTER — Ambulatory Visit (HOSPITAL_COMMUNITY): Admission: RE | Admit: 2016-01-06 | Payer: Medicare Other | Source: Ambulatory Visit | Admitting: Internal Medicine

## 2016-01-06 SURGERY — COLONOSCOPY WITH PROPOFOL
Anesthesia: Monitor Anesthesia Care

## 2016-01-12 ENCOUNTER — Telehealth: Payer: Self-pay | Admitting: Internal Medicine

## 2016-01-12 DIAGNOSIS — M47812 Spondylosis without myelopathy or radiculopathy, cervical region: Secondary | ICD-10-CM | POA: Diagnosis not present

## 2016-01-12 DIAGNOSIS — M542 Cervicalgia: Secondary | ICD-10-CM | POA: Diagnosis not present

## 2016-01-12 NOTE — Telephone Encounter (Signed)
Left message for pt to call back  °

## 2016-01-13 NOTE — Telephone Encounter (Signed)
Spoke with wife and she states that pt has had some more bleeding and they wanted to see if colon could be done sooner. Pt thought colon was 03/02/16, colon is actually 02/10/16. Wife aware of appt dates now and states they should be ok with that date. Wife knows if he has significant bleeding then she should take him to be evaluated in the ER. Wife verbalized understanding, new prep instructions mailed to pt.

## 2016-01-20 ENCOUNTER — Encounter: Payer: Self-pay | Admitting: Physician Assistant

## 2016-01-20 DIAGNOSIS — Z23 Encounter for immunization: Secondary | ICD-10-CM | POA: Diagnosis not present

## 2016-01-24 DIAGNOSIS — M5137 Other intervertebral disc degeneration, lumbosacral region: Secondary | ICD-10-CM | POA: Diagnosis not present

## 2016-02-05 ENCOUNTER — Encounter (HOSPITAL_COMMUNITY): Payer: Self-pay | Admitting: *Deleted

## 2016-02-10 ENCOUNTER — Encounter (HOSPITAL_COMMUNITY)
Admission: RE | Disposition: A | Discharge status: Home / Self Care | Payer: Self-pay | Source: Ambulatory Visit | Attending: Internal Medicine

## 2016-02-10 ENCOUNTER — Ambulatory Visit (HOSPITAL_COMMUNITY)
Admission: RE | Admit: 2016-02-10 | Discharge: 2016-02-10 | Disposition: A | Discharge status: Home / Self Care | Payer: Medicare Other | Source: Ambulatory Visit | Attending: Internal Medicine | Admitting: Internal Medicine

## 2016-02-10 ENCOUNTER — Ambulatory Visit (HOSPITAL_COMMUNITY): Payer: Medicare Other | Admitting: Anesthesiology

## 2016-02-10 ENCOUNTER — Encounter (HOSPITAL_COMMUNITY): Payer: Self-pay | Admitting: *Deleted

## 2016-02-10 DIAGNOSIS — J449 Chronic obstructive pulmonary disease, unspecified: Secondary | ICD-10-CM | POA: Insufficient documentation

## 2016-02-10 DIAGNOSIS — G629 Polyneuropathy, unspecified: Secondary | ICD-10-CM | POA: Diagnosis not present

## 2016-02-10 DIAGNOSIS — K5730 Diverticulosis of large intestine without perforation or abscess without bleeding: Secondary | ICD-10-CM | POA: Insufficient documentation

## 2016-02-10 DIAGNOSIS — N400 Enlarged prostate without lower urinary tract symptoms: Secondary | ICD-10-CM | POA: Insufficient documentation

## 2016-02-10 DIAGNOSIS — K922 Gastrointestinal hemorrhage, unspecified: Secondary | ICD-10-CM | POA: Diagnosis not present

## 2016-02-10 DIAGNOSIS — K921 Melena: Secondary | ICD-10-CM

## 2016-02-10 DIAGNOSIS — K219 Gastro-esophageal reflux disease without esophagitis: Secondary | ICD-10-CM | POA: Insufficient documentation

## 2016-02-10 DIAGNOSIS — F419 Anxiety disorder, unspecified: Secondary | ICD-10-CM | POA: Diagnosis not present

## 2016-02-10 DIAGNOSIS — I509 Heart failure, unspecified: Secondary | ICD-10-CM | POA: Diagnosis not present

## 2016-02-10 DIAGNOSIS — D649 Anemia, unspecified: Secondary | ICD-10-CM | POA: Diagnosis not present

## 2016-02-10 DIAGNOSIS — Z8249 Family history of ischemic heart disease and other diseases of the circulatory system: Secondary | ICD-10-CM | POA: Diagnosis not present

## 2016-02-10 DIAGNOSIS — R51 Headache: Secondary | ICD-10-CM | POA: Diagnosis not present

## 2016-02-10 DIAGNOSIS — I5032 Chronic diastolic (congestive) heart failure: Secondary | ICD-10-CM | POA: Insufficient documentation

## 2016-02-10 DIAGNOSIS — K648 Other hemorrhoids: Secondary | ICD-10-CM | POA: Diagnosis not present

## 2016-02-10 DIAGNOSIS — K5900 Constipation, unspecified: Secondary | ICD-10-CM | POA: Insufficient documentation

## 2016-02-10 DIAGNOSIS — Z87891 Personal history of nicotine dependence: Secondary | ICD-10-CM | POA: Diagnosis not present

## 2016-02-10 DIAGNOSIS — R5381 Other malaise: Secondary | ICD-10-CM | POA: Insufficient documentation

## 2016-02-10 DIAGNOSIS — Z8719 Personal history of other diseases of the digestive system: Secondary | ICD-10-CM

## 2016-02-10 DIAGNOSIS — K5731 Diverticulosis of large intestine without perforation or abscess with bleeding: Secondary | ICD-10-CM

## 2016-02-10 HISTORY — PX: COLONOSCOPY WITH PROPOFOL: SHX5780

## 2016-02-10 HISTORY — DX: Anxiety disorder, unspecified: F41.9

## 2016-02-10 SURGERY — COLONOSCOPY WITH PROPOFOL
Anesthesia: Monitor Anesthesia Care

## 2016-02-10 MED ORDER — ESMOLOL HCL 100 MG/10ML IV SOLN
INTRAVENOUS | Status: AC
Start: 2016-02-10 — End: 2016-02-10
  Filled 2016-02-10: qty 10

## 2016-02-10 MED ORDER — GLYCOPYRROLATE 0.2 MG/ML IV SOSY
PREFILLED_SYRINGE | INTRAVENOUS | Status: AC
  Filled 2016-02-10: qty 3

## 2016-02-10 MED ORDER — SODIUM CHLORIDE 0.9 % IV SOLN: INTRAVENOUS | Status: DC

## 2016-02-10 MED ORDER — PROPOFOL 500 MG/50ML IV EMUL
INTRAVENOUS | Status: DC | PRN
  Administered 2016-02-10: 40 mg via INTRAVENOUS

## 2016-02-10 MED ORDER — LACTATED RINGERS IV SOLN
INTRAVENOUS | Status: DC
  Administered 2016-02-10: 10:00:00 via INTRAVENOUS
  Administered 2016-02-10: 1000 mL via INTRAVENOUS

## 2016-02-10 MED ORDER — PROPOFOL 500 MG/50ML IV EMUL
INTRAVENOUS | Status: DC | PRN
  Administered 2016-02-10: 150 ug/kg/min via INTRAVENOUS

## 2016-02-10 MED ORDER — PROPOFOL 10 MG/ML IV BOLUS
INTRAVENOUS | Status: AC
  Filled 2016-02-10: qty 40

## 2016-02-10 MED ORDER — GLYCOPYRROLATE 0.2 MG/ML IJ SOLN
INTRAMUSCULAR | Status: DC | PRN
  Administered 2016-02-10: 0.2 mg via INTRAVENOUS

## 2016-02-10 MED ORDER — PROPOFOL 10 MG/ML IV BOLUS
INTRAVENOUS | Status: AC
  Filled 2016-02-10: qty 20

## 2016-02-10 SURGICAL SUPPLY — 22 items

## 2016-02-10 NOTE — Anesthesia Preprocedure Evaluation (Signed)
Anesthesia Evaluation  Patient identified by MRN, date of birth, ID band Patient awake    Reviewed: Allergy & Precautions, NPO status , Patient's Chart, lab work & pertinent test results  Airway Mallampati: II  TM Distance: >3 FB Neck ROM: Full    Dental no notable dental hx.    Pulmonary pneumonia, COPD, former smoker,    Pulmonary exam normal breath sounds clear to auscultation       Cardiovascular +CHF and + DOE  Normal cardiovascular exam Rhythm:Regular Rate:Normal  ECHO 11-27-13:  Impressions:  - Normal LV function; grade 1 diastolic dysfunction; mild MR and TR; trace AI; mildly elevated pulmonary pressures.   Neuro/Psych  Headaches, PSYCHIATRIC DISORDERS Anxiety  Neuromuscular disease    GI/Hepatic Neg liver ROS, GERD  ,  Endo/Other  negative endocrine ROS  Renal/GU negative Renal ROS  negative genitourinary   Musculoskeletal  (+) Arthritis ,   Abdominal (+) + obese,   Peds negative pediatric ROS (+)  Hematology negative hematology ROS (+)   Anesthesia Other Findings   Reproductive/Obstetrics negative OB ROS                             Anesthesia Physical Anesthesia Plan  ASA: III  Anesthesia Plan: MAC   Post-op Pain Management:    Induction: Intravenous  Airway Management Planned: Natural Airway  Additional Equipment:   Intra-op Plan:   Post-operative Plan:   Informed Consent: I have reviewed the patients History and Physical, chart, labs and discussed the procedure including the risks, benefits and alternatives for the proposed anesthesia with the patient or authorized representative who has indicated his/her understanding and acceptance.   Dental advisory given  Plan Discussed with: CRNA  Anesthesia Plan Comments:         Anesthesia Quick Evaluation

## 2016-02-10 NOTE — Anesthesia Postprocedure Evaluation (Signed)
Anesthesia Post Note  Patient: Evan [name]  Procedure(s) Performed: Procedure(s) (LRB): COLONOSCOPY WITH PROPOFOL (N/A)  Patient location during evaluation: PACU Anesthesia Type: MAC Level of consciousness: awake and alert Pain management: pain level controlled Vital Signs Assessment: post-procedure vital signs reviewed and stable Respiratory status: spontaneous breathing, nonlabored ventilation, respiratory function stable and patient connected to nasal cannula oxygen Cardiovascular status: stable and blood pressure returned to baseline Anesthetic complications: no    Last Vitals:  Vitals:   02/10/16 0900 02/10/16 1018  BP: 137/66   Pulse:  66  Resp: 14 14  Temp: 36.5 C     Last Pain:  Vitals:   02/10/16 0900  TempSrc: Oral                 Aayat Hajjar J

## 2016-02-10 NOTE — Transfer of Care (Signed)
Immediate Anesthesia Transfer of Care Note  Patient: Evan [name]  Procedure(s) Performed: Procedure(s): COLONOSCOPY WITH PROPOFOL (N/A)  Patient Location: PACU  Anesthesia Type:MAC  Level of Consciousness:  sedated, patient cooperative and responds to stimulation  Airway & Oxygen Therapy:Patient Spontanous Breathing and Patient connected to face mask oxgen  Post-op Assessment:  Report given to PACU RN and Post -op Vital signs reviewed and stable  Post vital signs:  Reviewed and stable  Last Vitals:  Vitals:   02/10/16 0900  BP: 137/66  Resp: 14  Temp: 36.5 C    Complications: No apparent anesthesia complications

## 2016-02-10 NOTE — Discharge Instructions (Signed)
YOU HAD AN ENDOSCOPIC PROCEDURE TODAY: Refer to the procedure report and other information in the discharge instructions given to you for any specific questions about what was found during the examination. If this information does not answer your questions, please call [place] office at [phone] to clarify.  ° °YOU SHOULD EXPECT: Some feelings of bloating in the abdomen. Passage of more gas than usual. Walking can help get rid of the air that was put into your GI tract during the procedure and reduce the bloating. If you had a lower endoscopy (such as a colonoscopy or flexible sigmoidoscopy) you may notice spotting of blood in your stool or on the toilet paper. Some abdominal soreness may be present for a day or two, also. ° °DIET: Your first meal following the procedure should be a light meal and then it is ok to progress to your normal diet. A half-sandwich or bowl of soup is an example of a good first meal. Heavy or fried foods are harder to digest and may make you feel nauseous or bloated. Drink plenty of fluids but you should avoid alcoholic beverages for 24 hours. If you had a esophageal dilation, please see attached instructions for diet.   ° °ACTIVITY: Your care partner should take you home directly after the procedure. You should plan to take it easy, moving slowly for the rest of the day. You can resume normal activity the day after the procedure however YOU SHOULD NOT DRIVE, use power tools, machinery or perform tasks that involve climbing or major physical exertion for 24 hours (because of the sedation medicines used during the test).  ° °SYMPTOMS TO REPORT IMMEDIATELY: °A gastroenterologist can be reached at any hour. Please call [phone]  for any of the following symptoms:  °Following lower endoscopy (colonoscopy, flexible sigmoidoscopy) °Excessive amounts of blood in the stool  °Significant tenderness, worsening of abdominal pains  °Swelling of the abdomen that is new, acute  °Fever of 100° or  higher  °Following upper endoscopy (EGD, EUS, ERCP, esophageal dilation) °Vomiting of blood or coffee ground material  °New, significant abdominal pain  °New, significant chest pain or pain under the shoulder blades  °Painful or persistently difficult swallowing  °New shortness of breath  °Black, tarry-looking or red, bloody stools ° °FOLLOW UP:  °If any biopsies were taken you will be contacted by phone or by letter within the next 1-3 weeks. Call [phone]  if you have not heard about the biopsies in 3 weeks.  °Please also call with any specific questions about appointments or follow up tests. ° °

## 2016-02-10 NOTE — Op Note (Signed)
[facility] Patient Name: Evan [name] Procedure Date: 02/10/2016 MRN: [id number] Attending MD: Wilhemina Bonito. Marina Goodell , MD Date of Birth: 1941-01-01 CSN: [id number] Age: 75 Admit Type: Outpatient Procedure:                Colonoscopy, to ascending colon Indications:              Hematochezia Providers:                Wilhemina Bonito. Marina Goodell, MD, Anthony Sar, RN, Lorenda Ishihara,                            Technician, Greig Right, CRNA Referring MD:              Medicines:                Monitored Anesthesia Care Complications:            No immediate complications. Estimated blood loss:                            None. Estimated Blood Loss:     Estimated blood loss: none. Procedure:                Pre-Anesthesia Assessment:                           - Prior to the procedure, a History and Physical                            was performed, and patient medications and                            allergies were reviewed. The patient's tolerance of                            previous anesthesia was also reviewed. The risks                            and benefits of the procedure and the sedation                            options and risks were discussed with the patient.                            All questions were answered, and informed consent                            was obtained. Prior Anticoagulants: The patient has                            taken no previous anticoagulant or antiplatelet                            agents. ASA Grade Assessment: III - A patient with  severe systemic disease. After reviewing the risks                            and benefits, the patient was deemed in                            satisfactory condition to undergo the procedure.                           After obtaining informed consent, the colonoscope                            was passed under direct vision. Throughout the                            procedure, the  patient's blood pressure, pulse, and                            oxygen saturations were monitored continuously. The                            EC-[id number] ([id number]) scope was introduced through                            the anus and advanced to the the ascending colon                            for evaluation , but not further secondary to poor                            bowel preparation. The rectum was photographed. The                            quality of the bowel preparation was poor. The                            colonoscopy was performed without difficulty. The                            patient tolerated the procedure well. The bowel                            preparation used was SUPREP poor. Scope In: 10:00:25 AM Scope Out: 10:09:29 AM Total Procedure Duration: 0 hours 9 minutes 4 seconds  Findings:      Multiple large-mouthed diverticula were found in the entire colon.      Internal hemorrhoids were found during retroflexion.      The examination was compromised by poor preparation, but no lumen       occluding a large bulky lesions identified.The exam was otherwise       without abnormality on direct and retroflexion views. Impression:               - Preparation of the colon was poor.                           -  Diverticulosis in the entire examined colon.                           - Internal hemorrhoids.                           - Clinical impression coupled with limited                            colonoscopy consistent with self-limited                            diverticular bleed. Moderate Sedation:      none Recommendation:           - Do not recommend repeat examination in this                            patient.                           - Recommend daily fiber supplementation such as                            Metamucil for hemorrhoids and diverticular disease.                           - Preparation H as needed for hemorrhoids.                           -  Continue present medications. Return to the care                            of your primary provider. Procedure Code(s):        --- Professional ---                           [id number], 53, Colonoscopy, flexible; diagnostic,                            including collection of specimen(s) by brushing or                            washing, when performed (separate procedure) Diagnosis Code(s):        --- Professional ---                           K64.8, Other hemorrhoids                           K92.1, Melena (includes Hematochezia)                           K57.30, Diverticulosis of large intestine without                            perforation or abscess without bleeding CPT copyright 2016 American Medical Association. All rights reserved.  The codes documented in this report are preliminary and upon coder review may  be revised to meet current compliance requirements. Wilhemina Bonito. Marina Goodell, MD 02/10/2016 10:19:11 AM This report has been signed electronically. Number of Addenda: 0

## 2016-02-10 NOTE — H&P (Signed)
Patient ID: Evan [name], male    DOB: [birth year], 75 y.o.   MRN: [id number]  HPI  Evan [name] is a pleasant 75 year old white male referred by primary care/Evan [name]. He had a recent admission 820 through 12/09/2015 with a lower GI bleed. He was not seen by GI during that admission. He had hemoglobin drifted from 13-11.5. Bleeding was felt likely diverticular. Patient has not had any colon evaluation for many years. He did have a colonoscopy in 1992 per Evan [name] and was noted to have diffuse diverticulosis with a left-sided predominance. Patient says that he initially had onset of bleeding after he had been constipated and had a large hard stool. After onset he continued to bleed though for 3-4 days and had red and maroonish type stool.. No complaints of abdominal pain cramping or rectal discomfort. He had not had any further bleeding until yesterday but says he had been constipated despite taking MiraLAX magnesia etc. He did have a bowel movement yesterday not particularly hard but did notice red blood at the bowel movement and in the commode. He has not had any further bleeding since. He does admit to taking Aleve and BC powders. He had been taking aspirin , and BCs on a regular basis but that was stopped a month or so ago. He has a lot of back pain and also has been having headaches. He admits that he took a [facility] powder yesterday but is trying not to take them regularly. He is undergoing evaluation with neurosurgery Dr. Maryjean [name] and has been getting injections in his spine which have been helpful and are decreasing his headaches. Other medical problems include dilatation secondary to his spinal disease he's able to ambulate minimally and usually is in a wheelchair. He has peripheral neuropathy, COPD, congestive heart failure with EF of 50-55%.  Review of Systems Pertinent positive and negative review of systems were noted in the above HPI section.  All other review of systems was  otherwise negative.      Outpatient Encounter Prescriptions as of 12/26/2015  Medication Sig  . colchicine 0.6 MG tablet Take 0.6 mg by mouth as needed (for gout).   . cyclobenzaprine (FLEXERIL) 10 MG tablet Take 10 mg by mouth 3 (three) times daily as needed for muscle spasms.  . diazepam (VALIUM) 10 MG tablet Take 5 mg by mouth 2 (two) times daily as needed (for muscle spasms).   . Multiple Vitamins-Minerals (CENTRUM SILVER ADULT 50+ PO) Take 1 tablet by mouth daily.  . naproxen sodium (ANAPROX) 220 MG tablet Take 220 mg by mouth 2 (two) times daily as needed.  . niacin 500 MG tablet Take 500 mg by mouth every morning.   . Omega-3 Fatty Acids (FISH OIL) 1000 MG CAPS Take 2,000 mg by mouth daily.   Marland Kitchen omeprazole (PRILOSEC) 20 MG capsule Take 1 capsule (20 mg total) by mouth every morning.  Vladimir Faster Glycol-Propyl Glycol (SYSTANE OP) Place 2 drops into both eyes at bedtime.  . Probiotic Product (PROBIOTIC PO) Take 1 capsule by mouth daily.  . sertraline (ZOLOFT) 25 MG tablet TAKE 1 TABLET BY MOUTH DAILY (Patient taking differently: TAKE 1 TABLET (25 mg) BY MOUTH DAILY)  . tamsulosin (FLOMAX) 0.4 MG CAPS capsule Take 2 capsules (0.8 mg total) by mouth daily.  . Na Sulfate-K Sulfate-Mg Sulf 17.5-3.13-1.6 GM/180ML SOLN Take 1 kit by mouth as directed.   No facility-administered encounter medications on file as of 12/26/2015.        Allergies  Allergen  Reactions  . Hydromorphone Itching  . Oxycodone Itching and Nausea Only       Patient Active Problem List   Diagnosis Date Noted  . UTI (urinary tract infection) 12/09/2015  . Rectal bleed 12/07/2015  . Chronic diastolic CHF (congestive heart failure) ([place]) 12/07/2015  . Rectal bleeding 12/07/2015  . Abnormality of gait 10/29/2015  . 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 Weakness of both legs 07/27/2015  . Shortness of breath on exertion 07/24/2015  . Tremor 07/24/2015  . Acute stress reaction 07/24/2015  .  Screening, ischemic heart disease 01/29/2015  . Hernia of abdominal cavity 12/20/2014  . Gastroesophageal reflux disease without esophagitis 09/03/2014  . Depression with anxiety 09/03/2014  . BPH (benign prostatic hyperplasia) 09/03/2014  . Gout, chronic 09/03/2014  . Spondylolisthesis at L4-L5 level 07/22/2014  . Diastolic dysfunction with heart failure ([place]) 01/01/2014  . Peripheral neuropathy ([place]) 01/01/2014  . DOE (dyspnea on exertion) 11/15/2013  . Leg edema 11/15/2013  . Snoring 11/15/2013  . COPD pfts pending  05/10/2013   Social History        Social History  . Marital status: Married    Spouse name: N/A  . Number of children: 3  . Years of education: 12       Occupational History  . Retired         Social History Main Topics  . Smoking status: Former Smoker    Packs/day: 1.50    Years: 55.00    Types: Cigarettes  . Smokeless tobacco: Current User  . Alcohol use No  . Drug use: No  . Sexual activity: Not Currently       Other Topics Concern  . Not on file      Social History Narrative   Lives at home w/ his wife   Right-handed   Drinks 1-2 sodas per day    Evan [name] family history includes Diabetes in his mother; Heart disease in his father; Parkinson's disease in his father.      Objective:       Vitals:   12/26/15 1400  BP: 134/74  Pulse: 70    Physical Exam well-developed older white male, in a wheelchair in no acute distress, accompanied by his wife, both pleasant. 134/74 pulse 70 height 5 foot 6 weight 212 BMI 34. HEENT; nontraumatic normocephalic EOMI PERRLA sclera anicteric, Cardiovascular ;regular rate and rhythm with S1-S2 no murmur or gallop, Pulmonary ;clear bilaterally He has palpable medical records in his back, Abdomen ;soft nontender nondistended bowel sounds are active there is no palpable mass or hepatosplenomegaly, Rectal ;exam not done patient unable to get on exam table, Ext; no clubbing  cyanosis or edema skin warm and dry, Neuropsych ;mood and affect appropriate       Assessment & Plan:   #1 75 yo White male with recent hospitalization with lower GI bleeding. He did not require transfusion, hemoglobin drifted 2 g and was felt most likely diverticular. Patient had not had any further bleeding over the past 3 weeks until yesterday after being constipated at a bowel movement with bright red blood. Etiology of his bleeding is unclear he does have history of diffuse diverticulosis however last colonoscopy was greater than 20 years ago. We'll need to rule out occult colon lesion. #2 constipation #3 debilitation due to spine disease #4 CHF  EF 50-55% #5 COPD #6 peripheral neuropathy #7 mild anemia secondary to recent bleeding  Plan; we'll check CBC today Patient encouraged to use MiraLAX 17 g  in 8 ounces of water once or twice daily every day and not when necessary Patient requests to be established with Dr. Scarlette Shorts, his wife who accompanies him today is a patient of Dr. Blanch Media. We will schedule for colonoscopy with Dr. Henrene Pastor at [facility] due to his inability to ambulate. Procedure discussed in detail with the patient and his wife including risks and benefits and they are agreeable to proceed. Patient and his wife were counseled that should he develop any more significant bleeding in the interim until colonoscopy that he would need ER evaluation. He was also advised to avoid BCs and NSAIDs.   Amy S Esterwood PA-C 12/26/2015   Cc: Brunetta Jeans, PA-C  GI ATTENDING  Original consultation note as above. No interval problems. Some minor bleeding with preparation that he feels is secondary to hemorrhoids. Accompanied today by his wife. Interval physical exam unremarkable. Procedure again discussed.The nature of the procedure, as well as the risks, benefits, and alternatives were carefully and thoroughly reviewed with the patient. Ample time for discussion and  questions allowed. The patient understood, was satisfied, and agreed to proceed.  Docia Chuck. Geri Seminole., M.D. [facility] Division of Gastroenterology

## 2016-02-11 ENCOUNTER — Other Ambulatory Visit: Payer: Self-pay | Admitting: Physician Assistant

## 2016-02-11 ENCOUNTER — Encounter (HOSPITAL_COMMUNITY): Payer: Self-pay | Admitting: Internal Medicine

## 2016-02-11 NOTE — Telephone Encounter (Signed)
Patient was changed to Dr. Carmelia RollerWendling as PCP on 02/09/16 in EMR, on 02/03/16 patient was scheduled on PCP [Martin] scheduled as "a [facility]" on Martin's schedule for 02/17/16. This was presented to Administrative Lead [Ebony], who stated that provider had already made her aware. Rx was filled under Daphine DeutscherMartin, who remains patient PCP until transfer appointment had been completed/SLS 10/25

## 2016-02-17 ENCOUNTER — Ambulatory Visit: Payer: Medicare Other | Admitting: Physician Assistant

## 2016-02-17 ENCOUNTER — Telehealth: Payer: Self-pay | Admitting: Family Medicine

## 2016-02-17 NOTE — Telephone Encounter (Signed)
Noted! Thank you

## 2016-02-17 NOTE — Telephone Encounter (Signed)
Donna/ spouse left vm at 10:42 cancelling pt's appt, she says that they are unable to make appt. She will call back to reschedule.

## 2016-02-24 DIAGNOSIS — M5137 Other intervertebral disc degeneration, lumbosacral region: Secondary | ICD-10-CM | POA: Diagnosis not present

## 2016-02-25 ENCOUNTER — Telehealth: Payer: Self-pay | Admitting: Internal Medicine

## 2016-02-25 ENCOUNTER — Telehealth: Payer: Self-pay | Admitting: Family Medicine

## 2016-02-25 NOTE — Telephone Encounter (Signed)
It would not be appropriate for me to empirically treat him over the telephone for non-GI condition. He needs to see his PCP. Furthermore, colonoscopy is not associated with the development of UTI. Sorry

## 2016-02-25 NOTE — Telephone Encounter (Signed)
Spoke with pt and let him know he needs to call his PCP office back, they offered him an appt and he declined. Pt knows he needs to be seen and was instructed to call them back for an appt.

## 2016-02-25 NOTE — Telephone Encounter (Signed)
Patient will need to request from Dr. Lamar SprinklesPerry's office in [facility][where he had colonoscopy], he also No Show for last appointment. Please call patient and inform/SLS 11/08

## 2016-02-25 NOTE — Telephone Encounter (Signed)
Patient informed. 

## 2016-02-25 NOTE — Telephone Encounter (Signed)
Pt had colon done 02/10/16. States that every time he has a colon done he gets a UTI. Reports he is having burning and urgency. Pt called his PCP but he cancelled his appt with PCP 02/17/16 and has not been seen in that office yet. PCP office offered pt an appt but he declined, stated that he was wheelchair bound and could not come in for visit.They instructed him to call GI. States Cipro usually works well for him. Dr. Marina GoodellPerry please advise.

## 2016-02-25 NOTE — Telephone Encounter (Signed)
Patient scheduled with Dr. Carmelia RollerWendling 02/26/16 for 2pm

## 2016-02-25 NOTE — Telephone Encounter (Signed)
Relation to VW:UJWJpt:self Call back [phone] Pharmacy: [facility] [place] - [address] [place] [address] [phone] (Phone) [phone] (Fax)     Reason for call:  Patient states everytime he has a colonscopy it triggers a UTI, patient experiencing burning while urinating and frequent urination.  Offered appointment patient declined patient sates he's wheelchair bound  requesting cipro states augmentin doesn't work, please advise

## 2016-02-26 ENCOUNTER — Encounter: Payer: Self-pay | Admitting: Family Medicine

## 2016-02-26 ENCOUNTER — Ambulatory Visit (INDEPENDENT_AMBULATORY_CARE_PROVIDER_SITE_OTHER): Payer: Medicare Other | Admitting: Family Medicine

## 2016-02-26 VITALS — BP 118/78 | HR 80 | Temp 97.5°F | Ht 66.0 in | Wt 188.0 lb

## 2016-02-26 DIAGNOSIS — N3000 Acute cystitis without hematuria: Secondary | ICD-10-CM | POA: Diagnosis not present

## 2016-02-26 DIAGNOSIS — L89301 Pressure ulcer of unspecified buttock, stage 1: Secondary | ICD-10-CM

## 2016-02-26 MED ORDER — CIPROFLOXACIN HCL 250 MG PO TABS: 250.0000 mg | ORAL_TABLET | Freq: Two times a day (BID) | ORAL | 0 refills | Status: AC

## 2016-02-26 NOTE — Progress Notes (Signed)
Chief Complaint  Patient presents with  . Urinary Tract Infection    Pt reports burning and frequent urination    Evan [name] is a 75 y.o. male here for possible UTI.  Duration: 1 week. Symptoms: urinary frequency, burning with urination Denies: fevers, flank pain, abdominal pain, constipation, blood in urine, incomplete emptying Hx of recurrent UTI? Yes- used to see urology; has not had a UTI in 5 years This is a typical presentation for his UTI's. Denies new sexual partners.  Also mentioned a pressure ulcer on buttock. Noticed for past 2 weeks. Improving on R, still an issue on L. Has been putting a cream on it that has been a little helpful. No openings or drainage. He is in a wheelchair chronically.  ROS:  Constitutional: denies fever GU: As noted in HPI MSK: Denies back pain Abd: Denies constipation or abdominal pain  Past Medical History:  Diagnosis Date  . Abnormality of gait 10/29/2015  . Abrasion of skin    sacral area red area last year or so, dime size occasionally drains pus and breaks open  . Anxiety   . Arthritis    spine  . BPH (benign prostatic hyperplasia)    takes Flomax daily  . Cataract    immature left eye  . Chronic back pain    stenosis  . Diverticulosis   . GERD (gastroesophageal reflux disease)    takes Omeprazole daily  . Gout    takes Allouprinol and Colchicine daily  . Gout, arthritis    feet  . Hard of hearing    does not wear hearing aides  . Hemorrhoids   . History of bronchitis    "long time ago"  . Hyperlipidemia    takes Niacin daily  . Joint pain   . Loss of balance    related to back   . Migraine    r/t neck issues  . Numbness    hands and related to neck  . Peripheral edema    improved now  . Pneumonia 2015  . 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 Diabetes Mother   . Parkinson's disease Father   . Heart disease Father    Social History    Social History  . Marital status: Married  . Number of children: 3  . Years of education: 12   Occupational History  . Retired    Social History Main Topics  . Smoking status: Former Smoker    Packs/day: 1.50    Years: 55.00    Types: Cigarettes  . Smokeless tobacco: Current User  . Alcohol use No  . Drug use: No   Social History Narrative   Lives at home w/ his wife   Right-handed   Drinks 1-2 sodas per day    BP 118/78 (BP Location: Left Arm, Patient Position: Sitting, Cuff Size: Small)   Pulse 80   Temp 97.5 F (36.4 C) (Oral)   Ht 5\' 6"  (1.676 m)   Wt 188 lb (85.3 kg)   SpO2 93%   BMI 30.34 kg/m  General: Awake, alert, appears stated age HEENT: MMM Heart: RRR, no murmurs Lungs: CTAB, normal respiratory effort, no accessory muscle usage Abd: BS+, soft, NT, ND, no masses or organomegaly Skin: Erythema noted along skin over ischial tuberosities b/l, no breaks in the skin or openings MSK: No CVA tenderness, neg Lloyd's sign Psych: Age appropriate judgment and insight  Acute cystitis without hematuria -  Plan: ciprofloxacin (CIPRO) 250 MG tablet  Orders as above. Given hx of UTI, will tx based on symptoms. If no improvement, he needs to come back. Unable to obtain UA today. Ulcer noted without breaks in skin, thus stage 1, needs to offload pressure to the area. Cushion on chair, lying on side in bed, etc.  F/u in 2 weeks to recheck skin. If doing better, OK to cancel appt. The patient voiced understanding and agreement to the plan.  Jilda Rocheicholas Paul Alexandria BayWendling, DO 02/26/16 2:28 PM

## 2016-02-26 NOTE — Progress Notes (Signed)
Pre visit review using our clinic review tool, if applicable. No additional management support is needed unless otherwise documented below in the visit note. 

## 2016-03-02 ENCOUNTER — Ambulatory Visit: Payer: Medicare Other | Admitting: Internal Medicine

## 2016-03-04 ENCOUNTER — Other Ambulatory Visit: Payer: Self-pay | Admitting: Family Medicine

## 2016-03-04 DIAGNOSIS — N3000 Acute cystitis without hematuria: Secondary | ICD-10-CM

## 2016-03-08 ENCOUNTER — Ambulatory Visit (INDEPENDENT_AMBULATORY_CARE_PROVIDER_SITE_OTHER): Payer: Medicare Other | Admitting: Family Medicine

## 2016-03-08 ENCOUNTER — Telehealth: Payer: Self-pay | Admitting: Family Medicine

## 2016-03-08 ENCOUNTER — Encounter: Payer: Self-pay | Admitting: Family Medicine

## 2016-03-08 VITALS — BP 138/76 | HR 55 | Temp 97.8°F | Ht 66.0 in | Wt 189.0 lb

## 2016-03-08 DIAGNOSIS — N3000 Acute cystitis without hematuria: Secondary | ICD-10-CM | POA: Diagnosis not present

## 2016-03-08 LAB — POC URINALSYSI DIPSTICK (AUTOMATED)
Bilirubin, UA: NEGATIVE
GLUCOSE UA: NEGATIVE
Ketones, UA: NEGATIVE
NITRITE UA: NEGATIVE
Protein, UA: NEGATIVE
RBC UA: NEGATIVE
Spec Grav, UA: >=1.03
UROBILINOGEN UA: 0.2
pH, UA: 5

## 2016-03-08 MED ORDER — CIPROFLOXACIN HCL 250 MG PO TABS: 250.0000 mg | ORAL_TABLET | Freq: Two times a day (BID) | ORAL | 0 refills | Status: DC

## 2016-03-08 NOTE — Progress Notes (Signed)
Pre visit review using our clinic review tool, if applicable. No additional management support is needed unless otherwise documented below in the visit note. 

## 2016-03-08 NOTE — Progress Notes (Signed)
Chief Complaint  Patient presents with  . Urinary Tract Infection    Pt reports having UTI x2 weeks with same symptoms    Evan HillockRichard A [name] is a 75 y.o. male here for possible UTI f/u.  Followed up for cystitis when I saw him on 11/9 for dysuria. Tx'd with cipro that did improve, however at day 7, he started to get worse again. Symptoms: burning Denies: abd pain, constipation, frequency, blood, fever. Hx of recurrent UTI? Yes- years ago and used to see urology  ROS:  Constitutional: denies fever GU: As noted in HPI MSK: Denies back pain Abd: Denies constipation or abdominal pain  Past Medical History:  Diagnosis Date  . Abnormality of gait 10/29/2015  . Abrasion of skin    sacral area red area last year or so, dime size occasionally drains pus and breaks open  . Anxiety   . Arthritis    spine  . BPH (benign prostatic hyperplasia)    takes Flomax daily  . Cataract    immature left eye  . Chronic back pain    stenosis  . Diverticulosis   . GERD (gastroesophageal reflux disease)    takes Omeprazole daily  . Gout    takes Allouprinol and Colchicine daily  . Gout, arthritis    feet  . Hard of hearing    does not wear hearing aides  . Hemorrhoids   . History of bronchitis    "long time ago"  . Hyperlipidemia    takes Niacin daily  . Joint pain   . Loss of balance    related to back   . Migraine    r/t neck issues  . Numbness    hands and related to neck  . Peripheral edema    improved now  . Pneumonia 2015  . 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 Diabetes Mother   . Parkinson's disease Father   . Heart disease Father    Social History   Social History  . Marital status: Married  . Number of children: 3  . Years of education: 12   Occupational History  . Retired    Social History Main Topics  . Smoking status: Former Smoker    Packs/day: 1.50    Years: 55.00    Types: Cigarettes   . Smokeless tobacco: Current User  . Alcohol use No  . Drug use: No  . Sexual activity: Not Currently   Social History Narrative   Lives at home w/ his wife   Right-handed   Drinks 1-2 sodas per day    BP 138/76 (BP Location: Right Arm, Patient Position: Sitting, Cuff Size: Small)   Pulse (!) 55   Temp 97.8 F (36.6 C) (Oral)   Ht 5\' 6"  (1.676 m)   Wt 189 lb (85.7 kg)   SpO2 95%   BMI 30.51 kg/m  General: Awake, alert, appears stated age Heart: RRR, no murmurs Lungs: CTAB, normal respiratory effort, no accessory muscle usage Abd: BS+, soft, NT, ND, no masses or organomegaly MSK: No CVA tenderness, neg Lloyd's sign Rectal: Prostate is enlarged, nontender to palpation, no nodules Psych: Age appropriate judgment and insight  Acute cystitis without hematuria - Plan: POCT Urinalysis Dipstick (Automated), ciprofloxacin (CIPRO) 250 MG tablet, Urine Culture  Orders as above. Likelihood of prostatitis low. Tx again, await culture. If he fails to improve on appropriate abx, will have him call urologist or we will refer if  he needs new referral.  F/u in 7 days if symptoms fail to improve, sooner if things worsen. The patient voiced understanding and agreement to the plan.  Jilda Rocheicholas Paul GriswoldWendling, DO 03/08/16 5:03 PM

## 2016-03-08 NOTE — Telephone Encounter (Signed)
Pt was seen on 11/9 for UTI, pt is still having symptoms  and is not feeling any better, pt would like to know if he needs to come back in please call the home number to speak with pt wife.

## 2016-03-08 NOTE — Telephone Encounter (Signed)
Informed Dr. Carmelia RollerWendling of message.  Per Dr.  Carmelia RollerWendling pt will need to come in for follow-up.  Pt will be call to come in for a follow-up appt today.//AB/CMA

## 2016-03-09 NOTE — Telephone Encounter (Signed)
Pt was seen on 03/08/16 and Rx was already sent to pharmacy for Cipro. TL/CMA

## 2016-03-10 ENCOUNTER — Telehealth: Payer: Self-pay | Admitting: *Deleted

## 2016-03-10 LAB — URINE CULTURE: Colony Count: >=100000

## 2016-03-10 MED ORDER — NITROFURANTOIN MONOHYD MACRO 100 MG PO CAPS: ORAL_CAPSULE | ORAL | 0 refills | Status: DC

## 2016-03-10 NOTE — Telephone Encounter (Signed)
Called and spoke with the pt and informed him of recent lab results and note.  Pt verbalized understanding and agreed.  Pt stated that someone called him this morning and told him to stop the Cipro.  Pt stated that he will not be able to get the new prescription until Friday.  Informed the pt to continue on the Cipro until he is able to get the Macrobid.  Pt agreed.  New prescription sent to the pharmacy by e-script.//AB/CMA

## 2016-03-10 NOTE — Telephone Encounter (Signed)
-----   Message from Sharlene DoryNicholas Paul Wendling, DO sent at 03/10/2016  2:43 PM EST ----- Resistant to FQ's. Will call in Macrobid, 7 days. Renal function reviewed and is sound.  AB- please call in Macrobid 100 mg twice daily for 7 days.

## 2016-03-17 NOTE — Telephone Encounter (Signed)
Called and spoke with the pt to see if he was able to pick up the Macrobid on last Friday.  Pt stated that he did pick up the medication,he is taking it, and he has a few more days left to take.  Asked the pt how he was doing?  And he stated that he was feeling a little better,the burning has decreased but not completely gone.  Pt stated that he is still getting up to urinate.  Pt said he will see how he does.  Informed Dr. Carmelia RollerWendling of the message.//AB/CMA

## 2016-03-19 ENCOUNTER — Telehealth: Payer: Self-pay | Admitting: Family Medicine

## 2016-03-19 DIAGNOSIS — R300 Dysuria: Secondary | ICD-10-CM

## 2016-03-19 NOTE — Telephone Encounter (Signed)
OK to place. Dx "dysuria". TY.

## 2016-03-19 NOTE — Telephone Encounter (Signed)
Please advise if ok to move forward with referral. TL/CMA

## 2016-03-19 NOTE — Telephone Encounter (Signed)
Relation to WU:JWJXpt:self Call back number:[phone]   Reason for call:  Patient requesting a urologist referral due to UTI symtoms not improving, patient experiencing burning while urinating and frequent urination, please advise

## 2016-03-19 NOTE — Telephone Encounter (Signed)
Per Dr. Carmelia RollerWendling recommendation O'k to place orders for referral to Urology. I will alert patient to wait for phonecall for scheduling. TL/CMA

## 2016-03-19 NOTE — Telephone Encounter (Signed)
I have alerted patient refrral has been placed. TL/CMA

## 2016-03-23 ENCOUNTER — Other Ambulatory Visit: Payer: Self-pay | Admitting: Emergency Medicine

## 2016-03-23 DIAGNOSIS — R3914 Feeling of incomplete bladder emptying: Secondary | ICD-10-CM | POA: Diagnosis not present

## 2016-03-23 DIAGNOSIS — R300 Dysuria: Secondary | ICD-10-CM | POA: Diagnosis not present

## 2016-03-23 MED ORDER — OMEPRAZOLE 20 MG PO CPDR: 20.0000 mg | DELAYED_RELEASE_CAPSULE | ORAL | 1 refills | Status: DC

## 2016-03-24 ENCOUNTER — Telehealth: Payer: Self-pay | Admitting: Family Medicine

## 2016-03-24 NOTE — Telephone Encounter (Signed)
Please advise if patient needs to come in to be seen. TL/CMA

## 2016-03-24 NOTE — Telephone Encounter (Signed)
Relation to GN:FAOZpt:self Call back number:[phone] Pharmacy: [facility] - [place] - [address] [place] [address] [phone])     Reason for call:  Patient requesting a refill colchicine 0.6 MG tablet, patient states Donalee CitrinGary Cram, MD neuro cant no longer fill medication due to insurance stating medication has to be prescribed by PCP, please advise

## 2016-03-24 NOTE — Telephone Encounter (Signed)
OK to fill for now, I would like him to be scheduled to discuss this further. TY.

## 2016-03-25 DIAGNOSIS — R339 Retention of urine, unspecified: Secondary | ICD-10-CM | POA: Diagnosis not present

## 2016-03-25 DIAGNOSIS — M5137 Other intervertebral disc degeneration, lumbosacral region: Secondary | ICD-10-CM | POA: Diagnosis not present

## 2016-03-25 NOTE — Telephone Encounter (Signed)
Patient scheduled for 04/07/16 with PCP

## 2016-03-25 NOTE — Telephone Encounter (Signed)
Called patient. Advised medication filled. Requested I call him back this afternoon to schedule appointment.

## 2016-03-29 NOTE — Telephone Encounter (Signed)
Please advise 

## 2016-03-29 NOTE — Telephone Encounter (Signed)
Patient called to follow up on Rx for Zoloft. States he is out and has an appointment next week. Needs enough to last until he sees provider.

## 2016-03-29 NOTE — Telephone Encounter (Signed)
OK 

## 2016-03-30 MED ORDER — SERTRALINE HCL 25 MG PO TABS: 25.0000 mg | ORAL_TABLET | Freq: Every day | ORAL | 0 refills | Status: DC

## 2016-03-30 NOTE — Addendum Note (Signed)
Addended by: Kandace BlitzLONG, Jamaiya Tunnell M on: 03/30/2016 08:28 AM   Modules accepted: Orders

## 2016-03-30 NOTE — Telephone Encounter (Addendum)
I have refilled Rx and [facility].  #30 tablets #0 refills until follow up 04/07/2016 TL/CMA

## 2016-04-05 DIAGNOSIS — M47812 Spondylosis without myelopathy or radiculopathy, cervical region: Secondary | ICD-10-CM | POA: Diagnosis not present

## 2016-04-05 DIAGNOSIS — M542 Cervicalgia: Secondary | ICD-10-CM | POA: Diagnosis not present

## 2016-04-07 ENCOUNTER — Ambulatory Visit: Payer: Medicare Other | Admitting: Family Medicine

## 2016-04-15 ENCOUNTER — Ambulatory Visit (INDEPENDENT_AMBULATORY_CARE_PROVIDER_SITE_OTHER): Payer: Medicare Other | Admitting: Family Medicine

## 2016-04-15 ENCOUNTER — Encounter: Payer: Self-pay | Admitting: Family Medicine

## 2016-04-15 ENCOUNTER — Telehealth: Payer: Self-pay | Admitting: Family Medicine

## 2016-04-15 VITALS — BP 140/72 | HR 69 | Temp 98.6°F | Ht 66.0 in

## 2016-04-15 DIAGNOSIS — F3341 Major depressive disorder, recurrent, in partial remission: Secondary | ICD-10-CM | POA: Diagnosis not present

## 2016-04-15 DIAGNOSIS — M1A9XX0 Chronic gout, unspecified, without tophus (tophi): Secondary | ICD-10-CM | POA: Diagnosis not present

## 2016-04-15 NOTE — Progress Notes (Signed)
Pre visit review using our clinic tool,if applicable. No additional management support is needed unless otherwise documented below in the visit note.  

## 2016-04-15 NOTE — Telephone Encounter (Signed)
01/21/15 PR PPPS, SUBSEQ VISIT [G0439] patient scheduled for 07/15/15 at 1pm and follow up with PCP at 2pm

## 2016-04-15 NOTE — Patient Instructions (Addendum)
Foods to AVOID: Red meat, organ meat (liver), lunch meat, seafood (mussels, scallops, anchovies, etc) Alcohol Sugary foods/beverages (diet soft drinks have no link to flares)  Foods to migrate to: Dairy Vegetables Cherries have limited data to suggest they help lower uric acid levels (and prevent flares) Vit C (500 mg daily) may have a modest effect with preventing flares Poultry If you are going to eat red meat, beef and pork may give you less problems than lamb.   Please consider counseling. The medical literature and evidence-based guidelines support it. Contact [phone] to schedule an appointment or inquire about cost/insurance coverage.

## 2016-04-15 NOTE — Progress Notes (Signed)
Chief Complaint  Patient presents with  . Follow-up    Evan HillockRichard A [name] is a 75 y.o. male here for gout.  Currently being treated with colchicine 0.6 mg prn, Allopurinol 300 mg daily- unsure on exact dose. Neurosurgeon has been rx'ing this for him and has not been monitoring levels.  The joint(s) affected include: Great toe on the L Most recent uric acid level is: unknown- no level in our system currently has level Reports compliance with medicine. Side effects of medications: None Is avoiding seafood, sweet/sugary beverages, alcohol, and red meats.  Depression Hx of depression on Zoloft 25 mg daily. Difficult time for him as they just lost son. Does not follow with a counselor. No issues w medication. No thoughts of harming self or others. Not self medicating.  ROS:  MSK: No joint pain Psych: +depression, no anxiety  Past Medical History:  Diagnosis Date  . Abnormality of gait 10/29/2015  . Abrasion of skin    sacral area red area last year or so, dime size occasionally drains pus and breaks open  . Anxiety   . BPH (benign prostatic hyperplasia)    takes Flomax daily  . Cataract    immature left eye  . Chronic back pain    stenosis  . Diverticulosis   . GERD (gastroesophageal reflux disease)    takes Omeprazole daily  . Gout    takes Allouprinol and Colchicine daily  . Hard of hearing    does not wear hearing aides  . Hemorrhoids   . Hyperlipidemia    takes Niacin daily  . Loss of balance    related to back   . Migraine    r/t neck issues  . Numbness    hands and related to neck  . Peripheral edema    improved now  . 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 Diabetes Mother   . Parkinson's disease Father   . Heart disease Father    Social History   Social History  . Marital status: Married  . Number of children: 3  . Years of education: 12   Occupational History  . Retired     Social History Main Topics  . Smoking status: Former Smoker    Packs/day: 1.50    Years: 55.00    Types: Cigarettes  . Smokeless tobacco: Current User  . Alcohol use No  . Drug use: No   Social History Narrative   Lives at home w/ his wife   Right-handed   Drinks 1-2 sodas per day    BP 140/72   Pulse 69   Temp 98.6 F (37 C) (Oral)   Ht 5\' 6"  (1.676 m)   SpO2 97%  Gen: Awake, alert, appears stated age Neck: No masses or asymmetry Heart: RRR, no murmurs, no bruits, no LE edema Lungs: CTAB, no accessory muscle use MSK: no swelling or TTP, normal gait Skin: No erythema or warmth Psych: Age appropriate judgment and insight, nml mood and affect  Chronic gout involving toe of left foot without tophus, unspecified cause  Recurrent major depressive disorder, in partial remission (HCC)  Orders as above. I would like to see what dose of Allopurinol he is on. I think it is appropriate for me to rx this for him if I am to manage his gout. It sounds like Dr. Wynetta Emeryram agrees.  Goal Uric Acid is 6.0. OK with BP given age. Reminded to avoid foods like  alcohol, sweet beverages, red meat, lunch meat, sea food. This was provided in writing in his AVS. Depression has been worsening. I offered to increase his medication dose, however he would like to state his current 25 mg. He is not seeing a counselor and I did recommend he consider seeing one. The contact number to set this up was provided as after visit summary as well. F/u in 3 mo to check BPH and GERD. The patient and his wife voiced understanding and agreement to the plan.  Jilda Rocheicholas Paul RoseauWendling, DO 04/15/16 3:25 PM

## 2016-04-16 LAB — BASIC METABOLIC PANEL
BUN: 22 mg/dL (ref 6–23)
CALCIUM: 8.9 mg/dL (ref 8.4–10.5)
CO2: 29 mEq/L (ref 19–32)
CREATININE: 0.8 mg/dL (ref 0.40–1.50)
Chloride: 104 mEq/L (ref 96–112)
GFR: 100.13 mL/min (ref >60.00)
GLUCOSE: 98 mg/dL (ref 70–99)
Potassium: 4.2 mEq/L (ref 3.5–5.1)
Sodium: 140 mEq/L (ref 135–145)

## 2016-04-16 LAB — URIC ACID: Uric Acid, Serum: 4.2 mg/dL (ref 4.0–7.8)

## 2016-04-23 ENCOUNTER — Telehealth: Payer: Self-pay | Admitting: Family Medicine

## 2016-04-23 NOTE — Telephone Encounter (Signed)
Patient scheduled medicare wellness with Eber JonesCarolyn for 07/14/16 at 1:30pm and following up with PCP at 2pm.

## 2016-04-25 DIAGNOSIS — M5137 Other intervertebral disc degeneration, lumbosacral region: Secondary | ICD-10-CM | POA: Diagnosis not present

## 2016-04-26 ENCOUNTER — Other Ambulatory Visit: Payer: Self-pay | Admitting: Family Medicine

## 2016-04-27 NOTE — Telephone Encounter (Signed)
I have refilled Rx with #90 tablets #0 refills. TL/CMA

## 2016-04-30 ENCOUNTER — Other Ambulatory Visit: Payer: Self-pay | Admitting: Family Medicine

## 2016-04-30 NOTE — Telephone Encounter (Signed)
OK to refill, based on his last uric acid level, I don't think he needs to take this unless he has a flare. TY.

## 2016-04-30 NOTE — Telephone Encounter (Signed)
Received refill request for colchicine 0.6 MG tablet. Last office visit 04/15/16. Is it ok to refill this? Please advise.

## 2016-05-03 ENCOUNTER — Ambulatory Visit: Payer: Medicare Other | Admitting: Diagnostic Neuroimaging

## 2016-05-03 NOTE — Telephone Encounter (Signed)
Refilled rx per Dr. Carmelia RollerWendling recommendations.  #30 tablets #1 refill. Take as needed for flareups.  TL/CMA

## 2016-05-10 DIAGNOSIS — R030 Elevated blood-pressure reading, without diagnosis of hypertension: Secondary | ICD-10-CM | POA: Diagnosis not present

## 2016-05-10 DIAGNOSIS — M542 Cervicalgia: Secondary | ICD-10-CM | POA: Diagnosis not present

## 2016-05-10 DIAGNOSIS — M47812 Spondylosis without myelopathy or radiculopathy, cervical region: Secondary | ICD-10-CM | POA: Diagnosis not present

## 2016-05-12 ENCOUNTER — Other Ambulatory Visit: Payer: Self-pay | Admitting: Emergency Medicine

## 2016-05-12 NOTE — Telephone Encounter (Signed)
I don't mind rx'ing this, but if he sees a different urologist, they should probably resume rx'ing this. TY.

## 2016-05-12 NOTE — Telephone Encounter (Signed)
Patient called stating that his urologist is retiring and cannot do the refills on this. He is requesting that Dr. Carmelia RollerWendling take over the refills of this medication. Please advise  Phone:

## 2016-05-13 MED ORDER — TAMSULOSIN HCL 0.4 MG PO CAPS
0.8000 mg | ORAL_CAPSULE | Freq: Every day | ORAL | 0 refills | Status: DC
Start: 2016-05-13 — End: 2016-08-04

## 2016-05-14 DIAGNOSIS — N323 Diverticulum of bladder: Secondary | ICD-10-CM | POA: Diagnosis not present

## 2016-05-14 DIAGNOSIS — R310 Gross hematuria: Secondary | ICD-10-CM | POA: Diagnosis not present

## 2016-05-14 DIAGNOSIS — R300 Dysuria: Secondary | ICD-10-CM | POA: Diagnosis not present

## 2016-05-14 DIAGNOSIS — N401 Enlarged prostate with lower urinary tract symptoms: Secondary | ICD-10-CM | POA: Diagnosis not present

## 2016-05-24 ENCOUNTER — Other Ambulatory Visit: Payer: Self-pay | Admitting: Family Medicine

## 2016-05-24 NOTE — Telephone Encounter (Signed)
OK to do 6 mo supply, 90 w 1 refill. TY.

## 2016-05-25 ENCOUNTER — Other Ambulatory Visit: Payer: Self-pay | Admitting: Urology

## 2016-05-26 DIAGNOSIS — M5137 Other intervertebral disc degeneration, lumbosacral region: Secondary | ICD-10-CM | POA: Diagnosis not present

## 2016-05-27 ENCOUNTER — Encounter (HOSPITAL_BASED_OUTPATIENT_CLINIC_OR_DEPARTMENT_OTHER): Payer: Self-pay | Admitting: *Deleted

## 2016-05-27 NOTE — Progress Notes (Signed)
SPOKE W/ PT'S WIFE.  NPO AFTER MN.  ARRIVE AT 0900.  NEEDS HG.  WILL TAKE PRILOSEC, ZOLOFT, AND FLOMAX AM DOS W/ SIPS OF WATER.  PT USES WHEELCHAIR DUE BILATERAL LEG WEAKNESS FROM MULTIPLE BACK SURGERIES.  HE CAN STAND AND PIVOT TO STRETCHER.

## 2016-05-28 NOTE — H&P (Signed)
Office Visit Report     05/14/2016   --------------------------------------------------------------------------------   Evan [name]  MRN: [id number]  PRIMARY CARE:  Evan EdgeStacey Blyth, MD  DOB: 11/04/1940, 76 year old Male  REFERRING:    SSN: PROVIDER:  Jethro BolusSigmund Jamariya [name], M.D.    LOCATION:  [facility] [phone]   --------------------------------------------------------------------------------   CC: I have pain or burning with urination.  HPI: Evan [name] is a 76 year-old male established patient who is here for pain or burning while urinating.  He does have pain or burning when he urinates. He first noticed the symptom approximately 02/24/2016.   He does not urinate more frequently than once every 4 hours in the daytime. He does not have to strain or bear down to start his urinary stream. He does not dribble at the end of urination. He does not have an abnormal sensation when needing to urinate. He usually gets up at night to urinate 3 times.   He has not had recurrent prostate infections or chronic prostatitis.   He has previously had an indwelling catheter in for more than two weeks at a time.     CC: Frequency, Nocturia and Urgency  HPI: He generally urinates every 4 hours in the daytime. He cannot sit through a two-hour movie without urinating. The patients frequency is worse later in the day.   He has nocturia 3-4 times per night.   He has returned to self intermittent catheter daily at bedtime.     ALLERGIES: Hydrocodone - Itching    MEDICATIONS: Omeprazole 20 mg tablet, delayed release  Colchicine 0.6 mg capsule  Fish Oil 1000 MG Oral Capsule Oral  Flomax 0.4 mg capsule, ext release 24 hr  Multivitamin  Niacin TABS Oral  Sertraline Hcl 25 mg tablet  Zoloft TABS Oral     GU PSH: Cystoscopy TUNA - about 2012    NON-GU PSH: Back Surgery (Unspecified) Neck Surgery (Unspecified)    GU PMH: Dysuria (Acute), Elevated PVR. Will  have pt RTC for cysto w/Dr. Patsi [name] - 03/23/2016, Dysuria, - 2014 Incomplete bladder emptying (Worsening, Chronic), Pt will begin CIC BID. Discussed placing foley today. Pt refused. New Rx for catheters given for #14 F male - 03/23/2016 Acute Cystitis, Acute cystitis without hematuria - 2014 BPH w/o LUTS, Benign prostatic hypertrophy without lower urinary tract symptoms - 2014 ED, arterial insufficiency, Erectile dysfunction due to arterial insufficiency - 2014 Gross hematuria, Gross hematuria - 2014 Personal Hx urinary calculi, Nephrolithiasis - 2014 Renal Cysts, Simple, Renal cyst, acquired - 2014 Urinary Retention, Unspec, Incomplete bladder emptying - 2014 Urinary Tract Inf, Unspec site, Urinary tract infection - 2014, Pyuria, - 2014      PMH Notes:  2011-07-05 09:11:20 - Note: Urinary Tract Infection  2009-08-20 09:15:52 - Note: Flank Pain Left  bleeding disorder     NON-GU PMH: Personal history of other diseases of the digestive system, History of esophageal reflux - 2014 Arthritis Asthma Encounter for general adult medical examination without abnormal findings, Encounter for preventive health examination GERD Gout    FAMILY HISTORY: Benign Prostatic Hypertrophy - Father Diabetes - Mother Family Health Status Number - Runs In Family Father Deceased At 47ge86 ___ - Runs In Family Heart Attack - Father Mother Deceased At Age 76 from diabetic complicati - Runs In Family Parkinson's Disease - Father   SOCIAL HISTORY: Marital Status: Married Current Smoking Status: Patient does not smoke anymore.  Does not use smokeless tobacco. Has never drank.  Does not  drink caffeine.     Notes: 3 sons    REVIEW OF SYSTEMS:    GU Review Male:   Patient reports hard to postpone urination, burning/ pain with urination, get up at night to urinate, and trouble starting your stream. Patient denies frequent urination, leakage of urine, stream starts and stops, have to strain to urinate ,  erection problems, and penile pain.  Gastrointestinal (Upper):   Patient denies nausea, vomiting, and indigestion/ heartburn.  Gastrointestinal (Lower):   Patient denies diarrhea and constipation.  Constitutional:   Patient denies night sweats, fever, weight loss, and fatigue.  Skin:   Patient denies skin rash/ lesion and itching.  Eyes:   Patient denies blurred vision and double vision.  Ears/ Nose/ Throat:   Patient denies sore throat and sinus problems.  Hematologic/Lymphatic:   Patient denies swollen glands and easy bruising.  Cardiovascular:   Patient reports leg swelling. Patient denies chest pains.  Respiratory:   Patient denies cough and shortness of breath.  Endocrine:   Patient denies excessive thirst.  Musculoskeletal:   Patient reports back pain and joint pain.   Neurological:   Patient reports headaches. Patient denies dizziness.  Psychologic:   Patient denies depression and anxiety.   VITAL SIGNS:      05/14/2016 03:35 PM  BP 138/92 mmHg  Pulse 89 /min  Temperature 98.0 F / 37 C   GU PHYSICAL EXAMINATION:    Prostate: Prostate 2 + size. Left lobe normal consistency, right lobe normal consistency. Symmetrical lobes. No prostate nodule. Left lobe no tenderness, right lobe no tenderness.    MULTI-SYSTEM PHYSICAL EXAMINATION:    Constitutional: Obese. No physical deformities. Normally developed. Good grooming.   Respiratory: No labored breathing, no use of accessory muscles.   Cardiovascular: Normal temperature, normal extremity pulses, no swelling, no varicosities.   Neurologic / Psychiatric: Oriented to time, oriented to place, oriented to person. No depression, no anxiety, no agitation.   Gastrointestinal: Obese abdomen. No mass, no tenderness, no rigidity.      PAST DATA REVIEWED:  Source Of History:  Patient  Records Review:   Previous Patient Records   08/20/09 11/18/04 11/28/02  PSA  Total PSA 3.25  2.36  1.18     03/23/16  Urinalysis  Urine Appearance Clear    Urine Color Yellow   Urine Glucose Neg   Urine Bilirubin Neg   Urine Ketones Neg   Urine Specific Gravity 1.020   Urine Blood Neg   Urine pH 6.0   Urine Protein Neg   Urine Urobilinogen 0.2   Urine Nitrites Neg   Urine Leukocyte Esterase Neg    PROCEDURES:         Flexible Cystoscopy - 52000  Risks, benefits, and some of the potential complications of the procedure were discussed at length with the patient including infection, bleeding, voiding discomfort, urinary retention, fever, chills, sepsis, and others. All questions were answered. Informed consent was obtained. Antibiotic prophylaxis was given. Sterile technique and intraurethral analgesia were used.  Meatus:  Normal size. Normal location. Normal condition.  Urethra:  No strictures.  External Sphincter:  Normal.  Verumontanum:  Normal.  Prostate:  Bilateral enlarged prosthetic lobes.-obstructing.   Bladder Neck:  Non-obstructing.  Ureteral Orifices:  Normal location. Normal size. Normal shape. Effluxed clear urine.  Bladder:  Mild trabeculation. No tumors. Normal mucosa. No stones. Right lateral bladder wall diverticulum, [place].      The lower urinary tract was carefully examined. The procedure was well-tolerated and without complications.  Antibiotic instructions were given. Instructions were given to call the office immediately for bloody urine, difficulty urinating, urinary retention, painful or frequent urination, fever, chills, nausea, vomiting or other illness. The patient stated that he understood these instructions and would comply with them.   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 He will begin trimethoprim 100 mg one by mouth per day,  and continue to self catheter at night. He will have thulium laser vaporization of the prostate, to relieve his obstruction. However, I believe he will continue to have poor drainage of the diverticulum.    PLAN:            Medications New Meds: Trimethoprim 100 mg tablet 1 tablet PO Daily   #30  5 Refill(s)            Schedule Return Visit/Planned Activity: Next Available Appointment - Schedule Surgery             Note: Thulium laser TURP          Document Letter(s):  Created for Patient: Clinical Summary         Notes:   1. Continue SIC 1x/day  2. Tmx 1/day  3. Thulium laser BPH/retention   cc: Dr. Danise [name]    Signed by Evan Bolus, M.D. on 05/14/16 at 8:41 PM (EST)     The information contained in this medical record document is considered private and confidential patient information. This information can only be used for the medical diagnosis and/or medical services that are being provided by the patient's selected caregivers. This information can only be distributed outside of the patient's care if the patient agrees and signs waivers of authorization for this information to be sent to an outside source or route.

## 2016-05-31 ENCOUNTER — Ambulatory Visit (HOSPITAL_BASED_OUTPATIENT_CLINIC_OR_DEPARTMENT_OTHER)
Admission: RE | Admit: 2016-05-31 | Discharge: 2016-05-31 | Disposition: A | Discharge status: Home / Self Care | Payer: Medicare Other | Source: Ambulatory Visit | Attending: Urology | Admitting: Urology

## 2016-05-31 ENCOUNTER — Ambulatory Visit (HOSPITAL_BASED_OUTPATIENT_CLINIC_OR_DEPARTMENT_OTHER): Payer: Medicare Other | Admitting: Anesthesiology

## 2016-05-31 ENCOUNTER — Encounter (HOSPITAL_BASED_OUTPATIENT_CLINIC_OR_DEPARTMENT_OTHER)
Admission: RE | Disposition: A | Discharge status: Home / Self Care | Payer: Self-pay | Source: Ambulatory Visit | Attending: Urology

## 2016-05-31 ENCOUNTER — Encounter (HOSPITAL_BASED_OUTPATIENT_CLINIC_OR_DEPARTMENT_OTHER): Payer: Self-pay | Admitting: Anesthesiology

## 2016-05-31 ENCOUNTER — Telehealth: Payer: Self-pay | Admitting: Family Medicine

## 2016-05-31 DIAGNOSIS — R338 Other retention of urine: Secondary | ICD-10-CM | POA: Insufficient documentation

## 2016-05-31 DIAGNOSIS — Z79899 Other long term (current) drug therapy: Secondary | ICD-10-CM | POA: Insufficient documentation

## 2016-05-31 DIAGNOSIS — N400 Enlarged prostate without lower urinary tract symptoms: Secondary | ICD-10-CM | POA: Diagnosis not present

## 2016-05-31 DIAGNOSIS — N401 Enlarged prostate with lower urinary tract symptoms: Secondary | ICD-10-CM | POA: Insufficient documentation

## 2016-05-31 DIAGNOSIS — Z87891 Personal history of nicotine dependence: Secondary | ICD-10-CM | POA: Insufficient documentation

## 2016-05-31 DIAGNOSIS — N390 Urinary tract infection, site not specified: Secondary | ICD-10-CM | POA: Diagnosis not present

## 2016-05-31 DIAGNOSIS — Z885 Allergy status to narcotic agent status: Secondary | ICD-10-CM | POA: Diagnosis not present

## 2016-05-31 DIAGNOSIS — J449 Chronic obstructive pulmonary disease, unspecified: Secondary | ICD-10-CM | POA: Diagnosis not present

## 2016-05-31 DIAGNOSIS — I5032 Chronic diastolic (congestive) heart failure: Secondary | ICD-10-CM | POA: Diagnosis not present

## 2016-05-31 HISTORY — DX: Emphysema, unspecified: J43.9

## 2016-05-31 HISTORY — DX: Complete loss of teeth, unspecified cause, unspecified class: K08.109

## 2016-05-31 HISTORY — DX: Other specified health status: Z78.9

## 2016-05-31 HISTORY — DX: Chronic gout, unspecified, without tophus (tophi): M1A.9XX0

## 2016-05-31 HISTORY — PX: TRANSURETHRAL RESECTION OF PROSTATE: SHX73

## 2016-05-31 HISTORY — DX: Other symptoms and signs involving the musculoskeletal system: R29.898

## 2016-05-31 HISTORY — DX: Presence of dental prosthetic device (complete) (partial): Z97.2

## 2016-05-31 HISTORY — DX: Unspecified osteoarthritis, unspecified site: M19.90

## 2016-05-31 HISTORY — DX: Diverticulosis of large intestine without perforation or abscess without bleeding: K57.30

## 2016-05-31 HISTORY — DX: Spondylosis without myelopathy or radiculopathy, cervical region: M47.812

## 2016-05-31 LAB — POCT I-STAT, CHEM 8
BUN: 23 mg/dL — AB (ref 6–20)
CALCIUM ION: 1.25 mmol/L (ref 1.15–1.40)
CHLORIDE: 102 mmol/L (ref 101–111)
Creatinine, Ser: 0.9 mg/dL (ref 0.61–1.24)
GLUCOSE: 109 mg/dL — AB (ref 65–99)
HCT: 40 % (ref 39.0–52.0)
Hemoglobin: 13.6 g/dL (ref 13.0–17.0)
POTASSIUM: 4.2 mmol/L (ref 3.5–5.1)
Sodium: 140 mmol/L (ref 135–145)
TCO2: 27 mmol/L (ref 0–100)

## 2016-05-31 SURGERY — TURP (TRANSURETHRAL RESECTION OF PROSTATE)
Anesthesia: General

## 2016-05-31 MED ORDER — DEXTROSE 5 % IV SOLN
3.0000 g | INTRAVENOUS | Status: DC
  Filled 2016-05-31: qty 3000

## 2016-05-31 MED ORDER — FENTANYL CITRATE (PF) 100 MCG/2ML IJ SOLN
INTRAMUSCULAR | Status: AC
  Filled 2016-05-31: qty 2

## 2016-05-31 MED ORDER — LIDOCAINE 2% (20 MG/ML) 5 ML SYRINGE
INTRAMUSCULAR | Status: AC
  Filled 2016-05-31: qty 5

## 2016-05-31 MED ORDER — EPHEDRINE 5 MG/ML INJ
INTRAVENOUS | Status: AC
  Filled 2016-05-31: qty 10

## 2016-05-31 MED ORDER — FLUORESCEIN SODIUM 10 % IV SOLN
INTRAVENOUS | Status: AC
  Filled 2016-05-31: qty 5

## 2016-05-31 MED ORDER — KETOROLAC TROMETHAMINE 15 MG/ML IJ SOLN
15.0000 mg | INTRAMUSCULAR | Status: DC
  Filled 2016-05-31: qty 1

## 2016-05-31 MED ORDER — PROMETHAZINE HCL 25 MG/ML IJ SOLN
6.2500 mg | INTRAMUSCULAR | Status: DC | PRN
  Filled 2016-05-31: qty 1

## 2016-05-31 MED ORDER — DEXAMETHASONE SODIUM PHOSPHATE 4 MG/ML IJ SOLN
INTRAMUSCULAR | Status: DC | PRN
  Administered 2016-05-31: 10 mg via INTRAVENOUS

## 2016-05-31 MED ORDER — CEFAZOLIN SODIUM-DEXTROSE 2-4 GM/100ML-% IV SOLN
INTRAVENOUS | Status: AC
  Filled 2016-05-31: qty 100

## 2016-05-31 MED ORDER — PROPOFOL 10 MG/ML IV BOLUS
INTRAVENOUS | Status: AC
  Filled 2016-05-31: qty 20

## 2016-05-31 MED ORDER — DEXAMETHASONE SODIUM PHOSPHATE 10 MG/ML IJ SOLN
INTRAMUSCULAR | Status: AC
  Filled 2016-05-31: qty 1

## 2016-05-31 MED ORDER — MEPERIDINE HCL 25 MG/ML IJ SOLN
6.2500 mg | INTRAMUSCULAR | Status: DC | PRN
  Filled 2016-05-31: qty 1

## 2016-05-31 MED ORDER — ACETAMINOPHEN 500 MG PO TABS
ORAL_TABLET | ORAL | Status: AC
  Filled 2016-05-31: qty 2

## 2016-05-31 MED ORDER — CEFAZOLIN SODIUM-DEXTROSE 2-4 GM/100ML-% IV SOLN
2.0000 g | INTRAVENOUS | Status: AC
  Administered 2016-05-31: 2 g via INTRAVENOUS
  Filled 2016-05-31: qty 100

## 2016-05-31 MED ORDER — HYDROCODONE-ACETAMINOPHEN 5-325 MG PO TABS: 1.0000 | ORAL_TABLET | ORAL | 0 refills | Status: DC | PRN

## 2016-05-31 MED ORDER — LACTATED RINGERS IV SOLN
INTRAVENOUS | Status: DC
  Administered 2016-05-31: 10:00:00 via INTRAVENOUS
  Filled 2016-05-31: qty 1000

## 2016-05-31 MED ORDER — LIDOCAINE 2% (20 MG/ML) 5 ML SYRINGE
INTRAMUSCULAR | Status: DC | PRN
  Administered 2016-05-31: 80 mg via INTRAVENOUS

## 2016-05-31 MED ORDER — PROPOFOL 10 MG/ML IV BOLUS
INTRAVENOUS | Status: DC | PRN
Start: 2016-05-31 — End: 2016-05-31
  Administered 2016-05-31: 150 mg via INTRAVENOUS

## 2016-05-31 MED ORDER — ONDANSETRON HCL 4 MG/2ML IJ SOLN
INTRAMUSCULAR | Status: DC | PRN
  Administered 2016-05-31: 4 mg via INTRAVENOUS

## 2016-05-31 MED ORDER — ONDANSETRON HCL 4 MG/2ML IJ SOLN
INTRAMUSCULAR | Status: AC
  Filled 2016-05-31: qty 2

## 2016-05-31 MED ORDER — FLUORESCEIN SODIUM 10 % IV SOLN
INTRAVENOUS | Status: DC | PRN
  Administered 2016-05-31: 50 mg via INTRAVENOUS

## 2016-05-31 MED ORDER — LIDOCAINE HCL 2 % EX GEL
CUTANEOUS | Status: AC
  Filled 2016-05-31: qty 5

## 2016-05-31 MED ORDER — MIDAZOLAM HCL 2 MG/2ML IJ SOLN
0.5000 mg | Freq: Once | INTRAMUSCULAR | Status: DC | PRN
  Filled 2016-05-31: qty 2

## 2016-05-31 MED ORDER — FENTANYL CITRATE (PF) 100 MCG/2ML IJ SOLN
25.0000 ug | INTRAMUSCULAR | Status: DC | PRN
  Filled 2016-05-31: qty 1

## 2016-05-31 MED ORDER — ACETAMINOPHEN 500 MG PO TABS
1000.0000 mg | ORAL_TABLET | ORAL | Status: AC
  Administered 2016-05-31: 1000 mg via ORAL
  Filled 2016-05-31: qty 2

## 2016-05-31 MED ORDER — FENTANYL CITRATE (PF) 100 MCG/2ML IJ SOLN
INTRAMUSCULAR | Status: DC | PRN
  Administered 2016-05-31 (×4): 25 ug via INTRAVENOUS

## 2016-05-31 MED ORDER — OMEPRAZOLE 20 MG PO CPDR: 20.0000 mg | DELAYED_RELEASE_CAPSULE | ORAL | 1 refills | Status: DC

## 2016-05-31 MED ORDER — EPHEDRINE SULFATE 50 MG/ML IJ SOLN
INTRAMUSCULAR | Status: DC | PRN
  Administered 2016-05-31 (×2): 10 mg via INTRAVENOUS

## 2016-05-31 SURGICAL SUPPLY — 39 items
BAG DRAIN URO-CYSTO SKYTR STRL (DRAIN) ×3 IMPLANT
BAG DRN ANRFLXCHMBR STRAP LEK (BAG)
BAG DRN UROCATH (DRAIN) ×1
BAG URINE DRAINAGE (UROLOGICAL SUPPLIES) IMPLANT
BAG URINE LEG 19OZ MD ST LTX (BAG) IMPLANT
BLADE SURG 15 STRL LF DISP TIS (BLADE) IMPLANT
BLADE SURG 15 STRL SS (BLADE)
BOOTIES KNEE HIGH SLOAN (MISCELLANEOUS) ×3 IMPLANT
CATH AINSWORTH 30CC 24FR (CATHETERS) IMPLANT
CATH FOLEY 2WAY SLVR  5CC 14FR (CATHETERS)
CATH FOLEY 2WAY SLVR  5CC 20FR (CATHETERS)
CATH FOLEY 2WAY SLVR 5CC 14FR (CATHETERS) IMPLANT
CATH FOLEY 2WAY SLVR 5CC 20FR (CATHETERS) IMPLANT
CATH HEMA 3WAY 30CC 22FR COUDE (CATHETERS) ×2 IMPLANT
CATH HEMA 3WAY 30CC 24FR COUDE (CATHETERS) IMPLANT
CATH HEMA 3WAY 30CC 24FR RND (CATHETERS) IMPLANT
CATH SIMPLASTIC 24 30ML (CATHETERS) IMPLANT
CLOTH BEACON ORANGE TIMEOUT ST (SAFETY) ×3 IMPLANT
ELECT REM PT RETURN 9FT ADLT (ELECTROSURGICAL) ×3
ELECTRODE REM PT RTRN 9FT ADLT (ELECTROSURGICAL) ×1 IMPLANT
GLOVE BIO SURGEON STRL SZ7.5 (GLOVE) ×3 IMPLANT
GOWN STRL REUS W/ TWL LRG LVL3 (GOWN DISPOSABLE) ×1 IMPLANT
GOWN STRL REUS W/ TWL XL LVL3 (GOWN DISPOSABLE) ×1 IMPLANT
GOWN STRL REUS W/TWL LRG LVL3 (GOWN DISPOSABLE) ×3
GOWN STRL REUS W/TWL XL LVL3 (GOWN DISPOSABLE) ×3
HOLDER FOLEY CATH W/STRAP (MISCELLANEOUS) IMPLANT
IV NS IRRIG 3000ML ARTHROMATIC (IV SOLUTION) ×8 IMPLANT
KIT ROOM TURNOVER WOR (KITS) ×3 IMPLANT
LASER REVOLIX PROCEDURE (MISCELLANEOUS) ×2 IMPLANT
MANIFOLD NEPTUNE II (INSTRUMENTS) IMPLANT
PACK CYSTO (CUSTOM PROCEDURE TRAY) ×3 IMPLANT
PLUG CATH AND CAP STER (CATHETERS) IMPLANT
SET ASPIRATION TUBING (TUBING) IMPLANT
SUT ETHILON 3 0 PS 1 (SUTURE) IMPLANT
SUT SILK 0 TIES 10X30 (SUTURE) IMPLANT
SYR 30ML LL (SYRINGE) IMPLANT
SYRINGE IRR TOOMEY STRL 70CC (SYRINGE) ×3 IMPLANT
TUBE CONNECTING 12'X1/4 (SUCTIONS)
TUBE CONNECTING 12X1/4 (SUCTIONS) IMPLANT

## 2016-05-31 NOTE — Telephone Encounter (Signed)
Rx sent to the pharmacy by e-script.  Pt aware.//AB/CMA 

## 2016-05-31 NOTE — Discharge Instructions (Signed)
°Post Anesthesia Home Care Instructions ° °Activity: °Get plenty of rest for the remainder of the day. A responsible adult should stay with you for 24 hours following the procedure.  °For the next 24 hours, DO NOT: °-Drive a car °-Operate machinery °-Drink alcoholic beverages °-Take any medication unless instructed by your physician °-Make any legal decisions or sign important papers. ° °Meals: °Start with liquid foods such as gelatin or soup. Progress to regular foods as tolerated. Avoid greasy, spicy, heavy foods. If nausea and/or vomiting occur, drink only clear liquids until the nausea and/or vomiting subsides. Call your physician if vomiting continues. ° °Special Instructions/Symptoms: °Your throat may feel dry or sore from the anesthesia or the breathing tube placed in your throat during surgery. If this causes discomfort, gargle with warm salt water. The discomfort should disappear within 24 hours. ° °If you had a scopolamine patch placed behind your ear for the management of post- operative nausea and/or vomiting: ° °1. The medication in the patch is effective for 72 hours, after which it should be removed.  Wrap patch in a tissue and discard in the trash. Wash hands thoroughly with soap and water. °2. You may remove the patch earlier than 72 hours if you experience unpleasant side effects which may include dry mouth, dizziness or visual disturbances. °3. Avoid touching the patch. Wash your hands with soap and water after contact with the patch. °  °Foley Catheter Care, Adult °A Foley catheter is a soft, flexible tube. This tube is placed into your bladder to drain pee (urine). If you go home with this catheter in place, follow the instructions below. °TAKING CARE OF THE CATHETER °1. Wash your hands with soap and water. °2. Put soap and water on a clean washcloth. °¨ Clean the skin where the tube goes into your body. °§ Clean away from the tube site. °§ Never wipe toward the tube. °§ Clean the area using a  circular motion. °¨ Remove all the soap. Pat the area dry with a clean towel. For males, reposition the skin that covers the end of the penis (foreskin). °3. Attach the tube to your leg with tape or a leg strap. Do not stretch the tube tight. If you are using tape, remove any stickiness left behind by past tape you used. °4. Keep the drainage bag below your hips. Keep it off the floor. °5. Check your tube during the day. Make sure it is working and draining. Make sure the tube does not curl, twist, or bend. °6. Do not pull on the tube or try to take it out. °TAKING CARE OF THE DRAINAGE BAGS °You will have a large overnight drainage bag and a small leg bag. You may wear the overnight bag any time. Never wear the small bag at night. Follow the directions below. °Emptying the Drainage Bag  °Empty your drainage bag when it is ?-½ full or at least 2-3 times a day. °1. Wash your hands with soap and water. °2. Keep the drainage bag below your hips. °3. Hold the dirty bag over the toilet or clean container. °4. Open the pour spout at the bottom of the bag. Empty the pee into the toilet or container. Do not let the pour spout touch anything. °5. Clean the pour spout with a gauze pad or cotton ball that has rubbing alcohol on it. °6. Close the pour spout. °7. Attach the bag to your leg with tape or a leg strap. °8. Wash your hands well. °Changing   the Drainage Bag  °Change your bag once a month or sooner if it starts to smell or look dirty.  °1. Wash your hands with soap and water. °2. Pinch the rubber tube so that pee does not spill out. °3. Disconnect the catheter tube from the drainage tube at the connection valve. Do not let the tubes touch anything. °4. Clean the end of the catheter tube with an alcohol wipe. Clean the end of a the drainage tube with a different alcohol wipe. °5. Connect the catheter tube to the drainage tube of the clean drainage bag. °6. Attach the new bag to the leg with tape or a leg strap. Avoid  attaching the new bag too tightly. °7. Wash your hands well. °Cleaning the Drainage Bag  °1. Wash your hands with soap and water. °2. Wash the bag in warm, soapy water. °3. Rinse the bag with warm water. °4. Fill the bag with a mixture of white vinegar and water (1 cup vinegar to 1 quart warm water [.2 liter vinegar to 1 liter warm water]). Close the bag and soak it for 30 minutes in the solution. °5. Rinse the bag with warm water. °6. Hang the bag to dry with the pour spout open and hanging downward. °7. Store the clean bag (once it is dry) in a clean plastic bag. °8. Wash your hands well. °PREVENT INFECTION °· Wash your hands before and after touching your tube. °· Take showers every day. Wash the skin where the tube enters your body. Do not take baths. Replace wet leg straps with dry ones, if this applies. °· Do not use powders, sprays, or lotions on the genital area. Only use creams, lotions, or ointments as told by your doctor. °· For females, wipe from front to back after going to the bathroom. °· Drink enough fluids to keep your pee clear or pale yellow unless you are told not to have too much fluid (fluid restriction). °· Do not let the drainage bag or tubing touch or lie on the floor. °· Wear cotton underwear to keep the area dry. °GET HELP IF: °· Your pee is cloudy or smells unusually bad. °· Your tube becomes clogged. °· You are not draining pee into the bag or your bladder feels full. °· Your tube starts to leak. °GET HELP RIGHT AWAY IF: °· You have pain, puffiness (swelling), redness, or yellowish-white fluid (pus) where the tube enters the body. °· You have pain in the belly (abdomen), legs, lower back, or bladder. °· You have a fever. °· You see blood fill the tube, or your pee is pink or red. °· You feel sick to your stomach (nauseous), throw up (vomit), or have chills. °· Your tube gets pulled out. °MAKE SURE YOU:  °· Understand these instructions. °· Will watch your condition. °· Will get help  right away if you are not doing well or get worse. °This information is not intended to replace advice given to you by your health care provider. Make sure you discuss any questions you have with your health care provider. °Document Released: 07/31/2012 Document Revised: 04/26/2014 Document Reviewed: 03/22/2015 °Elsevier Interactive Patient Education © 2017 [employer] ° °

## 2016-05-31 NOTE — Anesthesia Procedure Notes (Signed)
Procedure Name: LMA Insertion Date/Time: 05/31/2016 11:24 AM Performed by: Jessica PriestBEESON, Noeli Lavery C Pre-anesthesia Checklist: Patient identified, Emergency Drugs available, Suction available and Patient being monitored Patient Re-evaluated:Patient Re-evaluated prior to inductionOxygen Delivery Method: Circle system utilized Preoxygenation: Pre-oxygenation with 100% oxygen Intubation Type: IV induction Ventilation: Mask ventilation without difficulty LMA: LMA inserted LMA Size: 5.0 Number of attempts: 1 Airway Equipment and Method: Bite block Placement Confirmation: positive ETCO2 and breath sounds checked- equal and bilateral Tube secured with: Tape Dental Injury: Teeth and Oropharynx as per pre-operative assessment

## 2016-05-31 NOTE — Telephone Encounter (Signed)
Relation to ZO:XWRUpt:self Call back [phone] Pharmacy: [facility] - [place] - [address] [phone] (Phone) [phone])     Reason for call:  Patient requesting acid reflux medication and states doesn't know Rx name patient would like to speak with nurse directly and states he's unhappy, please advise

## 2016-05-31 NOTE — Op Note (Signed)
PROCEDURE: Thulium laser vaporization of prostate  PREOPERATIVE DIAGNOSIS: BPH with LUTS and urinary retention  POSTOPERATIVE DIAGNOSIS: BPH with LUTS and urinary retention  SURGEON: Dr. Jethro BolusSigmund Tannenbaum  RESIDENT: Dr. Lincoln Brighamroy Sukhu  ANESTHESIA: General.  SPECIMEN: None.  DRAINS: 22-French straight three way Foley catheter.  BLOOD LOSS: 25cc  COMPLICATIONS: None.  INDICATIONS: Evan [name] is a 76 year old white male with BPH who has failed medical therapy. He is to undergo laser vaporization of the prostate. Risks and benefits discussed, written informed consent obtained.  FINDINGS OF PROCEDURE: He was given Ancef. He was taken to the operating room where general anesthetic was induced. He was placed in lithotomy position and fitted with PAS hose.  His perineum and genitalia were prepped with Betadine solution and draped in usual sterile fashion.  Cystoscopy was performed using a 23-French scope and 30-degree lens. Examination revealed a normal urethra. The external sphincter was intact. The prostatic urethra was short with mainly lateral lobe bilobar hyperplasia. There was evidence of prior surgery at the bladder neck The ureteral orifices were difficult to visualize so fluoroscein was given with brisk drainage. There was mild trabeculation with a wide mouth right lateral base diverticulum.  No stones were seen.  After thorough inspection, the continuous-flow laser scope was placed and was fitted with 600 micron Thulium end fire laser fiber. The power was set on 100. The middle lobe was then vaporized from 5 to 7 o'clock and the floor of the prostate was then vaporized. We then switched to a side fire fiber. Additional vaporization of the floor was performed followed by vaporization of the right lateral lobe from bladder neck to apex followed by vaporization of the left lateral lobe from bladder neck to apex. Once an adequate channel was  created the floor of the prostate was further vaporized out to alongside the verumontanum.   At this point, a very substantial channel had been created. Final inspection was performed, and the bladder was evacuated free of the debris. There was excellent hemostasis. Some oozing was noted from the urethra due to irritation from the scope.The bladder was refilled, and the scope was removed. A 22Fr straight three way foley catheter was passed into the bladder. 30ml of sterile water was placed in the balloon.The third port was capped with a catheter plug. The patient was then taken down from lithotomy position. His anesthetic was reversed. He was moved to recovery room in stable condition. There were no complications.  Dr. Patsi Searsannenbaum was present and scrubbed for the entire procedure.  Post-op plan: 1) Patient to have foley removed in 2-3 days with trial of void

## 2016-05-31 NOTE — Anesthesia Preprocedure Evaluation (Addendum)
Anesthesia Evaluation  Patient identified by MRN, date of birth, ID band Patient awake    Reviewed: Allergy & Precautions, NPO status , Patient's Chart, lab work & pertinent test results  History of Anesthesia Complications Negative for: history of anesthetic complications  Airway Mallampati: I  TM Distance: >3 FB Neck ROM: Full    Dental  (+) Edentulous Upper, Edentulous Lower   Pulmonary COPD, former smoker (quit 2015),    breath sounds clear to auscultation       Cardiovascular (-) angina Rhythm:Regular Rate:Normal  '15 ECHO: EF 50-55%, grade 1 diastolic dysfunction, valves OK   Neuro/Psych Anxiety Depression Multiple back surgeries: chronic back pain and LE weakness: uses wheelchair    GI/Hepatic Neg liver ROS, GERD  Medicated and Controlled,  Endo/Other  Morbid obesity  Renal/GU negative Renal ROS     Musculoskeletal  (+) Arthritis ,   Abdominal (+) + obese,   Peds  Hematology negative hematology ROS (+)   Anesthesia Other Findings   Reproductive/Obstetrics                            Anesthesia Physical Anesthesia Plan  ASA: III  Anesthesia Plan: General   Post-op Pain Management:    Induction: Intravenous  Airway Management Planned: LMA  Additional Equipment:   Intra-op Plan:   Post-operative Plan:   Informed Consent: I have reviewed the patients History and Physical, chart, labs and discussed the procedure including the risks, benefits and alternatives for the proposed anesthesia with the patient or authorized representative who has indicated his/her understanding and acceptance.   Dental advisory given  Plan Discussed with: CRNA and Surgeon  Anesthesia Plan Comments: (Plan routine monitors, GA- LMA oK)        Anesthesia Quick Evaluation

## 2016-05-31 NOTE — Anesthesia Postprocedure Evaluation (Signed)
Anesthesia Post Note  Patient: Evan [name]  Procedure(s) Performed: Procedure(s) (LRB): TRANSURETHRAL RESECTION OF THE PROSTATE (TURP) (N/A)  Patient location during evaluation: PACU Anesthesia Type: General Level of consciousness: awake and alert, oriented and patient cooperative Pain management: pain level controlled Vital Signs Assessment: post-procedure vital signs reviewed and stable Respiratory status: spontaneous breathing, nonlabored ventilation and respiratory function stable Cardiovascular status: blood pressure returned to baseline and stable Postop Assessment: no signs of nausea or vomiting Anesthetic complications: no       Last Vitals:  Vitals:   05/31/16 1225 05/31/16 1230  BP: 121/72 108/60  Pulse: 75 74  Resp: 12 13  Temp: 36.5 C     Last Pain:  Vitals:   05/31/16 0915  TempSrc: Oral                 Danah Reinecke,E. Caoilainn Sacks

## 2016-05-31 NOTE — Interval H&P Note (Signed)
History and Physical Interval Note:  05/31/2016 11:05 AM  Evan [name]  has presented today for surgery, with the diagnosis of BENIGN PROSTATIC HYPERPLASIA  The various methods of treatment have been discussed with the patient and family. After consideration of risks, benefits and other options for treatment, the patient has consented to  Procedure(s): TRANSURETHRAL RESECTION OF THE PROSTATE (TURP) (N/A) as a surgical intervention .  The patient's history has been reviewed, patient examined, no change in status, stable for surgery.  I have reviewed the patient's chart and labs.  Questions were answered to the patient's satisfaction.     [name] I [name]  Pt seen and examined. He has recurrent urinary retention, and will have thulium laser TURP. I am not very optomistic, however, that he will recover normal voiding ability, because he has minimal/no bladder spasm.

## 2016-05-31 NOTE — Transfer of Care (Signed)
Immediate Anesthesia Transfer of Care Note  Patient: Evan [name]  Procedure(s) Performed: Procedure(s) (LRB): TRANSURETHRAL RESECTION OF THE PROSTATE (TURP) (N/A)  Patient Location: PACU  Anesthesia Type: General  Level of Consciousness: awake, sedated, patient cooperative and responds to stimulation  Airway & Oxygen Therapy: Patient Spontanous Breathing and Patient connected to face mask oxygen  Post-op Assessment: Report given to PACU RN, Post -op Vital signs reviewed and stable and Patient moving all extremities  Post vital signs: Reviewed and stable  Complications: No apparent anesthesia complications

## 2016-06-02 ENCOUNTER — Encounter (HOSPITAL_BASED_OUTPATIENT_CLINIC_OR_DEPARTMENT_OTHER): Payer: Self-pay | Admitting: Urology

## 2016-06-03 MED ORDER — OMEPRAZOLE 20 MG PO CPDR: 20.0000 mg | DELAYED_RELEASE_CAPSULE | ORAL | 1 refills | Status: DC

## 2016-06-03 NOTE — Addendum Note (Signed)
Addended by: Verdie ShireBAYNES, ANGELA M on: 06/03/2016 11:19 AM   Modules accepted: Orders

## 2016-06-23 DIAGNOSIS — M5137 Other intervertebral disc degeneration, lumbosacral region: Secondary | ICD-10-CM | POA: Diagnosis not present

## 2016-07-14 ENCOUNTER — Telehealth: Payer: Self-pay

## 2016-07-14 ENCOUNTER — Encounter: Payer: Self-pay | Admitting: Family Medicine

## 2016-07-14 ENCOUNTER — Ambulatory Visit (INDEPENDENT_AMBULATORY_CARE_PROVIDER_SITE_OTHER): Payer: Medicare Other | Admitting: Family Medicine

## 2016-07-14 ENCOUNTER — Encounter: Payer: Self-pay | Admitting: Neurology

## 2016-07-14 ENCOUNTER — Ambulatory Visit: Payer: Medicare Other | Admitting: *Deleted

## 2016-07-14 VITALS — BP 124/72 | HR 66 | Temp 97.8°F | Resp 16 | Ht 66.0 in | Wt 220.0 lb

## 2016-07-14 DIAGNOSIS — Z122 Encounter for screening for malignant neoplasm of respiratory organs: Secondary | ICD-10-CM | POA: Diagnosis not present

## 2016-07-14 DIAGNOSIS — R29898 Other symptoms and signs involving the musculoskeletal system: Secondary | ICD-10-CM

## 2016-07-14 MED ORDER — COLCHICINE 0.6 MG PO TABS: ORAL_TABLET | ORAL | 0 refills | Status: AC

## 2016-07-14 MED ORDER — FEBUXOSTAT 40 MG PO TABS: 40.0000 mg | ORAL_TABLET | Freq: Every day | ORAL | 2 refills | Status: DC | PRN

## 2016-07-14 NOTE — Progress Notes (Signed)
Pre visit review using our clinic review tool, if applicable. No additional management support is needed unless otherwise documented below in the visit note. 

## 2016-07-14 NOTE — Telephone Encounter (Signed)
PA initiated via Covermymeds; KEY: [id number]: [id number]. Received real-time response.   Request Reference Number: [id number]. ULORIC TAB 40MG  is approved through 04/18/2017. For further questions, call [phone]

## 2016-07-14 NOTE — Patient Instructions (Signed)
If you do not hear anything about your referral in the next 1-2 weeks, call our office and ask for an update.  

## 2016-07-14 NOTE — Progress Notes (Signed)
Chief Complaint  Patient presents with  . Benign Prostatic Hypertrophy    Pt here for follow up. Sees Dr Patsi Searsannenbaum and had TURP in 05/2016. Pt has handicap parking placard to be completed today.    Subjective: Patient is a 76 y.o. male here for f/u BPH.  The patient had a transurethral resection of prostate from one month ago. He is recovering well from the surgery. He is scheduled to follow-up with urology in 2 months. He is currently taking 0.8 mg of Flomax daily.  The patient is requesting a form be filled out for a handicap sticker. He had a lumbar fusion 2 years ago Dr. Lucila Maineramm. He's had residual right leg weakness and has been unable to ambulate since then. He was [facility] wants to order EMG. The patient ended up canceling the appointment. He is now followed up with his neurologist since then. His wife is requesting to be sent back to her neurologist, preferably [place] due to location. He's not having any numbness, tingling, or swelling.   ROS: Heart: Denies chest pain  Lungs: Denies SOB  Family History  Problem Relation Age of Onset  . Diabetes Mother   . Parkinson's disease Father   . Heart disease Father    Past Medical History:  Diagnosis Date  . Abnormality of gait    multiple cervical spondylosis  . Anxiety   . Bilateral leg weakness    due to back surgeries--  mostly uses wheelchair and at home very short distant w/ walker  . BPH (benign prostatic hyperplasia)   . Cervical spondylosis without myelopathy    per dr cram (neuro surg.)  . Chronic back pain   . Chronic gout   . Depression   . Diverticulosis of colon   . Emphysema/COPD (HCC)   . Full dentures   . GERD (gastroesophageal reflux disease)   . Hard of hearing    does not wear hearing aides  . Hemorrhoids   . Hyperlipidemia   . Lower urinary tract symptoms (LUTS)   . Numbness    hands and related to neck  . OA (osteoarthritis)    hands  .  Self-catheterizes urinary bladder   . Wears glasses    Allergies  Allergen Reactions  . Hydromorphone Itching  . Oxycodone Itching and Nausea Only    Current Outpatient Prescriptions:  .  colchicine 0.6 MG tablet, TAKE 1 TABLET BY MOUTH EVERY 2 HOURS UNTIL RELIEF as needed, Disp: 30 tablet, Rfl: 0 .  diazepam (VALIUM) 5 MG tablet, Take 1 tablet by mouth daily as needed. Neck pain, Disp: , Rfl:  .  febuxostat (ULORIC) 40 MG tablet, Take 1 tablet (40 mg total) by mouth daily as needed., Disp: 30 tablet, Rfl: 2 .  HYDROcodone-acetaminophen (NORCO) 5-325 MG tablet, Take 1 tablet by mouth every 4 (four) hours as needed for moderate pain., Disp: 15 tablet, Rfl: 0 .  Multiple Vitamins-Minerals (CENTRUM SILVER ADULT 50+ PO), Take 1 tablet by mouth daily., Disp: , Rfl:  .  niacin 500 MG tablet, Take 500 mg by mouth every morning. , Disp: , Rfl:  .  Omega-3 Fatty Acids (FISH OIL) 1000 MG CAPS, Take 2,000 mg by mouth every morning. , Disp: , Rfl:  .  omeprazole (PRILOSEC) 20 MG capsule, Take 1 capsule (20 mg total) by mouth every morning., Disp: 90 capsule, Rfl: 1 .  Polyethyl Glycol-Propyl Glycol (SYSTANE OP), Place 2 drops into both eyes at bedtime as needed. ,  Disp: , Rfl:  .  Probiotic Product (PROBIOTIC PO), Take 1 capsule by mouth every morning. , Disp: , Rfl:  .  sertraline (ZOLOFT) 25 MG tablet, TAKE 1 TABLET BY MOUTH DAILY (Patient taking differently: TAKE 1 TABLET BY MOUTH DAILY--- takes in am), Disp: 90 tablet, Rfl: 0 .  tamsulosin (FLOMAX) 0.4 MG CAPS capsule, Take 2 capsules (0.8 mg total) by mouth daily. (Patient taking differently: Take 0.8 mg by mouth every morning. ), Disp: 180 capsule, Rfl: 0 .  trimethoprim (TRIMPEX) 100 MG tablet, Take 1 tablet by mouth daily., Disp: , Rfl:   Objective: BP 124/72 (BP Location: Right Arm, Cuff Size: Normal)   Pulse 66   Temp 97.8 F (36.6 C) (Oral)   Resp 16   Ht 5\' 6"  (1.676 m)   Wt 220 lb (99.8 kg) Comment: Pt reported; could not stand  SpO2  97% Comment: room air  BMI 35.51 kg/m  General: Awake, appears stated age, sitting in wheel chair Lungs: No accessory muscle use MSK: 5/5 strength with L hip flexion, and b/l knee flexion/extension, 4/5 strength with R hip flexion Neuro: sensation intact to light touch, no cerebellar signs Psych: Age appropriate judgment and insight, normal affect and mood  Assessment and Plan: Weakness of right lower extremity - Plan: Ambulatory referral to Neurology  Encounter for screening for lung cancer - Plan: CT CHEST LUNG CANCER SCREENING LOW DOSE WO CONTRAST  Orders as above. I would like him to have the EMG done. Will refer to [facility] for further evaluation. Stated I will fill out the handicap form to best of my ability as his back surgeon is no longer seeing him. F/u prn. The patient voiced understanding and agreement to the plan.  Jilda Roche [place], DO 07/14/16  5:14 PM

## 2016-07-15 ENCOUNTER — Telehealth: Payer: Self-pay | Admitting: *Deleted

## 2016-07-15 DIAGNOSIS — R339 Retention of urine, unspecified: Secondary | ICD-10-CM | POA: Diagnosis not present

## 2016-07-15 NOTE — Telephone Encounter (Signed)
Called and spoke with Deniece PortelaWayne with [facility] and informed him of the approval letter for the Uloric 40mg .  Uloric tab 40mg , use as directed, is approved through 04/18/17 under Medicare Part D benefit.  He verbalized understanding and will call the pt to let him know the prescription is ready.//AB/CMA

## 2016-07-19 ENCOUNTER — Ambulatory Visit (HOSPITAL_BASED_OUTPATIENT_CLINIC_OR_DEPARTMENT_OTHER)
Admission: RE | Admit: 2016-07-19 | Discharge: 2016-07-19 | Disposition: A | Discharge status: Home / Self Care | Payer: Medicare Other | Source: Ambulatory Visit | Attending: Family Medicine | Admitting: Family Medicine

## 2016-07-19 DIAGNOSIS — J439 Emphysema, unspecified: Secondary | ICD-10-CM | POA: Insufficient documentation

## 2016-07-19 DIAGNOSIS — R918 Other nonspecific abnormal finding of lung field: Secondary | ICD-10-CM | POA: Diagnosis not present

## 2016-07-19 DIAGNOSIS — Z122 Encounter for screening for malignant neoplasm of respiratory organs: Secondary | ICD-10-CM | POA: Diagnosis not present

## 2016-07-19 DIAGNOSIS — Z87891 Personal history of nicotine dependence: Secondary | ICD-10-CM | POA: Diagnosis not present

## 2016-07-19 DIAGNOSIS — I2510 Atherosclerotic heart disease of native coronary artery without angina pectoris: Secondary | ICD-10-CM | POA: Diagnosis not present

## 2016-07-23 ENCOUNTER — Telehealth: Payer: Self-pay | Admitting: *Deleted

## 2016-07-23 DIAGNOSIS — R918 Other nonspecific abnormal finding of lung field: Secondary | ICD-10-CM

## 2016-07-23 NOTE — Telephone Encounter (Signed)
-----   Message from Sharlene Dory, DO sent at 07/20/2016  2:43 PM EDT ----- Noted.  AB- please place order with above dx noted in impression. TY.

## 2016-07-24 DIAGNOSIS — M5137 Other intervertebral disc degeneration, lumbosacral region: Secondary | ICD-10-CM | POA: Diagnosis not present

## 2016-07-26 NOTE — Telephone Encounter (Signed)
Placed order for the CT Chest LCS and sent.//AB/CMA

## 2016-07-29 DIAGNOSIS — M47812 Spondylosis without myelopathy or radiculopathy, cervical region: Secondary | ICD-10-CM | POA: Diagnosis not present

## 2016-07-30 ENCOUNTER — Other Ambulatory Visit: Payer: Self-pay | Admitting: *Deleted

## 2016-07-30 MED ORDER — SERTRALINE HCL 25 MG PO TABS: ORAL_TABLET | ORAL | 1 refills | Status: DC

## 2016-07-30 MED ORDER — OMEPRAZOLE 20 MG PO CPDR: 20.0000 mg | DELAYED_RELEASE_CAPSULE | ORAL | 1 refills | Status: DC

## 2016-07-30 NOTE — Telephone Encounter (Signed)
Rx's sent to the pharmacy by e-script.//AB/CMA 

## 2016-08-04 ENCOUNTER — Other Ambulatory Visit: Payer: Self-pay | Admitting: *Deleted

## 2016-08-05 MED ORDER — TAMSULOSIN HCL 0.4 MG PO CAPS: 0.8000 mg | ORAL_CAPSULE | Freq: Every morning | ORAL | 1 refills | Status: DC

## 2016-08-05 NOTE — Telephone Encounter (Signed)
Rx sent to the pharmacy by e-script.//AB/CMA 

## 2016-08-06 ENCOUNTER — Telehealth: Payer: Self-pay | Admitting: Family Medicine

## 2016-08-06 NOTE — Telephone Encounter (Signed)
°  Relation to ZO:XWRU Call back number:[phone] Pharmacy: [facility] - [place], [place] - [address] [phone] (Fax)     Reason for call:  Patient states pharmacy never received tamsulosin (FLOMAX) 0.4 MG CAPS capsule, please advise

## 2016-08-06 NOTE — Telephone Encounter (Signed)
Called and spoke with the pt's wife and informed her that the pt's prescription had been sent and is waiting to be picked up at the pharmacy.  She stated that they received a call from the pharmacy stating the prescription was ready.//AB/CMA

## 2016-08-23 DIAGNOSIS — M5137 Other intervertebral disc degeneration, lumbosacral region: Secondary | ICD-10-CM | POA: Diagnosis not present

## 2016-09-03 ENCOUNTER — Ambulatory Visit (INDEPENDENT_AMBULATORY_CARE_PROVIDER_SITE_OTHER): Payer: Medicare Other | Admitting: Neurology

## 2016-09-03 ENCOUNTER — Encounter: Payer: Self-pay | Admitting: Neurology

## 2016-09-03 VITALS — BP 130/74 | HR 64 | Ht 65.0 in | Wt 220.0 lb

## 2016-09-03 DIAGNOSIS — Z9889 Other specified postprocedural states: Secondary | ICD-10-CM | POA: Diagnosis not present

## 2016-09-03 DIAGNOSIS — G20 Parkinson's disease: Secondary | ICD-10-CM | POA: Insufficient documentation

## 2016-09-03 DIAGNOSIS — M4716 Other spondylosis with myelopathy, lumbar region: Secondary | ICD-10-CM

## 2016-09-03 DIAGNOSIS — R29898 Other symptoms and signs involving the musculoskeletal system: Secondary | ICD-10-CM

## 2016-09-03 DIAGNOSIS — R339 Retention of urine, unspecified: Secondary | ICD-10-CM | POA: Diagnosis not present

## 2016-09-03 MED ORDER — CARBIDOPA-LEVODOPA 25-100 MG PO TABS: 1.0000 | ORAL_TABLET | Freq: Three times a day (TID) | ORAL | 5 refills | Status: DC

## 2016-09-03 NOTE — Progress Notes (Signed)
[facility] Clinic Note - Initial Visit   Date: 09/03/16  Evan HillockRichard A [name] MRN: [id number] DOB: [birth year]   Dear Dr. Carmelia RollerWendling:  Thank you for your kind referral of Evan [name] for consultation of right leg weakness. Although his history is well known to you, please allow Evan [name] to reiterate it for the purpose of our medical record. The patient was accompanied to the clinic by wife who also provides collateral information.     History of Present Illness: Evan [name] is a 76 y.o. right-handed Caucasian male with BPH s/p TURP, gout, chronic neck and low back pain s/p multiple surgeries to his cervical and lumbar region, GERD and depression  presenting for evaluation of right leg weakness.    The patient has 5 separate surgeries on the cervical spine and 2 lumbar surgeries.  He has known myelomalacia at the C3-4 and C5-6 levels.  In April 2016, he underwent decompressive laminotomy and fusion at L4-5 by Dr. Wynetta Emeryram.  Following his last surgery, he was able to ambulate with a walker and discharged to rehab.  He was discharged home with a walker and within about 3 months, he gradually started getting weaker in his right leg with inability of hip flexion and began using a wheelchair since then.  He followed up with Dr. Wynetta Emeryram who mentioned that he most likely has nerve injury and referred to [facility] in July 2017 whose note is reviewed. He did not return for EMG.  There is note that he had MRI brain (I do not have this report) which showed pontine ischemia and ventriculomegaly.  He reports being testing for NPH which returned normal.  He was able to ambulate prior to his surgery and ambulate with a walker for several months following his surgery.  However, for at least the past 1.5 year, he has been entirely wheelchair dependent because of right leg weakness.  He does not have weakness of the left leg  He denies any numbness/tingling  of the legs or bowel/bladder incontinence.  He is able to stand and transfer.  At home, he uses a lift chair.  He is able to bath and go to the bathroom, but requires assistance for dressing.    He also has history of left ulnar neuropathy s/p transposition > 20 years ago and has residual prominent left hand atrophy and weakness.  He sees Dr. Ollen BowlHarkins for neck pain and receives ESI.    Out-side paper records, electronic medical record, and images have been reviewed where available and summarized as:  MRI lumbar spine wwo contrast 12/23/2011: Postsurgical changes at L3-4 and L4-5 with excision of the prior disc extrusion and protrusion.    Persistent moderately severe spinal stenosis at L4-5.   Past Medical History:  Diagnosis Date  . Abnormality of gait    multiple cervical spondylosis  . Anxiety   . Bilateral leg weakness    due to back surgeries--  mostly uses wheelchair and at home very short distant w/ walker  . BPH (benign prostatic hyperplasia)   . Cervical spondylosis without myelopathy    per dr cram (neuro surg.)  . Chronic back pain   . Chronic gout   . Depression   . Diverticulosis of colon   . Emphysema/COPD (HCC)   . Full dentures   . GERD (gastroesophageal reflux disease)   . Hard of hearing    does not wear hearing aides  . Hemorrhoids   . Hyperlipidemia   .  Lower urinary tract symptoms (LUTS)   . Numbness    hands and related to neck  . OA (osteoarthritis)    hands  . Self-catheterizes urinary bladder   . Wears glasses     Past Surgical History:  Procedure Laterality Date  . ABDOMINAL HERNIA REPAIR  1970's  . ANTERIOR CERVICAL DECOMP/DISCECTOMY FUSION  07/03/2001   C4 -- C5/  post op Exploration and evacuation cervical hematoma  . BLEPHAROPLASTY  2008  . CARDIOVASCULAR STRESS TEST  05/27/1998   normal nuclear study w/ no ischemia/  normal LV function and wall motion , ef 64%  . CARPAL TUNNEL RELEASE Left 2003 approx.  . CERVICAL FUSION  1995   C5 -- C6   . COLONOSCOPY WITH PROPOFOL N/A 02/10/2016   Procedure: COLONOSCOPY WITH PROPOFOL;  Surgeon: Hilarie Fredrickson, MD;  Location: [facility];  Service: Endoscopy;  Laterality: N/A;  . CYSTO/  LEFT RETROGRADE PYELOGRAM/  BALLOON DILATION LEFT URETER/  URETEROSCOPY/  STENT PLACEMENT  07/28/2000  . ESOPHAGOGASTRODUODENOSCOPY    . EXPLORATION AND RE-DO CERVICAL FUSION C4--5 AND ANTERIOR CERVIAL DISKECTOMY FUSION C3--4  08/09/2007   POST-OP EXPLORATION AND EVACUATION RETROPHARYNGEAL HEMATOMA  . LAPAROSCOPIC CHOLECYSTECTOMY  1990'S  . LUMBAR LAMINECTOMY/DECOMPRESSION MICRODISCECTOMY  10/29/2011   Procedure: LUMBAR LAMINECTOMY/DECOMPRESSION MICRODISCECTOMY 1 LEVEL;  Surgeon: Mariam Dollar, MD;  Location: [facility];  Service: Neurosurgery;  Laterality: Right;  Right Lumbar Three-Four Lumbar Laminectomy/Microdiscectomy  . MAXIMUM ACCESS (MAS)POSTERIOR LUMBAR INTERBODY FUSION (PLIF) 1 LEVEL N/A 07/22/2014   Procedure: FOR MAXIMUM ACCESS (MAS) POSTERIOR LUMBAR INTERBODY FUSION (PLIF) 1 LEVEL;  Surgeon: Donalee Citrin, MD;  Location: [facility];  Service: Neurosurgery;  Laterality: N/A;  FOR MAXIMUM ACCESS (MAS) POSTERIOR LUMBAR INTERBODY FUSION (PLIF) 1 LEVEL L4-5  . NEUROPLASTY / TRANSPOSITION ULNAR NERVE AT ELBOW Left 07/23/2008  . POSTERIOR CERVICAL FUSION/FORAMINOTOMY  06/09/2011   Procedure: POSTERIOR CERVICAL FUSION/FORAMINOTOMY LEVEL 4;  Surgeon: Mariam Dollar, MD;  Location: [facility];  Service: Neurosurgery;  Laterality: N/A;  Cervical three to seven  posterior cervical fusion, Cervical four to seven Laminectomy, bone graft from right iliac crest   . POSTERIOR LUMBAR LAMINECTOMY AND DISKECTOMY  10/15/2009   L3 -- L4  . TONSILLECTOMY AND ADENOIDECTOMY  child  . TRANSTHORACIC ECHOCARDIOGRAM  11/27/2013   grade 1 diastolic dysfunction, ef 50-55%/  trivial AR and PR/ mild MR and TR/  PASP  . TRANSURETHRAL RESECTION OF PROSTATE N/A 05/31/2016   Procedure: TRANSURETHRAL RESECTION OF THE PROSTATE (TURP);   Surgeon: Jethro Bolus, MD;  Location: [facility];  Service: Urology;  Laterality: N/A;     Medications:  Outpatient Encounter Prescriptions as of 09/03/2016  Medication Sig  . colchicine 0.6 MG tablet TAKE 1 TABLET BY MOUTH EVERY 2 HOURS UNTIL RELIEF as needed  . diazepam (VALIUM) 5 MG tablet Take 1 tablet by mouth daily as needed. Neck pain  . febuxostat (ULORIC) 40 MG tablet Take 1 tablet (40 mg total) by mouth daily as needed.  Marland Kitchen HYDROcodone-acetaminophen (NORCO) 5-325 MG tablet Take 1 tablet by mouth every 4 (four) hours as needed for moderate pain.  . Multiple Vitamins-Minerals (CENTRUM SILVER ADULT 50+ PO) Take 1 tablet by mouth daily.  . niacin 500 MG tablet Take 500 mg by mouth every morning.   . Omega-3 Fatty Acids (FISH OIL) 1000 MG CAPS Take 2,000 mg by mouth every morning.   Marland Kitchen omeprazole (PRILOSEC) 20 MG capsule Take 1 capsule (20 mg total) by mouth every morning.  Marland Kitchen  Polyethyl Glycol-Propyl Glycol (SYSTANE OP) Place 2 drops into both eyes at bedtime as needed.   . Probiotic Product (PROBIOTIC PO) Take 1 capsule by mouth every morning.   . sertraline (ZOLOFT) 25 MG tablet TAKE 1 TABLET BY MOUTH DAILY.  . tamsulosin (FLOMAX) 0.4 MG CAPS capsule Take 2 capsules (0.8 mg total) by mouth every morning.  . trimethoprim (TRIMPEX) 100 MG tablet Take 1 tablet by mouth daily.  . carbidopa-levodopa (SINEMET IR) 25-100 MG tablet Take 1 tablet by mouth 3 (three) times daily.   No facility-administered encounter medications on file as of 09/03/2016.      Allergies:  Allergies  Allergen Reactions  . Hydromorphone Itching  . Oxycodone Itching and Nausea Only    Family History: Family History  Problem Relation Age of Onset  . Diabetes Mother   . Parkinson's disease Father   . Heart disease Father     Social History: Social History  Substance Use Topics  . Smoking status: Former Smoker    Packs/day: 1.50    Years: 55.00    Types: Cigarettes    Quit date:  05/27/2013  . Smokeless tobacco: Current User    Types: Chew     Comment: occasional chew tobacco  . Alcohol use No   Social History   Social History Narrative   Lives at home w/ his wife   Right-handed   Drinks 1-2 sodas per day    Review of Systems:  CONSTITUTIONAL: No fevers, chills, night sweats, or weight loss.   EYES: No visual changes or eye pain ENT: No hearing changes.  No history of nose bleeds.   RESPIRATORY: No cough, wheezing and shortness of breath.   CARDIOVASCULAR: Negative for chest pain, and palpitations.   GI: Negative for abdominal discomfort, blood in stools or black stools.  No recent change in bowel habits.   GU:  No history of incontinence.   MUSCLOSKELETAL: +history of joint pain or swelling.  No myalgias.   SKIN: Negative for lesions, rash, and itching.   HEMATOLOGY/ONCOLOGY: Negative for prolonged bleeding, bruising easily, and swollen nodes.   ENDOCRINE: Negative for cold or heat intolerance, polydipsia or goiter.   PSYCH:  No depression or anxiety symptoms.   NEURO: As Above.   Vital Signs:  BP 130/74   Pulse 64   Ht 5\' 5"  (1.651 m)   Wt 220 lb (99.8 kg)   SpO2 96%   BMI 36.61 kg/m    General Medical Exam:   General:  Well appearing, severely blunted affect, masked facies, comfortable.   Eyes/ENT: see cranial nerve examination.   Neck: No masses appreciated.  Full range of motion without tenderness.  No carotid bruits. Respiratory:  Clear to auscultation, good air entry bilaterally.   Cardiac:  Regular rate and rhythm, no murmur.   Extremities:  No deformities, 2+ pitting edema.  Skin:  No rashes or lesions.  Neurological Exam: MENTAL STATUS including orientation to time, place, person, recent and remote memory, attention span and concentration, language, and fund of knowledge is normal.  Speech is not dysarthric.  CRANIAL NERVES: II:  No visual field defects.  Unremarkable fundi.   III-IV-VI: Pupils equal round and reactive to light.   Normal conjugate, extra-ocular eye movements in all directions of gaze.  No nystagmus.  Mild bilateral ptosis (old).   V:  Normal facial sensation.    VII:  Normal facial symmetry and movements.   VIII:  Normal hearing and vestibular function.   IX-X:  Normal  palatal movement.   XI:  Normal shoulder shrug and head rotation.   XII:  Normal tongue strength and range of motion, no deviation or fasciculation.  MOTOR:  Marked atrophy of the left FDI, ADM, and interosseus muscles.  There is a resting right hand tremor. No pronator drift.  Tone is normal.    Right Upper Extremity:    Left Upper Extremity:    Deltoid  5/5   Deltoid  5/5   Biceps  5/5   Biceps  5/5   Triceps  5/5   Triceps  5/5   Wrist extensors  5/5   Wrist extensors  5/5   Wrist flexors  5/5   Wrist flexors  5/5   Finger extensors  5/5   Finger extensors  5/5   Finger flexors  5/5   Finger flexors  5/5   Dorsal interossei  5/5   Dorsal interossei  4/5   Abductor pollicis  5/5   Abductor pollicis  5-/5   Tone (Ashworth scale)  0  Tone (Ashworth scale)  0   Right Lower Extremity:    Left Lower Extremity:    Hip flexors  3/5   Hip flexors  5/5   Hip extensors  5/5   Hip extensors  5/5   Adductor 4/5  Adductor 5/5  Abductor 5/5  Abductor 5/5  Knee flexors  5/5   Knee flexors  5/5   Knee extensors  5/5   Knee extensors  5/5   Dorsiflexors  5/5   Dorsiflexors  5/5   Plantarflexors  5/5   Plantarflexors  5/5   Toe extensors  5/5   Toe extensors  5/5   Toe flexors  5/5   Toe flexors  5/5   Tone (Ashworth scale)  0  Tone (Ashworth scale)  0   MSRs:  Right                                                                 Left brachioradialis 3+  brachioradialis 3+  biceps 3+  biceps 3+  triceps 3+  triceps 3+  patellar 2+  patellar 3+  ankle jerk 2+  ankle jerk 2+  Hoffman no  Hoffman no  plantar response down  plantar response down   SENSORY:  Normal and symmetric perception of light touch, pinprick, vibration, and  proprioception.    COORDINATION/GAIT: Normal finger-to- nose-finger.  There is reduced amplitude and rate of finger tapping on the right hand. Gait was not tested as patient in wheelchair.   IMPRESSION: 1.  Right leg weakness since 2016 following L4-5 decompression is most likely due to L3-4 nerve root impingement.  Given that his weakness has been present for almost 2 years, it was explained that nerve recovery even from another surgery, IF he was a candidate, would probably not improve his motor deficits.  I recommend that he start home physical therapy.  If no improvement with PT, MRI lumbar spine can be ordered.  I do not see that electrodiagnostic testing would be helpful in this case has he has clear weakness of hip flexion and hip adduction which correlates with L3-4 myotome involvement.  Nerve testing would only confirm the presence of neurogenic injury and severity.  If his symptoms change or he  develops weakness outside of the L3-4 nerve distribution, electrodiagnostic testing can be ordered at that time.   2.  Parkinsonism manifesting with severely masked facies, right hand resting tremor and bradykinesia.  I will offer him a trial of sinemet 25/100 half-tab TID and titrate to 1 tab TID.    3.  Cervical myelopathy at C3-4 and C5-6 level with myelomalacia   4. Lumbosacral spinal stenosis at L3-4 and L4-5, status post surgery x 2 by Dr. Wynetta Emery.    5.  Ventriculomegaly, negative work-up elsewhere for normal pressure hydrocephalus-   Return to clinic in 2 months.   The duration of this appointment visit was 60 minutes of face-to-face time with the patient.  Greater than 50% of this time was spent in counseling, explanation of diagnosis, planning of further management, and coordination of care.   Thank you for allowing me to participate in patient's care.  If I can answer any additional questions, I would be pleased to do so.    Sincerely,    Daesean Lazarz K. Allena Katz, DO

## 2016-09-03 NOTE — Patient Instructions (Signed)
Start Carbidopa Levodopa as follows at least 30-min prior to meals:     AM  Afternoon   Evening   Week 1:  1/2 tab  1/2 tab   1/2 tab  Week 2:   1/2 tab  1/2 tab   1 tab  Week 3:  1/2 tab  1 tab   1 tab  Week 4:  1 tab  1 tab   1 tab  *Avoid taking with protein products, such as milk, meat, cheese  *if you develop nausea, take with crackers  Start home physical therapy for right leg weakness  Return to clinic in 2 months

## 2016-09-10 DIAGNOSIS — I10 Essential (primary) hypertension: Secondary | ICD-10-CM | POA: Diagnosis not present

## 2016-09-14 DIAGNOSIS — N323 Diverticulum of bladder: Secondary | ICD-10-CM | POA: Diagnosis not present

## 2016-09-14 DIAGNOSIS — N3000 Acute cystitis without hematuria: Secondary | ICD-10-CM | POA: Diagnosis not present

## 2016-09-15 DIAGNOSIS — I10 Essential (primary) hypertension: Secondary | ICD-10-CM | POA: Diagnosis not present

## 2016-09-17 DIAGNOSIS — J439 Emphysema, unspecified: Secondary | ICD-10-CM | POA: Diagnosis not present

## 2016-09-17 DIAGNOSIS — I10 Essential (primary) hypertension: Secondary | ICD-10-CM | POA: Diagnosis not present

## 2016-09-17 DIAGNOSIS — Z993 Dependence on wheelchair: Secondary | ICD-10-CM | POA: Diagnosis not present

## 2016-09-17 DIAGNOSIS — Z9181 History of falling: Secondary | ICD-10-CM | POA: Diagnosis not present

## 2016-09-17 DIAGNOSIS — M50022 Cervical disc disorder at C5-C6 level with myelopathy: Secondary | ICD-10-CM | POA: Diagnosis not present

## 2016-09-17 DIAGNOSIS — Z87891 Personal history of nicotine dependence: Secondary | ICD-10-CM | POA: Diagnosis not present

## 2016-09-17 DIAGNOSIS — M48062 Spinal stenosis, lumbar region with neurogenic claudication: Secondary | ICD-10-CM | POA: Diagnosis not present

## 2016-09-17 DIAGNOSIS — G20 Parkinson's disease: Secondary | ICD-10-CM | POA: Diagnosis not present

## 2016-09-21 DIAGNOSIS — Z993 Dependence on wheelchair: Secondary | ICD-10-CM | POA: Diagnosis not present

## 2016-09-21 DIAGNOSIS — J439 Emphysema, unspecified: Secondary | ICD-10-CM | POA: Diagnosis not present

## 2016-09-21 DIAGNOSIS — M50022 Cervical disc disorder at C5-C6 level with myelopathy: Secondary | ICD-10-CM | POA: Diagnosis not present

## 2016-09-21 DIAGNOSIS — Z9181 History of falling: Secondary | ICD-10-CM | POA: Diagnosis not present

## 2016-09-21 DIAGNOSIS — Z87891 Personal history of nicotine dependence: Secondary | ICD-10-CM | POA: Diagnosis not present

## 2016-09-21 DIAGNOSIS — M48062 Spinal stenosis, lumbar region with neurogenic claudication: Secondary | ICD-10-CM | POA: Diagnosis not present

## 2016-09-21 DIAGNOSIS — G20 Parkinson's disease: Secondary | ICD-10-CM | POA: Diagnosis not present

## 2016-09-21 DIAGNOSIS — I10 Essential (primary) hypertension: Secondary | ICD-10-CM | POA: Diagnosis not present

## 2016-09-23 DIAGNOSIS — M50022 Cervical disc disorder at C5-C6 level with myelopathy: Secondary | ICD-10-CM | POA: Diagnosis not present

## 2016-09-23 DIAGNOSIS — I10 Essential (primary) hypertension: Secondary | ICD-10-CM | POA: Diagnosis not present

## 2016-09-23 DIAGNOSIS — G20 Parkinson's disease: Secondary | ICD-10-CM | POA: Diagnosis not present

## 2016-09-23 DIAGNOSIS — Z993 Dependence on wheelchair: Secondary | ICD-10-CM | POA: Diagnosis not present

## 2016-09-23 DIAGNOSIS — M48062 Spinal stenosis, lumbar region with neurogenic claudication: Secondary | ICD-10-CM | POA: Diagnosis not present

## 2016-09-23 DIAGNOSIS — Z87891 Personal history of nicotine dependence: Secondary | ICD-10-CM | POA: Diagnosis not present

## 2016-09-23 DIAGNOSIS — Z9181 History of falling: Secondary | ICD-10-CM | POA: Diagnosis not present

## 2016-09-23 DIAGNOSIS — J439 Emphysema, unspecified: Secondary | ICD-10-CM | POA: Diagnosis not present

## 2016-09-28 DIAGNOSIS — G20 Parkinson's disease: Secondary | ICD-10-CM | POA: Diagnosis not present

## 2016-09-28 DIAGNOSIS — Z87891 Personal history of nicotine dependence: Secondary | ICD-10-CM | POA: Diagnosis not present

## 2016-09-28 DIAGNOSIS — Z993 Dependence on wheelchair: Secondary | ICD-10-CM | POA: Diagnosis not present

## 2016-09-28 DIAGNOSIS — Z9181 History of falling: Secondary | ICD-10-CM | POA: Diagnosis not present

## 2016-09-28 DIAGNOSIS — J439 Emphysema, unspecified: Secondary | ICD-10-CM | POA: Diagnosis not present

## 2016-09-28 DIAGNOSIS — M48062 Spinal stenosis, lumbar region with neurogenic claudication: Secondary | ICD-10-CM | POA: Diagnosis not present

## 2016-09-28 DIAGNOSIS — I10 Essential (primary) hypertension: Secondary | ICD-10-CM | POA: Diagnosis not present

## 2016-09-28 DIAGNOSIS — M50022 Cervical disc disorder at C5-C6 level with myelopathy: Secondary | ICD-10-CM | POA: Diagnosis not present

## 2016-09-29 ENCOUNTER — Ambulatory Visit (HOSPITAL_BASED_OUTPATIENT_CLINIC_OR_DEPARTMENT_OTHER)
Admission: RE | Admit: 2016-09-29 | Discharge: 2016-09-29 | Disposition: A | Discharge status: Home / Self Care | Payer: Medicare Other | Source: Ambulatory Visit | Attending: Family Medicine | Admitting: Family Medicine

## 2016-09-29 ENCOUNTER — Encounter: Payer: Self-pay | Admitting: Family Medicine

## 2016-09-29 ENCOUNTER — Ambulatory Visit (INDEPENDENT_AMBULATORY_CARE_PROVIDER_SITE_OTHER): Payer: Medicare Other | Admitting: Family Medicine

## 2016-09-29 VITALS — BP 143/73 | HR 74 | Temp 97.7°F | Resp 14 | Wt 220.0 lb

## 2016-09-29 DIAGNOSIS — M25512 Pain in left shoulder: Secondary | ICD-10-CM

## 2016-09-29 DIAGNOSIS — G8929 Other chronic pain: Secondary | ICD-10-CM

## 2016-09-29 MED ORDER — METHYLPREDNISOLONE ACETATE 40 MG/ML IJ SUSP
40.0000 mg | Freq: Once | INTRAMUSCULAR | Status: AC
  Administered 2016-09-29: 40 mg

## 2016-09-29 MED ORDER — LIDOCAINE HCL 1 % IJ SOLN
2.0000 mL | Freq: Once | INTRAMUSCULAR | Status: AC
  Administered 2016-09-29: 2 mL

## 2016-09-29 NOTE — Progress Notes (Signed)
Musculoskeletal Exam  Patient: Evan [name] DOB: 1941-03-12  DOS: 09/29/2016  SUBJECTIVE:  Chief Complaint:   Chief Complaint  Patient presents with  . Shoulder Pain    Pt reoprts pain in both shouders but more in left side.     Evan [name] is a 76 y.o.  male for evaluation and treatment of L shoulder pain.   Onset:  3 weeks ago. Recently worsened- had been bothering him for years.    Location: [place] 8-9/10 Character: Has a hard time describing it Progression of issue:  has significantly worsened Associated symptoms: Decreased ROM Treatment: to date has been PT.   Neurovascular symptoms: no  ROS: Musculoskeletal/Extremities: +L shoulder pain Neurologic: no numbness, tingling no weakness   Past Medical History:  Diagnosis Date  . Abnormality of gait    multiple cervical spondylosis  . Anxiety   . Bilateral leg weakness    due to back surgeries--  mostly uses wheelchair and at home very short distant w/ walker  . BPH (benign prostatic hyperplasia)   . Cervical spondylosis without myelopathy    per dr cram (neuro surg.)  . Chronic back pain   . Chronic gout   . Depression   . Diverticulosis of colon   . Emphysema/COPD (HCC)   . Full dentures   . GERD (gastroesophageal reflux disease)   . Hard of hearing    does not wear hearing aides  . Hemorrhoids   . Hyperlipidemia   . Lower urinary tract symptoms (LUTS)   . Numbness    hands and related to neck  . OA (osteoarthritis)    hands  . Self-catheterizes urinary bladder   . Wears glasses    Past Surgical History:  Procedure Laterality Date  . ABDOMINAL HERNIA REPAIR  1970's  . ANTERIOR CERVICAL DECOMP/DISCECTOMY FUSION  07/03/2001   C4 -- C5/  post op Exploration and evacuation cervical hematoma  . BLEPHAROPLASTY  2008  . CARDIOVASCULAR STRESS TEST  05/27/1998   normal nuclear study w/ no ischemia/  normal LV function and wall motion , ef 64%  . CARPAL TUNNEL RELEASE Left 2003  approx.  . CERVICAL FUSION  1995   C5 -- C6  . COLONOSCOPY WITH PROPOFOL N/A 02/10/2016   Procedure: COLONOSCOPY WITH PROPOFOL;  Surgeon: Hilarie Fredrickson, MD;  Location: [facility];  Service: Endoscopy;  Laterality: N/A;  . CYSTO/  LEFT RETROGRADE PYELOGRAM/  BALLOON DILATION LEFT URETER/  URETEROSCOPY/  STENT PLACEMENT  07/28/2000  . ESOPHAGOGASTRODUODENOSCOPY    . EXPLORATION AND RE-DO CERVICAL FUSION C4--5 AND ANTERIOR CERVIAL DISKECTOMY FUSION C3--4  08/09/2007   POST-OP EXPLORATION AND EVACUATION RETROPHARYNGEAL HEMATOMA  . LAPAROSCOPIC CHOLECYSTECTOMY  1990'S  . LUMBAR LAMINECTOMY/DECOMPRESSION MICRODISCECTOMY  10/29/2011   Procedure: LUMBAR LAMINECTOMY/DECOMPRESSION MICRODISCECTOMY 1 LEVEL;  Surgeon: Mariam Dollar, MD;  Location: [facility];  Service: Neurosurgery;  Laterality: Right;  Right Lumbar Three-Four Lumbar Laminectomy/Microdiscectomy  . MAXIMUM ACCESS (MAS)POSTERIOR LUMBAR INTERBODY FUSION (PLIF) 1 LEVEL N/A 07/22/2014   Procedure: FOR MAXIMUM ACCESS (MAS) POSTERIOR LUMBAR INTERBODY FUSION (PLIF) 1 LEVEL;  Surgeon: Donalee Citrin, MD;  Location: [facility];  Service: Neurosurgery;  Laterality: N/A;  FOR MAXIMUM ACCESS (MAS) POSTERIOR LUMBAR INTERBODY FUSION (PLIF) 1 LEVEL L4-5  . NEUROPLASTY / TRANSPOSITION ULNAR NERVE AT ELBOW Left 07/23/2008  . POSTERIOR CERVICAL FUSION/FORAMINOTOMY  06/09/2011   Procedure: POSTERIOR CERVICAL FUSION/FORAMINOTOMY LEVEL 4;  Surgeon: Mariam Dollar, MD;  Location: [facility];  Service: Neurosurgery;  Laterality: N/A;  Cervical three to seven  posterior cervical fusion, Cervical four to seven Laminectomy, bone graft from right iliac crest   . POSTERIOR LUMBAR LAMINECTOMY AND DISKECTOMY  10/15/2009   L3 -- L4  . TONSILLECTOMY AND ADENOIDECTOMY  child  . TRANSTHORACIC ECHOCARDIOGRAM  11/27/2013   grade 1 diastolic dysfunction, ef 50-55%/  trivial AR and PR/ mild MR and TR/  PASP 31mmHg  . TRANSURETHRAL RESECTION OF PROSTATE N/A 05/31/2016   Procedure:  TRANSURETHRAL RESECTION OF THE PROSTATE (TURP);  Surgeon: Jethro BolusSigmund Tannenbaum, MD;  [facility];  Service: Urology;  Laterality: N/A;   Family History  Problem Relation Age of Onset  . Diabetes Mother   . Parkinson's disease Father   . Heart disease Father    Current Outpatient Prescriptions  Medication Sig Dispense Refill  . carbidopa-levodopa (SINEMET IR) 25-100 MG tablet Take 1 tablet by mouth 3 (three) times daily. 90 tablet 5  . colchicine 0.6 MG tablet TAKE 1 TABLET BY MOUTH EVERY 2 HOURS UNTIL RELIEF as needed 30 tablet 0  . diazepam (VALIUM) 5 MG tablet Take 1 tablet by mouth daily as needed. Neck pain    . febuxostat (ULORIC) 40 MG tablet Take 1 tablet (40 mg total) by mouth daily as needed. 30 tablet 2  . HYDROcodone-acetaminophen (NORCO) 5-325 MG tablet Take 1 tablet by mouth every 4 (four) hours as needed for moderate pain. 15 tablet 0  . Multiple Vitamins-Minerals (CENTRUM SILVER ADULT 50+ PO) Take 1 tablet by mouth daily.    . niacin 500 MG tablet Take 500 mg by mouth every morning.     . Omega-3 Fatty Acids (FISH OIL) 1000 MG CAPS Take 2,000 mg by mouth every morning.     Marland Kitchen. omeprazole (PRILOSEC) 20 MG capsule Take 1 capsule (20 mg total) by mouth every morning. 90 capsule 1  . Polyethyl Glycol-Propyl Glycol (SYSTANE OP) Place 2 drops into both eyes at bedtime as needed.     . Probiotic Product (PROBIOTIC PO) Take 1 capsule by mouth every morning.     . sertraline (ZOLOFT) 25 MG tablet TAKE 1 TABLET BY MOUTH DAILY. 90 tablet 1  . tamsulosin (FLOMAX) 0.4 MG CAPS capsule Take 2 capsules (0.8 mg total) by mouth every morning. 180 capsule 1  . trimethoprim (TRIMPEX) 100 MG tablet Take 1 tablet by mouth daily.     Allergies  Allergen Reactions  . Hydromorphone Itching  . Oxycodone Itching and Nausea Only   Social History   Social History  . Marital status: Married  . Number of children: 3  . Years of education: 12   Occupational History  .  Retired    Social History Main Topics  . Smoking status: Former Smoker    Packs/day: 1.50    Years: 55.00    Types: Cigarettes    Quit date: 05/27/2013  . Smokeless tobacco: Current User    Types: Chew     Comment: occasional chew tobacco  . Alcohol use No  . Drug use: No   Social History Narrative   Lives at home w/ his wife   Right-handed   Drinks 1-2 sodas per day    Objective: VITAL SIGNS: BP (!) 143/73 (BP Location: Right Arm, Patient Position: Sitting, Cuff Size: Normal)   Pulse 74   Temp 97.7 F (36.5 C) (Oral)   Resp 14   Wt 220 lb (99.8 kg)   SpO2 97%   BMI 36.61 kg/m  Constitutional: Well formed, well developed. No acute  distress. Cardiovascular: Brisk cap refill Thorax & Lungs: No accessory muscle use Skin: Warm. Dry. No erythema. No rash.  Musculoskeletal: L shoulder.   Normal active range of motion: Yes   Normal passive range of motion: Yes Tenderness to palpation: no Deformity: no Ecchymosis: no Tests positive: Neer's, Hawkins, Empty Can Tests negative:Speed's, Cross Over, Lift off, Obrien's Neurologic: Normal sensory function. No focal deficits noted. DTR's equal and symmetry in UE's. No clonus. Psychiatric: Normal mood. Age appropriate judgment and insight. Alert & oriented x 3.    Procedure Note; L Shoulder injection Verbal consent obtained. The area was palpated, an area was marked just caudal to the acromion process laterally, and cleaned with Alcohol x1. A 27-gauge needle was used to enter the joint laterally with ease. 40 mg of Depomedrol with 2 mL of 1% lidocaine was injected. The patient tolerated the procedure well. There were no complications noted.   Assessment:  Chronic left shoulder pain - Plan: DG Shoulder Left, PR DRAIN/INJECT LARGE JOINT/BURSA, CANCELED: DG Arthro Shoulder Left  Plan: Orders as above. Pt reported immediate improvement. I hope the steroid works. He is already receiving PT. Will consider repeating injection vs  referring to ortho/sports med for possible visco injections if they have samples or discussing possible surgical intervention, but he would like to avoid if possible.  F/u prn. The patient voiced understanding and agreement to the plan.   Jilda Roche [place], DO 09/29/16  2:52 PM

## 2016-09-29 NOTE — Addendum Note (Signed)
Addended by: Verdie ShireBAYNES, ANGELA M on: 09/29/2016 05:39 PM   Modules accepted: Orders

## 2016-10-07 ENCOUNTER — Telehealth: Payer: Self-pay | Admitting: Family Medicine

## 2016-10-07 NOTE — Telephone Encounter (Signed)
Please advise.//AB/CMA 

## 2016-10-07 NOTE — Telephone Encounter (Signed)
Caller name: Lupita LeashDonna Relationship to patient:  Wife Can be reached: [phone] Pharmacy:  Reason for call: Wife wants to know if patient can get another cortisone injection

## 2016-10-07 NOTE — Telephone Encounter (Signed)
Can't put anymore steroid in it for at least another 34 days. The steroid may take a few more days to work. TY.

## 2016-10-08 NOTE — Telephone Encounter (Signed)
Called and spoke with the pt's wife and informed her of the message below.  She verbalized understanding and stated that she will let the pt know.//AB/CMA

## 2016-10-26 ENCOUNTER — Ambulatory Visit: Payer: Medicare Other | Admitting: Neurology

## 2016-10-28 ENCOUNTER — Ambulatory Visit (INDEPENDENT_AMBULATORY_CARE_PROVIDER_SITE_OTHER): Payer: Medicare Other | Admitting: Family Medicine

## 2016-10-28 ENCOUNTER — Ambulatory Visit: Payer: Medicare Other | Admitting: Neurology

## 2016-10-28 ENCOUNTER — Telehealth: Payer: Self-pay

## 2016-10-28 ENCOUNTER — Encounter: Payer: Self-pay | Admitting: Family Medicine

## 2016-10-28 VITALS — BP 118/78 | HR 79 | Temp 98.1°F | Ht 65.0 in | Wt 211.2 lb

## 2016-10-28 DIAGNOSIS — M25512 Pain in left shoulder: Secondary | ICD-10-CM | POA: Diagnosis not present

## 2016-10-28 DIAGNOSIS — G8929 Other chronic pain: Secondary | ICD-10-CM | POA: Diagnosis not present

## 2016-10-28 MED ORDER — DICLOFENAC SODIUM 1 % TD GEL: 2.0000 g | Freq: Four times a day (QID) | TRANSDERMAL | 1 refills | Status: DC

## 2016-10-28 NOTE — Progress Notes (Signed)
Chief Complaint  Patient presents with  . Shoulder Pain    left -- ongoing pain, no better   Patient continues of shoulder pain. He was given a steroid injection at the last visit that helped for around one day. It wakes him up at night. He's had physical therapy the past that did not go well. He has not been given home stretches and exercises for this issue. No new symptoms.  ROS:  MSK: As noted in HPI  BP 118/78 (BP Location: Right Arm, Patient Position: Sitting, Cuff Size: Normal)   Pulse 79   Temp 98.1 F (36.7 C) (Oral)   Ht 5\' 5"  (1.651 m)   Wt 211 lb 4 oz (95.8 kg)   SpO2 97%   BMI 35.15 kg/m  Gen- awake, alert Msk- +Neer's, Empty can, Hawkins on L, neg Cross over, Speed's, modified Lift off in the front, no TTP  Psych- age appropriate judgment and insight  Chronic left shoulder pain - Plan: MR SHOULDER LEFT W CONTRAST, diclofenac sodium (VOLTAREN) 1 % GEL  Orders as above. He does have hardware, states he has received MRI's in the past, despite this. Home stretches and exercises given. Topical voltaren gel.  F/u prn. The patient voiced understanding and agreement to the plan.  Jilda Rocheicholas Paul DiazWendling, DO 10/28/16 4:32 PM

## 2016-10-28 NOTE — Patient Instructions (Addendum)
If you do not hear anything about your MRI in the next week, call our office and ask for an update.  EXERCISES  RANGE OF MOTION (ROM) AND STRETCHING EXERCISES These exercises may help you when beginning to rehabilitate your injury. Your symptoms may resolve with or without further involvement from your physician, physical therapist or athletic trainer. While completing these exercises, remember:   Restoring tissue flexibility helps normal motion to return to the joints. This allows healthier, less painful movement and activity.  An effective stretch should be held for at least 30 seconds.  A stretch should never be painful. You should only feel a gentle lengthening or release in the stretched tissue.  ROM - Pendulum  Bend at the waist so that your right / left arm falls away from your body. Support yourself with your opposite hand on a solid surface, such as a table or a countertop.  Your right / left arm should be perpendicular to the ground. If it is not perpendicular, you need to lean over farther. Relax the muscles in your right / left arm and shoulder as much as possible.  Gently sway your hips and trunk so they move your right / left arm without any use of your right / left shoulder muscles.  Progress your movements so that your right / left arm moves side to side, then forward and backward, and finally, both clockwise and counterclockwise.  Complete 10-15 repetitions in each direction. Many people use this exercise to relieve discomfort in their shoulder as well as to gain range of motion. Repeat 2-3 times. Complete this exercise once per day.  STRETCH - Flexion, Standing  Stand with good posture. With an underhand grip on your right / left hand and an overhand grip on the opposite hand, grasp a broomstick or cane so that your hands are a little more than shoulder-width apart.  Keeping your right / left elbow straight and shoulder muscles relaxed, push the stick with your opposite  hand to raise your right / left arm in front of your body and then overhead. Raise your arm until you feel a stretch in your right / left shoulder, but before you have increased shoulder pain.  Try to avoid shrugging your right / left shoulder as your arm rises by keeping your shoulder blade tucked down and toward your mid-back spine. Hold 15-20 seconds.  Slowly return to the starting position. Repeat 2-3 times. Complete this exercise once per day.  STRETCH - Internal Rotation  Place your right / left hand behind your back, palm-up.  Throw a towel or belt over your opposite shoulder. Grasp the towel/belt with your right / left hand.  While keeping an upright posture, gently pull up on the towel/belt until you feel a stretch in the front of your right / left shoulder.  Avoid shrugging your right / left shoulder as your arm rises by keeping your shoulder blade tucked down and toward your mid-back spine.  Hold 15-20. Release the stretch by lowering your opposite hand. Repeat 2-3 times. Complete this exercise once per day.  STRETCH - External Rotation and Abduction  Stagger your stance through a doorframe. It does not matter which foot is forward.  As instructed by your physician, physical therapist or athletic trainer, place your hands: ? And forearms above your head and on the door frame. ? And forearms at head-height and on the door frame. ? At elbow-height and on the door frame.  Keeping your head and chest upright  and your stomach muscles tight to prevent over-extending your low-back, slowly shift your weight onto your front foot until you feel a stretch across your chest and/or in the front of your shoulders.  Hold 15-20 seconds. Shift your weight to your back foot to release the stretch. Repeat 2-3 times. Complete this stretch once per day.   STRENGTHENING EXERCISES  These exercises may help you when beginning to rehabilitate your injury. They may resolve your symptoms with or  without further involvement from your physician, physical therapist or athletic trainer. While completing these exercises, remember:   Muscles can gain both the endurance and the strength needed for everyday activities through controlled exercises.  Complete these exercises as instructed by your physician, physical therapist or athletic trainer. Progress the resistance and repetitions only as guided.  You may experience muscle soreness or fatigue, but the pain or discomfort you are trying to eliminate should never worsen during these exercises. If this pain does worsen, stop and make certain you are following the directions exactly. If the pain is still present after adjustments, discontinue the exercise until you can discuss the trouble with your clinician.  If advised by your physician, during your recovery, avoid activity or exercises which involve actions that place your right / left hand or elbow above your head or behind your back or head. These positions stress the tissues which are trying to heal.  STRENGTH - Scapular Depression and Adduction  With good posture, sit on a firm chair. Supported your arms in front of you with pillows, arm rests or a table top. Have your elbows in line with the sides of your body.  Gently draw your shoulder blades down and toward your mid-back spine. Gradually increase the tension without tensing the muscles along the top of your shoulders and the back of your neck.  Hold for 5 seconds. Slowly release the tension and relax your muscles completely before completing the next repetition.  After you have practiced this exercise, remove the arm support and complete it in standing as well as sitting. Repeat 2 times. Complete this exercise every other day.   STRENGTH - External Rotators  Secure a rubber exercise band/tubing to a fixed object so that it is at the same height as your right / left elbow when you are standing or sitting on a firm surface.  Stand or  sit so that the secured exercise band/tubing is at your side that is not injured.  Bend your elbow 90 degrees. Place a folded towel or small pillow under your right / left arm so that your elbow is a few inches away from your side.  Keeping the tension on the exercise band/tubing, pull it away from your body, as if pivoting on your elbow. Be sure to keep your body steady so that the movement is only coming from your shoulder rotating.  Hold 5 seconds. Release the tension in a controlled manner as you return to the starting position. Repeat 2 times. Complete this exercise every other day.   STRENGTH - Supraspinatus  Stand or sit with good posture. Grasp a 2-3 lb weight or an exercise band/tubing so that your hand is "thumbs-up," like when you shake hands.  Slowly lift your right / left hand from your thigh into the air, traveling about 30 degrees from straight out at your side. Lift your hand to shoulder height or as far as you can without increasing any shoulder pain. Initially, many people do not lift their hands above shoulder  height.  Avoid shrugging your right / left shoulder as your arm rises by keeping your shoulder blade tucked down and toward your mid-back spine.  Hold for 4-5 seconds. Control the descent of your hand as you slowly return to your starting position. Repeat 2 times. Complete this exercise every other day.   STRENGTH - Shoulder Extensors  Secure a rubber exercise band/tubing so that it is at the height of your shoulders when you are either standing or sitting on a firm arm-less chair.  With a thumbs-up grip, grasp an end of the band/tubing in each hand. Straighten your elbows and lift your hands straight in front of you at shoulder height. Step back away from the secured end of band/tubing until it becomes tense.  Squeezing your shoulder blades together, pull your hands down to the sides of your thighs. Do not allow your hands to go behind you.  Hold for 5 seconds.  Slowly ease the tension on the band/tubing as you reverse the directions and return to the starting position. Repeat 2 times. Complete this exercise every other day.   STRENGTH - Scapular Retractors  Secure a rubber exercise band/tubing so that it is at the height of your shoulders when you are either standing or sitting on a firm arm-less chair.  With a palm-down grip, grasp an end of the band/tubing in each hand. Straighten your elbows and lift your hands straight in front of you at shoulder height. Step back away from the secured end of band/tubing until it becomes tense.  Squeezing your shoulder blades together, draw your elbows back as you bend them. Keep your upper arm lifted away from your body throughout the exercise.  Hold 5 seconds. Slowly ease the tension on the band/tubing as you reverse the directions and return to the starting position. Repeat 2 times. Complete this exercise every other day.  STRENGTH - Scapular Depressors  Find a sturdy chair without wheels, such as a from a dining room table.  Keeping your feet on the floor, lift your bottom from the seat and lock your elbows.  Keeping your elbows straight, allow gravity to pull your body weight down. Your shoulders will rise toward your ears.  Raise your body against gravity by drawing your shoulder blades down your back, shortening the distance between your shoulders and ears. Although your feet should always maintain contact with the floor, your feet should progressively support less body weight as you get stronger.  Hold 5 seconds. In a controlled and slow manner, lower your body weight to begin the next repetition. Repeat 2 times. Complete this exercise every other day.   This information is not intended to replace advice given to you by your health care provider. Make sure you discuss any questions you have with your health care provider.  Document Released: 02/17/2005 Document Revised: 04/26/2014 Document  Reviewed: 07/18/2008 Elsevier Interactive Patient Education [employer]

## 2016-10-28 NOTE — Telephone Encounter (Signed)
PA initiated via Covermymeds; KEY: [id number]. Received real-time response: PA approved.   Request Reference Number: [id number]. DICLOFENAC GEL 1% is approved through 04/18/2017. For further questions, call [phone]) [phone].

## 2016-10-28 NOTE — Progress Notes (Signed)
Pre visit review using our clinic review tool, if applicable. No additional management support is needed unless otherwise documented below in the visit note. 

## 2016-11-10 DIAGNOSIS — R339 Retention of urine, unspecified: Secondary | ICD-10-CM | POA: Diagnosis not present

## 2016-12-14 DIAGNOSIS — M4804 Spinal stenosis, thoracic region: Secondary | ICD-10-CM | POA: Diagnosis not present

## 2016-12-14 DIAGNOSIS — M47812 Spondylosis without myelopathy or radiculopathy, cervical region: Secondary | ICD-10-CM | POA: Diagnosis not present

## 2016-12-14 DIAGNOSIS — M48061 Spinal stenosis, lumbar region without neurogenic claudication: Secondary | ICD-10-CM | POA: Diagnosis not present

## 2016-12-23 ENCOUNTER — Telehealth: Payer: Self-pay | Admitting: Family Medicine

## 2016-12-23 MED ORDER — FEBUXOSTAT 40 MG PO TABS: 40.0000 mg | ORAL_TABLET | Freq: Every day | ORAL | 2 refills | Status: DC | PRN

## 2016-12-23 NOTE — Telephone Encounter (Signed)
Sent in refill/ patient notified 

## 2016-12-23 NOTE — Telephone Encounter (Signed)
Self.    Refill for febuxostat   Pharmacy: [facility] - [place] - [id number]

## 2016-12-31 DIAGNOSIS — R339 Retention of urine, unspecified: Secondary | ICD-10-CM | POA: Diagnosis not present

## 2017-01-17 DIAGNOSIS — G629 Polyneuropathy, unspecified: Secondary | ICD-10-CM | POA: Diagnosis not present

## 2017-01-29 ENCOUNTER — Ambulatory Visit (HOSPITAL_BASED_OUTPATIENT_CLINIC_OR_DEPARTMENT_OTHER): Payer: Medicare Other

## 2017-02-08 ENCOUNTER — Ambulatory Visit (HOSPITAL_BASED_OUTPATIENT_CLINIC_OR_DEPARTMENT_OTHER)
Admission: RE | Admit: 2017-02-08 | Discharge: 2017-02-08 | Disposition: A | Discharge status: Home / Self Care | Payer: Medicare Other | Source: Ambulatory Visit | Attending: Family Medicine | Admitting: Family Medicine

## 2017-02-08 DIAGNOSIS — I2510 Atherosclerotic heart disease of native coronary artery without angina pectoris: Secondary | ICD-10-CM | POA: Diagnosis not present

## 2017-02-08 DIAGNOSIS — J439 Emphysema, unspecified: Secondary | ICD-10-CM | POA: Insufficient documentation

## 2017-02-08 DIAGNOSIS — R918 Other nonspecific abnormal finding of lung field: Secondary | ICD-10-CM | POA: Diagnosis not present

## 2017-02-09 ENCOUNTER — Other Ambulatory Visit: Payer: Self-pay | Admitting: Family Medicine

## 2017-02-09 ENCOUNTER — Telehealth: Payer: Self-pay | Admitting: Family Medicine

## 2017-02-09 MED ORDER — ATORVASTATIN CALCIUM 20 MG PO TABS: 20.0000 mg | ORAL_TABLET | Freq: Every day | ORAL | 2 refills | Status: DC

## 2017-02-09 NOTE — Telephone Encounter (Signed)
See CT result note

## 2017-02-09 NOTE — Telephone Encounter (Signed)
Pt wife returned call to AMR CorporationWendling's assistant for results.

## 2017-02-14 ENCOUNTER — Other Ambulatory Visit: Payer: Self-pay | Admitting: Family Medicine

## 2017-03-01 DIAGNOSIS — R339 Retention of urine, unspecified: Secondary | ICD-10-CM | POA: Diagnosis not present

## 2017-03-07 ENCOUNTER — Other Ambulatory Visit: Payer: Self-pay | Admitting: Family Medicine

## 2017-03-07 ENCOUNTER — Ambulatory Visit (INDEPENDENT_AMBULATORY_CARE_PROVIDER_SITE_OTHER): Payer: Medicare Other | Admitting: Family Medicine

## 2017-03-07 ENCOUNTER — Encounter: Payer: Self-pay | Admitting: Family Medicine

## 2017-03-07 VITALS — BP 120/72 | HR 67 | Temp 98.0°F | Ht 65.0 in | Wt 202.0 lb

## 2017-03-07 DIAGNOSIS — G8929 Other chronic pain: Secondary | ICD-10-CM | POA: Diagnosis not present

## 2017-03-07 DIAGNOSIS — Z23 Encounter for immunization: Secondary | ICD-10-CM

## 2017-03-07 DIAGNOSIS — J069 Acute upper respiratory infection, unspecified: Secondary | ICD-10-CM | POA: Diagnosis not present

## 2017-03-07 DIAGNOSIS — H9193 Unspecified hearing loss, bilateral: Secondary | ICD-10-CM

## 2017-03-07 DIAGNOSIS — B9789 Other viral agents as the cause of diseases classified elsewhere: Secondary | ICD-10-CM | POA: Diagnosis not present

## 2017-03-07 DIAGNOSIS — J439 Emphysema, unspecified: Secondary | ICD-10-CM

## 2017-03-07 DIAGNOSIS — M25512 Pain in left shoulder: Secondary | ICD-10-CM | POA: Diagnosis not present

## 2017-03-07 MED ORDER — DICLOFENAC SODIUM 1 % TD GEL: 2.0000 g | Freq: Four times a day (QID) | TRANSDERMAL | 2 refills | Status: DC

## 2017-03-07 MED ORDER — ALBUTEROL SULFATE HFA 108 (90 BASE) MCG/ACT IN AERS: 2.0000 | INHALATION_SPRAY | Freq: Four times a day (QID) | RESPIRATORY_TRACT | 0 refills | Status: DC | PRN

## 2017-03-07 NOTE — Progress Notes (Signed)
Pre visit review using our clinic review tool, if applicable. No additional management support is needed unless otherwise documented below in the visit note. 

## 2017-03-07 NOTE — Addendum Note (Signed)
Addended by: Scharlene GlossEWING, Devynn Scheff B on: 03/07/2017 03:00 PM   Modules accepted: Orders

## 2017-03-07 NOTE — Progress Notes (Signed)
Chief Complaint  Patient presents with  . Cough    Evan [name] here for URI complaints.  Duration: 2 weakness  Associated symptoms: cough and watery eyes Denies: sinus headache, sinus congestion, sinus pain, rhinorrhea, ear pain, ear drainage, sore throat, shortness of breath, myalgia and fevers/rigors Treatment to date: None Sick contacts: No   Worsening hearing loss b/l. He has to turn the tv much higher than what the rest of the room requires. No recently loud noise exposure, asa use, or injury. He used to see an audiologist many years ago and was rx'd hearing aids. He wishes to go back.   L shoulder pain improved with topical NSAID, requesting refill.   ROS:  Const: Denies fevers HEENT: As noted in HPI Lungs: No SOB  Past Medical History:  Diagnosis Date  . Abnormality of gait    multiple cervical spondylosis  . Anxiety   . Bilateral leg weakness    due to back surgeries--  mostly uses wheelchair and at home very short distant w/ walker  . BPH (benign prostatic hyperplasia)   . Cervical spondylosis without myelopathy    per dr cram (neuro surg.)  . Chronic back pain   . Chronic gout   . Depression   . Diverticulosis of colon   . Emphysema/COPD (HCC)   . Full dentures   . GERD (gastroesophageal reflux disease)   . Hard of hearing    does not wear hearing aides  . Hemorrhoids   . Hyperlipidemia   . Lower urinary tract symptoms (LUTS)   . Numbness    hands and related to neck  . OA (osteoarthritis)    hands  . Self-catheterizes urinary bladder   . Wears glasses    Family History  Problem Relation Age of Onset  . Diabetes Mother   . Parkinson's disease Father   . Heart disease Father     BP 120/72 (BP Location: Left Arm, Patient Position: Sitting, Cuff Size: Large)   Pulse 67   Temp 98 F (36.7 C) (Oral)   Ht 5\' 5"  (1.651 m)   Wt 202 lb (91.6 kg)   SpO2 97%   BMI 33.61 kg/m  General: Awake, alert, appears stated age HEENT: AT, [place], ears  patent b/l and TM's neg, nares patent w/o discharge, pharynx pink and without exudates, MMM Neck: No masses or asymmetry Heart: RRR, no murmurs, no bruits Lungs: CTAB, no accessory muscle use Psych: Age appropriate judgment and insight, normal mood and affect  Viral URI with cough  Chronic left shoulder pain - Plan: diclofenac sodium (VOLTAREN) 1 % GEL  Pulmonary emphysema, unspecified emphysema type (HCC)  Bilateral hearing loss, unspecified hearing loss type - Plan: Ambulatory referral to Audiology  Albuterol for cough and emphysema. Continue to push fluids, practice good hand hygiene, cover mouth when coughing. F/u prn. If starting to experience fevers, shaking, or shortness of breath, seek immediate care. Pt voiced understanding and agreement to the plan.  Jilda Rocheicholas Paul AldoraWendling, DO 03/07/17 2:06 PM

## 2017-03-07 NOTE — Patient Instructions (Addendum)
Continue to push fluids, practice good hand hygiene, and cover your mouth if you cough.  If you start having fevers, shaking or shortness of breath, seek immediate care.  Let me know tomorrow or very early Wed if the cough comes back or anything new arises.  You will not feel well over the next 1-2 days from the Shingles vaccine.  If you do not hear anything about your referral in the next 1-2 weeks, call our office and ask for an update.  Let us know if you need anything.

## 2017-04-13 ENCOUNTER — Telehealth: Payer: Self-pay | Admitting: Family Medicine

## 2017-04-13 ENCOUNTER — Other Ambulatory Visit: Payer: Self-pay | Admitting: Family Medicine

## 2017-04-13 DIAGNOSIS — J449 Chronic obstructive pulmonary disease, unspecified: Secondary | ICD-10-CM

## 2017-04-13 NOTE — Telephone Encounter (Signed)
Copied from CRM [id number]. Topic: Referral - Request >> Apr 13, 2017  9:54 AM Evan [name], Evan [name] wrote: CRM for notification. See Telephone encounter for: 04/13/17. Patient wife called and need a referral to see pulmonary and [facility]. Patient has appt on 04/29/17 at 11:30a.m.

## 2017-04-13 NOTE — Telephone Encounter (Signed)
For COPD? OK.

## 2017-04-13 NOTE — Telephone Encounter (Signed)
Referral done

## 2017-04-21 ENCOUNTER — Ambulatory Visit: Payer: Medicare Other | Admitting: Family Medicine

## 2017-04-25 ENCOUNTER — Ambulatory Visit: Payer: Medicare Other | Admitting: Family Medicine

## 2017-04-25 ENCOUNTER — Encounter: Payer: Self-pay | Admitting: Family Medicine

## 2017-04-25 VITALS — BP 118/62 | HR 71 | Temp 98.5°F | Ht 65.0 in | Wt 202.0 lb

## 2017-04-25 DIAGNOSIS — R109 Unspecified abdominal pain: Secondary | ICD-10-CM | POA: Diagnosis not present

## 2017-04-25 DIAGNOSIS — H9193 Unspecified hearing loss, bilateral: Secondary | ICD-10-CM | POA: Insufficient documentation

## 2017-04-25 NOTE — Patient Instructions (Addendum)
Expect a call in the next 1-2 days regarding your ultrasound.   Don't worry about the belly button.  OK to use Debrox (peroxide) in the ear to loosen up wax. Also recommend using a bulb syringe (for removing boogers from baby's noses) to flush through warm water. Do not use Q-tips as this can impact wax further.  Let us know if you need anything.

## 2017-04-25 NOTE — Progress Notes (Signed)
Pre visit review using our clinic review tool, if applicable. No additional management support is needed unless otherwise documented below in the visit note. 

## 2017-04-25 NOTE — Progress Notes (Signed)
Chief Complaint  Patient presents with  . Pain    left side    Subjective: Patient is a 77 y.o. male here for abd pain.  Feels that his belly button has shifted to the R, was told in past linea alba (pt described as center tissue that separates abs down middle) has stretched out. He feels a central bulge and is having pain, worse when he coughs.  He sees a pulmonologist on Friday.  His defecation is normal.  He denies any fevers, skin changes, or urinary issues that are new.  R ear issue- feels like the opening has gotten small and worsening over the past 3-4 months. No pain, fevers, allergies, or drainage.  He has an appointment with an audiologist in February.  His wife thinks he is just losing his hearing.  He does have an issue with wax buildup.  ROS: GI: As noted in HPI ENT: +hearing loss b/l  Family History  Problem Relation Age of Onset  . Diabetes Mother   . Parkinson's disease Father   . Heart disease Father    Past Medical History:  Diagnosis Date  . Abnormality of gait    multiple cervical spondylosis  . Anxiety   . Bilateral leg weakness    due to back surgeries--  mostly uses wheelchair and at home very short distant w/ walker  . BPH (benign prostatic hyperplasia)   . Cervical spondylosis without myelopathy    per dr cram (neuro surg.)  . Chronic back pain   . Chronic gout   . Depression   . Diverticulosis of colon   . Emphysema/COPD (HCC)   . Full dentures   . GERD (gastroesophageal reflux disease)   . Hard of hearing    does not wear hearing aides  . Hemorrhoids   . Hyperlipidemia   . Lower urinary tract symptoms (LUTS)   . Numbness    hands and related to neck  . OA (osteoarthritis)    hands  . Self-catheterizes urinary bladder   . Wears glasses    Allergies  Allergen Reactions  . Hydromorphone Itching  . Oxycodone Itching and Nausea Only    Current Outpatient Medications:  .  albuterol (PROVENTIL HFA;VENTOLIN HFA) 108 (90 Base) MCG/ACT  inhaler, Inhale 2 puffs every 6 (six) hours as needed into the lungs for wheezing or shortness of breath., Disp: 1 Inhaler, Rfl: 0 .  atorvastatin (LIPITOR) 20 MG tablet, TAKE 1 TABLET BY MOUTH DAILY, Disp: 30 tablet, Rfl: 2 .  carbidopa-levodopa (SINEMET IR) 25-100 MG tablet, Take 1 tablet by mouth 3 (three) times daily., Disp: 90 tablet, Rfl: 5 .  colchicine 0.6 MG tablet, TAKE 1 TABLET BY MOUTH EVERY 2 HOURS UNTIL RELIEF as needed, Disp: 30 tablet, Rfl: 0 .  diazepam (VALIUM) 5 MG tablet, Take 1 tablet by mouth daily as needed. Neck pain, Disp: , Rfl:  .  diclofenac sodium (VOLTAREN) 1 % GEL, Apply 2 g 4 (four) times daily topically., Disp: 1 Tube, Rfl: 2 .  febuxostat (ULORIC) 40 MG tablet, Take 1 tablet (40 mg total) by mouth daily as needed., Disp: 30 tablet, Rfl: 2 .  Multiple Vitamins-Minerals (CENTRUM SILVER ADULT 50+ PO), Take 1 tablet by mouth daily., Disp: , Rfl:  .  niacin 500 MG tablet, Take 500 mg by mouth every morning. , Disp: , Rfl:  .  Omega-3 Fatty Acids (FISH OIL) 1000 MG CAPS, Take 2,000 mg by mouth every morning. , Disp: , Rfl:  .  omeprazole (  PRILOSEC) 20 MG capsule, Take 1 capsule (20 mg total) by mouth every morning., Disp: 90 capsule, Rfl: 1 .  Polyethyl Glycol-Propyl Glycol (SYSTANE OP), Place 2 drops into both eyes at bedtime as needed. , Disp: , Rfl:  .  Probiotic Product (PROBIOTIC PO), Take 1 capsule by mouth every morning. , Disp: , Rfl:  .  sertraline (ZOLOFT) 25 MG tablet, TAKE 1 TABLET BY MOUTH DAILY, Disp: 90 tablet, Rfl: 0 .  tamsulosin (FLOMAX) 0.4 MG CAPS capsule, TAKE 2 CAPSULES BY MOUTH EVERY MORNING, Disp: 180 capsule, Rfl: 0 .  trimethoprim (TRIMPEX) 100 MG tablet, Take 1 tablet by mouth daily., Disp: , Rfl:   Objective: BP 118/62 (BP Location: Left Arm, Patient Position: Sitting, Cuff Size: Normal)   Pulse 71   Temp 98.5 F (36.9 C) (Oral)   Ht 5\' 5"  (1.651 m)   Wt 202 lb (91.6 kg)   SpO2 95%   BMI 33.61 kg/m  General: Awake, appears stated  age HEENT: MMM, EOMi, ears neg b/l, small piece of cerumen in R canal 40% Heart: RRR Lungs: CTAB, no rales, wheezes or rhonchi. No accessory muscle use Abd: BS+, soft, TTP over umbilical region, ND, no masses or organomegaly Skin: umbilicus slightly to R of midline, no erythema Psych: Age appropriate judgment and insight, normal affect and mood  Assessment and Plan: Abdominal pain, unspecified abdominal location - Plan: US Abdomen Limited  Bilateral hearing loss, unspecified hearing loss type  Orders as above. Ck US, could be related to abd wall strain 2/2 cough though.  Cerumen home care verbalized and written.  F/u prn.  The patient voiced understanding and agreement to the plan.  Jilda Rocheicholas Paul BonhamWendling, DO 04/25/17  4:20 PM

## 2017-04-26 ENCOUNTER — Ambulatory Visit (HOSPITAL_BASED_OUTPATIENT_CLINIC_OR_DEPARTMENT_OTHER)
Admission: RE | Admit: 2017-04-26 | Discharge: 2017-04-26 | Disposition: A | Discharge status: Home / Self Care | Payer: Medicare Other | Source: Ambulatory Visit | Attending: Family Medicine | Admitting: Family Medicine

## 2017-04-26 DIAGNOSIS — R109 Unspecified abdominal pain: Secondary | ICD-10-CM | POA: Insufficient documentation

## 2017-04-26 DIAGNOSIS — R1033 Periumbilical pain: Secondary | ICD-10-CM | POA: Diagnosis not present

## 2017-04-28 ENCOUNTER — Ambulatory Visit: Payer: Medicare Other | Admitting: Family Medicine

## 2017-04-28 ENCOUNTER — Encounter: Payer: Self-pay | Admitting: Family Medicine

## 2017-04-28 VITALS — BP 121/64 | HR 86 | Temp 97.8°F | Ht 66.0 in | Wt 202.0 lb

## 2017-04-28 DIAGNOSIS — J441 Chronic obstructive pulmonary disease with (acute) exacerbation: Secondary | ICD-10-CM

## 2017-04-28 MED ORDER — ALBUTEROL SULFATE HFA 108 (90 BASE) MCG/ACT IN AERS: 2.0000 | INHALATION_SPRAY | Freq: Four times a day (QID) | RESPIRATORY_TRACT | 2 refills | Status: DC | PRN

## 2017-04-28 MED ORDER — IPRATROPIUM-ALBUTEROL 0.5-2.5 (3) MG/3ML IN SOLN
3.0000 mL | Freq: Once | RESPIRATORY_TRACT | Status: AC
  Administered 2017-04-28: 3 mL via RESPIRATORY_TRACT

## 2017-04-28 MED ORDER — IPRATROPIUM-ALBUTEROL 0.5-2.5 (3) MG/3ML IN SOLN: 3.0000 mL | Freq: Four times a day (QID) | RESPIRATORY_TRACT | 2 refills | Status: DC | PRN

## 2017-04-28 MED ORDER — DOXYCYCLINE HYCLATE 100 MG PO CAPS: 100.0000 mg | ORAL_CAPSULE | Freq: Two times a day (BID) | ORAL | 0 refills | Status: DC

## 2017-04-28 MED ORDER — PREDNISONE 20 MG PO TABS: ORAL_TABLET | ORAL | 0 refills | Status: DC

## 2017-04-28 NOTE — Patient Instructions (Addendum)
It was good to see you today- I am sorry that you are feeling so bad!  Please use the inhaler every 4-6 hours as needed for wheezing and cough Use the doxycycline and prednisone as directed I think a nebulizer machine may be helpful for you- ask Dr. Isaiah SergeMannam about this or you can buy one/ order online   If you are not doing ok please go to the ER!

## 2017-04-28 NOTE — Progress Notes (Signed)
[facility] [address], [address] [place], [place] [address] [phone]  Date:  04/28/2017   Name:  Evan [name]   DOB:  1940-06-24   MRN:  [id number]  PCP:  Sharlene Dory, DO    Chief Complaint: Cough (Pt states that cough started about 2 months ago but got worse about 2 days ago. Appt with Pulm tmrw) and Sore Throat (Onset 1 week )   History of Present Illness:  Evan [name] is a 77 y.o. very pleasant male patient who presents with the following:  Pt of Dr. Carmelia Roller here today with concern of illness He was seen by Dr. Carmelia Roller on 1/7 with complaint of abd pain  He has a history of emphysema and COPD; he is seeing pulmonology tomorrow to establish care He has noted a cough for a couple of months, but getting worse the last 3 days No fever The cough had been productive but not any longer He has noted wheezing He has not really noted stuffy runny nose  He is using his albuterol which helps for a bit- he is about out of the one he has at home. Needs a RF He is not on any other inhalers such as spiriva  He uses a WC due to his spine issues.  He has had a lot of back and neck fusions/ operations in the past  Former heavy smoker - he quit about 3 years ago His wife (who is here today and contributes to the history) quit as well  He did see pulmonology several years ago- they had forgotten about this until recently He does self- cath once at bedtime to completely empty his bladder   Patient Active Problem List   Diagnosis Date Noted  . Bilateral hearing loss 04/25/2017  . Parkinsonism (HCC) 09/03/2016  . Right leg weakness 09/03/2016  . Diverticulosis of colon with hemorrhage   . UTI (urinary tract infection) 12/09/2015  . Rectal bleed 12/07/2015  . Chronic diastolic CHF (congestive heart failure) (HCC) 12/07/2015  . Rectal bleeding 12/07/2015  . Abnormality of gait 10/29/2015  .  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 Weakness of both legs 07/27/2015  . Shortness of breath on exertion 07/24/2015  . Tremor 07/24/2015  . Acute stress reaction 07/24/2015  . Screening, ischemic heart disease 01/29/2015  . Hernia of abdominal cavity 12/20/2014  . Gastroesophageal reflux disease without esophagitis 09/03/2014  . Depression with anxiety 09/03/2014  . BPH (benign prostatic hyperplasia) 09/03/2014  . Gout, chronic 09/03/2014  . Spondylolisthesis at L4-L5 level 07/22/2014  . Diastolic dysfunction with heart failure (HCC) 01/01/2014  . Peripheral neuropathy 01/01/2014  . DOE (dyspnea on exertion) 11/15/2013  . Leg edema 11/15/2013  . Snoring 11/15/2013  . COPD pfts pending  05/10/2013    Past Medical History:  Diagnosis Date  . Abnormality of gait    multiple cervical spondylosis  . Anxiety   . Bilateral leg weakness    due to back surgeries--  mostly uses wheelchair and at home very short distant w/ walker  . BPH (benign prostatic hyperplasia)   . Cervical spondylosis without myelopathy    per dr cram (neuro surg.)  . Chronic back pain   . Chronic gout   . Depression   . Diverticulosis of colon   . Emphysema/COPD (HCC)   . Full dentures   . GERD (gastroesophageal reflux disease)   . Hard of hearing  does not wear hearing aides  . Hemorrhoids   . Hyperlipidemia   . Lower urinary tract symptoms (LUTS)   . Numbness    hands and related to neck  . OA (osteoarthritis)    hands  . Self-catheterizes urinary bladder   . Wears glasses     Past Surgical History:  Procedure Laterality Date  . ABDOMINAL HERNIA REPAIR  1970's  . ANTERIOR CERVICAL DECOMP/DISCECTOMY FUSION  07/03/2001   C4 -- C5/  post op Exploration and evacuation cervical hematoma  . BLEPHAROPLASTY  2008  . CARDIOVASCULAR STRESS TEST  05/27/1998   normal nuclear study w/ no ischemia/  normal LV function and wall motion , ef 64%  . CARPAL TUNNEL RELEASE Left 2003 approx.  .  CERVICAL FUSION  1995   C5 -- C6  . COLONOSCOPY WITH PROPOFOL N/A 02/10/2016   Procedure: COLONOSCOPY WITH PROPOFOL;  Surgeon: Hilarie FredricksonJohn N Perry, MD;  Location: [facility];  Service: Endoscopy;  Laterality: N/A;  . CYSTO/  LEFT RETROGRADE PYELOGRAM/  BALLOON DILATION LEFT URETER/  URETEROSCOPY/  STENT PLACEMENT  07/28/2000  . ESOPHAGOGASTRODUODENOSCOPY    . EXPLORATION AND RE-DO CERVICAL FUSION C4--5 AND ANTERIOR CERVIAL DISKECTOMY FUSION C3--4  08/09/2007   POST-OP EXPLORATION AND EVACUATION RETROPHARYNGEAL HEMATOMA  . LAPAROSCOPIC CHOLECYSTECTOMY  1990'S  . LUMBAR LAMINECTOMY/DECOMPRESSION MICRODISCECTOMY  10/29/2011   Procedure: LUMBAR LAMINECTOMY/DECOMPRESSION MICRODISCECTOMY 1 LEVEL;  Surgeon: Mariam DollarGary P Cram, MD;  Location: [facility];  Service: Neurosurgery;  Laterality: Right;  Right Lumbar Three-Four Lumbar Laminectomy/Microdiscectomy  . MAXIMUM ACCESS (MAS)POSTERIOR LUMBAR INTERBODY FUSION (PLIF) 1 LEVEL N/A 07/22/2014   Procedure: FOR MAXIMUM ACCESS (MAS) POSTERIOR LUMBAR INTERBODY FUSION (PLIF) 1 LEVEL;  Surgeon: Donalee CitrinGary Cram, MD;  Location: [facility];  Service: Neurosurgery;  Laterality: N/A;  FOR MAXIMUM ACCESS (MAS) POSTERIOR LUMBAR INTERBODY FUSION (PLIF) 1 LEVEL L4-5  . NEUROPLASTY / TRANSPOSITION ULNAR NERVE AT ELBOW Left 07/23/2008  . POSTERIOR CERVICAL FUSION/FORAMINOTOMY  06/09/2011   Procedure: POSTERIOR CERVICAL FUSION/FORAMINOTOMY LEVEL 4;  Surgeon: Mariam DollarGary P Cram, MD;  Location: [facility];  Service: Neurosurgery;  Laterality: N/A;  Cervical three to seven  posterior cervical fusion, Cervical four to seven Laminectomy, bone graft from right iliac crest   . POSTERIOR LUMBAR LAMINECTOMY AND DISKECTOMY  10/15/2009   L3 -- L4  . TONSILLECTOMY AND ADENOIDECTOMY  child  . TRANSTHORACIC ECHOCARDIOGRAM  11/27/2013   grade 1 diastolic dysfunction, ef 50-55%/  trivial AR and PR/ mild MR and TR/  PASP 31mmHg  . TRANSURETHRAL RESECTION OF PROSTATE N/A 05/31/2016   Procedure: TRANSURETHRAL  RESECTION OF THE PROSTATE (TURP);  Surgeon: Jethro BolusSigmund Tannenbaum, MD;  [facility];  Service: Urology;  Laterality: N/A;    Social History   Tobacco Use  . Smoking status: Former Smoker    Packs/day: 1.50    Years: 55.00    Pack years: 82.50    Types: Cigarettes    Last attempt to quit: 05/27/2013    Years since quitting: 3.9  . Smokeless tobacco: Current User    Types: Chew  . Tobacco comment: occasional chew tobacco  Substance Use Topics  . Alcohol use: No    Alcohol/week: 0.0 oz  . Drug use: No    Family History  Problem Relation Age of Onset  . Diabetes Mother   . Parkinson's disease Father   . Heart disease Father     Allergies  Allergen Reactions  . Hydromorphone Itching  . Oxycodone Itching and Nausea Only  Medication list has been reviewed and updated.  Current Outpatient Medications on File Prior to Visit  Medication Sig Dispense Refill  . albuterol (PROVENTIL HFA;VENTOLIN HFA) 108 (90 Base) MCG/ACT inhaler Inhale 2 puffs every 6 (six) hours as needed into the lungs for wheezing or shortness of breath. 1 Inhaler 0  . atorvastatin (LIPITOR) 20 MG tablet TAKE 1 TABLET BY MOUTH DAILY 30 tablet 2  . carbidopa-levodopa (SINEMET IR) 25-100 MG tablet Take 1 tablet by mouth 3 (three) times daily. 90 tablet 5  . colchicine 0.6 MG tablet TAKE 1 TABLET BY MOUTH EVERY 2 HOURS UNTIL RELIEF as needed 30 tablet 0  . diazepam (VALIUM) 5 MG tablet Take 1 tablet by mouth daily as needed. Neck pain    . diclofenac sodium (VOLTAREN) 1 % GEL Apply 2 g 4 (four) times daily topically. 1 Tube 2  . febuxostat (ULORIC) 40 MG tablet Take 1 tablet (40 mg total) by mouth daily as needed. 30 tablet 2  . FLUAD 0.5 ML SUSY ADM 0.5ML IM UTD  0  . Multiple Vitamins-Minerals (CENTRUM SILVER ADULT 50+ PO) Take 1 tablet by mouth daily.    . niacin 500 MG tablet Take 500 mg by mouth every morning.     . Omega-3 Fatty Acids (FISH OIL) 1000 MG CAPS Take 2,000 mg by mouth  every morning.     Marland Kitchen omeprazole (PRILOSEC) 20 MG capsule Take 1 capsule (20 mg total) by mouth every morning. 90 capsule 1  . Polyethyl Glycol-Propyl Glycol (SYSTANE OP) Place 2 drops into both eyes at bedtime as needed.     . Probiotic Product (PROBIOTIC PO) Take 1 capsule by mouth every morning.     . sertraline (ZOLOFT) 25 MG tablet TAKE 1 TABLET BY MOUTH DAILY 90 tablet 0  . SHINGRIX injection     . tamsulosin (FLOMAX) 0.4 MG CAPS capsule TAKE 2 CAPSULES BY MOUTH EVERY MORNING 180 capsule 0  . trimethoprim (TRIMPEX) 100 MG tablet Take 1 tablet by mouth daily.     No current facility-administered medications on file prior to visit.     Review of Systems:  As per HPI- otherwise negative. No fever noted    Physical Examination: Vitals:   04/28/17 1402  BP: 121/64  Pulse: 86  Temp: 97.8 F (36.6 C)  SpO2: 93%   Vitals:   04/28/17 1402  Weight: 202 lb (91.6 kg)  Height: 5\' 6"  (1.676 m)   Body mass index is 32.6 kg/m. Ideal Body Weight: Weight in (lb) to have BMI = 25: 154.6  GEN: WDWN, NAD, Non-toxic, A & O x 3, here today with his wife. Coughing in room today  HEENT: Atraumatic, Normocephalic. Neck supple. No masses, No LAD.  Bilateral TM wnl, oropharynx normal.  PEERL,EOMI.   Ears and Nose: No external deformity. CV: RRR, No M/G/R. No JVD. No thrill. No extra heart sounds. PULM:wheezes and rhonchi auscultated, but no crackles. No retractions. No resp. distress. No accessory muscle use. EXTR: No c/c.  1-2+ edema of both LE which is baseline per report  NEURO sitting in [facility] which is baseline per their report PSYCH: Normally interactive. Conversant. Not depressed or anxious appearing.  Calm demeanor.   duoneb treatment: he felt better following this Assessment and Plan: COPD exacerbation (HCC) - Plan: ipratropium-albuterol (DUONEB) 0.5-2.5 (3) MG/3ML nebulizer solution 3 mL, doxycycline (VIBRAMYCIN) 100 MG capsule, predniSONE (DELTASONE) 20 MG tablet,  ipratropium-albuterol (DUONEB) 0.5-2.5 (3) MG/3ML SOLN, albuterol (PROVENTIL HFA;VENTOLIN HFA) 108 (90 Base) MCG/ACT inhaler  Here today with likely COPD exacerbation Treat with doxycycline and prednisone Gave duoneb rx- they will work on getting a nebulizer He is seeing pulmonology tomorrow  BP Readings from Last 3 Encounters:  04/28/17 121/64  04/25/17 118/62  03/07/17 120/72     Meds ordered this encounter  Medications  . ipratropium-albuterol (DUONEB) 0.5-2.5 (3) MG/3ML nebulizer solution 3 mL  . doxycycline (VIBRAMYCIN) 100 MG capsule    Sig: Take 1 capsule (100 mg total) by mouth 2 (two) times daily.    Dispense:  20 capsule    Refill:  0  . predniSONE (DELTASONE) 20 MG tablet    Sig: Take 2 pills daily for 3 days, then 1 pill daily for 3 days    Dispense:  9 tablet    Refill:  0  . ipratropium-albuterol (DUONEB) 0.5-2.5 (3) MG/3ML SOLN    Sig: Take 3 mLs by nebulization every 6 (six) hours as needed.    Dispense:  360 mL    Refill:  2  . albuterol (PROVENTIL HFA;VENTOLIN HFA) 108 (90 Base) MCG/ACT inhaler    Sig: Inhale 2 puffs into the lungs every 6 (six) hours as needed for wheezing or shortness of breath.    Dispense:  1 Inhaler    Refill:  2     Signed Abbe Amsterdam, MD

## 2017-04-29 ENCOUNTER — Encounter: Payer: Self-pay | Admitting: Pulmonary Disease

## 2017-04-29 ENCOUNTER — Ambulatory Visit: Payer: Medicare Other | Admitting: Pulmonary Disease

## 2017-04-29 VITALS — BP 118/64 | HR 68 | Ht 66.0 in | Wt 220.0 lb

## 2017-04-29 DIAGNOSIS — J449 Chronic obstructive pulmonary disease, unspecified: Secondary | ICD-10-CM

## 2017-04-29 DIAGNOSIS — R0609 Other forms of dyspnea: Secondary | ICD-10-CM | POA: Diagnosis not present

## 2017-04-29 DIAGNOSIS — R0602 Shortness of breath: Secondary | ICD-10-CM | POA: Diagnosis not present

## 2017-04-29 MED ORDER — TIOTROPIUM BROMIDE-OLODATEROL 2.5-2.5 MCG/ACT IN AERS: 2.0000 | INHALATION_SPRAY | Freq: Every day | RESPIRATORY_TRACT | 5 refills | Status: DC

## 2017-04-29 NOTE — Patient Instructions (Addendum)
We will start you on stiolto inhaler Continue the albuterol inhaler and nebulizer Will order nebulizer machine to use with the medication We will schedule you for pulmonary function test Use Mucinex OTC. We will give a flutter valve Return to clinic in 1 month.

## 2017-04-29 NOTE — Progress Notes (Signed)
Evan HillockRichard A Betke    [id number]    07-10-1940  Primary Care Physician:Wendling, Jilda RocheNicholas Paul, DO  Referring Physician: Sharlene DoryWendling, Nicholas Paul, DO [address] [place], [place] [id number]  Chief complaint: Consult for COPD  HPI: 77 year old heavy smoker with emphysema, chronic bronchitis.  He has complained of cough for the past 3-4 months with white mucus, dyspnea, wheezing.  Denies any fevers, chills.  He was seen by primary care and given doxycycline and prednisone. He is on albuterol inhaler and never been on controller medication.  He was evaluated in the pulmonary clinic in 2015 and PFTs ordered but did not get done  Pets: 2 dogs.  No birds, farm animals Occupation: Retired Academic librarianjeweler, Corporate investment bankerconstruction worker Exposures: Exposure to asbestos while working in [employer] representative[employer] Smoking history: 150-pack-year smoking history.  Quit in 2016 Travel History: Not significant  Outpatient Encounter Medications as of 04/29/2017  Medication Sig  . albuterol (PROVENTIL HFA;VENTOLIN HFA) 108 (90 Base) MCG/ACT inhaler Inhale 2 puffs into the lungs every 6 (six) hours as needed for wheezing or shortness of breath.  Marland Kitchen. atorvastatin (LIPITOR) 20 MG tablet TAKE 1 TABLET BY MOUTH DAILY  . carbidopa-levodopa (SINEMET IR) 25-100 MG tablet Take 1 tablet by mouth 3 (three) times daily.  . colchicine 0.6 MG tablet TAKE 1 TABLET BY MOUTH EVERY 2 HOURS UNTIL RELIEF as needed  . diazepam (VALIUM) 5 MG tablet Take 1 tablet by mouth daily as needed. Neck pain  . diclofenac sodium (VOLTAREN) 1 % GEL Apply 2 g 4 (four) times daily topically.  Marland Kitchen. doxycycline (VIBRAMYCIN) 100 MG capsule Take 1 capsule (100 mg total) by mouth 2 (two) times daily.  . febuxostat (ULORIC) 40 MG tablet Take 1 tablet (40 mg total) by mouth daily as needed.  Marland Kitchen. FLUAD 0.5 ML SUSY ADM 0.5ML IM UTD  . ipratropium-albuterol (DUONEB) 0.5-2.5 (3) MG/3ML SOLN Take 3 mLs by nebulization every 6 (six) hours as needed.  .  Multiple Vitamins-Minerals (CENTRUM SILVER ADULT 50+ PO) Take 1 tablet by mouth daily.  . niacin 500 MG tablet Take 500 mg by mouth every morning.   . Omega-3 Fatty Acids (FISH OIL) 1000 MG CAPS Take 2,000 mg by mouth every morning.   Marland Kitchen. omeprazole (PRILOSEC) 20 MG capsule Take 1 capsule (20 mg total) by mouth every morning.  Bertram Gala. Polyethyl Glycol-Propyl Glycol (SYSTANE OP) Place 2 drops into both eyes at bedtime as needed.   . predniSONE (DELTASONE) 20 MG tablet Take 2 pills daily for 3 days, then 1 pill daily for 3 days  . Probiotic Product (PROBIOTIC PO) Take 1 capsule by mouth every morning.   . sertraline (ZOLOFT) 25 MG tablet TAKE 1 TABLET BY MOUTH DAILY  . SHINGRIX injection   . tamsulosin (FLOMAX) 0.4 MG CAPS capsule TAKE 2 CAPSULES BY MOUTH EVERY MORNING  . trimethoprim (TRIMPEX) 100 MG tablet Take 1 tablet by mouth daily.   No facility-administered encounter medications on file as of 04/29/2017.     Allergies as of 04/29/2017 - Review Complete 04/29/2017  Allergen Reaction Noted  . Hydromorphone Itching 12/20/2014  . Oxycodone Itching and Nausea Only 10/27/2011    Past Medical History:  Diagnosis Date  . Abnormality of gait    multiple cervical spondylosis  . Anxiety   . Bilateral leg weakness    due to back surgeries--  mostly uses wheelchair and at home very short distant w/ walker  . BPH (benign prostatic hyperplasia)   .  Cervical spondylosis without myelopathy    per dr cram (neuro surg.)  . Chronic back pain   . Chronic gout   . Depression   . Diverticulosis of colon   . Emphysema/COPD (HCC)   . Full dentures   . GERD (gastroesophageal reflux disease)   . Hard of hearing    does not wear hearing aides  . Hemorrhoids   . Hyperlipidemia   . Lower urinary tract symptoms (LUTS)   . Numbness    hands and related to neck  . OA (osteoarthritis)    hands  . Self-catheterizes urinary bladder   . Wears glasses     Past Surgical History:  Procedure Laterality Date    . ABDOMINAL HERNIA REPAIR  1970's  . ANTERIOR CERVICAL DECOMP/DISCECTOMY FUSION  07/03/2001   C4 -- C5/  post op Exploration and evacuation cervical hematoma  . BLEPHAROPLASTY  2008  . CARDIOVASCULAR STRESS TEST  05/27/1998   normal nuclear study w/ no ischemia/  normal LV function and wall motion , ef 64%  . CARPAL TUNNEL RELEASE Left 2003 approx.  . CERVICAL FUSION  1995   C5 -- C6  . COLONOSCOPY WITH PROPOFOL N/A 02/10/2016   Procedure: COLONOSCOPY WITH PROPOFOL;  Surgeon: Hilarie Fredrickson, MD;  Location: [facility];  Service: Endoscopy;  Laterality: N/A;  . CYSTO/  LEFT RETROGRADE PYELOGRAM/  BALLOON DILATION LEFT URETER/  URETEROSCOPY/  STENT PLACEMENT  07/28/2000  . ESOPHAGOGASTRODUODENOSCOPY    . EXPLORATION AND RE-DO CERVICAL FUSION C4--5 AND ANTERIOR CERVIAL DISKECTOMY FUSION C3--4  08/09/2007   POST-OP EXPLORATION AND EVACUATION RETROPHARYNGEAL HEMATOMA  . LAPAROSCOPIC CHOLECYSTECTOMY  1990'S  . LUMBAR LAMINECTOMY/DECOMPRESSION MICRODISCECTOMY  10/29/2011   Procedure: LUMBAR LAMINECTOMY/DECOMPRESSION MICRODISCECTOMY 1 LEVEL;  Surgeon: Mariam Dollar, MD;  Location: [facility];  Service: Neurosurgery;  Laterality: Right;  Right Lumbar Three-Four Lumbar Laminectomy/Microdiscectomy  . MAXIMUM ACCESS (MAS)POSTERIOR LUMBAR INTERBODY FUSION (PLIF) 1 LEVEL N/A 07/22/2014   Procedure: FOR MAXIMUM ACCESS (MAS) POSTERIOR LUMBAR INTERBODY FUSION (PLIF) 1 LEVEL;  Surgeon: Donalee Citrin, MD;  Location: [facility];  Service: Neurosurgery;  Laterality: N/A;  FOR MAXIMUM ACCESS (MAS) POSTERIOR LUMBAR INTERBODY FUSION (PLIF) 1 LEVEL L4-5  . NEUROPLASTY / TRANSPOSITION ULNAR NERVE AT ELBOW Left 07/23/2008  . POSTERIOR CERVICAL FUSION/FORAMINOTOMY  06/09/2011   Procedure: POSTERIOR CERVICAL FUSION/FORAMINOTOMY LEVEL 4;  Surgeon: Mariam Dollar, MD;  Location: [facility];  Service: Neurosurgery;  Laterality: N/A;  Cervical three to seven  posterior cervical fusion, Cervical four to seven Laminectomy, bone graft  from right iliac crest   . POSTERIOR LUMBAR LAMINECTOMY AND DISKECTOMY  10/15/2009   L3 -- L4  . TONSILLECTOMY AND ADENOIDECTOMY  child  . TRANSTHORACIC ECHOCARDIOGRAM  11/27/2013   grade 1 diastolic dysfunction, ef 50-55%/  trivial AR and PR/ mild MR and TR/  PASP  . TRANSURETHRAL RESECTION OF PROSTATE N/A 05/31/2016   Procedure: TRANSURETHRAL RESECTION OF THE PROSTATE (TURP);  Surgeon: Jethro Bolus, MD;  Location: [facility];  Service: Urology;  Laterality: N/A;    Family History  Problem Relation Age of Onset  . Diabetes Mother   . Parkinson's disease Father   . Heart disease Father     Social History   Socioeconomic History  . Marital status: Married    Spouse name: Not on file  . Number of children: 3  . Years of education: 7  . Highest education level: Not on file  Social Needs  . Financial resource strain: Not  on file  . Food insecurity - worry: Not on file  . Food insecurity - inability: Not on file  . Transportation needs - medical: Not on file  . Transportation needs - non-medical: Not on file  Occupational History  . Occupation: Retired  Tobacco Use  . Smoking status: Former Smoker    Packs/day: 1.50    Years: 55.00    Pack years: 82.50    Types: Cigarettes    Last attempt to quit: 05/27/2013    Years since quitting: 3.9  . Smokeless tobacco: Current User    Types: Chew  . Tobacco comment: occasional chew tobacco  Substance and Sexual Activity  . Alcohol use: No    Alcohol/week: 0.0 oz  . Drug use: No  . Sexual activity: Not on file  Other Topics Concern  . Not on file  Social History Narrative   Lives at home w/ his wife   Right-handed   Drinks 1-2 sodas per day    Review of systems: Review of Systems  Constitutional: Negative for fever and chills.  HENT: Negative.   Eyes: Negative for blurred vision.  Respiratory: as per HPI  Cardiovascular: Negative for chest pain and palpitations.  Gastrointestinal: Negative  for vomiting, diarrhea, blood per rectum. Genitourinary: Negative for dysuria, urgency, frequency and hematuria.  Musculoskeletal: Negative for myalgias, back pain and joint pain.  Skin: Negative for itching and rash.  Neurological: Negative for dizziness, tremors, focal weakness, seizures and loss of consciousness.  Endo/Heme/Allergies: Negative for environmental allergies.  Psychiatric/Behavioral: Negative for depression, suicidal ideas and hallucinations.  All other systems reviewed and are negative.  Physical Exam: Blood pressure 118/64, pulse 68, height 5\' 6"  (1.676 m), weight 220 lb (99.8 kg), SpO2 90 %. Gen:      No acute distress HEENT:  EOMI, sclera anicteric Neck:     No masses; no thyromegaly Lungs:    Scattered expiratory wheeze; normal respiratory effort CV:         Regular rate and rhythm; no murmurs Abd:      + bowel sounds; soft, non-tender; no palpable masses, no distension Ext:    No edema; adequate peripheral perfusion Skin:      Warm and dry; no rash Neuro: alert and oriented x 3 Psych: normal mood and affect  Data Reviewed: Low-dose screening CT of the chest 07/19/16- emphysema, apical scarring.  Scattered tiny pulmonary nodules. I have reviewed the images personally.  Assessment:  Emphysema, chronic bronchitis Is currently on just albuterol inhaler.  Order albuterol nebulizer and machine to be used as rescue medication Start stiolto inhaler Schedule pulmonary function test for assessment of lung function Use Mucinex over-the-counter and flutter valve for clearance of secretion  Plan/Recommendations: - Continue albuterol inhaler, and albuterol nebulizer as needed - Start stiolto inhaler - PFTs - Mucinex, flutter valve  Chilton Greathouse MD [facility] Pager [phone] 04/29/2017, 12:09 PM  CC: Sharlene Dory*

## 2017-05-04 ENCOUNTER — Telehealth: Payer: Self-pay | Admitting: Pulmonary Disease

## 2017-05-04 NOTE — Telephone Encounter (Signed)
Called patient to clarify order placed last visit for nebulizer. I need to ask him if he is on oxygen.....order placed last visit for DME stated he needed a nebulizer machine and a POC? We need to confirm that POC order is correct or entered in error? A walk was not done last visit.

## 2017-05-05 DIAGNOSIS — M48061 Spinal stenosis, lumbar region without neurogenic claudication: Secondary | ICD-10-CM | POA: Diagnosis not present

## 2017-05-05 DIAGNOSIS — M5137 Other intervertebral disc degeneration, lumbosacral region: Secondary | ICD-10-CM | POA: Diagnosis not present

## 2017-05-05 DIAGNOSIS — J42 Unspecified chronic bronchitis: Secondary | ICD-10-CM | POA: Diagnosis not present

## 2017-05-05 NOTE — Telephone Encounter (Signed)
Will route this to [place].

## 2017-05-05 NOTE — Telephone Encounter (Signed)
Attempted to call patient with no answer.   **We need to clarify if he needed oxygen last visit. The AVS was not clear.

## 2017-05-05 NOTE — Telephone Encounter (Signed)
Pt is calling back [phone]

## 2017-05-05 NOTE — Telephone Encounter (Signed)
Pt spoken too and order clarified that he does not need oxygen per last visit. Nothing further needed.

## 2017-05-05 NOTE — Telephone Encounter (Signed)
Pt said that she is getting a nebulizer from [facility] today

## 2017-05-10 ENCOUNTER — Other Ambulatory Visit: Payer: Self-pay | Admitting: Family Medicine

## 2017-05-10 DIAGNOSIS — J441 Chronic obstructive pulmonary disease with (acute) exacerbation: Secondary | ICD-10-CM

## 2017-05-12 ENCOUNTER — Telehealth: Payer: Self-pay | Admitting: Audiology

## 2017-05-12 NOTE — Telephone Encounter (Signed)
Received refill request for doxycycline (VIBRAMYCIN ) 100 MG capsule

## 2017-05-12 NOTE — Telephone Encounter (Signed)
Called pt back as I got a request to "refill" doxycyline.  He states that he may have bronchitis or it may just be his emphysema.  In any case he is being seen tomorrow by his PCP so will defer decision about abx to him

## 2017-05-12 NOTE — Telephone Encounter (Signed)
[facility] does not provide hearing aids. If that is primary reason for visit, follow-up with referent for a hearing aid evaluation and audiogram is recommended.  Deborah L. Woodward, Au.D., CCC-A Doctor of Audiology  

## 2017-05-13 ENCOUNTER — Ambulatory Visit (INDEPENDENT_AMBULATORY_CARE_PROVIDER_SITE_OTHER): Payer: Medicare Other | Admitting: Family Medicine

## 2017-05-13 ENCOUNTER — Encounter: Payer: Self-pay | Admitting: Family Medicine

## 2017-05-13 VITALS — BP 120/68 | HR 91 | Temp 98.1°F | Ht 66.0 in | Wt 220.0 lb

## 2017-05-13 DIAGNOSIS — J439 Emphysema, unspecified: Secondary | ICD-10-CM

## 2017-05-13 MED ORDER — FLUTICASONE PROPIONATE HFA 110 MCG/ACT IN AERO: 2.0000 | INHALATION_SPRAY | Freq: Two times a day (BID) | RESPIRATORY_TRACT | 2 refills | Status: DC

## 2017-05-13 NOTE — Patient Instructions (Addendum)
Follow up with Dr Isaiah SergeMannam on 2/12.  Things to look out for: fevers, shortness of breath, worsening/new symptoms.  Let us know if you need anything.

## 2017-05-13 NOTE — Progress Notes (Signed)
Chief Complaint  Patient presents with  . Cough    Evan [name] is a 77 y.o. male here for COPD.  Currently treated with nebulizer, rescue inhaler, tiotropium-olodaterol. Compliance is excellent. Breathing is OK. He has been coughing. Albuterol helps. PFTs on 2/12 with f/u w pulmonologist afterward. +LE edema (does not think it has changed) and wheezing. He has not been having any ear complaints, fevers, known weight gain, ST, rhinorrhea, nasal congestion, itchy eyes, or sick contacts.   ROS:  Lungs: No SOB Const: No fevers  Past Medical History:  Diagnosis Date  . Abnormality of gait    multiple cervical spondylosis  . Anxiety   . Bilateral leg weakness    due to back surgeries--  mostly uses wheelchair and at home very short distant w/ walker  . BPH (benign prostatic hyperplasia)   . Cervical spondylosis without myelopathy    per dr cram (neuro surg.)  . Chronic back pain   . Chronic gout   . Depression   . Diverticulosis of colon   . Emphysema/COPD (HCC)   . Full dentures   . GERD (gastroesophageal reflux disease)   . Hard of hearing    does not wear hearing aides  . Hemorrhoids   . Hyperlipidemia   . Lower urinary tract symptoms (LUTS)   . Numbness    hands and related to neck  . OA (osteoarthritis)    hands  . Self-catheterizes urinary bladder   . Wears glasses     BP 120/68 (BP Location: Right Arm, Patient Position: Sitting, Cuff Size: Large)   Pulse 91   Temp 98.1 F (36.7 C) (Oral)   Ht 5\' 6"  (1.676 m)   Wt 220 lb (99.8 kg)   SpO2 94%   BMI 35.51 kg/m  Gen: Awake, alert HEENT: MMM, nares patent, no polyps, ears neg, eyes mildly erythematous, tearing b/l Heart: RRR, +pitting LE edema Lungs: CTAB, no accessory muscle use Msk: No clubbing or cyanosis Psych: Age appropriate judgment and insight  Pulmonary emphysema, unspecified emphysema type (HCC) - Plan: fluticasone (FLOVENT HFA) 110 MCG/ACT inhaler  Orders as above. Rinse mouth out after  use of above. Considered getting echo, but as he improved with albuterol, will proceed with treating COPD.  His wt is an estimate, he is WC bound and cannot get on the scale. The 18lb wt change is not reliable.  F/u prn. The patient and wife voiced understanding and agreement to the plan.  Jilda Rocheicholas Paul CulbertsonWendling, DO 05/13/17 2:00 PM

## 2017-05-13 NOTE — Progress Notes (Signed)
Pre visit review using our clinic review tool, if applicable. No additional management support is needed unless otherwise documented below in the visit note. 

## 2017-05-18 DIAGNOSIS — R339 Retention of urine, unspecified: Secondary | ICD-10-CM | POA: Diagnosis not present

## 2017-05-30 ENCOUNTER — Other Ambulatory Visit: Payer: Self-pay | Admitting: Family Medicine

## 2017-05-31 ENCOUNTER — Encounter: Payer: Self-pay | Admitting: Pulmonary Disease

## 2017-05-31 ENCOUNTER — Ambulatory Visit (INDEPENDENT_AMBULATORY_CARE_PROVIDER_SITE_OTHER): Payer: Medicare Other | Admitting: Pulmonary Disease

## 2017-05-31 ENCOUNTER — Ambulatory Visit: Payer: Medicare Other | Admitting: Pulmonary Disease

## 2017-05-31 VITALS — BP 122/74 | HR 73 | Ht 66.0 in | Wt 220.0 lb

## 2017-05-31 DIAGNOSIS — R0602 Shortness of breath: Secondary | ICD-10-CM

## 2017-05-31 DIAGNOSIS — M48061 Spinal stenosis, lumbar region without neurogenic claudication: Secondary | ICD-10-CM | POA: Diagnosis not present

## 2017-05-31 DIAGNOSIS — J449 Chronic obstructive pulmonary disease, unspecified: Secondary | ICD-10-CM

## 2017-05-31 DIAGNOSIS — J42 Unspecified chronic bronchitis: Secondary | ICD-10-CM | POA: Diagnosis not present

## 2017-05-31 DIAGNOSIS — M5137 Other intervertebral disc degeneration, lumbosacral region: Secondary | ICD-10-CM | POA: Diagnosis not present

## 2017-05-31 DIAGNOSIS — R0609 Other forms of dyspnea: Secondary | ICD-10-CM

## 2017-05-31 LAB — PULMONARY FUNCTION TEST
DL/VA % pred: 77 %
DL/VA: 3.35 ml/min/mmHg/L
DLCO UNC % PRED: 54 %
DLCO UNC: 14.65 ml/min/mmHg
FEF 25-75 PRE: 1.56 L/s
FEF 25-75 Post: 1.53 L/sec
FEF2575-%CHANGE-POST: -2 %
FEF2575-%PRED-PRE: 86 %
FEF2575-%Pred-Post: 84 %
FEV1-%Change-Post: 0 %
FEV1-%PRED-POST: 82 %
FEV1-%Pred-Pre: 83 %
FEV1-Post: 2.08 L
FEV1-Pre: 2.09 L
FEV1FVC-%CHANGE-POST: 1 %
FEV1FVC-%Pred-Pre: 104 %
FEV6-%CHANGE-POST: -2 %
FEV6-%Pred-Post: 82 %
FEV6-%Pred-Pre: 84 %
FEV6-Post: 2.7 L
FEV6-Pre: 2.77 L
FEV6FVC-%Change-Post: 0 %
FEV6FVC-%Pred-Post: 106 %
FEV6FVC-%Pred-Pre: 106 %
FVC-%CHANGE-POST: -2 %
FVC-%Pred-Post: 77 %
FVC-%Pred-Pre: 79 %
FVC-Post: 2.73 L
FVC-Pre: 2.79 L
POST FEV1/FVC RATIO: 76 %
POST FEV6/FVC RATIO: 99 %
PRE FEV1/FVC RATIO: 75 %
Pre FEV6/FVC Ratio: 99 %
RV % pred: 103 %
RV: 2.44 L
TLC % pred: 84 %
TLC: 5.26 L

## 2017-05-31 MED ORDER — FLUTTER DEVI: 0 refills | Status: DC

## 2017-05-31 NOTE — Progress Notes (Signed)
Darrelyn HillockRichard A Patchin    [id number]    [birth year]  Primary Care Physician:Wendling, Jilda RocheNicholas Paul, DO  Referring Physician: Sharlene DoryWendling, Nicholas Paul, DO [address] [place], [place] [id number]  Chief complaint:  Follow up for emphysema, chronic bronchitis  HPI: 77 year old heavy smoker with emphysema, chronic bronchitis.  He has complained of cough for the past 3-4 months with white mucus, dyspnea, wheezing.  Denies any fevers, chills.  He was seen by primary care and given doxycycline and prednisone. He is on albuterol inhaler and never been on controller medication.  He was evaluated in the pulmonary clinic in 2015 and PFTs ordered but did not get done  Pets: 2 dogs.  No birds, farm animals Occupation: Retired Academic librarianjeweler, Corporate investment bankerconstruction worker Exposures: Exposure to asbestos while working in [employer] representative[employer] Smoking history: 150-pack-year smoking history.  Quit in 2016 Travel History: Not significant  Interim history: He was started on stiolto at last visit.   He cannot tell if this is helping.  He would like to stop some of his inhalers as the cost of the medication is too high  Outpatient Encounter Medications as of 05/31/2017  Medication Sig  . albuterol (PROVENTIL HFA;VENTOLIN HFA) 108 (90 Base) MCG/ACT inhaler Inhale 2 puffs into the lungs every 6 (six) hours as needed for wheezing or shortness of breath.  Marland Kitchen. atorvastatin (LIPITOR) 20 MG tablet TAKE 1 TABLET BY MOUTH DAILY  . carbidopa-levodopa (SINEMET IR) 25-100 MG tablet Take 1 tablet by mouth 3 (three) times daily.  . colchicine 0.6 MG tablet TAKE 1 TABLET BY MOUTH EVERY 2 HOURS UNTIL RELIEF as needed  . diazepam (VALIUM) 5 MG tablet Take 1 tablet by mouth daily as needed. Neck pain  . diclofenac sodium (VOLTAREN) 1 % GEL Apply 2 g 4 (four) times daily topically.  . febuxostat (ULORIC) 40 MG tablet Take 1 tablet (40 mg total) by mouth daily as needed.  Marland Kitchen. FLUAD 0.5 ML SUSY ADM 0.5ML IM UTD  . fluticasone  (FLOVENT HFA) 110 MCG/ACT inhaler Inhale 2 puffs into the lungs 2 (two) times daily.  Marland Kitchen. ipratropium-albuterol (DUONEB) 0.5-2.5 (3) MG/3ML SOLN Take 3 mLs by nebulization every 6 (six) hours as needed.  . Multiple Vitamins-Minerals (CENTRUM SILVER ADULT 50+ PO) Take 1 tablet by mouth daily.  . niacin 500 MG tablet Take 500 mg by mouth every morning.   . Omega-3 Fatty Acids (FISH OIL) 1000 MG CAPS Take 2,000 mg by mouth every morning.   Marland Kitchen. omeprazole (PRILOSEC) 20 MG capsule Take 1 capsule (20 mg total) by mouth every morning.  Bertram Gala. Polyethyl Glycol-Propyl Glycol (SYSTANE OP) Place 2 drops into both eyes at bedtime as needed.   . Probiotic Product (PROBIOTIC PO) Take 1 capsule by mouth every morning.   . sertraline (ZOLOFT) 25 MG tablet TAKE 1 TABLET BY MOUTH DAILY  . SHINGRIX injection   . tamsulosin (FLOMAX) 0.4 MG CAPS capsule TAKE 2 CAPSULES BY MOUTH EVERY MORNING  . Tiotropium Bromide-Olodaterol (STIOLTO RESPIMAT) 2.5-2.5 MCG/ACT AERS Inhale 2 puffs into the lungs daily.  Marland Kitchen. trimethoprim (TRIMPEX) 100 MG tablet Take 1 tablet by mouth daily.  . [DISCONTINUED] predniSONE (DELTASONE) 20 MG tablet Take 2 pills daily for 3 days, then 1 pill daily for 3 days   No facility-administered encounter medications on file as of 05/31/2017.     Allergies as of 05/31/2017 - Review Complete 05/31/2017  Allergen Reaction Noted  . Hydromorphone Itching 12/20/2014  . Oxycodone Itching and Nausea  Only 10/27/2011    Past Medical History:  Diagnosis Date  . Abnormality of gait    multiple cervical spondylosis  . Anxiety   . Bilateral leg weakness    due to back surgeries--  mostly uses wheelchair and at home very short distant w/ walker  . BPH (benign prostatic hyperplasia)   . Cervical spondylosis without myelopathy    per dr cram (neuro surg.)  . Chronic back pain   . Chronic gout   . Depression   . Diverticulosis of colon   . Emphysema/COPD (HCC)   . Full dentures   . GERD (gastroesophageal reflux  disease)   . Hard of hearing    does not wear hearing aides  . Hemorrhoids   . Hyperlipidemia   . Lower urinary tract symptoms (LUTS)   . Numbness    hands and related to neck  . OA (osteoarthritis)    hands  . Self-catheterizes urinary bladder   . Wears glasses     Past Surgical History:  Procedure Laterality Date  . ABDOMINAL HERNIA REPAIR  1970's  . ANTERIOR CERVICAL DECOMP/DISCECTOMY FUSION  07/03/2001   C4 -- C5/  post op Exploration and evacuation cervical hematoma  . BLEPHAROPLASTY  2008  . CARDIOVASCULAR STRESS TEST  05/27/1998   normal nuclear study w/ no ischemia/  normal LV function and wall motion , ef 64%  . CARPAL TUNNEL RELEASE Left 2003 approx.  . CERVICAL FUSION  1995   C5 -- C6  . COLONOSCOPY WITH PROPOFOL N/A 02/10/2016   Procedure: COLONOSCOPY WITH PROPOFOL;  Surgeon: Hilarie Fredrickson, MD;  Location: [facility];  Service: Endoscopy;  Laterality: N/A;  . CYSTO/  LEFT RETROGRADE PYELOGRAM/  BALLOON DILATION LEFT URETER/  URETEROSCOPY/  STENT PLACEMENT  07/28/2000  . ESOPHAGOGASTRODUODENOSCOPY    . EXPLORATION AND RE-DO CERVICAL FUSION C4--5 AND ANTERIOR CERVIAL DISKECTOMY FUSION C3--4  08/09/2007   POST-OP EXPLORATION AND EVACUATION RETROPHARYNGEAL HEMATOMA  . LAPAROSCOPIC CHOLECYSTECTOMY  1990'S  . LUMBAR LAMINECTOMY/DECOMPRESSION MICRODISCECTOMY  10/29/2011   Procedure: LUMBAR LAMINECTOMY/DECOMPRESSION MICRODISCECTOMY 1 LEVEL;  Surgeon: Mariam Dollar, MD;  Location: [facility];  Service: Neurosurgery;  Laterality: Right;  Right Lumbar Three-Four Lumbar Laminectomy/Microdiscectomy  . MAXIMUM ACCESS (MAS)POSTERIOR LUMBAR INTERBODY FUSION (PLIF) 1 LEVEL N/A 07/22/2014   Procedure: FOR MAXIMUM ACCESS (MAS) POSTERIOR LUMBAR INTERBODY FUSION (PLIF) 1 LEVEL;  Surgeon: Donalee Citrin, MD;  Location: [facility];  Service: Neurosurgery;  Laterality: N/A;  FOR MAXIMUM ACCESS (MAS) POSTERIOR LUMBAR INTERBODY FUSION (PLIF) 1 LEVEL L4-5  . NEUROPLASTY / TRANSPOSITION ULNAR NERVE AT  ELBOW Left 07/23/2008  . POSTERIOR CERVICAL FUSION/FORAMINOTOMY  06/09/2011   Procedure: POSTERIOR CERVICAL FUSION/FORAMINOTOMY LEVEL 4;  Surgeon: Mariam Dollar, MD;  Location: [facility];  Service: Neurosurgery;  Laterality: N/A;  Cervical three to seven  posterior cervical fusion, Cervical four to seven Laminectomy, bone graft from right iliac crest   . POSTERIOR LUMBAR LAMINECTOMY AND DISKECTOMY  10/15/2009   L3 -- L4  . TONSILLECTOMY AND ADENOIDECTOMY  child  . TRANSTHORACIC ECHOCARDIOGRAM  11/27/2013   grade 1 diastolic dysfunction, ef 50-55%/  trivial AR and PR/ mild MR and TR/  PASP  . TRANSURETHRAL RESECTION OF PROSTATE N/A 05/31/2016   Procedure: TRANSURETHRAL RESECTION OF THE PROSTATE (TURP);  Surgeon: Jethro Bolus, MD;  Location: [facility];  Service: Urology;  Laterality: N/A;    Family History  Problem Relation Age of Onset  . Diabetes Mother   . Parkinson's disease Father   .  Heart disease Father     Social History   Socioeconomic History  . Marital status: Married    Spouse name: Not on file  . Number of children: 3  . Years of education: 71  . Highest education level: Not on file  Social Needs  . Financial resource strain: Not on file  . Food insecurity - worry: Not on file  . Food insecurity - inability: Not on file  . Transportation needs - medical: Not on file  . Transportation needs - non-medical: Not on file  Occupational History  . Occupation: Retired  Tobacco Use  . Smoking status: Former Smoker    Packs/day: 1.50    Years: 55.00    Pack years: 82.50    Types: Cigarettes    Last attempt to quit: 05/27/2013    Years since quitting: 4.0  . Smokeless tobacco: Current User    Types: Chew  . Tobacco comment: occasional chew tobacco  Substance and Sexual Activity  . Alcohol use: No    Alcohol/week: 0.0 oz  . Drug use: No  . Sexual activity: Not on file  Other Topics Concern  . Not on file  Social History Narrative    Lives at home w/ his wife   Right-handed   Drinks 1-2 sodas per day    Review of systems: Review of Systems  Constitutional: Negative for fever and chills.  HENT: Negative.   Eyes: Negative for blurred vision.  Respiratory: as per HPI  Cardiovascular: Negative for chest pain and palpitations.  Gastrointestinal: Negative for vomiting, diarrhea, blood per rectum. Genitourinary: Negative for dysuria, urgency, frequency and hematuria.  Musculoskeletal: Negative for myalgias, back pain and joint pain.  Skin: Negative for itching and rash.  Neurological: Negative for dizziness, tremors, focal weakness, seizures and loss of consciousness.  Endo/Heme/Allergies: Negative for environmental allergies.  Psychiatric/Behavioral: Negative for depression, suicidal ideas and hallucinations.  All other systems reviewed and are negative.  Physical Exam: Blood pressure 122/74, pulse 73, height 5\' 6"  (1.676 m), weight 220 lb (99.8 kg), SpO2 94 %. Gen:      No acute distress HEENT:  EOMI, sclera anicteric Neck:     No masses; no thyromegaly Lungs:    Clear to auscultation bilaterally; normal respiratory effort CV:         Regular rate and rhythm; no murmurs Abd:      + bowel sounds; soft, non-tender; no palpable masses, no distension Ext:    No edema; adequate peripheral perfusion Skin:      Warm and dry; no rash Neuro: alert and oriented x 3 Psych: normal mood and affect  Data Reviewed: Low-dose screening CT of the chest 07/19/16- emphysema, left base scarring.  Scattered tiny pulmonary nodules. Low-dose screening CT of the chest 02/08/17-stable emphysema, left base scarring and stable pulmonary nodules. I have reviewed the images personally.  Assessment:  Emphysema, chronic bronchitis PFTs reviewed with him today.  Surprisingly there is no obstruction in though he has significant smoking history.  He has reduced DLCO which corrects for alveolar volume.  There is no evidence of interstitial lung  disease on recent CT of the chest except for minimal left base scarring that is stable.  He does not need to be on multiple inhalers as he cannot afford the cost.  We will stop the Flovent and stiolto.  I have asked him to continue with the duo nebs at home and albuterol rescue inhaler as needed. For his bronchitis, chronic congestion will use Mucinex over-the-counter  and start him on flutter valve therapy 3 times a day.  Sub Cm pulmonary nodules Follow with annual low dose screening CT  Plan/Recommendations: - Continue albuterol inhaler and duonebs - Stop flovent and stiolto - Mucinex, flutter valve - Annual screening CT  Chilton Greathouse MD [facility] Pager [phone] 05/31/2017, 12:10 PM  CC: Sharlene Dory*

## 2017-05-31 NOTE — Patient Instructions (Signed)
We will stop the Flovent and stiolto Continue the DuoNeb nebulizer every 6 hours as needed and albuterol inhaler as needed Use Mucinex over-the-counter for secretions We will give you a flutter valve.  Use this 3 times a day for clearance of secretion Follow-up in 3 months.

## 2017-05-31 NOTE — Progress Notes (Signed)
PFT done today. 

## 2017-06-05 DIAGNOSIS — J42 Unspecified chronic bronchitis: Secondary | ICD-10-CM | POA: Diagnosis not present

## 2017-06-14 ENCOUNTER — Ambulatory Visit: Payer: Medicare Other | Admitting: Audiology

## 2017-07-03 DIAGNOSIS — J42 Unspecified chronic bronchitis: Secondary | ICD-10-CM | POA: Diagnosis not present

## 2017-07-04 DIAGNOSIS — N401 Enlarged prostate with lower urinary tract symptoms: Secondary | ICD-10-CM | POA: Diagnosis not present

## 2017-07-04 DIAGNOSIS — N3021 Other chronic cystitis with hematuria: Secondary | ICD-10-CM | POA: Diagnosis not present

## 2017-07-04 DIAGNOSIS — R3914 Feeling of incomplete bladder emptying: Secondary | ICD-10-CM | POA: Diagnosis not present

## 2017-07-12 ENCOUNTER — Other Ambulatory Visit (INDEPENDENT_AMBULATORY_CARE_PROVIDER_SITE_OTHER): Payer: Medicare Other

## 2017-07-12 ENCOUNTER — Ambulatory Visit: Payer: Medicare Other | Admitting: Pulmonary Disease

## 2017-07-12 ENCOUNTER — Encounter: Payer: Self-pay | Admitting: Pulmonary Disease

## 2017-07-12 ENCOUNTER — Ambulatory Visit (INDEPENDENT_AMBULATORY_CARE_PROVIDER_SITE_OTHER)
Admission: RE | Admit: 2017-07-12 | Discharge: 2017-07-12 | Disposition: A | Discharge status: Home / Self Care | Payer: Medicare Other | Source: Ambulatory Visit | Attending: Pulmonary Disease | Admitting: Pulmonary Disease

## 2017-07-12 VITALS — BP 122/78 | HR 75

## 2017-07-12 DIAGNOSIS — R0609 Other forms of dyspnea: Secondary | ICD-10-CM

## 2017-07-12 DIAGNOSIS — J449 Chronic obstructive pulmonary disease, unspecified: Secondary | ICD-10-CM

## 2017-07-12 DIAGNOSIS — R0602 Shortness of breath: Secondary | ICD-10-CM | POA: Diagnosis not present

## 2017-07-12 DIAGNOSIS — R05 Cough: Secondary | ICD-10-CM | POA: Diagnosis not present

## 2017-07-12 LAB — CBC WITH DIFFERENTIAL/PLATELET
BASOS ABS: 0 10*3/uL (ref 0.0–0.1)
Basophils Relative: 0.6 % (ref 0.0–3.0)
EOS ABS: 0.5 10*3/uL (ref 0.0–0.7)
Eosinophils Relative: 6.6 % — ABNORMAL HIGH (ref 0.0–5.0)
HEMATOCRIT: 35.1 % — AB (ref 39.0–52.0)
HEMOGLOBIN: 12 g/dL — AB (ref 13.0–17.0)
LYMPHS PCT: 19.7 % (ref 12.0–46.0)
Lymphs Abs: 1.6 10*3/uL (ref 0.7–4.0)
MCHC: 34.2 g/dL (ref 30.0–36.0)
MCV: 90.7 fl (ref 78.0–100.0)
MONOS PCT: 6.9 % (ref 3.0–12.0)
Monocytes Absolute: 0.6 10*3/uL (ref 0.1–1.0)
Neutro Abs: 5.5 10*3/uL (ref 1.4–7.7)
Neutrophils Relative %: 66.2 % (ref 43.0–77.0)
Platelets: 223 10*3/uL (ref 150.0–400.0)
RBC: 3.86 Mil/uL — ABNORMAL LOW (ref 4.22–5.81)
RDW: 14.8 % (ref 11.5–15.5)
WBC: 8.3 10*3/uL (ref 4.0–10.5)

## 2017-07-12 LAB — NITRIC OXIDE: NITRIC OXIDE: 88

## 2017-07-12 MED ORDER — FLUTICASONE-UMECLIDIN-VILANT 100-62.5-25 MCG/INH IN AEPB: 1.0000 | INHALATION_SPRAY | Freq: Every day | RESPIRATORY_TRACT | 0 refills | Status: DC

## 2017-07-12 MED ORDER — PREDNISONE 10 MG PO TABS: ORAL_TABLET | ORAL | 0 refills | Status: DC

## 2017-07-12 MED ORDER — AZITHROMYCIN 250 MG PO TABS: ORAL_TABLET | ORAL | 0 refills | Status: DC

## 2017-07-12 NOTE — Progress Notes (Signed)
Evan [name]    [id number]    10/19/1940  Primary Care Physician:Wendling, Jilda Roche, DO  Referring Physician: Sharlene Dory, DO [address] [place], [place] [address]  Chief complaint:  Follow up for emphysema, chronic bronchitis Acute visit for exacerbation.  HPI: 77 year old heavy smoker with emphysema, chronic bronchitis.  He has complained of cough for the past 3-4 months with white mucus, dyspnea, wheezing.  Denies any fevers, chills.  He was seen by primary care and given doxycycline and prednisone. He is on albuterol inhaler and never been on controller medication.  He was evaluated in the pulmonary clinic in 2015 and PFTs ordered but did not get done  Pets: 2 dogs.  No birds, farm animals Occupation: Retired Academic librarian, Corporate investment banker Exposures: Exposure to asbestos while working in [employer] Smoking history: 150-pack-year smoking history.  Quit in 2016 Travel History: Not significant  Interim history: Stopped controller inhalers at last visit as the cost was too much.  He returns in 1 month with worsening dyspnea, cough, sputum production.  Denies any fevers, chills.  He is sleeping only 3 hours a day due to excessive coughing.  Outpatient Encounter Medications as of 07/12/2017  Medication Sig  . albuterol (PROVENTIL HFA;VENTOLIN HFA) 108 (90 Base) MCG/ACT inhaler Inhale 2 puffs into the lungs every 6 (six) hours as needed for wheezing or shortness of breath.  Marland Kitchen atorvastatin (LIPITOR) 20 MG tablet TAKE 1 TABLET BY MOUTH DAILY  . carbidopa-levodopa (SINEMET IR) 25-100 MG tablet Take 1 tablet by mouth 3 (three) times daily.  . colchicine 0.6 MG tablet TAKE 1 TABLET BY MOUTH EVERY 2 HOURS UNTIL RELIEF as needed  . diazepam (VALIUM) 5 MG tablet Take 1 tablet by mouth daily as needed. Neck pain  . diclofenac sodium (VOLTAREN) 1 % GEL Apply 2 g 4 (four) times daily topically.  . febuxostat (ULORIC) 40 MG tablet Take 1  tablet (40 mg total) by mouth daily as needed.  Marland Kitchen FLUAD 0.5 ML SUSY ADM 0.5ML IM UTD  . ipratropium-albuterol (DUONEB) 0.5-2.5 (3) MG/3ML SOLN Take 3 mLs by nebulization every 6 (six) hours as needed.  . Multiple Vitamins-Minerals (CENTRUM SILVER ADULT 50+ PO) Take 1 tablet by mouth daily.  . niacin 500 MG tablet Take 500 mg by mouth every morning.   . Omega-3 Fatty Acids (FISH OIL) 1000 MG CAPS Take 2,000 mg by mouth every morning.   Marland Kitchen omeprazole (PRILOSEC) 20 MG capsule Take 1 capsule (20 mg total) by mouth every morning.  Bertram Gala Glycol-Propyl Glycol (SYSTANE OP) Place 2 drops into both eyes at bedtime as needed.   . Probiotic Product (PROBIOTIC PO) Take 1 capsule by mouth every morning.   Marland Kitchen Respiratory Therapy Supplies (FLUTTER) DEVI Use as directed  . sertraline (ZOLOFT) 25 MG tablet TAKE 1 TABLET BY MOUTH DAILY  . SHINGRIX injection   . tamsulosin (FLOMAX) 0.4 MG CAPS capsule TAKE 2 CAPSULES BY MOUTH EVERY MORNING  . trimethoprim (TRIMPEX) 100 MG tablet Take 1 tablet by mouth daily.   No facility-administered encounter medications on file as of 07/12/2017.     Allergies as of 07/12/2017 - Review Complete 07/12/2017  Allergen Reaction Noted  . Hydromorphone Itching 12/20/2014  . Oxycodone Itching and Nausea Only 10/27/2011    Past Medical History:  Diagnosis Date  . Abnormality of gait    multiple cervical spondylosis  . Anxiety   . Bilateral leg weakness    due  to back surgeries--  mostly uses wheelchair and at home very short distant w/ walker  . BPH (benign prostatic hyperplasia)   . Cervical spondylosis without myelopathy    per dr cram (neuro surg.)  . Chronic back pain   . Chronic gout   . Depression   . Diverticulosis of colon   . Emphysema/COPD (HCC)   . Full dentures   . GERD (gastroesophageal reflux disease)   . Hard of hearing    does not wear hearing aides  . Hemorrhoids   . Hyperlipidemia   . Lower urinary tract symptoms (LUTS)   . Numbness    hands  and related to neck  . OA (osteoarthritis)    hands  . Self-catheterizes urinary bladder   . Wears glasses     Past Surgical History:  Procedure Laterality Date  . ABDOMINAL HERNIA REPAIR  1970's  . ANTERIOR CERVICAL DECOMP/DISCECTOMY FUSION  07/03/2001   C4 -- C5/  post op Exploration and evacuation cervical hematoma  . BLEPHAROPLASTY  2008  . CARDIOVASCULAR STRESS TEST  05/27/1998   normal nuclear study w/ no ischemia/  normal LV function and wall motion , ef 64%  . CARPAL TUNNEL RELEASE Left 2003 approx.  . CERVICAL FUSION  1995   C5 -- C6  . COLONOSCOPY WITH PROPOFOL N/A 02/10/2016   Procedure: COLONOSCOPY WITH PROPOFOL;  Surgeon: Hilarie Fredrickson, MD;  Location: [facility];  Service: Endoscopy;  Laterality: N/A;  . CYSTO/  LEFT RETROGRADE PYELOGRAM/  BALLOON DILATION LEFT URETER/  URETEROSCOPY/  STENT PLACEMENT  07/28/2000  . ESOPHAGOGASTRODUODENOSCOPY    . EXPLORATION AND RE-DO CERVICAL FUSION C4--5 AND ANTERIOR CERVIAL DISKECTOMY FUSION C3--4  08/09/2007   POST-OP EXPLORATION AND EVACUATION RETROPHARYNGEAL HEMATOMA  . LAPAROSCOPIC CHOLECYSTECTOMY  1990'S  . LUMBAR LAMINECTOMY/DECOMPRESSION MICRODISCECTOMY  10/29/2011   Procedure: LUMBAR LAMINECTOMY/DECOMPRESSION MICRODISCECTOMY 1 LEVEL;  Surgeon: Mariam Dollar, MD;  Location: [facility];  Service: Neurosurgery;  Laterality: Right;  Right Lumbar Three-Four Lumbar Laminectomy/Microdiscectomy  . MAXIMUM ACCESS (MAS)POSTERIOR LUMBAR INTERBODY FUSION (PLIF) 1 LEVEL N/A 07/22/2014   Procedure: FOR MAXIMUM ACCESS (MAS) POSTERIOR LUMBAR INTERBODY FUSION (PLIF) 1 LEVEL;  Surgeon: Donalee Citrin, MD;  Location: [facility];  Service: Neurosurgery;  Laterality: N/A;  FOR MAXIMUM ACCESS (MAS) POSTERIOR LUMBAR INTERBODY FUSION (PLIF) 1 LEVEL L4-5  . NEUROPLASTY / TRANSPOSITION ULNAR NERVE AT ELBOW Left 07/23/2008  . POSTERIOR CERVICAL FUSION/FORAMINOTOMY  06/09/2011   Procedure: POSTERIOR CERVICAL FUSION/FORAMINOTOMY LEVEL 4;  Surgeon: Mariam Dollar,  MD;  Location: [facility];  Service: Neurosurgery;  Laterality: N/A;  Cervical three to seven  posterior cervical fusion, Cervical four to seven Laminectomy, bone graft from right iliac crest   . POSTERIOR LUMBAR LAMINECTOMY AND DISKECTOMY  10/15/2009   L3 -- L4  . TONSILLECTOMY AND ADENOIDECTOMY  child  . TRANSTHORACIC ECHOCARDIOGRAM  11/27/2013   grade 1 diastolic dysfunction, ef 50-55%/  trivial AR and PR/ mild MR and TR/  PASP  . TRANSURETHRAL RESECTION OF PROSTATE N/A 05/31/2016   Procedure: TRANSURETHRAL RESECTION OF THE PROSTATE (TURP);  Surgeon: Jethro Bolus, MD;  [facility];  Service: Urology;  Laterality: N/A;    Family History  Problem Relation Age of Onset  . Diabetes Mother   . Parkinson's disease Father   . Heart disease Father     Social History   Socioeconomic History  . Marital status: Married    Spouse name: Not on file  . Number of children: 3  .  Years of education: 3412  . Highest education level: Not on file  Occupational History  . Occupation: Retired  Engineer, productionocial Needs  . Financial resource strain: Not on file  . Food insecurity:    Worry: Not on file    Inability: Not on file  . Transportation needs:    Medical: Not on file    Non-medical: Not on file  Tobacco Use  . Smoking status: Former Smoker    Packs/day: 1.50    Years: 55.00    Pack years: 82.50    Types: Cigarettes    Last attempt to quit: 05/27/2013    Years since quitting: 4.1  . Smokeless tobacco: Current User    Types: Chew  . Tobacco comment: occasional chew tobacco  Substance and Sexual Activity  . Alcohol use: No    Alcohol/week: 0.0 oz  . Drug use: No  . Sexual activity: Not on file  Lifestyle  . Physical activity:    Days per week: Not on file    Minutes per session: Not on file  . Stress: Not on file  Relationships  . Social connections:    Talks on phone: Not on file    Gets together: Not on file    Attends religious service: Not on  file    Active member of club or organization: Not on file    Attends meetings of clubs or organizations: Not on file    Relationship status: Not on file  . Intimate partner violence:    Fear of current or ex partner: Not on file    Emotionally abused: Not on file    Physically abused: Not on file    Forced sexual activity: Not on file  Other Topics Concern  . Not on file  Social History Narrative   Lives at home w/ his wife   Right-handed   Drinks 1-2 sodas per day    Review of systems: Review of Systems  Constitutional: Negative for fever and chills.  HENT: Negative.   Eyes: Negative for blurred vision.  Respiratory: as per HPI  Cardiovascular: Negative for chest pain and palpitations.  Gastrointestinal: Negative for vomiting, diarrhea, blood per rectum. Genitourinary: Negative for dysuria, urgency, frequency and hematuria.  Musculoskeletal: Negative for myalgias, back pain and joint pain.  Skin: Negative for itching and rash.  Neurological: Negative for dizziness, tremors, focal weakness, seizures and loss of consciousness.  Endo/Heme/Allergies: Negative for environmental allergies.  Psychiatric/Behavioral: Negative for depression, suicidal ideas and hallucinations.  All other systems reviewed and are negative.  Physical Exam: Blood pressure 122/78, pulse 75, SpO2 93 %. Gen:      No acute distress HEENT:  EOMI, sclera anicteric Neck:     No masses; no thyromegaly Lungs:    Expiratory wheeze, bilateral crackles. CV:         Regular rate and rhythm; no murmurs Abd:      + bowel sounds; soft, non-tender; no palpable masses, no distension Ext:    No edema; adequate peripheral perfusion Skin:      Warm and dry; no rash Neuro: alert and oriented x 3 Psych: normal mood and affect  Data Reviewed: Low-dose screening CT of the chest 07/19/16- emphysema, left base scarring.  Scattered tiny pulmonary nodules. Low-dose screening CT of the chest 02/08/17-stable emphysema, left base  scarring and stable pulmonary nodules. I have reviewed the images personally.  PFTs 05/31/17 FVC 2.73 [77%], FEV1 2.08 [82%], F/F 76, TLC 84%, DLCO 54% Mild obstruction, moderate diffusion defect  FENO 07/12/17-88  Assessment:  Emphysema, chronic bronchitis Acute visit with exacerbation.   Will treat with Z-Pak and prednisone taper Get chest x-ray to rule out pneumonia FENO is elevated today.  Check CBC with differential and blood allergy profile He has not tolerated stopping of his controller regimen.  Restart trelegy. Continue Mucinex and flutter wall.  PFTs show only minimal-mild obstruction in though he has significant smoking history.  He has reduced DLCO which corrects for alveolar volume.  There is no evidence of interstitial lung disease on recent CT of the chest except for minimal left base scarring that is stable.  Sub Cm pulmonary nodules Follow with annual low dose screening CT  Plan/Recommendations: - Continue albuterol inhaler and duonebs as needed - Start trelegy.  Prescribe Z-Pak, prednisone taper starting at 40 mg with 10 mg every 3-day taper - Chest x-ray, CBC differential, blood allergy profile. - Mucinex, flutter valve - Annual screening CT  Chilton Greathouse MD [facility] Pager [phone] 07/12/2017, 9:39 AM  CC: Sharlene Dory*

## 2017-07-12 NOTE — Patient Instructions (Signed)
We will get CBC differential and blood allergy profile and chest x-ray I believe you have bronchitis with COPD exacerbation For this we will treat with Z-Pak, prednisone taper starting at 40 mg.  Reduce dose by 10 mg every 3 days We will start you on inhaler called trelegy which you once a day Continue using your nebulizer and albuterol as needed Follow-up in 1-2 months.

## 2017-07-13 LAB — RESPIRATORY ALLERGY PROFILE REGION II ~~LOC~~
Allergen, A. alternata, m6: <0.1 kU/L
Allergen, Cottonwood, t14: <0.1 kU/L
Allergen, D pternoyssinus,d7: <0.1 kU/L
Allergen, Mouse Urine Protein, e78: <0.1 kU/L
Allergen, Oak,t7: <0.1 kU/L
Bermuda Grass: <0.1 kU/L
Box Elder IgE: <0.1 kU/L
CLASS: 0
CLASS: 0
CLASS: 0
CLASS: 0
CLASS: 0
CLASS: 0
CLASS: 0
CLASS: 0
CLASS: 0
CLASS: 0
CLASS: 0
Class: 0
Class: 0
Class: 0
Class: 0
Class: 0
Class: 0
Class: 0
Class: 0
Class: 0
Class: 0
Class: 0
Class: 0
Class: 0
D. farinae: <0.1 kU/L
Dog Dander: <0.1 kU/L
Elm IgE: <0.1 kU/L
IgE (Immunoglobulin E), Serum: 157 kU/L — ABNORMAL HIGH (ref <=114)
Johnson Grass: <0.1 kU/L
Sheep Sorrel IgE: <0.1 kU/L
Timothy Grass: <0.1 kU/L

## 2017-07-13 LAB — INTERPRETATION:

## 2017-07-14 ENCOUNTER — Telehealth: Payer: Self-pay | Admitting: Pulmonary Disease

## 2017-07-14 NOTE — Telephone Encounter (Signed)
Called and spoke with patient, advised results of the CXR. Still waiting on interpretation of the blood work. Dr. Isaiah SergeMannam please advise on this, thanks.

## 2017-07-14 NOTE — Telephone Encounter (Signed)
There is mild elevation in inflammation in blood that indicates asthma. Continue treatment as discussed in office.

## 2017-07-15 ENCOUNTER — Encounter (HOSPITAL_COMMUNITY): Payer: Self-pay | Admitting: Emergency Medicine

## 2017-07-15 ENCOUNTER — Inpatient Hospital Stay (HOSPITAL_COMMUNITY): Payer: Medicare Other

## 2017-07-15 ENCOUNTER — Inpatient Hospital Stay (HOSPITAL_COMMUNITY): Payer: Medicare Other | Admitting: Anesthesiology

## 2017-07-15 ENCOUNTER — Emergency Department (HOSPITAL_COMMUNITY): Payer: Medicare Other

## 2017-07-15 ENCOUNTER — Emergency Department (HOSPITAL_COMMUNITY)
Admission: EM | Admit: 2017-07-15 | Discharge: 2017-07-15 | Disposition: A | Discharge status: Home / Self Care | Payer: Medicare Other | Source: Home / Self Care | Attending: Emergency Medicine | Admitting: Emergency Medicine

## 2017-07-15 ENCOUNTER — Inpatient Hospital Stay (HOSPITAL_COMMUNITY)
Admission: EM | Admit: 2017-07-15 | Discharge: 2017-07-19 | DRG: 853 | Disposition: A | Discharge status: Home Health | Payer: Medicare Other | Attending: Internal Medicine | Admitting: Internal Medicine

## 2017-07-15 ENCOUNTER — Encounter (HOSPITAL_COMMUNITY)
Admission: EM | Disposition: A | Discharge status: Home Health | Payer: Self-pay | Source: Home / Self Care | Attending: Critical Care Medicine

## 2017-07-15 DIAGNOSIS — Z79899 Other long term (current) drug therapy: Secondary | ICD-10-CM | POA: Insufficient documentation

## 2017-07-15 DIAGNOSIS — N210 Calculus in bladder: Secondary | ICD-10-CM | POA: Diagnosis not present

## 2017-07-15 DIAGNOSIS — N3289 Other specified disorders of bladder: Secondary | ICD-10-CM | POA: Diagnosis not present

## 2017-07-15 DIAGNOSIS — Z820 Family history of epilepsy and other diseases of the nervous system: Secondary | ICD-10-CM

## 2017-07-15 DIAGNOSIS — E876 Hypokalemia: Secondary | ICD-10-CM | POA: Diagnosis not present

## 2017-07-15 DIAGNOSIS — J40 Bronchitis, not specified as acute or chronic: Secondary | ICD-10-CM | POA: Diagnosis not present

## 2017-07-15 DIAGNOSIS — N132 Hydronephrosis with renal and ureteral calculous obstruction: Secondary | ICD-10-CM | POA: Insufficient documentation

## 2017-07-15 DIAGNOSIS — G8929 Other chronic pain: Secondary | ICD-10-CM | POA: Diagnosis present

## 2017-07-15 DIAGNOSIS — N390 Urinary tract infection, site not specified: Secondary | ICD-10-CM | POA: Diagnosis not present

## 2017-07-15 DIAGNOSIS — R6521 Severe sepsis with septic shock: Secondary | ICD-10-CM | POA: Diagnosis present

## 2017-07-15 DIAGNOSIS — R0602 Shortness of breath: Secondary | ICD-10-CM | POA: Diagnosis not present

## 2017-07-15 DIAGNOSIS — Z981 Arthrodesis status: Secondary | ICD-10-CM

## 2017-07-15 DIAGNOSIS — N201 Calculus of ureter: Secondary | ICD-10-CM | POA: Insufficient documentation

## 2017-07-15 DIAGNOSIS — J9601 Acute respiratory failure with hypoxia: Secondary | ICD-10-CM | POA: Diagnosis not present

## 2017-07-15 DIAGNOSIS — N135 Crossing vessel and stricture of ureter without hydronephrosis: Secondary | ICD-10-CM

## 2017-07-15 DIAGNOSIS — H9190 Unspecified hearing loss, unspecified ear: Secondary | ICD-10-CM | POA: Diagnosis present

## 2017-07-15 DIAGNOSIS — E785 Hyperlipidemia, unspecified: Secondary | ICD-10-CM | POA: Diagnosis present

## 2017-07-15 DIAGNOSIS — Z6835 Body mass index (BMI) 35.0-35.9, adult: Secondary | ICD-10-CM

## 2017-07-15 DIAGNOSIS — R531 Weakness: Secondary | ICD-10-CM | POA: Diagnosis not present

## 2017-07-15 DIAGNOSIS — J449 Chronic obstructive pulmonary disease, unspecified: Secondary | ICD-10-CM

## 2017-07-15 DIAGNOSIS — J418 Mixed simple and mucopurulent chronic bronchitis: Secondary | ICD-10-CM | POA: Diagnosis not present

## 2017-07-15 DIAGNOSIS — N200 Calculus of kidney: Secondary | ICD-10-CM

## 2017-07-15 DIAGNOSIS — A419 Sepsis, unspecified organism: Secondary | ICD-10-CM | POA: Diagnosis not present

## 2017-07-15 DIAGNOSIS — F17220 Nicotine dependence, chewing tobacco, uncomplicated: Secondary | ICD-10-CM | POA: Diagnosis present

## 2017-07-15 DIAGNOSIS — K219 Gastro-esophageal reflux disease without esophagitis: Secondary | ICD-10-CM | POA: Diagnosis not present

## 2017-07-15 DIAGNOSIS — Z833 Family history of diabetes mellitus: Secondary | ICD-10-CM

## 2017-07-15 DIAGNOSIS — A411 Sepsis due to other specified staphylococcus: Principal | ICD-10-CM | POA: Diagnosis present

## 2017-07-15 DIAGNOSIS — I959 Hypotension, unspecified: Secondary | ICD-10-CM | POA: Diagnosis not present

## 2017-07-15 DIAGNOSIS — N12 Tubulo-interstitial nephritis, not specified as acute or chronic: Secondary | ICD-10-CM

## 2017-07-15 DIAGNOSIS — I5032 Chronic diastolic (congestive) heart failure: Secondary | ICD-10-CM | POA: Diagnosis present

## 2017-07-15 DIAGNOSIS — N211 Calculus in urethra: Secondary | ICD-10-CM | POA: Diagnosis not present

## 2017-07-15 DIAGNOSIS — J9811 Atelectasis: Secondary | ICD-10-CM | POA: Diagnosis not present

## 2017-07-15 DIAGNOSIS — F419 Anxiety disorder, unspecified: Secondary | ICD-10-CM | POA: Diagnosis present

## 2017-07-15 DIAGNOSIS — R100 Acute abdomen: Secondary | ICD-10-CM | POA: Diagnosis not present

## 2017-07-15 DIAGNOSIS — R6520 Severe sepsis without septic shock: Secondary | ICD-10-CM | POA: Diagnosis not present

## 2017-07-15 DIAGNOSIS — G9340 Encephalopathy, unspecified: Secondary | ICD-10-CM | POA: Diagnosis not present

## 2017-07-15 DIAGNOSIS — J439 Emphysema, unspecified: Secondary | ICD-10-CM | POA: Diagnosis not present

## 2017-07-15 DIAGNOSIS — R739 Hyperglycemia, unspecified: Secondary | ICD-10-CM | POA: Diagnosis not present

## 2017-07-15 DIAGNOSIS — M1A9XX0 Chronic gout, unspecified, without tophus (tophi): Secondary | ICD-10-CM | POA: Diagnosis present

## 2017-07-15 DIAGNOSIS — N136 Pyonephrosis: Secondary | ICD-10-CM | POA: Diagnosis not present

## 2017-07-15 DIAGNOSIS — D72829 Elevated white blood cell count, unspecified: Secondary | ICD-10-CM | POA: Diagnosis not present

## 2017-07-15 DIAGNOSIS — G20 Parkinson's disease: Secondary | ICD-10-CM | POA: Diagnosis present

## 2017-07-15 DIAGNOSIS — Z8249 Family history of ischemic heart disease and other diseases of the circulatory system: Secondary | ICD-10-CM

## 2017-07-15 DIAGNOSIS — Z7951 Long term (current) use of inhaled steroids: Secondary | ICD-10-CM

## 2017-07-15 DIAGNOSIS — R1110 Vomiting, unspecified: Secondary | ICD-10-CM | POA: Diagnosis not present

## 2017-07-15 DIAGNOSIS — K625 Hemorrhage of anus and rectum: Secondary | ICD-10-CM | POA: Diagnosis not present

## 2017-07-15 DIAGNOSIS — F329 Major depressive disorder, single episode, unspecified: Secondary | ICD-10-CM | POA: Diagnosis present

## 2017-07-15 DIAGNOSIS — R0902 Hypoxemia: Secondary | ICD-10-CM | POA: Diagnosis not present

## 2017-07-15 DIAGNOSIS — E872 Acidosis: Secondary | ICD-10-CM | POA: Diagnosis not present

## 2017-07-15 DIAGNOSIS — R000 Tachycardia, unspecified: Secondary | ICD-10-CM | POA: Diagnosis not present

## 2017-07-15 DIAGNOSIS — R1031 Right lower quadrant pain: Secondary | ICD-10-CM | POA: Diagnosis not present

## 2017-07-15 HISTORY — PX: CYSTOSCOPY W/ URETERAL STENT PLACEMENT: SHX1429

## 2017-07-15 LAB — CBC WITH DIFFERENTIAL/PLATELET
BASOS ABS: 0 10*3/uL (ref 0.0–0.1)
BASOS PCT: 0 %
Basophils Absolute: 0 10*3/uL (ref 0.0–0.1)
Basophils Relative: 0 %
Eosinophils Absolute: 0 10*3/uL (ref 0.0–0.7)
Eosinophils Absolute: 0 10*3/uL (ref 0.0–0.7)
Eosinophils Relative: 0 %
Eosinophils Relative: 0 %
HCT: 41.2 % (ref 39.0–52.0)
HEMATOCRIT: 38.1 % — AB (ref 39.0–52.0)
HEMOGLOBIN: 12.4 g/dL — AB (ref 13.0–17.0)
Hemoglobin: 13.8 g/dL (ref 13.0–17.0)
LYMPHS PCT: 3 %
Lymphocytes Relative: 9 %
Lymphs Abs: 1.4 10*3/uL (ref 0.7–4.0)
Lymphs Abs: 0.4 10*3/uL — ABNORMAL LOW (ref 0.7–4.0)
MCH: 30.9 pg (ref 26.0–34.0)
MCH: 29.9 pg (ref 26.0–34.0)
MCHC: 33.5 g/dL (ref 30.0–36.0)
MCHC: 32.5 g/dL (ref 30.0–36.0)
MCV: 92.2 fL (ref 78.0–100.0)
MCV: 91.8 fL (ref 78.0–100.0)
MONO ABS: 0.5 10*3/uL (ref 0.1–1.0)
MONOS PCT: 3 %
Monocytes Absolute: 1.4 10*3/uL — ABNORMAL HIGH (ref 0.1–1.0)
Monocytes Relative: 9 %
NEUTROS ABS: 15.2 10*3/uL — AB (ref 1.7–7.7)
NEUTROS PCT: 94 %
Neutro Abs: 13.2 10*3/uL — ABNORMAL HIGH (ref 1.7–7.7)
Neutrophils Relative %: 82 %
Platelets: 256 10*3/uL (ref 150–400)
Platelets: 219 10*3/uL (ref 150–400)
RBC: 4.47 MIL/uL (ref 4.22–5.81)
RBC: 4.15 MIL/uL — ABNORMAL LOW (ref 4.22–5.81)
RDW: 14.1 % (ref 11.5–15.5)
RDW: 14.3 % (ref 11.5–15.5)
WBC: 16 10*3/uL — ABNORMAL HIGH (ref 4.0–10.5)
WBC: 16.2 10*3/uL — ABNORMAL HIGH (ref 4.0–10.5)

## 2017-07-15 LAB — URINALYSIS, ROUTINE W REFLEX MICROSCOPIC
Bilirubin Urine: NEGATIVE
Glucose, UA: NEGATIVE mg/dL
Ketones, ur: NEGATIVE mg/dL
Nitrite: NEGATIVE
Protein, ur: NEGATIVE mg/dL
Specific Gravity, Urine: 1.017 (ref 1.005–1.030)
pH: 6 (ref 5.0–8.0)

## 2017-07-15 LAB — COMPREHENSIVE METABOLIC PANEL
ALBUMIN: 3 g/dL — AB (ref 3.5–5.0)
ALT: 23 U/L (ref 17–63)
ALT: 36 U/L (ref 17–63)
AST: 30 U/L (ref 15–41)
AST: 56 U/L — AB (ref 15–41)
Albumin: 3.8 g/dL (ref 3.5–5.0)
Alkaline Phosphatase: 108 U/L (ref 38–126)
Alkaline Phosphatase: 112 U/L (ref 38–126)
Anion gap: 13 (ref 5–15)
Anion gap: 13 (ref 5–15)
BUN: 27 mg/dL — ABNORMAL HIGH (ref 6–20)
BUN: 27 mg/dL — AB (ref 6–20)
CHLORIDE: 106 mmol/L (ref 101–111)
CO2: 22 mmol/L (ref 22–32)
CO2: 19 mmol/L — AB (ref 22–32)
CREATININE: 1.33 mg/dL — AB (ref 0.61–1.24)
Calcium: 9.2 mg/dL (ref 8.9–10.3)
Calcium: 8.6 mg/dL — ABNORMAL LOW (ref 8.9–10.3)
Chloride: 104 mmol/L (ref 101–111)
Creatinine, Ser: 0.91 mg/dL (ref 0.61–1.24)
GFR calc Af Amer: >60 mL/min (ref >60)
GFR calc Af Amer: 58 mL/min — ABNORMAL LOW (ref >60)
GFR calc non Af Amer: >60 mL/min (ref >60)
GFR calc non Af Amer: 50 mL/min — ABNORMAL LOW (ref >60)
Glucose, Bld: 161 mg/dL — ABNORMAL HIGH (ref 65–99)
Glucose, Bld: 172 mg/dL — ABNORMAL HIGH (ref 65–99)
POTASSIUM: 4.1 mmol/L (ref 3.5–5.1)
Potassium: 4.2 mmol/L (ref 3.5–5.1)
Sodium: 139 mmol/L (ref 135–145)
Sodium: 138 mmol/L (ref 135–145)
Total Bilirubin: 0.8 mg/dL (ref 0.3–1.2)
Total Bilirubin: 0.7 mg/dL (ref 0.3–1.2)
Total Protein: 7.6 g/dL (ref 6.5–8.1)
Total Protein: 6.2 g/dL — ABNORMAL LOW (ref 6.5–8.1)

## 2017-07-15 LAB — POCT I-STAT 7, (LYTES, BLD GAS, ICA,H+H)
ACID-BASE DEFICIT: 3 mmol/L — AB (ref 0.0–2.0)
BICARBONATE: 21.6 mmol/L (ref 20.0–28.0)
CALCIUM ION: 1.16 mmol/L (ref 1.15–1.40)
HCT: 30 % — ABNORMAL LOW (ref 39.0–52.0)
Hemoglobin: 10.2 g/dL — ABNORMAL LOW (ref 13.0–17.0)
O2 Saturation: 98 %
PH ART: 7.37 (ref 7.350–7.450)
PO2 ART: 112 mmHg — AB (ref 83.0–108.0)
Patient temperature: 38
Potassium: 3.9 mmol/L (ref 3.5–5.1)
SODIUM: 140 mmol/L (ref 135–145)
TCO2: 23 mmol/L (ref 22–32)
pCO2 arterial: 37.8 mmHg (ref 32.0–48.0)

## 2017-07-15 LAB — PROTIME-INR
INR: 1.11
Prothrombin Time: 14.2 seconds (ref 11.4–15.2)

## 2017-07-15 LAB — I-STAT TROPONIN, ED: Troponin i, poc: 0.02 ng/mL (ref 0.00–0.08)

## 2017-07-15 LAB — LACTIC ACID, PLASMA: LACTIC ACID, VENOUS: 4.8 mmol/L — AB (ref 0.5–1.9)

## 2017-07-15 LAB — I-STAT CG4 LACTIC ACID, ED: LACTIC ACID, VENOUS: 4.39 mmol/L — AB (ref 0.5–1.9)

## 2017-07-15 LAB — PROCALCITONIN: PROCALCITONIN: 5.72 ng/mL

## 2017-07-15 LAB — GLUCOSE, CAPILLARY: GLUCOSE-CAPILLARY: 152 mg/dL — AB (ref 65–99)

## 2017-07-15 LAB — LIPASE, BLOOD: Lipase: 16 U/L (ref 11–51)

## 2017-07-15 LAB — BRAIN NATRIURETIC PEPTIDE: B NATRIURETIC PEPTIDE 5: 162.3 pg/mL — AB (ref 0.0–100.0)

## 2017-07-15 SURGERY — CYSTOSCOPY, WITH RETROGRADE PYELOGRAM AND URETERAL STENT INSERTION
Anesthesia: General | Laterality: Right

## 2017-07-15 MED ORDER — HEPARIN SODIUM (PORCINE) 5000 UNIT/ML IJ SOLN
5000.0000 [IU] | Freq: Three times a day (TID) | INTRAMUSCULAR | Status: DC
  Administered 2017-07-16 – 2017-07-19 (×10): 5000 [IU] via SUBCUTANEOUS
  Filled 2017-07-15 (×10): qty 1

## 2017-07-15 MED ORDER — PANTOPRAZOLE SODIUM 40 MG PO TBEC
40.0000 mg | DELAYED_RELEASE_TABLET | Freq: Every day | ORAL | Status: DC
Start: 2017-07-16 — End: 2017-07-20
  Administered 2017-07-16 – 2017-07-19 (×4): 40 mg via ORAL
  Filled 2017-07-15 (×4): qty 1

## 2017-07-15 MED ORDER — INDIGOTINDISULFONATE SODIUM 8 MG/ML IJ SOLN
INTRAMUSCULAR | Status: AC
  Filled 2017-07-15: qty 5

## 2017-07-15 MED ORDER — PREDNISONE 5 MG PO TABS
30.0000 mg | ORAL_TABLET | Freq: Every day | ORAL | Status: DC
  Administered 2017-07-16 – 2017-07-17 (×2): 30 mg via ORAL
  Filled 2017-07-15 (×2): qty 2

## 2017-07-15 MED ORDER — TAMSULOSIN HCL 0.4 MG PO CAPS
0.8000 mg | ORAL_CAPSULE | Freq: Every day | ORAL | Status: DC
  Administered 2017-07-16 – 2017-07-19 (×4): 0.8 mg via ORAL
  Filled 2017-07-15 (×5): qty 2

## 2017-07-15 MED ORDER — ONDANSETRON 4 MG PO TBDP: 4.0000 mg | ORAL_TABLET | Freq: Three times a day (TID) | ORAL | 0 refills | Status: DC | PRN

## 2017-07-15 MED ORDER — VANCOMYCIN HCL IN DEXTROSE 750-5 MG/150ML-% IV SOLN
750.0000 mg | Freq: Two times a day (BID) | INTRAVENOUS | Status: DC
  Administered 2017-07-16: 750 mg via INTRAVENOUS
  Filled 2017-07-15 (×2): qty 150

## 2017-07-15 MED ORDER — VANCOMYCIN HCL IN DEXTROSE 1-5 GM/200ML-% IV SOLN
1000.0000 mg | Freq: Once | INTRAVENOUS | Status: AC
  Administered 2017-07-15: 1000 mg via INTRAVENOUS
  Filled 2017-07-15: qty 200

## 2017-07-15 MED ORDER — SODIUM CHLORIDE 0.9 % IV SOLN
INTRAVENOUS | Status: DC | PRN
  Administered 2017-07-15: 3 mL

## 2017-07-15 MED ORDER — HYDROMORPHONE HCL 1 MG/ML IJ SOLN
1.0000 mg | Freq: Once | INTRAMUSCULAR | Status: AC
  Administered 2017-07-15: 1 mg via INTRAVENOUS
  Filled 2017-07-15: qty 1

## 2017-07-15 MED ORDER — SODIUM CHLORIDE 0.9 % IV SOLN
INTRAVENOUS | Status: DC
  Administered 2017-07-15 – 2017-07-16 (×2): via INTRAVENOUS

## 2017-07-15 MED ORDER — ALBUMIN HUMAN 5 % IV SOLN
INTRAVENOUS | Status: DC | PRN
  Administered 2017-07-15: 23:00:00 via INTRAVENOUS

## 2017-07-15 MED ORDER — HYDROMORPHONE HCL 1 MG/ML IJ SOLN
0.5000 mg | Freq: Once | INTRAMUSCULAR | Status: AC
  Administered 2017-07-15: 0.5 mg via INTRAVENOUS
  Filled 2017-07-15: qty 1

## 2017-07-15 MED ORDER — FENTANYL CITRATE (PF) 100 MCG/2ML IJ SOLN: 25.0000 ug | INTRAMUSCULAR | Status: DC | PRN

## 2017-07-15 MED ORDER — IPRATROPIUM-ALBUTEROL 0.5-2.5 (3) MG/3ML IN SOLN
3.0000 mL | Freq: Four times a day (QID) | RESPIRATORY_TRACT | Status: DC | PRN
  Administered 2017-07-17 – 2017-07-18 (×3): 3 mL via RESPIRATORY_TRACT
  Filled 2017-07-15 (×3): qty 3

## 2017-07-15 MED ORDER — INSULIN ASPART 100 UNIT/ML ~~LOC~~ SOLN
2.0000 [IU] | SUBCUTANEOUS | Status: DC
  Administered 2017-07-16: 6 [IU] via SUBCUTANEOUS
  Administered 2017-07-16: 2 [IU] via SUBCUTANEOUS
  Administered 2017-07-16 – 2017-07-17 (×3): 4 [IU] via SUBCUTANEOUS
  Administered 2017-07-17: 2 [IU] via SUBCUTANEOUS
  Administered 2017-07-17: 4 [IU] via SUBCUTANEOUS

## 2017-07-15 MED ORDER — ONDANSETRON HCL 4 MG/2ML IJ SOLN: 4.0000 mg | Freq: Four times a day (QID) | INTRAMUSCULAR | Status: DC | PRN

## 2017-07-15 MED ORDER — PIPERACILLIN-TAZOBACTAM 3.375 G IVPB: 3.3750 g | Freq: Three times a day (TID) | INTRAVENOUS | Status: DC

## 2017-07-15 MED ORDER — SODIUM CHLORIDE 0.9 % IV SOLN
1.0000 g | Freq: Once | INTRAVENOUS | Status: AC
  Administered 2017-07-15: 1 g via INTRAVENOUS
  Filled 2017-07-15: qty 10

## 2017-07-15 MED ORDER — SODIUM CHLORIDE 0.9 % IV SOLN
2.0000 g | Freq: Two times a day (BID) | INTRAVENOUS | Status: DC
  Administered 2017-07-15 – 2017-07-17 (×5): 2 g via INTRAVENOUS
  Filled 2017-07-15 (×6): qty 2

## 2017-07-15 MED ORDER — ACETAMINOPHEN 325 MG PO TABS: 650.0000 mg | ORAL_TABLET | Freq: Four times a day (QID) | ORAL | Status: DC | PRN

## 2017-07-15 MED ORDER — CEPHALEXIN 500 MG PO CAPS: 500.0000 mg | ORAL_CAPSULE | Freq: Four times a day (QID) | ORAL | 0 refills | Status: DC

## 2017-07-15 MED ORDER — ROCURONIUM BROMIDE 50 MG/5ML IV SOSY
PREFILLED_SYRINGE | INTRAVENOUS | Status: DC | PRN
  Administered 2017-07-15 (×2): 15 mg via INTRAVENOUS

## 2017-07-15 MED ORDER — PHENYLEPHRINE HCL 10 MG/ML IJ SOLN
0.0000 ug/min | INTRAMUSCULAR | Status: DC
Start: 2017-07-15 — End: 2017-07-16
  Administered 2017-07-15: 20 ug/min via INTRAVENOUS
  Administered 2017-07-16: 60 ug/min via INTRAVENOUS
  Administered 2017-07-16: 20 ug/min via INTRAVENOUS
  Filled 2017-07-15 (×4): qty 1

## 2017-07-15 MED ORDER — FENTANYL CITRATE (PF) 100 MCG/2ML IJ SOLN
INTRAMUSCULAR | Status: AC
  Filled 2017-07-15: qty 2

## 2017-07-15 MED ORDER — ALBUTEROL SULFATE HFA 108 (90 BASE) MCG/ACT IN AERS
INHALATION_SPRAY | RESPIRATORY_TRACT | Status: AC
  Filled 2017-07-15: qty 6.7

## 2017-07-15 MED ORDER — SUCCINYLCHOLINE CHLORIDE 200 MG/10ML IV SOSY
PREFILLED_SYRINGE | INTRAVENOUS | Status: DC | PRN
  Administered 2017-07-15: 100 mg via INTRAVENOUS

## 2017-07-15 MED ORDER — SODIUM CHLORIDE 0.9 % IJ SOLN
INTRAMUSCULAR | Status: AC
  Filled 2017-07-15: qty 50

## 2017-07-15 MED ORDER — ONDANSETRON HCL 4 MG/2ML IJ SOLN
INTRAMUSCULAR | Status: AC
  Filled 2017-07-15: qty 2

## 2017-07-15 MED ORDER — PHENTOLAMINE MESYLATE 5 MG IJ SOLR
10.0000 mg | Freq: Once | INTRAMUSCULAR | Status: AC
  Administered 2017-07-15: 10 mg via SUBCUTANEOUS
  Filled 2017-07-15: qty 10

## 2017-07-15 MED ORDER — DEXTROSE 5 % IV SOLN
250.0000 mg | Freq: Every day | INTRAVENOUS | Status: DC
  Filled 2017-07-15: qty 250

## 2017-07-15 MED ORDER — FENTANYL CITRATE (PF) 100 MCG/2ML IJ SOLN
12.5000 ug | INTRAMUSCULAR | Status: DC | PRN
  Administered 2017-07-16: 25 ug via INTRAVENOUS
  Filled 2017-07-15: qty 2

## 2017-07-15 MED ORDER — ETOMIDATE 2 MG/ML IV SOLN
INTRAVENOUS | Status: DC | PRN
  Administered 2017-07-15: 14 mg via INTRAVENOUS

## 2017-07-15 MED ORDER — PIPERACILLIN-TAZOBACTAM 3.375 G IVPB 30 MIN
3.3750 g | Freq: Once | INTRAVENOUS | Status: AC
  Administered 2017-07-15: 3.375 g via INTRAVENOUS
  Filled 2017-07-15: qty 50

## 2017-07-15 MED ORDER — IOPAMIDOL (ISOVUE-300) INJECTION 61%
100.0000 mL | Freq: Once | INTRAVENOUS | Status: AC | PRN
  Administered 2017-07-15: 100 mL via INTRAVENOUS

## 2017-07-15 MED ORDER — FENTANYL CITRATE (PF) 100 MCG/2ML IJ SOLN
INTRAMUSCULAR | Status: DC | PRN
  Administered 2017-07-15: 25 ug via INTRAVENOUS

## 2017-07-15 MED ORDER — ALBUTEROL SULFATE HFA 108 (90 BASE) MCG/ACT IN AERS
INHALATION_SPRAY | RESPIRATORY_TRACT | Status: DC | PRN
  Administered 2017-07-15: 2 via RESPIRATORY_TRACT

## 2017-07-15 MED ORDER — PROPOFOL 10 MG/ML IV BOLUS
INTRAVENOUS | Status: AC
  Filled 2017-07-15: qty 20

## 2017-07-15 MED ORDER — LACTATED RINGERS IV BOLUS
1000.0000 mL | Freq: Once | INTRAVENOUS | Status: AC
  Administered 2017-07-15: 1000 mL via INTRAVENOUS

## 2017-07-15 MED ORDER — SODIUM CHLORIDE 0.9 % IV SOLN: 250.0000 mL | INTRAVENOUS | Status: DC | PRN

## 2017-07-15 MED ORDER — SODIUM CHLORIDE 0.9 % IV SOLN
500.0000 mg | Freq: Every day | INTRAVENOUS | Status: DC
  Administered 2017-07-16 – 2017-07-17 (×3): 500 mg via INTRAVENOUS
  Filled 2017-07-15 (×3): qty 500

## 2017-07-15 MED ORDER — HEPARIN SODIUM (PORCINE) 5000 UNIT/ML IJ SOLN: 5000.0000 [IU] | Freq: Three times a day (TID) | INTRAMUSCULAR | Status: DC

## 2017-07-15 MED ORDER — ONDANSETRON HCL 4 MG/2ML IJ SOLN
4.0000 mg | Freq: Once | INTRAMUSCULAR | Status: AC
  Administered 2017-07-15: 4 mg via INTRAVENOUS
  Filled 2017-07-15: qty 2

## 2017-07-15 MED ORDER — SODIUM CHLORIDE 0.9 % IV SOLN: 2.0000 g | Freq: Once | INTRAVENOUS | Status: DC

## 2017-07-15 MED ORDER — SODIUM CHLORIDE 0.9 % IR SOLN
Status: DC | PRN
  Administered 2017-07-15: 6000 mL

## 2017-07-15 MED ORDER — MORPHINE SULFATE 15 MG PO TABS: 15.0000 mg | ORAL_TABLET | Freq: Four times a day (QID) | ORAL | 0 refills | Status: DC | PRN

## 2017-07-15 MED ORDER — IOPAMIDOL (ISOVUE-300) INJECTION 61%
INTRAVENOUS | Status: AC
  Filled 2017-07-15: qty 100

## 2017-07-15 MED ORDER — PHENYLEPHRINE HCL 10 MG/ML IJ SOLN
INTRAVENOUS | Status: DC | PRN
  Administered 2017-07-15: 40 ug/min via INTRAVENOUS

## 2017-07-15 MED ORDER — SUGAMMADEX SODIUM 200 MG/2ML IV SOLN
INTRAVENOUS | Status: DC | PRN
  Administered 2017-07-15: 199.6 mg via INTRAVENOUS

## 2017-07-15 MED ORDER — ONDANSETRON HCL 4 MG/2ML IJ SOLN
INTRAMUSCULAR | Status: DC | PRN
  Administered 2017-07-15: 4 mg via INTRAVENOUS

## 2017-07-15 MED ORDER — LIDOCAINE 2% (20 MG/ML) 5 ML SYRINGE
INTRAMUSCULAR | Status: AC
  Filled 2017-07-15: qty 5

## 2017-07-15 MED ORDER — LACTATED RINGERS IV BOLUS
2000.0000 mL | Freq: Once | INTRAVENOUS | Status: AC
  Administered 2017-07-15: 2000 mL via INTRAVENOUS

## 2017-07-15 SURGICAL SUPPLY — 16 items
BAG URO CATCHER STRL LF (MISCELLANEOUS) ×2 IMPLANT
CATH FOLEY 2WAY SLVR  5CC 16FR (CATHETERS) ×1
CATH FOLEY 2WAY SLVR 5CC 16FR (CATHETERS) ×1 IMPLANT
CATH INTERMIT  6FR 70CM (CATHETERS) ×2 IMPLANT
CLOTH BEACON ORANGE TIMEOUT ST (SAFETY) ×2 IMPLANT
COVER FOOTSWITCH UNIV (MISCELLANEOUS) ×1 IMPLANT
COVER SURGICAL LIGHT HANDLE (MISCELLANEOUS) ×2 IMPLANT
GLOVE BIOGEL M 8.0 STRL (GLOVE) ×2 IMPLANT
GOWN STRL REUS W/ TWL XL LVL3 (GOWN DISPOSABLE) ×1 IMPLANT
GOWN STRL REUS W/TWL XL LVL3 (GOWN DISPOSABLE) ×2
GUIDEWIRE STR DUAL SENSOR (WIRE) ×2 IMPLANT
MANIFOLD NEPTUNE II (INSTRUMENTS) ×2 IMPLANT
PACK CYSTO (CUSTOM PROCEDURE TRAY) ×2 IMPLANT
STENT URET 6FRX24 CONTOUR (STENTS) ×1 IMPLANT
SYRINGE 12CC ECCENTRIC TIP (MISCELLANEOUS) ×1 IMPLANT
TUBING CONNECTING 10 (TUBING) ×2 IMPLANT

## 2017-07-15 NOTE — ED Provider Notes (Addendum)
[facility]-EMERGENCY DEPT Provider Note   CSN: [id number] Arrival date & time: 07/15/17  4098     History   Chief Complaint Chief Complaint  Patient presents with  . Flank Pain    HPI Evan [name] is a 77 y.o. male with history of BPH, depression, emphysema/COPD, GERD, HLD presents today for evaluation of acute onset, constant right-sided flank pain which began yesterday evening but suddenly worsened at around 3 AM this morning.  Patient states that he was unable to sleep due to his discomfort and also ongoing problems with his emphysema and bronchitis with whom he is currently following up with pulmonology.  Pain radiates to the right upper quadrant of the abdomen.  He notes 3 episodes of nonbloody nonbilious emesis.  Denies diarrhea, constipation, melena, or hematochezia.  He self caths twice a day, denies hematuria, dysuria but endorses urgency.  Pain is sharp and stabbing in nature, he is unsure if it feels similar to prior kidney stones he has had in the past. Pain worsens laying flat on his back, improves sitting upright with his legs dangling. He denies any worsening shortness of breath or chest pain.  The history is provided by the patient.    Past Medical History:  Diagnosis Date  . Abnormality of gait    multiple cervical spondylosis  . Anxiety   . Bilateral leg weakness    due to back surgeries--  mostly uses wheelchair and at home very short distant w/ walker  . BPH (benign prostatic hyperplasia)   . Cervical spondylosis without myelopathy    per dr cram (neuro surg.)  . Chronic back pain   . Chronic gout   . Depression   . Diverticulosis of colon   . Emphysema/COPD (HCC)   . Full dentures   . GERD (gastroesophageal reflux disease)   . Hard of hearing    does not wear hearing aides  . Hemorrhoids   . Hyperlipidemia   . Lower urinary tract symptoms (LUTS)   . Numbness    hands and related to neck  . OA (osteoarthritis)    hands    . Self-catheterizes urinary bladder   . Wears glasses     Patient Active Problem List   Diagnosis Date Noted  . Chronic bronchitis with emphysema (HCC) 05/31/2017  . Bilateral hearing loss 04/25/2017  . Parkinsonism (HCC) 09/03/2016  . Right leg weakness 09/03/2016  . Diverticulosis of colon with hemorrhage   . UTI (urinary tract infection) 12/09/2015  . Rectal bleed 12/07/2015  . Chronic diastolic CHF (congestive heart failure) (HCC) 12/07/2015  . Rectal bleeding 12/07/2015  . Abnormality of gait 10/29/2015  . 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 Weakness of both legs 07/27/2015  . Shortness of breath on exertion 07/24/2015  . Tremor 07/24/2015  . Acute stress reaction 07/24/2015  . Screening, ischemic heart disease 01/29/2015  . Hernia of abdominal cavity 12/20/2014  . Gastroesophageal reflux disease without esophagitis 09/03/2014  . Depression with anxiety 09/03/2014  . BPH (benign prostatic hyperplasia) 09/03/2014  . Gout, chronic 09/03/2014  . Spondylolisthesis at L4-L5 level 07/22/2014  . Diastolic dysfunction with heart failure (HCC) 01/01/2014  . Peripheral neuropathy 01/01/2014  . DOE (dyspnea on exertion) 11/15/2013  . Leg edema 11/15/2013  . Snoring 11/15/2013  . Pulmonary emphysema (HCC) 05/10/2013    Past Surgical History:  Procedure Laterality Date  . ABDOMINAL HERNIA REPAIR  1970's  . ANTERIOR CERVICAL DECOMP/DISCECTOMY FUSION  07/03/2001   C4 -- C5/  post op Exploration and evacuation cervical hematoma  . BLEPHAROPLASTY  2008  . CARDIOVASCULAR STRESS TEST  05/27/1998   normal nuclear study w/ no ischemia/  normal LV function and wall motion , ef 64%  . CARPAL TUNNEL RELEASE Left 2003 approx.  . CERVICAL FUSION  1995   C5 -- C6  . COLONOSCOPY WITH PROPOFOL N/A 02/10/2016   Procedure: COLONOSCOPY WITH PROPOFOL;  Surgeon: Hilarie Fredrickson, MD;  Location: [facility];  Service: Endoscopy;  Laterality: N/A;  . CYSTO/  LEFT RETROGRADE  PYELOGRAM/  BALLOON DILATION LEFT URETER/  URETEROSCOPY/  STENT PLACEMENT  07/28/2000  . ESOPHAGOGASTRODUODENOSCOPY    . EXPLORATION AND RE-DO CERVICAL FUSION C4--5 AND ANTERIOR CERVIAL DISKECTOMY FUSION C3--4  08/09/2007   POST-OP EXPLORATION AND EVACUATION RETROPHARYNGEAL HEMATOMA  . LAPAROSCOPIC CHOLECYSTECTOMY  1990'S  . LUMBAR LAMINECTOMY/DECOMPRESSION MICRODISCECTOMY  10/29/2011   Procedure: LUMBAR LAMINECTOMY/DECOMPRESSION MICRODISCECTOMY 1 LEVEL;  Surgeon: Mariam Dollar, MD;  Location: [facility];  Service: Neurosurgery;  Laterality: Right;  Right Lumbar Three-Four Lumbar Laminectomy/Microdiscectomy  . MAXIMUM ACCESS (MAS)POSTERIOR LUMBAR INTERBODY FUSION (PLIF) 1 LEVEL N/A 07/22/2014   Procedure: FOR MAXIMUM ACCESS (MAS) POSTERIOR LUMBAR INTERBODY FUSION (PLIF) 1 LEVEL;  Surgeon: Donalee Citrin, MD;  Location: [facility];  Service: Neurosurgery;  Laterality: N/A;  FOR MAXIMUM ACCESS (MAS) POSTERIOR LUMBAR INTERBODY FUSION (PLIF) 1 LEVEL L4-5  . NEUROPLASTY / TRANSPOSITION ULNAR NERVE AT ELBOW Left 07/23/2008  . POSTERIOR CERVICAL FUSION/FORAMINOTOMY  06/09/2011   Procedure: POSTERIOR CERVICAL FUSION/FORAMINOTOMY LEVEL 4;  Surgeon: Mariam Dollar, MD;  Location: [facility];  Service: Neurosurgery;  Laterality: N/A;  Cervical three to seven  posterior cervical fusion, Cervical four to seven Laminectomy, bone graft from right iliac crest   . POSTERIOR LUMBAR LAMINECTOMY AND DISKECTOMY  10/15/2009   L3 -- L4  . TONSILLECTOMY AND ADENOIDECTOMY  child  . TRANSTHORACIC ECHOCARDIOGRAM  11/27/2013   grade 1 diastolic dysfunction, ef 50-55%/  trivial AR and PR/ mild MR and TR/  PASP  . TRANSURETHRAL RESECTION OF PROSTATE N/A 05/31/2016   Procedure: TRANSURETHRAL RESECTION OF THE PROSTATE (TURP);  Surgeon: Jethro Bolus, MD;  Location: [facility];  Service: Urology;  Laterality: N/A;        Home Medications    Prior to Admission medications   Medication Sig Start Date End  Date Taking? Authorizing Provider  albuterol (PROVENTIL HFA;VENTOLIN HFA) 108 (90 Base) MCG/ACT inhaler Inhale 2 puffs into the lungs every 6 (six) hours as needed for wheezing or shortness of breath. 04/28/17  Yes Copland, Gwenlyn Found, MD  atorvastatin (LIPITOR) 20 MG tablet TAKE 1 TABLET BY MOUTH DAILY Patient taking differently: TAKE 20MG  BY MOUTH DAILY 05/31/17  Yes Wendling, Jilda Roche, DO  azithromycin (ZITHROMAX) 250 MG tablet Take 2 today, then 1 daily until gone. Patient taking differently: Take 250-500 mg by mouth daily. Take 2 today, then 1 daily until gone. 07/12/17  Yes Mannam, Praveen, MD  carbidopa-levodopa (SINEMET IR) 25-100 MG tablet Take 1 tablet by mouth 3 (three) times daily. 09/03/16  Yes Patel, Donika K, DO  colchicine 0.6 MG tablet TAKE 1 TABLET BY MOUTH EVERY 2 HOURS UNTIL RELIEF as needed Patient taking differently: Take 0.6 mg by mouth every 2 (two) hours as needed (For gout.).  07/14/16  Yes Wendling, Jilda Roche, DO  diazepam (VALIUM) 5 MG tablet Take 2.5-5 mg by mouth daily as needed (For neck pain.).  05/10/16  Yes [provider]  diclofenac sodium (VOLTAREN) 1 %  GEL Apply 2 g 4 (four) times daily topically. Patient taking differently: Apply 2 g topically 4 (four) times daily as needed (For pain.).  03/07/17  Yes Wendling, Jilda RocheNicholas Paul, DO  febuxostat (ULORIC) 40 MG tablet Take 1 tablet (40 mg total) by mouth daily as needed. 12/23/16  Yes Sharlene DoryWendling, Nicholas Paul, DO  Fluticasone-Umeclidin-Vilant (TRELEGY ELLIPTA) 100-62.5-25 MCG/INH AEPB Inhale 1 puff into the lungs daily. 07/12/17  Yes Mannam, Praveen, MD  ipratropium-albuterol (DUONEB) 0.5-2.5 (3) MG/3ML SOLN Take 3 mLs by nebulization every 6 (six) hours as needed. Patient taking differently: Take 3 mLs by nebulization every 6 (six) hours as needed (For wheezing or shortness of breath.).  04/28/17  Yes Copland, Gwenlyn FoundJessica C, MD  Multiple Vitamins-Minerals (CENTRUM SILVER ADULT 50+ PO) Take 1 tablet by mouth daily.    Yes [provider]  niacin 500 MG tablet Take 500 mg by mouth every morning.    Yes [provider]  Omega-3 Fatty Acids (FISH OIL) 1000 MG CAPS Take 2,000 mg by mouth every morning.    Yes [provider]  omeprazole (PRILOSEC) 20 MG capsule Take 1 capsule (20 mg total) by mouth every morning. 07/30/16  Yes Wendling, Jilda RocheNicholas Paul, DO  Polyethyl Glycol-Propyl Glycol (SYSTANE OP) Place 2 drops into both eyes at bedtime as needed (For dry eyes.).    Yes [provider]  predniSONE (DELTASONE) 10 MG tablet 40mg X3 days, 30mg  X3 days, 20mg  X3 days, 10mg X3 days, then stop. Patient taking differently: Take 10-40 mg by mouth See admin instructions. 40mg X3 days, 30mg  X3 days, 20mg  X3 days, 10mg X3 days, then stop. 07/12/17  Yes Mannam, Praveen, MD  Probiotic Product (PROBIOTIC PO) Take 1 capsule by mouth every morning.    Yes [provider]  sertraline (ZOLOFT) 25 MG tablet TAKE 1 TABLET BY MOUTH DAILY Patient taking differently: TAKE 25MG  BY MOUTH DAILY 05/10/17  Yes Sharlene DoryWendling, Nicholas Paul, DO  tamsulosin (FLOMAX) 0.4 MG CAPS capsule TAKE 2 CAPSULES BY MOUTH EVERY MORNING Patient taking differently: TAKE 2 CAPSULES (0.8MG ) BY MOUTH EVERY MORNING 05/31/17  Yes Sharlene DoryWendling, Nicholas Paul, DO  trimethoprim (TRIMPEX) 100 MG tablet Take 100 mg by mouth daily.  07/03/16  Yes [provider]  cephALEXin (KEFLEX) 500 MG capsule Take 1 capsule (500 mg total) by mouth 4 (four) times daily for 7 days. 07/15/17 07/22/17  Michela PitcherFawze, Raeana Blinn A, PA-C  morphine (MSIR) 15 MG tablet Take 1 tablet (15 mg total) by mouth every 6 (six) hours as needed for severe pain. 07/15/17   Luevenia MaxinFawze, Alann Avey A, PA-C  ondansetron (ZOFRAN ODT) 4 MG disintegrating tablet Take 1 tablet (4 mg total) by mouth every 8 (eight) hours as needed for nausea or vomiting. 07/15/17   Jeanie SewerFawze, Taym Twist A, PA-C    Family History Family History  Problem Relation Age of Onset  . Diabetes Mother   . Parkinson's disease Father     . Heart disease Father     Social History Social History   Tobacco Use  . Smoking status: Former Smoker    Packs/day: 1.50    Years: 55.00    Pack years: 82.50    Types: Cigarettes    Last attempt to quit: 05/27/2013    Years since quitting: 4.1  . Smokeless tobacco: Current User    Types: Chew  . Tobacco comment: occasional chew tobacco  Substance Use Topics  . Alcohol use: No    Alcohol/week: 0.0 oz  . Drug use: No     Allergies   Hydromorphone  and Oxycodone   Review of Systems Review of Systems  Constitutional: Negative for chills and fever.  Respiratory: Negative for shortness of breath.   Cardiovascular: Negative for chest pain.  Gastrointestinal: Positive for abdominal pain, nausea and vomiting. Negative for blood in stool, constipation and diarrhea.  Genitourinary: Positive for difficulty urinating and flank pain. Negative for hematuria.  All other systems reviewed and are negative.    Physical Exam Updated Vital Signs BP (!) 153/84   Pulse 79   Temp (!) 97.5 F (36.4 C) (Oral)   Resp 18   SpO2 93%   Physical Exam  Constitutional: He appears well-developed and well-nourished. No distress.  HENT:  Head: Normocephalic and atraumatic.  Eyes: Conjunctivae are normal. Right eye exhibits no discharge. Left eye exhibits no discharge.  Neck: No JVD present. No tracheal deviation present.  Cardiovascular: Normal rate and regular rhythm.  2+ pitting edema of the bilateral lower extremities, patient states this is chronic and unchanged  Pulmonary/Chest: He has wheezes. He has rales.  Diffuse scattered expiratory wheezing and rhonchi, equal rise and fall of chest  Abdominal: Soft. Bowel sounds are normal. He exhibits no distension. There is tenderness. There is no guarding.  Right CVA tenderness present.  Murphy sign absent, Rovsing's absent, no tenderness to palpation McBurney's point.  Musculoskeletal: He exhibits no edema.  Right paralumbar muscle tenderness  noted, no midline spine tenderness.  No deformity, crepitus, or step-off noted.  Neurological: He is alert.  Skin: Skin is warm and dry. No erythema.  Psychiatric: He has a normal mood and affect. His behavior is normal.  Nursing note and vitals reviewed.    ED Treatments / Results  Labs (all labs ordered are listed, but only abnormal results are displayed) Labs Reviewed  CBC WITH DIFFERENTIAL/PLATELET - Abnormal; Notable for the following components:      Result Value   WBC 16.0 (*)    Neutro Abs 13.2 (*)    Monocytes Absolute 1.4 (*)    All other components within normal limits  COMPREHENSIVE METABOLIC PANEL - Abnormal; Notable for the following components:   Glucose, Bld 161 (*)    BUN 27 (*)    All other components within normal limits  URINALYSIS, ROUTINE W REFLEX MICROSCOPIC - Abnormal; Notable for the following components:   APPearance HAZY (*)    Hgb urine dipstick MODERATE (*)    Leukocytes, UA MODERATE (*)    Bacteria, UA RARE (*)    Squamous Epithelial / LPF 0-5 (*)    All other components within normal limits  URINE CULTURE  LIPASE, BLOOD    EKG None  Radiology Ct Abdomen Pelvis W Contrast  Result Date: 07/15/2017 CLINICAL DATA:  Acute right flank pain. EXAM: CT ABDOMEN AND PELVIS WITH CONTRAST TECHNIQUE: Multidetector CT imaging of the abdomen and pelvis was performed using the standard protocol following bolus administration of intravenous contrast. CONTRAST:  ISOVUE-300 IOPAMIDOL (ISOVUE-300) INJECTION 61% COMPARISON:  04/15/2015 FINDINGS: Lower chest: Bibasilar scarring changes and mild bronchiectasis. No acute pulmonary findings or worrisome pulmonary lesions. The heart is upper limits of normal in size. No pericardial effusion. Coronary artery calcifications are noted along with moderate aortic calcifications. The distal esophagus is grossly normal. There is a small hiatal hernia. Hepatobiliary: No focal hepatic lesions. The gallbladder is surgically  absent. Mild associated intra and extrahepatic biliary dilatation. Pancreas: Advanced fatty atrophy. No mass, inflammation or ductal dilatation. Stable moderate-sized duodenum diverticulum near the pancreatic head. Spleen: Normal size.  No focal lesions.  Adrenals/Urinary Tract: The adrenal glands are normal. High-grade obstructive findings involving the right kidney with right-sided hydroureteronephrosis down to an obstructing 5 mm right UVJ calculus. Marked perinephric interstitial changes and fluid. There are multiple right-sided renal calculi and a small midpole left renal calculus. There are bilateral cysts. Moderate areas of renal scarring involving the right kidney. The bladder is unremarkable. Stomach/Bowel: The stomach, duodenum, small bowel and colon are grossly normal without oral contrast. No acute inflammatory changes, mass lesions or obstructive findings. Fairly extensive descending colon diverticulosis but no findings for acute diverticulitis. Vascular/Lymphatic: Advanced atherosclerotic calcifications involving the aorta and branch vessels. No mesenteric or retroperitoneal mass or adenopathy. Reproductive: The prostate gland and seminal vesicles are unremarkable. Other: No pelvic mass or adenopathy. No inguinal mass or adenopathy. Small left inguinal hernia containing fat is noted. Musculoskeletal: No significant bony findings. Surgical changes involving the lumbar spine are noted. IMPRESSION: 1. 5 mm right UVJ calculus causing a high-grade obstruction. 2. Bilateral renal calculi. Electronically Signed   By: Rudie Meyer M.D.   On: 07/15/2017 12:28    Procedures Procedures (including critical care time)  Medications Ordered in ED Medications  iopamidol (ISOVUE-300) 61 % injection (has no administration in time range)  sodium chloride 0.9 % injection (has no administration in time range)  HYDROmorphone (DILAUDID) injection 0.5 mg (0.5 mg Intravenous Given 07/15/17 1043)  ondansetron (ZOFRAN)  injection 4 mg (4 mg Intravenous Given 07/15/17 1044)  iopamidol (ISOVUE-300) 61 % injection 100 mL (100 mLs Intravenous Contrast Given 07/15/17 1139)  HYDROmorphone (DILAUDID) injection 1 mg (1 mg Intravenous Given 07/15/17 1246)  cefTRIAXone (ROCEPHIN) 1 g in sodium chloride 0.9 % 100 mL IVPB (0 g Intravenous Stopped 07/15/17 1317)     Initial Impression / Assessment and Plan / ED Course  I have reviewed the triage vital signs and the nursing notes.  Pertinent labs & imaging results that were available during my care of the patient were reviewed by me and considered in my medical decision making (see chart for details).     Patient presents today for evaluation of acute onset of right-sided flank pain this morning with associated nausea and vomiting.  He is afebrile, somewhat hypertensive in the ED with improvement on reevaluation.  He has no significant tenderness to palpation of the abdomen but does have right-sided CVA tenderness.  UA is concerning for UTI versus nephrolithiasis/infected stone.  Lab work reviewed by me does show a leukocytosis, no significant electrolyte abnormalities and creatinine is within normal limits although he does have a slightly elevated BUN.  He has wheezes and rhonchi on auscultation of his lungs but declines breathing treatment while in the ED.  He is currently being treated for bronchitis with antibiotics and prednisone by his pulmonologist.  CT scan of the abdomen shows a 5 mm right UVJ calculus causing high-grade obstructive changes to the right kidney. Doubt other acute surgical abdominal pathology causing the patient's symptoms.  Spoke with Dr. Kathlene November with urology who will evaluate patient and make further recommendations.  Dr. Kathlene November recommends discharge with pain medicine, continued Flomax, and Keflex for UTI.  He is not a good surgical candidate and does not appear to be acutely septic at this time.  He has tolerated multiple rounds of IV fluids and pain  medicine with significant improvement in his symptoms.  He has tolerated p.o. fluids in the ED.  Will discharge with MSIR tablets.  He has an appointment to follow-up with urology on Monday at 9 AM.  Discussed indications for return to the ED.  Patient and patient's wife verbalized understanding of and agreement with plan and patient is stable for discharge home at this time.  Patient seen and evaluated by Dr. Anitra Lauth who agrees with assessment and plan at this time.  Final Clinical Impressions(s) / ED Diagnoses   Final diagnoses:  Right ureteral calculus  Hydronephrosis with urinary obstruction due to ureteral calculus    ED Discharge Orders        Ordered    morphine (MSIR) 15 MG tablet  Every 6 hours PRN     07/15/17 1450    cephALEXin (KEFLEX) 500 MG capsule  4 times daily     07/15/17 1450    ondansetron (ZOFRAN ODT) 4 MG disintegrating tablet  Every 8 hours PRN     07/15/17 1450        Colter Magowan, [place] A, PA-C 07/15/17 1544    Gwyneth Sprout, MD 07/15/17 1552

## 2017-07-15 NOTE — Op Note (Addendum)
PATIENT:  Evan [name]  Preoperative diagnosis:  1. Right nephrolithiasis   Postoperative diagnosis:  1. Same   Procedure:  1. Cystoscopy 2. Right ureteral stent placement (6Frx24cm without string)  3. [id number] retrograde pyelography with interpretation  Surgeon: Dr. Vernie Ammonsttelin, M.D.  Anesthesia: General  Complications: None  EBL: Minimal  Specimens: None  Indication: Evan [name] is a 77 y.o. male with a 5mm R UVJ stone with obstruction and sepsis. After reviewing the management options for treatment, they have elected to proceed with the above surgical procedure(s). We have discussed the potential benefits and risks of the procedure, side effects of the proposed treatment, the likelihood of the patient achieving the goals of the procedure, and any potential problems that might occur during the procedure or recuperation. Informed consent has been obtained.  Findings: Normal urethra, TURP defect. Capacious, trabeculated bladder. Left UO with clear efflux. Apparent Hutch diverticulum on the right, obscuring the R UO, eventually found 2mm stone seen in bladder, evacuated. Debris without puss effluxed from UO after wire placement, cultured. R RPG revealed mild hydro without obvious evidence of stone. 6Frx24cm stent without string placed without issue.   Description of procedure:   The patient was taken to the operating room and general anesthesia was induced.  The patient was placed in the dorsal lithotomy position, prepped and draped in the usual sterile fashion, and preoperative antibiotics were administered. A preoperative time-out was performed.   Cystourethroscopy was performed.  The patient's urethra was examined and was normal apart from his expected TUR defect. The bladder was then systematically examined in its entirety, which revealed the above findings.   Attention then turned to the right ureteral orifice and a ureteral catheter was used to intubate the  ureteral orifice. Hydronephrotic drip was noted and cultured. Omnipaque contrast was injected through the ureteral catheter and a retrograde pyelogram which revealed the above findings.  A Sensor guidewire was then advanced up the right ureter into the renal pelvis under fluoroscopic guidance.A 6Frx24cm JJ ureteral stent was advance over the wire using Seldinger technique.  The stent was positioned appropriately under fluoroscopic and cystoscopic guidance.  The wire was then removed with an adequate stent curl noted in the renal pelvis as well as in the bladder.  A 16Fr Foley was placed, with 10cc in balloon. The patient appeared to tolerate the procedure well and without complications.  The patient was able to be awakened and transferred to the recovery unit in satisfactory condition.   Post-op Plan: - Admit to hospital to medicine team - Continue antibiotics, follow cultures - Can remove Foley once stable x 24 hours. He will need to resume his BID cath regimen once Foley is removed.  - Will arrange outpatient follow up and stone removal in the coming weeks.  -   I was present and assisted with all aspects of the operation.

## 2017-07-15 NOTE — Telephone Encounter (Signed)
Attempted to call pt's spouse Lupita LeashDonna, but no answer.  Left message for Lupita LeashDonna to return our call x1

## 2017-07-15 NOTE — Anesthesia Procedure Notes (Signed)
Arterial Line Insertion Start/End3/29/2019 11:35 PM, 07/15/2017 11:43 PM Performed by: Jairo BenJackson, Rahmel Nedved, MD, anesthesiologist  Patient location: PACU. Preanesthetic checklist: patient identified, IV checked, risks and benefits discussed, surgical consent, monitors and equipment checked, pre-op evaluation, timeout performed and anesthesia consent Left, radial was placed Catheter size: 20 G Hand hygiene performed , maximum sterile barriers used  and Seldinger technique used  Attempts: 1 Procedure performed without using ultrasound guided technique. Following insertion, dressing applied and Biopatch. Post procedure assessment: unchanged  Patient tolerated the procedure well with no immediate complications.

## 2017-07-15 NOTE — Progress Notes (Addendum)
Pharmacy Antibiotic Note  Evan HillockRichard A [name] is a 77 y.o. male admitted on 07/15/2017 with sepsis.    Plan: Cefepime 2 g q12h Vanc 1 g x 1 then 750 mg q12h Monitor renal fx cx vt prn  Weight: 220 lb (99.8 kg)  Temp (24hrs), Avg:98.8 F (37.1 C), Min:97.5 F (36.4 C), Max:100.1 F (37.8 C)  Recent Labs  Lab 07/12/17 1007 07/15/17 1043 07/15/17 1758 07/15/17 1806  WBC 8.3 16.0* 16.2*  --   CREATININE  --  0.91 1.33*  --   LATICACIDVEN  --   --   --  4.39*    Estimated Creatinine Clearance: 52.3 mL/min (A) (by C-G formula based on SCr of 1.33 mg/dL (H)).    Allergies  Allergen Reactions  . Hydromorphone Itching  . Oxycodone Itching and Nausea Only   Isaac BlissMichael Janae Bonser, PharmD, BCPS, BCCCP Clinical Pharmacist Clinical phone for 07/15/2017 from 1430 [phone]: [phone] If after 2300, please call main pharmacy at: [phone] 07/15/2017 8:08 PM

## 2017-07-15 NOTE — Consult Note (Addendum)
Urology Consult Note   Requesting Attending Physician:  Carin Hock, MD Service Providing Consult: Urology  Consulting Attending: Dr. Vernie Ammons   Reason for Consult:  Right flank pain  HPI: Evan [name] is seen in consultation for reasons noted above at the request of [name], [name], MD for evaluation of right flank pain.  This is a 77 y.o. male with multiple medical problems inclduing COPD, chronic bronchitis, and BPH s/p TURP in 2018 on CIC, who presents with 1 day of right flank pain.  The pain began last night, and worsened overnight to the point where he awoke at 3 AM this morning with sharp right flank and right lower abdominal pain.  No fevers, chills.  He vomited twice.  He catheterizes twice daily for urinary retention, and has had no changes or difficulty with this.  He presented to the ED, where he was found with normal vitals, and a CT revealed a 5 mm right distal ureteral stone with proximal hydro-.  He he was seen by urology in the ED, where medical expulsive therapy versus stent placement was discussed.  Given his stable status, and resolution of pain, the patient and his wife opted for medical expulsive therapy as an outpatient.  Upon arrival home, the patient felt lightheaded and weak, and they represented to the ED, where at this point he was found to be tachycardic, and hypotensive.  Past Medical History: Past Medical History:  Diagnosis Date  . Abnormality of gait    multiple cervical spondylosis  . Anxiety   . Bilateral leg weakness    due to back surgeries--  mostly uses wheelchair and at home very short distant w/ walker  . BPH (benign prostatic hyperplasia)   . Cervical spondylosis without myelopathy    per dr cram (neuro surg.)  . Chronic back pain   . Chronic gout   . Depression   . Diverticulosis of colon   . Emphysema/COPD (HCC)   . Full dentures   . GERD (gastroesophageal reflux disease)   . Hard of hearing    does not wear hearing  aides  . Hemorrhoids   . Hyperlipidemia   . Lower urinary tract symptoms (LUTS)   . Numbness    hands and related to neck  . OA (osteoarthritis)    hands  . Self-catheterizes urinary bladder   . Wears glasses     Past Surgical History:  Past Surgical History:  Procedure Laterality Date  . ABDOMINAL HERNIA REPAIR  1970's  . ANTERIOR CERVICAL DECOMP/DISCECTOMY FUSION  07/03/2001   C4 -- C5/  post op Exploration and evacuation cervical hematoma  . BLEPHAROPLASTY  2008  . CARDIOVASCULAR STRESS TEST  05/27/1998   normal nuclear study w/ no ischemia/  normal LV function and wall motion , ef 64%  . CARPAL TUNNEL RELEASE Left 2003 approx.  . CERVICAL FUSION  1995   C5 -- C6  . COLONOSCOPY WITH PROPOFOL N/A 02/10/2016   Procedure: COLONOSCOPY WITH PROPOFOL;  Surgeon: Hilarie Fredrickson, MD;  Location: [facility];  Service: Endoscopy;  Laterality: N/A;  . CYSTO/  LEFT RETROGRADE PYELOGRAM/  BALLOON DILATION LEFT URETER/  URETEROSCOPY/  STENT PLACEMENT  07/28/2000  . ESOPHAGOGASTRODUODENOSCOPY    . EXPLORATION AND RE-DO CERVICAL FUSION C4--5 AND ANTERIOR CERVIAL DISKECTOMY FUSION C3--4  08/09/2007   POST-OP EXPLORATION AND EVACUATION RETROPHARYNGEAL HEMATOMA  . LAPAROSCOPIC CHOLECYSTECTOMY  1990'S  . LUMBAR LAMINECTOMY/DECOMPRESSION MICRODISCECTOMY  10/29/2011   Procedure: LUMBAR LAMINECTOMY/DECOMPRESSION MICRODISCECTOMY 1 LEVEL;  Surgeon: Jillyn Hidden  Alease MedinaP Cram, MD;  Location: [facility];  Service: Neurosurgery;  Laterality: Right;  Right Lumbar Three-Four Lumbar Laminectomy/Microdiscectomy  . MAXIMUM ACCESS (MAS)POSTERIOR LUMBAR INTERBODY FUSION (PLIF) 1 LEVEL N/A 07/22/2014   Procedure: FOR MAXIMUM ACCESS (MAS) POSTERIOR LUMBAR INTERBODY FUSION (PLIF) 1 LEVEL;  Surgeon: Donalee CitrinGary Cram, MD;  Location: [facility];  Service: Neurosurgery;  Laterality: N/A;  FOR MAXIMUM ACCESS (MAS) POSTERIOR LUMBAR INTERBODY FUSION (PLIF) 1 LEVEL L4-5  . NEUROPLASTY / TRANSPOSITION ULNAR NERVE AT ELBOW Left 07/23/2008  .  POSTERIOR CERVICAL FUSION/FORAMINOTOMY  06/09/2011   Procedure: POSTERIOR CERVICAL FUSION/FORAMINOTOMY LEVEL 4;  Surgeon: Mariam DollarGary P Cram, MD;  Location: [facility];  Service: Neurosurgery;  Laterality: N/A;  Cervical three to seven  posterior cervical fusion, Cervical four to seven Laminectomy, bone graft from right iliac crest   . POSTERIOR LUMBAR LAMINECTOMY AND DISKECTOMY  10/15/2009   L3 -- L4  . TONSILLECTOMY AND ADENOIDECTOMY  child  . TRANSTHORACIC ECHOCARDIOGRAM  11/27/2013   grade 1 diastolic dysfunction, ef 50-55%/  trivial AR and PR/ mild MR and TR/  PASP 31mmHg  . TRANSURETHRAL RESECTION OF PROSTATE N/A 05/31/2016   Procedure: TRANSURETHRAL RESECTION OF THE PROSTATE (TURP);  Surgeon: Jethro BolusSigmund Tannenbaum, MD;  [facility];  Service: Urology;  Laterality: N/A;    Medication: Current Facility-Administered Medications  Medication Dose Route Frequency Provider Last Rate Last Dose  . [MAR Hold] 0.9 %  sodium chloride infusion  250 mL Intravenous PRN Tobey GrimEubanks, Katalina M, NP      . 0.9 %  sodium chloride infusion   Intravenous Continuous Tobey GrimEubanks, Katalina M, NP 100 mL/hr at 07/15/17 2041    . [MAR Hold] acetaminophen (TYLENOL) tablet 650 mg  650 mg Oral Q6H PRN Tobey GrimEubanks, Katalina M, NP      . Mitzi Hansen[MAR Hold] azithromycin (ZITHROMAX) 250 mg in dextrose 5 % 125 mL IVPB  250 mg Intravenous Q24H Tobey GrimEubanks, Katalina M, NP      . Mitzi Hansen[MAR Hold] ceFEPIme (MAXIPIME) 2 g in sodium chloride 0.9 % 100 mL IVPB  2 g Intravenous Q12H Tobey GrimEubanks, Katalina M, NP      . Mitzi Hansen[MAR Hold] fentaNYL (SUBLIMAZE) injection 12.5-25 mcg  12.5-25 mcg Intravenous Q2H PRN Tobey GrimEubanks, Katalina M, NP      . Mitzi Hansen[MAR Hold] heparin injection 5,000 Units  5,000 Units Subcutaneous Q8H Tobey GrimEubanks, Katalina M, NP      . Mitzi Hansen[MAR Hold] insulin aspart (novoLOG) injection 2-6 Units  2-6 Units Subcutaneous Q4H Tobey GrimEubanks, Katalina M, NP      . Mitzi Hansen[MAR Hold] ipratropium-albuterol (DUONEB) 0.5-2.5 (3) MG/3ML nebulizer solution 3 mL  3 mL Nebulization  Q6H PRN Tobey GrimEubanks, Katalina M, NP      . Mitzi Hansen[MAR Hold] ondansetron [facility](ZOFRAN) injection 4 mg  4 mg Intravenous Q6H PRN Tobey GrimEubanks, Katalina M, NP      . Mitzi Hansen[MAR Hold] pantoprazole (PROTONIX) EC tablet 40 mg  40 mg Oral Daily Tobey GrimEubanks, Katalina M, NP      . Mitzi Hansen[MAR Hold] phenylephrine (NEO-SYNEPHRINE) 10 mg in sodium chloride 0.9 % 250 mL (0.04 mg/mL) infusion  0-200 mcg/min Intravenous Titrated Tobey GrimEubanks, Katalina M, NP 60 mL/hr at 07/15/17 2054 40 mcg/min at 07/15/17 2054  . [MAR Hold] predniSONE (DELTASONE) tablet 30 mg  30 mg Oral Q breakfast Tobey GrimEubanks, Katalina M, NP      . Mitzi Hansen[MAR Hold] tamsulosin (FLOMAX) capsule 0.8 mg  0.8 mg Oral QPC breakfast Tobey GrimEubanks, Katalina M, NP      . Mitzi Hansen[MAR Hold] vancomycin (VANCOCIN) IVPB 750 mg/150 ml premix  750 mg Intravenous Q12H Bertram Millard, RPH       Facility-Administered Medications Ordered in Other Encounters  Medication Dose Route Frequency Provider Last Rate Last Dose  . albuterol (PROVENTIL HFA;VENTOLIN HFA) 108 (90 Base) MCG/ACT inhaler             Allergies: Allergies  Allergen Reactions  . Hydromorphone Itching  . Oxycodone Itching and Nausea Only    Social History: Social History   Tobacco Use  . Smoking status: Former Smoker    Packs/day: 1.50    Years: 55.00    Pack years: 82.50    Types: Cigarettes    Last attempt to quit: 05/27/2013    Years since quitting: 4.1  . Smokeless tobacco: Current User    Types: Chew  . Tobacco comment: occasional chew tobacco  Substance Use Topics  . Alcohol use: No    Alcohol/week: 0.0 oz  . Drug use: No    Family History Family History  Problem Relation Age of Onset  . Diabetes Mother   . Parkinson's disease Father   . Heart disease Father     Review of Systems 10 systems were reviewed and are negative except as noted specifically in the HPI.  Objective   Vital signs in last 24 hours: BP (!) 89/69   Pulse 99   Temp 100.1 F (37.8 C) (Oral)   Resp 17   Wt 99.8 kg (220 lb)   SpO2 94%   BMI 35.51  kg/m   Physical Exam General: NAD, A&O, resting, appropriate.  Appears older than stated age HEENT: [place]/AT, EOMI, MMM Pulmonary: Normal work of breathing Cardiovascular: HDS, adequate peripheral perfusion Abdomen: Soft, NTTP, nondistended GU: No CVA tenderness Extremities: warm and well perfused Neuro: Appropriate, no focal neurological deficits  Most Recent Labs: Lab Results  Component Value Date   WBC 16.2 (H) 07/15/2017   HGB 12.4 (L) 07/15/2017   HCT 38.1 (L) 07/15/2017   PLT 219 07/15/2017    Lab Results  Component Value Date   NA 138 07/15/2017   K 4.1 07/15/2017   CL 106 07/15/2017   CO2 19 (L) 07/15/2017   BUN 27 (H) 07/15/2017   CREATININE 1.33 (H) 07/15/2017   CALCIUM 8.6 (L) 07/15/2017    Lab Results  Component Value Date   INR 1.11 07/15/2017   APTT 29 12/07/2015     IMAGING: Ct Abdomen Pelvis W Contrast  Result Date: 07/15/2017 CLINICAL DATA:  Acute right flank pain. EXAM: CT ABDOMEN AND PELVIS WITH CONTRAST TECHNIQUE: Multidetector CT imaging of the abdomen and pelvis was performed using the standard protocol following bolus administration of intravenous contrast. CONTRAST:  ISOVUE-300 IOPAMIDOL (ISOVUE-300) INJECTION 61% COMPARISON:  04/15/2015 FINDINGS: Lower chest: Bibasilar scarring changes and mild bronchiectasis. No acute pulmonary findings or worrisome pulmonary lesions. The heart is upper limits of normal in size. No pericardial effusion. Coronary artery calcifications are noted along with moderate aortic calcifications. The distal esophagus is grossly normal. There is a small hiatal hernia. Hepatobiliary: No focal hepatic lesions. The gallbladder is surgically absent. Mild associated intra and extrahepatic biliary dilatation. Pancreas: Advanced fatty atrophy. No mass, inflammation or ductal dilatation. Stable moderate-sized duodenum diverticulum near the pancreatic head. Spleen: Normal size.  No focal lesions. Adrenals/Urinary Tract: The adrenal  glands are normal. High-grade obstructive findings involving the right kidney with right-sided hydroureteronephrosis down to an obstructing 5 mm right UVJ calculus. Marked perinephric interstitial changes and fluid. There are multiple right-sided renal calculi and a small midpole  left renal calculus. There are bilateral cysts. Moderate areas of renal scarring involving the right kidney. The bladder is unremarkable. Stomach/Bowel: The stomach, duodenum, small bowel and colon are grossly normal without oral contrast. No acute inflammatory changes, mass lesions or obstructive findings. Fairly extensive descending colon diverticulosis but no findings for acute diverticulitis. Vascular/Lymphatic: Advanced atherosclerotic calcifications involving the aorta and branch vessels. No mesenteric or retroperitoneal mass or adenopathy. Reproductive: The prostate gland and seminal vesicles are unremarkable. Other: No pelvic mass or adenopathy. No inguinal mass or adenopathy. Small left inguinal hernia containing fat is noted. Musculoskeletal: No significant bony findings. Surgical changes involving the lumbar spine are noted. IMPRESSION: 1. 5 mm right UVJ calculus causing a high-grade obstruction. 2. Bilateral renal calculi. Electronically Signed   By: Rudie Meyer M.D.   On: 07/15/2017 12:28   Dg Chest Portable 1 View  Result Date: 07/15/2017 CLINICAL DATA:  Short of breath EXAM: PORTABLE CHEST 1 VIEW COMPARISON:  07/12/2017 CT chest 02/08/2017 FINDINGS: Partially visualized hardware in the cervical spine. Atelectasis and or scarring at the left base and lingula. No acute consolidation or effusion. Stable cardiomediastinal silhouette with aortic atherosclerosis. Emphysema. IMPRESSION: 1. Emphysematous disease. Linear scarring and/or atelectasis at the left base and lingula Electronically Signed   By: Jasmine Pang M.D.   On: 07/15/2017 18:33    ------  Assessment:  77 y.o. male with COPD, BPH on CIC, presenting with a  5 mm right UVJ stone with proximal hydro-, as well assome other non-obstructing right renal stones.  Presented earlier today with normal vitals, and discussed medical expulsive therapy versus stent placement at that time.  Given his multiple medical comorbidities, the patient elected for medical expulsive therapy, however he returns now with low-grade fever, tachycardia, and hypotension.  Discussed that given his change in status, would recommend stent placement at this time.  The patient is in agreement, and is eager to proceed.  Risks and benefits discussed in detail.   Recommendations: -OR for emergent right ureteral stent placement   Thank you for this consult. Please contact the urology consult pager with any further questions/concerns.  Patient was seen, examined,treatment plan was discussed with the resident.  I have directly reviewed the clinical findings, lab, imaging studies and management of this patient in detail. I have made the necessary changes and/or additions to the above noted documentation, and agree with the documentation, as recorded by the resident.

## 2017-07-15 NOTE — Discharge Instructions (Addendum)
Please take all of your antibiotics until finished!   You may develop abdominal discomfort or diarrhea from the antibiotic.  You may help offset this with probiotics which you can buy or get in yogurt. Do not eat  or take the probiotics until 2 hours after your antibiotic.   Take Zofran as needed for nausea.  Wait around 20 minutes before having some to eat or drink to give this medication time to work.  Take Flomax as prescribed.  Avoid taking ibuprofen while taking prednisone as this may you may take cause upset stomach problems or even a stomach ulcer.  You may take ibuprofen for pain when you are done with the prednisone.  Follow-up with urology on Monday as scheduled.  Return to the emergency department if any concerning signs or symptoms develop such as fever greater than 100.5 F, persistent nausea and vomiting, or worsening pain.

## 2017-07-15 NOTE — ED Notes (Signed)
MD at bedside. 

## 2017-07-15 NOTE — ED Triage Notes (Signed)
Pt presents with right sided flank pain since this morning. Has hx of kidney stones. No N/V or pain while urinating.

## 2017-07-15 NOTE — ED Notes (Signed)
[facility]

## 2017-07-15 NOTE — ED Notes (Signed)
Bed: [id number] Expected date:  Expected time:  Means of arrival:  Comments: EMS 77 yo right side pain.

## 2017-07-15 NOTE — ED Provider Notes (Signed)
[facility] EMERGENCY DEPARTMENT Provider Note   CSN: [id number] Arrival date & time: 07/15/17  1743     History   Chief Complaint Chief Complaint  Patient presents with  . Shortness of Breath    HPI Evan [name] is a 77 y.o. male.  The history is provided by the patient.  Shortness of Breath  This is a new problem. The average episode lasts 30 minutes. The problem occurs continuously.The problem has been rapidly improving. Associated symptoms include a fever, cough, vomiting, abdominal pain (R flank pain) and leg swelling. Pertinent negatives include no headaches, no sore throat, no ear pain, no neck pain, no chest pain and no rash. Associated medical issues include COPD.  -Patient is a 77 year old male with history as below who presents hypotensive and short of breath.  On arrival his shortness of breath has significantly improved.  Initial blood pressure is 70 systolic.  Patient was seen earlier at [facility] long ED for right flank pain was found to have a 5 mm right UVJ stone with high-grade obstruction and UTI.  He was normotensive and afebrile there.  On the way home he started becoming lightheaded and short of breath and was too weak to walk out of the car.  He was brought here by ambulance.  Past Medical History:  Diagnosis Date  . Abnormality of gait    multiple cervical spondylosis  . Anxiety   . Bilateral leg weakness    due to back surgeries--  mostly uses wheelchair and at home very short distant w/ walker  . BPH (benign prostatic hyperplasia)   . Cervical spondylosis without myelopathy    per dr cram (neuro surg.)  . Chronic back pain   . Chronic gout   . Depression   . Diverticulosis of colon   . Emphysema/COPD (HCC)   . Full dentures   . GERD (gastroesophageal reflux disease)   . Hard of hearing    does not wear hearing aides  . Hemorrhoids   . Hyperlipidemia   . Lower urinary tract symptoms (LUTS)   . Numbness    hands and  related to neck  . OA (osteoarthritis)    hands  . Self-catheterizes urinary bladder   . Wears glasses     Patient Active Problem List   Diagnosis Date Noted  . Sepsis (HCC) 07/15/2017  . Ureteropelvic junction (UPJ) obstruction   . Mixed simple and mucopurulent chronic bronchitis (HCC)   . Chronic bronchitis with emphysema (HCC) 05/31/2017  . Bilateral hearing loss 04/25/2017  . Parkinsonism (HCC) 09/03/2016  . Right leg weakness 09/03/2016  . Diverticulosis of colon with hemorrhage   . UTI (urinary tract infection) 12/09/2015  . Rectal bleed 12/07/2015  . Chronic diastolic CHF (congestive heart failure) (HCC) 12/07/2015  . Rectal bleeding 12/07/2015  . Abnormality of gait 10/29/2015  . 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 Weakness of both legs 07/27/2015  . Shortness of breath on exertion 07/24/2015  . Tremor 07/24/2015  . Acute stress reaction 07/24/2015  . Screening, ischemic heart disease 01/29/2015  . Hernia of abdominal cavity 12/20/2014  . Gastroesophageal reflux disease without esophagitis 09/03/2014  . Depression with anxiety 09/03/2014  . BPH (benign prostatic hyperplasia) 09/03/2014  . Gout, chronic 09/03/2014  . Spondylolisthesis at L4-L5 level 07/22/2014  . Diastolic dysfunction with heart failure (HCC) 01/01/2014  . Peripheral neuropathy 01/01/2014  . DOE (dyspnea on exertion) 11/15/2013  . Leg edema 11/15/2013  . Snoring 11/15/2013  . 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 ABDOMINAL HERNIA REPAIR  1970's  . ANTERIOR CERVICAL DECOMP/DISCECTOMY FUSION  07/03/2001   C4 -- C5/  post op Exploration and evacuation cervical hematoma  . BLEPHAROPLASTY  2008  . CARDIOVASCULAR STRESS TEST  05/27/1998   normal nuclear study w/ no ischemia/  normal LV function and wall motion , ef 64%  . CARPAL TUNNEL RELEASE Left 2003 approx.  . CERVICAL FUSION  1995   C5 -- C6  . COLONOSCOPY WITH PROPOFOL  N/A 02/10/2016   Procedure: COLONOSCOPY WITH PROPOFOL;  Surgeon: Hilarie Fredrickson, MD;  Location: [facility];  Service: Endoscopy;  Laterality: N/A;  . CYSTO/  LEFT RETROGRADE PYELOGRAM/  BALLOON DILATION LEFT URETER/  URETEROSCOPY/  STENT PLACEMENT  07/28/2000  . ESOPHAGOGASTRODUODENOSCOPY    . EXPLORATION AND RE-DO CERVICAL FUSION C4--5 AND ANTERIOR CERVIAL DISKECTOMY FUSION C3--4  08/09/2007   POST-OP EXPLORATION AND EVACUATION RETROPHARYNGEAL HEMATOMA  . LAPAROSCOPIC CHOLECYSTECTOMY  1990'S  . LUMBAR LAMINECTOMY/DECOMPRESSION MICRODISCECTOMY  10/29/2011   Procedure: LUMBAR LAMINECTOMY/DECOMPRESSION MICRODISCECTOMY 1 LEVEL;  Surgeon: Mariam Dollar, MD;  Location: [facility];  Service: Neurosurgery;  Laterality: Right;  Right Lumbar Three-Four Lumbar Laminectomy/Microdiscectomy  . MAXIMUM ACCESS (MAS)POSTERIOR LUMBAR INTERBODY FUSION (PLIF) 1 LEVEL N/A 07/22/2014   Procedure: FOR MAXIMUM ACCESS (MAS) POSTERIOR LUMBAR INTERBODY FUSION (PLIF) 1 LEVEL;  Surgeon: Donalee Citrin, MD;  Location: [facility];  Service: Neurosurgery;  Laterality: N/A;  FOR MAXIMUM ACCESS (MAS) POSTERIOR LUMBAR INTERBODY FUSION (PLIF) 1 LEVEL L4-5  . NEUROPLASTY / TRANSPOSITION ULNAR NERVE AT ELBOW Left 07/23/2008  . POSTERIOR CERVICAL FUSION/FORAMINOTOMY  06/09/2011   Procedure: POSTERIOR CERVICAL FUSION/FORAMINOTOMY LEVEL 4;  Surgeon: Mariam Dollar, MD;  Location: [facility];  Service: Neurosurgery;  Laterality: N/A;  Cervical three to seven  posterior cervical fusion, Cervical four to seven Laminectomy, bone graft from right iliac crest   . POSTERIOR LUMBAR LAMINECTOMY AND DISKECTOMY  10/15/2009   L3 -- L4  . TONSILLECTOMY AND ADENOIDECTOMY  child  . TRANSTHORACIC ECHOCARDIOGRAM  11/27/2013   grade 1 diastolic dysfunction, ef 50-55%/  trivial AR and PR/ mild MR and TR/  PASP  . TRANSURETHRAL RESECTION OF PROSTATE N/A 05/31/2016   Procedure: TRANSURETHRAL RESECTION OF THE PROSTATE (TURP);  Surgeon: Jethro Bolus,  MD;  Location: [facility];  Service: Urology;  Laterality: N/A;        Home Medications    Prior to Admission medications   Medication Sig Start Date End Date Taking? Authorizing Provider  albuterol (PROVENTIL HFA;VENTOLIN HFA) 108 (90 Base) MCG/ACT inhaler Inhale 2 puffs into the lungs every 6 (six) hours as needed for wheezing or shortness of breath. 04/28/17   Copland, Gwenlyn Found, MD  atorvastatin (LIPITOR) 20 MG tablet TAKE 1 TABLET BY MOUTH DAILY Patient taking differently: TAKE 20MG  BY MOUTH DAILY 05/31/17   Sharlene Dory, DO  azithromycin (ZITHROMAX) 250 MG tablet Take 2 today, then 1 daily until gone. Patient taking differently: Take 250-500 mg by mouth daily. Take 2 today, then 1 daily until gone. 07/12/17   Mannam, Colbert Coyer, MD  carbidopa-levodopa (SINEMET IR) 25-100 MG tablet Take 1 tablet by mouth 3 (three) times daily. 09/03/16   Patel, Roxana Hires K, DO  cephALEXin (KEFLEX) 500 MG capsule Take 1 capsule (500 mg total) by mouth 4 (four) times daily for 7 days. 07/15/17 07/22/17  Fawze, Mina A, PA-C  colchicine 0.6 MG tablet TAKE 1 TABLET BY  MOUTH EVERY 2 HOURS UNTIL RELIEF as needed Patient taking differently: Take 0.6 mg by mouth every 2 (two) hours as needed (For gout.).  07/14/16   Sharlene DoryWendling, Nicholas Paul, DO  diazepam (VALIUM) 5 MG tablet Take 2.5-5 mg by mouth daily as needed (For neck pain.).  05/10/16   [provider]  diclofenac sodium (VOLTAREN) 1 % GEL Apply 2 g 4 (four) times daily topically. Patient taking differently: Apply 2 g topically 4 (four) times daily as needed (For pain.).  03/07/17   Sharlene DoryWendling, Nicholas Paul, DO  febuxostat (ULORIC) 40 MG tablet Take 1 tablet (40 mg total) by mouth daily as needed. 12/23/16   Sharlene DoryWendling, Nicholas Paul, DO  Fluticasone-Umeclidin-Vilant (TRELEGY ELLIPTA) 100-62.5-25 MCG/INH AEPB Inhale 1 puff into the lungs daily. 07/12/17   Mannam, Colbert CoyerPraveen, MD  ipratropium-albuterol (DUONEB) 0.5-2.5 (3) MG/3ML SOLN Take 3 mLs by  nebulization every 6 (six) hours as needed. Patient taking differently: Take 3 mLs by nebulization every 6 (six) hours as needed (For wheezing or shortness of breath.).  04/28/17   Copland, Gwenlyn FoundJessica C, MD  morphine (MSIR) 15 MG tablet Take 1 tablet (15 mg total) by mouth every 6 (six) hours as needed for severe pain. 07/15/17   Fawze, Mina A, PA-C  Multiple Vitamins-Minerals (CENTRUM SILVER ADULT 50+ PO) Take 1 tablet by mouth daily.    [provider]  niacin 500 MG tablet Take 500 mg by mouth every morning.     [provider]  Omega-3 Fatty Acids (FISH OIL) 1000 MG CAPS Take 2,000 mg by mouth every morning.     [provider]  omeprazole (PRILOSEC) 20 MG capsule Take 1 capsule (20 mg total) by mouth every morning. 07/30/16   Sharlene DoryWendling, Nicholas Paul, DO  ondansetron (ZOFRAN ODT) 4 MG disintegrating tablet Take 1 tablet (4 mg total) by mouth every 8 (eight) hours as needed for nausea or vomiting. 07/15/17   Luevenia MaxinFawze, Mina A, PA-C  Polyethyl Glycol-Propyl Glycol (SYSTANE OP) Place 2 drops into both eyes at bedtime as needed (For dry eyes.).     [provider]  predniSONE (DELTASONE) 10 MG tablet 40mg X3 days, 30mg  X3 days, 20mg  X3 days, 10mg X3 days, then stop. Patient taking differently: Take 10-40 mg by mouth See admin instructions. 40mg X3 days, 30mg  X3 days, 20mg  X3 days, 10mg X3 days, then stop. 07/12/17   Chilton GreathouseMannam, Praveen, MD  Probiotic Product (PROBIOTIC PO) Take 1 capsule by mouth every morning.     [provider]  sertraline (ZOLOFT) 25 MG tablet TAKE 1 TABLET BY MOUTH DAILY Patient taking differently: TAKE 25MG  BY MOUTH DAILY 05/10/17   Sharlene DoryWendling, Nicholas Paul, DO  tamsulosin (FLOMAX) 0.4 MG CAPS capsule TAKE 2 CAPSULES BY MOUTH EVERY MORNING Patient taking differently: TAKE 2 CAPSULES (0.8MG ) BY MOUTH EVERY MORNING 05/31/17   Wendling, Jilda RocheNicholas Paul, DO  trimethoprim (TRIMPEX) 100 MG tablet Take 100 mg by mouth daily.  07/03/16   [provider]     Family History Family History  Problem Relation Age of Onset  . Diabetes Mother   . Parkinson's disease Father   . Heart disease Father     Social History Social History   Tobacco Use  . Smoking status: Former Smoker    Packs/day: 1.50    Years: 55.00    Pack years: 82.50    Types: Cigarettes    Last attempt to quit: 05/27/2013    Years since quitting: 4.1  . Smokeless tobacco: Current User    Types: Chew  .  Tobacco comment: occasional chew tobacco  Substance Use Topics  . Alcohol use: No    Alcohol/week: 0.0 oz  . Drug use: No     Allergies   Hydromorphone and Oxycodone   Review of Systems Review of Systems  Constitutional: Positive for chills, fatigue and fever.  HENT: Negative for ear pain and sore throat.   Eyes: Negative for visual disturbance.  Respiratory: Positive for cough and shortness of breath.   Cardiovascular: Positive for leg swelling. Negative for chest pain and palpitations.  Gastrointestinal: Positive for abdominal pain (R flank pain), nausea and vomiting. Negative for diarrhea.  Genitourinary: Positive for flank pain. Negative for dysuria and hematuria.  Musculoskeletal: Negative for back pain and neck pain.  Skin: Negative for rash.  Neurological: Positive for light-headedness. Negative for syncope and headaches.  All other systems reviewed and are negative.    Physical Exam Updated Vital Signs BP 97/70   Pulse (!) 102   Temp 100.1 F (37.8 C) (Oral)   Resp (!) 24   Wt 99.8 kg (220 lb)   SpO2 98%   BMI 35.51 kg/m   Physical Exam  Constitutional: He appears well-developed and well-nourished. He appears listless. He appears toxic. He appears ill.  HENT:  Head: Normocephalic and atraumatic.  Eyes: Conjunctivae are normal.  Neck: Neck supple.  Cardiovascular: Regular rhythm. Tachycardia present.  No murmur heard. Pulmonary/Chest: Effort normal. No stridor. No respiratory distress. He has no wheezes. He has rales (bibasialr).   Abdominal: Soft. He exhibits no distension. There is no tenderness. There is no guarding.  Mild R CVA tenderness  Musculoskeletal: He exhibits no edema.  Neurological: He appears listless.  Skin: Skin is warm and dry.  Psychiatric: He has a normal mood and affect.  Nursing note and vitals reviewed.    ED Treatments / Results  Labs (all labs ordered are listed, but only abnormal results are displayed) Labs Reviewed  COMPREHENSIVE METABOLIC PANEL - Abnormal; Notable for the following components:      Result Value   CO2 19 (*)    Glucose, Bld 172 (*)    BUN 27 (*)    Creatinine, Ser 1.33 (*)    Calcium 8.6 (*)    Total Protein 6.2 (*)    Albumin 3.0 (*)    AST 56 (*)    GFR calc non Af Amer 50 (*)    GFR calc Af Amer 58 (*)    All other components within normal limits  CBC WITH DIFFERENTIAL/PLATELET - Abnormal; Notable for the following components:   WBC 16.2 (*)    RBC 4.15 (*)    Hemoglobin 12.4 (*)    HCT 38.1 (*)    Neutro Abs 15.2 (*)    Lymphs Abs 0.4 (*)    All other components within normal limits  I-STAT CG4 LACTIC ACID, ED - Abnormal; Notable for the following components:   Lactic Acid, Venous 4.39 (*)    All other components within normal limits  CULTURE, BLOOD (ROUTINE X 2)  CULTURE, BLOOD (ROUTINE X 2)  CULTURE, EXPECTORATED SPUTUM-ASSESSMENT  PROTIME-INR  BRAIN NATRIURETIC PEPTIDE  PROCALCITONIN  PROCALCITONIN  LACTIC ACID, PLASMA  LACTIC ACID, PLASMA  BASIC METABOLIC PANEL  CBC  PHOSPHORUS  MAGNESIUM  I-STAT TROPONIN, ED    EKG EKG Interpretation  Date/Time:  Friday July 15 2017 17:43:36 EDT Ventricular Rate:  108 PR Interval:    QRS Duration: 126 QT Interval:  325 QTC Calculation: 436 R Axis:   45 Text Interpretation:  Sinus or ectopic atrial tachycardia Right bundle branch block Confirmed by Rolland Porter ([id number]) on 07/15/2017 7:24:58 PM   Radiology Ct Abdomen Pelvis W Contrast  Result Date: 07/15/2017 CLINICAL DATA:  Acute right  flank pain. EXAM: CT ABDOMEN AND PELVIS WITH CONTRAST TECHNIQUE: Multidetector CT imaging of the abdomen and pelvis was performed using the standard protocol following bolus administration of intravenous contrast. CONTRAST:  ISOVUE-300 IOPAMIDOL (ISOVUE-300) INJECTION 61% COMPARISON:  04/15/2015 FINDINGS: Lower chest: Bibasilar scarring changes and mild bronchiectasis. No acute pulmonary findings or worrisome pulmonary lesions. The heart is upper limits of normal in size. No pericardial effusion. Coronary artery calcifications are noted along with moderate aortic calcifications. The distal esophagus is grossly normal. There is a small hiatal hernia. Hepatobiliary: No focal hepatic lesions. The gallbladder is surgically absent. Mild associated intra and extrahepatic biliary dilatation. Pancreas: Advanced fatty atrophy. No mass, inflammation or ductal dilatation. Stable moderate-sized duodenum diverticulum near the pancreatic head. Spleen: Normal size.  No focal lesions. Adrenals/Urinary Tract: The adrenal glands are normal. High-grade obstructive findings involving the right kidney with right-sided hydroureteronephrosis down to an obstructing 5 mm right UVJ calculus. Marked perinephric interstitial changes and fluid. There are multiple right-sided renal calculi and a small midpole left renal calculus. There are bilateral cysts. Moderate areas of renal scarring involving the right kidney. The bladder is unremarkable. Stomach/Bowel: The stomach, duodenum, small bowel and colon are grossly normal without oral contrast. No acute inflammatory changes, mass lesions or obstructive findings. Fairly extensive descending colon diverticulosis but no findings for acute diverticulitis. Vascular/Lymphatic: Advanced atherosclerotic calcifications involving the aorta and branch vessels. No mesenteric or retroperitoneal mass or adenopathy. Reproductive: The prostate gland and seminal vesicles are unremarkable. Other: No pelvic  mass or adenopathy. No inguinal mass or adenopathy. Small left inguinal hernia containing fat is noted. Musculoskeletal: No significant bony findings. Surgical changes involving the lumbar spine are noted. IMPRESSION: 1. 5 mm right UVJ calculus causing a high-grade obstruction. 2. Bilateral renal calculi. Electronically Signed   By: Rudie Meyer M.D.   On: 07/15/2017 12:28   Dg Chest Portable 1 View  Result Date: 07/15/2017 CLINICAL DATA:  Short of breath EXAM: PORTABLE CHEST 1 VIEW COMPARISON:  07/12/2017 CT chest 02/08/2017 FINDINGS: Partially visualized hardware in the cervical spine. Atelectasis and or scarring at the left base and lingula. No acute consolidation or effusion. Stable cardiomediastinal silhouette with aortic atherosclerosis. Emphysema. IMPRESSION: 1. Emphysematous disease. Linear scarring and/or atelectasis at the left base and lingula Electronically Signed   By: Jasmine Pang M.D.   On: 07/15/2017 18:33    Procedures Procedures (including critical care time)  Medications Ordered in ED Medications  0.9 %  sodium chloride infusion (has no administration in time range)  heparin injection 5,000 Units (has no administration in time range)  ipratropium-albuterol (DUONEB) 0.5-2.5 (3) MG/3ML nebulizer solution 3 mL (has no administration in time range)  ondansetron (ZOFRAN) injection 4 mg (has no administration in time range)  tamsulosin (FLOMAX) capsule 0.8 mg (has no administration in time range)  insulin aspart (novoLOG) injection 2-6 Units (has no administration in time range)  0.9 %  sodium chloride infusion (has no administration in time range)  fentaNYL (SUBLIMAZE) injection 12.5-25 mcg (has no administration in time range)  acetaminophen (TYLENOL) tablet 650 mg (has no administration in time range)  azithromycin (ZITHROMAX) 250 mg in dextrose 5 % 125 mL IVPB (has no administration in time range)  vancomycin (VANCOCIN) IVPB 750 mg/150 ml premix (has no administration in time  range)  piperacillin-tazobactam (ZOSYN) IVPB 3.375 g (has no administration in time range)  pantoprazole (PROTONIX) EC tablet 40 mg (has no administration in time range)  vancomycin (VANCOCIN) IVPB 1000 mg/200 mL premix (0 mg Intravenous Stopped 07/15/17 1935)  lactated ringers bolus 1,000 mL (0 mLs Intravenous Stopped 07/15/17 1915)  piperacillin-tazobactam (ZOSYN) IVPB 3.375 g (0 g Intravenous Stopped 07/15/17 1906)  lactated ringers bolus 2,000 mL (2,000 mLs Intravenous New Bag/Given 07/15/17 1915)     Initial Impression / Assessment and Plan / ED Course  I have reviewed the triage vital signs and the nursing notes.  Pertinent labs & imaging results that were available during my care of the patient were reviewed by me and considered in my medical decision making (see chart for details).     Patient is a 77 year old male with history of COPD, BPH, parkinsonism, diastolic CHF who presents hypotensive and short of breath.  Patient was discharged from [facility] long ED earlier today after being found to have a right kidney stone and UTI.  Patient was normotensive and afebrile at that time.  Urology did see him and recommended outpatient follow-up and p.o. antibiotics.  He did receive a dose of Rocephin prior to leaving.    Here he has a low-grade fever, mildly tachycardic and hypertensive.  Patient started with 30 cc/KG LR bolus.  Patient is responding to IV fluids slowly.  Antibiotics were broadened out to vancomycin and Zosyn.    Urology was reconsulted and they will need to perform a stent placement as the stone is causing a high-grade obstruction on the right.  Patient also likely has pyelonephritis given he has nausea vomiting, severe sepsis as well as the infected stone.  The intensivist was consulted here and they plan to do the admission, however patient will need to be transported by critical care transport to [facility] long for the urological procedure and he will be subsequently admitted in  their ICU postoperatively.  Accepting physician is Dr. Kathlene November, urology at [facility].  Our intensivist here has completed the admission and transportation will be arranged.  On reevaluation, patient's blood pressure did improve after receiving more IV fluids and now has a map above 65.  Patient has been stabilized for transportation.  Final Clinical Impressions(s) / ED Diagnoses   Final diagnoses:  Severe sepsis [facility])  Pyelonephritis  Kidney stone    ED Discharge Orders    None       Dwana Melena, DO 07/15/17 2014    Rolland Porter, MD 07/15/17 [phone]

## 2017-07-15 NOTE — Anesthesia Preprocedure Evaluation (Addendum)
Anesthesia Evaluation  Patient identified by MRN, date of birth, ID band Patient awake    Reviewed: Allergy & Precautions, NPO status , Patient's Chart, lab work & pertinent test results  History of Anesthesia Complications Negative for: history of anesthetic complications  Airway Mallampati: II  TM Distance: >3 FB Neck ROM: Full    Dental  (+) Edentulous Upper, Edentulous Lower   Pulmonary COPD (on steroids presently),  COPD inhaler, former smoker (quit 2015),     + wheezing      Cardiovascular  Rhythm:Regular Rate:Normal  Hypotensive with elevated lactate: uroseptic '15 ECHO: EF 50-55%, trivial AI, mild MR   Neuro/Psych Anxiety Depression Chronic back and neck pain (narcotics): leg weakness (walker or wheelchair)    GI/Hepatic Neg liver ROS, GERD  Medicated,  Endo/Other  Morbid obesity  Renal/GU Renal InsufficiencyRenal disease (creat 1.33)Nausea/vomiting with urosepsis     Musculoskeletal  (+) Arthritis ,   Abdominal   Peds  Hematology negative hematology ROS (+)   Anesthesia Other Findings   Reproductive/Obstetrics                            Anesthesia Physical Anesthesia Plan  ASA: IV and emergent  Anesthesia Plan: General   Post-op Pain Management:    Induction: Intravenous and Cricoid pressure planned  PONV Risk Score and Plan: 2 and Ondansetron and Treatment may vary due to age or medical condition  Airway Management Planned: Oral ETT  Additional Equipment:   Intra-op Plan:   Post-operative Plan: Possible Post-op intubation/ventilation  Informed Consent: I have reviewed the patients History and Physical, chart, labs and discussed the procedure including the risks, benefits and alternatives for the proposed anesthesia with the patient or authorized representative who has indicated his/her understanding and acceptance.     Plan Discussed with: CRNA and  Surgeon  Anesthesia Plan Comments: (Plan routine monitors, GETA )        Anesthesia Quick Evaluation

## 2017-07-15 NOTE — Consult Note (Signed)
Urology Consult Note   Requesting Attending Physician:  Gwyneth Sprout, MD Service Providing Consult: Urology  Consulting Attending: Dr. Vernie Ammons   Reason for Consult:  Right flank pain  HPI: Evan [name] is seen in consultation for reasons noted above at the request of Gwyneth Sprout, MD for evaluation of right flank pain.  This is a 77 y.o. male with multiple medical problems inclduing COPD, chronic bronchitis, and BPH s/p TURP in 2018 on CIC, who presents with 1 day of right flank pain.  The pain began last night, and worsened overnight to the point where he awoke at 3 AM this morning with sharp right flank and right lower abdominal pain.  No fevers, chills.  He vomited twice.  He catheterizes twice daily for urinary retention, and has had no changes or difficulty with this.  He presented to the ED, where he was found with normal vitals, and a CT revealed a 5 mm right distal ureteral stone with proximal hydro-.  Urology was called for evaluation.  He sees Dr. Sande Brothers with [facility].  He is on trimethoprim daily.   Past Medical History: Past Medical History:  Diagnosis Date  . Abnormality of gait    multiple cervical spondylosis  . Anxiety   . Bilateral leg weakness    due to back surgeries--  mostly uses wheelchair and at home very short distant w/ walker  . BPH (benign prostatic hyperplasia)   . Cervical spondylosis without myelopathy    per dr cram (neuro surg.)  . Chronic back pain   . Chronic gout   . Depression   . Diverticulosis of colon   . Emphysema/COPD (HCC)   . Full dentures   . GERD (gastroesophageal reflux disease)   . Hard of hearing    does not wear hearing aides  . Hemorrhoids   . Hyperlipidemia   . Lower urinary tract symptoms (LUTS)   . Numbness    hands and related to neck  . OA (osteoarthritis)    hands  . Self-catheterizes urinary bladder   . Wears glasses     Past Surgical History:  Past Surgical History:  Procedure  Laterality Date  . ABDOMINAL HERNIA REPAIR  1970's  . ANTERIOR CERVICAL DECOMP/DISCECTOMY FUSION  07/03/2001   C4 -- C5/  post op Exploration and evacuation cervical hematoma  . BLEPHAROPLASTY  2008  . CARDIOVASCULAR STRESS TEST  05/27/1998   normal nuclear study w/ no ischemia/  normal LV function and wall motion , ef 64%  . CARPAL TUNNEL RELEASE Left 2003 approx.  . CERVICAL FUSION  1995   C5 -- C6  . COLONOSCOPY WITH PROPOFOL N/A 02/10/2016   Procedure: COLONOSCOPY WITH PROPOFOL;  Surgeon: Hilarie Fredrickson, MD;  Location: [facility];  Service: Endoscopy;  Laterality: N/A;  . CYSTO/  LEFT RETROGRADE PYELOGRAM/  BALLOON DILATION LEFT URETER/  URETEROSCOPY/  STENT PLACEMENT  07/28/2000  . ESOPHAGOGASTRODUODENOSCOPY    . EXPLORATION AND RE-DO CERVICAL FUSION C4--5 AND ANTERIOR CERVIAL DISKECTOMY FUSION C3--4  08/09/2007   POST-OP EXPLORATION AND EVACUATION RETROPHARYNGEAL HEMATOMA  . LAPAROSCOPIC CHOLECYSTECTOMY  1990'S  . LUMBAR LAMINECTOMY/DECOMPRESSION MICRODISCECTOMY  10/29/2011   Procedure: LUMBAR LAMINECTOMY/DECOMPRESSION MICRODISCECTOMY 1 LEVEL;  Surgeon: Mariam Dollar, MD;  Location: [facility];  Service: Neurosurgery;  Laterality: Right;  Right Lumbar Three-Four Lumbar Laminectomy/Microdiscectomy  . MAXIMUM ACCESS (MAS)POSTERIOR LUMBAR INTERBODY FUSION (PLIF) 1 LEVEL N/A 07/22/2014   Procedure: FOR MAXIMUM ACCESS (MAS) POSTERIOR LUMBAR INTERBODY FUSION (PLIF) 1 LEVEL;  Surgeon: Jillyn Hidden  Wynetta Emery, MD;  Location: [facility];  Service: Neurosurgery;  Laterality: N/A;  FOR MAXIMUM ACCESS (MAS) POSTERIOR LUMBAR INTERBODY FUSION (PLIF) 1 LEVEL L4-5  . NEUROPLASTY / TRANSPOSITION ULNAR NERVE AT ELBOW Left 07/23/2008  . POSTERIOR CERVICAL FUSION/FORAMINOTOMY  06/09/2011   Procedure: POSTERIOR CERVICAL FUSION/FORAMINOTOMY LEVEL 4;  Surgeon: Mariam Dollar, MD;  Location: [facility];  Service: Neurosurgery;  Laterality: N/A;  Cervical three to seven  posterior cervical fusion, Cervical four to seven  Laminectomy, bone graft from right iliac crest   . POSTERIOR LUMBAR LAMINECTOMY AND DISKECTOMY  10/15/2009   L3 -- L4  . TONSILLECTOMY AND ADENOIDECTOMY  child  . TRANSTHORACIC ECHOCARDIOGRAM  11/27/2013   grade 1 diastolic dysfunction, ef 50-55%/  trivial AR and PR/ mild MR and TR/  PASP  . TRANSURETHRAL RESECTION OF PROSTATE N/A 05/31/2016   Procedure: TRANSURETHRAL RESECTION OF THE PROSTATE (TURP);  Surgeon: Jethro Bolus, MD;  Location: [facility];  Service: Urology;  Laterality: N/A;    Medication: Current Facility-Administered Medications  Medication Dose Route Frequency Provider Last Rate Last Dose  . iopamidol (ISOVUE-300) 61 % injection           . sodium chloride 0.9 % injection            Current Outpatient Medications  Medication Sig Dispense Refill  . albuterol (PROVENTIL HFA;VENTOLIN HFA) 108 (90 Base) MCG/ACT inhaler Inhale 2 puffs into the lungs every 6 (six) hours as needed for wheezing or shortness of breath. 1 Inhaler 2  . atorvastatin (LIPITOR) 20 MG tablet TAKE 1 TABLET BY MOUTH DAILY (Patient taking differently: TAKE 20MG  BY MOUTH DAILY) 90 tablet 0  . azithromycin (ZITHROMAX) 250 MG tablet Take 2 today, then 1 daily until gone. (Patient taking differently: Take 250-500 mg by mouth daily. Take 2 today, then 1 daily until gone.) 6 tablet 0  . carbidopa-levodopa (SINEMET IR) 25-100 MG tablet Take 1 tablet by mouth 3 (three) times daily. 90 tablet 5  . colchicine 0.6 MG tablet TAKE 1 TABLET BY MOUTH EVERY 2 HOURS UNTIL RELIEF as needed (Patient taking differently: Take 0.6 mg by mouth every 2 (two) hours as needed (For gout.). ) 30 tablet 0  . diazepam (VALIUM) 5 MG tablet Take 2.5-5 mg by mouth daily as needed (For neck pain.).     Marland Kitchen diclofenac sodium (VOLTAREN) 1 % GEL Apply 2 g 4 (four) times daily topically. (Patient taking differently: Apply 2 g topically 4 (four) times daily as needed (For pain.). ) 1 Tube 2  . febuxostat (ULORIC) 40 MG  tablet Take 1 tablet (40 mg total) by mouth daily as needed. 30 tablet 2  . Fluticasone-Umeclidin-Vilant (TRELEGY ELLIPTA) 100-62.5-25 MCG/INH AEPB Inhale 1 puff into the lungs daily. 1 each 0  . ipratropium-albuterol (DUONEB) 0.5-2.5 (3) MG/3ML SOLN Take 3 mLs by nebulization every 6 (six) hours as needed. (Patient taking differently: Take 3 mLs by nebulization every 6 (six) hours as needed (For wheezing or shortness of breath.). ) 360 mL 2  . Multiple Vitamins-Minerals (CENTRUM SILVER ADULT 50+ PO) Take 1 tablet by mouth daily.    . niacin 500 MG tablet Take 500 mg by mouth every morning.     . Omega-3 Fatty Acids (FISH OIL) 1000 MG CAPS Take 2,000 mg by mouth every morning.     Marland Kitchen omeprazole (PRILOSEC) 20 MG capsule Take 1 capsule (20 mg total) by mouth every morning. 90 capsule 1  . Polyethyl Glycol-Propyl Glycol (SYSTANE OP) Place  2 drops into both eyes at bedtime as needed (For dry eyes.).     Marland Kitchen predniSONE (DELTASONE) 10 MG tablet 40mg X3 days, 30mg  X3 days, 20mg  X3 days, 10mg X3 days, then stop. (Patient taking differently: Take 10-40 mg by mouth See admin instructions. 40mg X3 days, 30mg  X3 days, 20mg  X3 days, 10mg X3 days, then stop.) 30 tablet 0  . Probiotic Product (PROBIOTIC PO) Take 1 capsule by mouth every morning.     . sertraline (ZOLOFT) 25 MG tablet TAKE 1 TABLET BY MOUTH DAILY (Patient taking differently: TAKE 25MG  BY MOUTH DAILY) 90 tablet 0  . tamsulosin (FLOMAX) 0.4 MG CAPS capsule TAKE 2 CAPSULES BY MOUTH EVERY MORNING (Patient taking differently: TAKE 2 CAPSULES (0.8MG ) BY MOUTH EVERY MORNING) 180 capsule 0  . trimethoprim (TRIMPEX) 100 MG tablet Take 100 mg by mouth daily.       Allergies: Allergies  Allergen Reactions  . Hydromorphone Itching  . Oxycodone Itching and Nausea Only    Social History: Social History   Tobacco Use  . Smoking status: Former Smoker    Packs/day: 1.50    Years: 55.00    Pack years: 82.50    Types: Cigarettes    Last attempt to quit:  05/27/2013    Years since quitting: 4.1  . Smokeless tobacco: Current User    Types: Chew  . Tobacco comment: occasional chew tobacco  Substance Use Topics  . Alcohol use: No    Alcohol/week: 0.0 oz  . Drug use: No    Family History Family History  Problem Relation Age of Onset  . Diabetes Mother   . Parkinson's disease Father   . Heart disease Father     Review of Systems 10 systems were reviewed and are negative except as noted specifically in the HPI.  Objective   Vital signs in last 24 hours: BP (!) 153/84   Pulse 79   Resp 18   SpO2 93%   Physical Exam General: NAD, A&O, resting, appropriate.  Appears older than stated age HEENT: [place]/AT, EOMI, MMM Pulmonary: Normal work of breathing Cardiovascular: HDS, adequate peripheral perfusion Abdomen: Soft, NTTP, nondistended GU: No CVA tenderness Extremities: warm and well perfused Neuro: Appropriate, no focal neurological deficits  Most Recent Labs: Lab Results  Component Value Date   WBC 16.0 (H) 07/15/2017   HGB 13.8 07/15/2017   HCT 41.2 07/15/2017   PLT 256 07/15/2017    Lab Results  Component Value Date   NA 139 07/15/2017   K 4.2 07/15/2017   CL 104 07/15/2017   CO2 22 07/15/2017   BUN 27 (H) 07/15/2017   CREATININE 0.91 07/15/2017   CALCIUM 9.2 07/15/2017    Lab Results  Component Value Date   INR 1.03 12/07/2015   APTT 29 12/07/2015     IMAGING: Ct Abdomen Pelvis W Contrast  Result Date: 07/15/2017 CLINICAL DATA:  Acute right flank pain. EXAM: CT ABDOMEN AND PELVIS WITH CONTRAST TECHNIQUE: Multidetector CT imaging of the abdomen and pelvis was performed using the standard protocol following bolus administration of intravenous contrast. CONTRAST:  ISOVUE-300 IOPAMIDOL (ISOVUE-300) INJECTION 61% COMPARISON:  04/15/2015 FINDINGS: Lower chest: Bibasilar scarring changes and mild bronchiectasis. No acute pulmonary findings or worrisome pulmonary lesions. The heart is upper limits of normal in  size. No pericardial effusion. Coronary artery calcifications are noted along with moderate aortic calcifications. The distal esophagus is grossly normal. There is a small hiatal hernia. Hepatobiliary: No focal hepatic lesions. The gallbladder is surgically absent. Mild associated intra and  extrahepatic biliary dilatation. Pancreas: Advanced fatty atrophy. No mass, inflammation or ductal dilatation. Stable moderate-sized duodenum diverticulum near the pancreatic head. Spleen: Normal size.  No focal lesions. Adrenals/Urinary Tract: The adrenal glands are normal. High-grade obstructive findings involving the right kidney with right-sided hydroureteronephrosis down to an obstructing 5 mm right UVJ calculus. Marked perinephric interstitial changes and fluid. There are multiple right-sided renal calculi and a small midpole left renal calculus. There are bilateral cysts. Moderate areas of renal scarring involving the right kidney. The bladder is unremarkable. Stomach/Bowel: The stomach, duodenum, small bowel and colon are grossly normal without oral contrast. No acute inflammatory changes, mass lesions or obstructive findings. Fairly extensive descending colon diverticulosis but no findings for acute diverticulitis. Vascular/Lymphatic: Advanced atherosclerotic calcifications involving the aorta and branch vessels. No mesenteric or retroperitoneal mass or adenopathy. Reproductive: The prostate gland and seminal vesicles are unremarkable. Other: No pelvic mass or adenopathy. No inguinal mass or adenopathy. Small left inguinal hernia containing fat is noted. Musculoskeletal: No significant bony findings. Surgical changes involving the lumbar spine are noted. IMPRESSION: 1. 5 mm right UVJ calculus causing a high-grade obstruction. 2. Bilateral renal calculi. Electronically Signed   By: Rudie MeyerP.  Gallerani M.D.   On: 07/15/2017 12:28    ------  Assessment:  77 y.o. male with COPD, BPH on CIC, presenting with a 5 mm right UVJ  stone with proximal hydro-, as well assome other non-obstructing right renal stones.  Vitals normal.  WBC 16.0k, Cr normal.  UA predictably dirty with positive leuk esterase, WBC, and RBC.  Nitrite negative, rare bacteria.  On trimethoprim daily.  Urine culture pending.   Discussed the etiology, natural history, treatment options for his stones.  Discussed given his COPD, he is a poor surgical candidate, and we would prefer medical expulsive therapy to surgical intervention at this point.    He was given strict return instructions, including fever, persistent nausea/vomiting, or worsening pain.  If he has any issues, he and his wife verbalized understanding that they should come to the ED immediately.   Recommendations: -Okay for discharge from a urologic standpoint -Please discharged with pain medication, Flomax, antibiotics, and antiemetics. -Urology will set up follow-up in 1 week to ensure stone passage or arrange definitive stone therapy -Patient was given strict return precautions.  He will call with any issues, or come straight to the ED.   Thank you for this consult. Please contact the urology consult pager with any further questions/concerns.

## 2017-07-15 NOTE — Anesthesia Procedure Notes (Signed)
Procedure Name: Intubation Date/Time: 07/15/2017 10:01 PM Performed by: Wynonia SoursWalker, Tryson Lumley L, CRNA Pre-anesthesia Checklist: Patient identified, Emergency Drugs available, Suction available, Patient being monitored and Timeout performed Patient Re-evaluated:Patient Re-evaluated prior to induction Oxygen Delivery Method: Circle system utilized Preoxygenation: Pre-oxygenation with 100% oxygen Induction Type: IV induction Ventilation: Mask ventilation without difficulty Laryngoscope Size: 3 and Glidescope Grade View: Grade I Tube type: Subglottic suction tube Tube size: 7.5 mm Number of attempts: 2 Airway Equipment and Method: Stylet,  Video-laryngoscopy and Bougie stylet Placement Confirmation: ETT inserted through vocal cords under direct vision,  positive ETCO2,  CO2 detector and breath sounds checked- equal and bilateral Secured at: 23 cm Tube secured with: Tape Dental Injury: Teeth and Oropharynx as per pre-operative assessment

## 2017-07-15 NOTE — H&P (Signed)
PULMONARY / CRITICAL CARE MEDICINE   Name: Evan [name] MRN: [id number] DOB: 1940/05/10    ADMISSION DATE:  07/15/2017 CONSULTATION DATE:  07/15/2017  REFERRING MD:  Dr. Roger Shelter   CHIEF COMPLAINT:  Urosepsis   HISTORY OF PRESENT ILLNESS:   77 year old male with PMH of BPH s/p TURP, Anxiety/Depression, Cervical Spondylosis, Chronic Pain, Chronic Urinary Rentention (Self Caths at night), COPD, GERD, Diastolic HF    Presented to [facility] ED on 3/29 with severe right sided flank pain. CT A/P with 5 mm right distal urethral stone with proximal hydronephrosis. U/A positive, WBC 16.0. Vitals WNL. Pain resolved. Urology okay with discharge with follow up appointment. Shortly later patient presented to [facility] ED after becoming lightheaded and short of breath. LA  4.39, Given Vancomycin and Zosyn with 30 cc/kg LR bolus. Now hypotensive and requiring Neo gtt. Transferring to [facility] for stent placement.    PAST MEDICAL HISTORY :  He  has a past medical history of Abnormality of gait, Anxiety, Bilateral leg weakness, BPH (benign prostatic hyperplasia), Cervical spondylosis without myelopathy, Chronic back pain, Chronic gout, Depression, Diverticulosis of colon, Emphysema/COPD (HCC), Full dentures, GERD (gastroesophageal reflux disease), Hard of hearing, Hemorrhoids, Hyperlipidemia, Lower urinary tract symptoms (LUTS), Numbness, OA (osteoarthritis), Self-catheterizes urinary bladder, and Wears glasses.  PAST SURGICAL HISTORY: He  has a past surgical history that includes Cardiovascular stress test (05/27/1998); Posterior cervical fusion/foraminotomy (06/09/2011); Lumbar laminectomy/decompression microdiscectomy (10/29/2011); Esophagogastroduodenoscopy; Maximum access (mas)posterior lumbar interbody fusion (plif) 1 level (N/A, 07/22/2014); Carpal tunnel release (Left, 2003 approx.); Colonoscopy with propofol (N/A, 02/10/2016); Neuroplasty / transposition ulnar nerve at elbow (Left, 07/23/2008); Cervical fusion (1995);  Anterior cervical decomp/discectomy fusion (07/03/2001); EXPLORATION AND RE-DO CERVICAL FUSION C4--5 AND ANTERIOR CERVIAL DISKECTOMY FUSION C3--4 (08/09/2007); POSTERIOR LUMBAR LAMINECTOMY AND DISKECTOMY (10/15/2009); Blepharoplasty (2008); CYSTO/  LEFT RETROGRADE PYELOGRAM/  BALLOON DILATION LEFT URETER/  URETEROSCOPY/  STENT PLACEMENT (07/28/2000); transthoracic echocardiogram (11/27/2013); Laparoscopic cholecystectomy (1990'S); Abdominal hernia repair (1970's); Tonsillectomy and adenoidectomy (child); and Transurethral resection of prostate (N/A, 05/31/2016).  Allergies  Allergen Reactions  . Hydromorphone Itching  . Oxycodone Itching and Nausea Only    No current facility-administered medications on file prior to encounter.    Current Outpatient Medications on File Prior to Encounter  Medication Sig  . albuterol (PROVENTIL HFA;VENTOLIN HFA) 108 (90 Base) MCG/ACT inhaler Inhale 2 puffs into the lungs every 6 (six) hours as needed for wheezing or shortness of breath.  Marland Kitchen atorvastatin (LIPITOR) 20 MG tablet TAKE 1 TABLET BY MOUTH DAILY (Patient taking differently: TAKE 20MG  BY MOUTH DAILY)  . azithromycin (ZITHROMAX) 250 MG tablet Take 2 today, then 1 daily until gone. (Patient taking differently: Take 250-500 mg by mouth daily. Take 2 today, then 1 daily until gone.)  . carbidopa-levodopa (SINEMET IR) 25-100 MG tablet Take 1 tablet by mouth 3 (three) times daily.  . cephALEXin (KEFLEX) 500 MG capsule Take 1 capsule (500 mg total) by mouth 4 (four) times daily for 7 days.  . colchicine 0.6 MG tablet TAKE 1 TABLET BY MOUTH EVERY 2 HOURS UNTIL RELIEF as needed (Patient taking differently: Take 0.6 mg by mouth every 2 (two) hours as needed (For gout.). )  . diazepam (VALIUM) 5 MG tablet Take 2.5-5 mg by mouth daily as needed (For neck pain.).   Marland Kitchen diclofenac sodium (VOLTAREN) 1 % GEL Apply 2 g 4 (four) times daily topically. (Patient taking differently: Apply 2 g topically 4 (four) times daily as  needed (For pain.). )  . febuxostat (ULORIC) 40 MG  tablet Take 1 tablet (40 mg total) by mouth daily as needed.  . Fluticasone-Umeclidin-Vilant (TRELEGY ELLIPTA) 100-62.5-25 MCG/INH AEPB Inhale 1 puff into the lungs daily.  Marland Kitchen. ipratropium-albuterol (DUONEB) 0.5-2.5 (3) MG/3ML SOLN Take 3 mLs by nebulization every 6 (six) hours as needed. (Patient taking differently: Take 3 mLs by nebulization every 6 (six) hours as needed (For wheezing or shortness of breath.). )  . morphine (MSIR) 15 MG tablet Take 1 tablet (15 mg total) by mouth every 6 (six) hours as needed for severe pain.  . Multiple Vitamins-Minerals (CENTRUM SILVER ADULT 50+ PO) Take 1 tablet by mouth daily.  . niacin 500 MG tablet Take 500 mg by mouth every morning.   . Omega-3 Fatty Acids (FISH OIL) 1000 MG CAPS Take 2,000 mg by mouth every morning.   Marland Kitchen. omeprazole (PRILOSEC) 20 MG capsule Take 1 capsule (20 mg total) by mouth every morning.  . ondansetron (ZOFRAN ODT) 4 MG disintegrating tablet Take 1 tablet (4 mg total) by mouth every 8 (eight) hours as needed for nausea or vomiting.  Bertram Gala. Polyethyl Glycol-Propyl Glycol (SYSTANE OP) Place 2 drops into both eyes at bedtime as needed (For dry eyes.).   Marland Kitchen. predniSONE (DELTASONE) 10 MG tablet 40mg X3 days, 30mg  X3 days, 20mg  X3 days, 10mg X3 days, then stop. (Patient taking differently: Take 10-40 mg by mouth See admin instructions. 40mg X3 days, 30mg  X3 days, 20mg  X3 days, 10mg X3 days, then stop.)  . Probiotic Product (PROBIOTIC PO) Take 1 capsule by mouth every morning.   . sertraline (ZOLOFT) 25 MG tablet TAKE 1 TABLET BY MOUTH DAILY (Patient taking differently: TAKE 25MG  BY MOUTH DAILY)  . tamsulosin (FLOMAX) 0.4 MG CAPS capsule TAKE 2 CAPSULES BY MOUTH EVERY MORNING (Patient taking differently: TAKE 2 CAPSULES (0.8MG ) BY MOUTH EVERY MORNING)  . trimethoprim (TRIMPEX) 100 MG tablet Take 100 mg by mouth daily.     FAMILY HISTORY:  His indicated that his mother is deceased. He indicated that his  father is deceased.   SOCIAL HISTORY: He  reports that he quit smoking about 4 years ago. His smoking use included cigarettes. He has a 82.50 pack-year smoking history. His smokeless tobacco use includes chew. He reports that he does not drink alcohol or use drugs.  REVIEW OF SYSTEMS:   All negative; except for those that are bolded, which indicate positives.  Constitutional: weight loss, weight gain, night sweats, fevers, chills, fatigue, weakness.  HEENT: headaches, sore throat, sneezing, nasal congestion, post nasal drip, difficulty swallowing, tooth/dental problems, visual complaints, visual changes, ear aches. Neuro: difficulty with speech, weakness, numbness, ataxia. CV:  chest pain, orthopnea, PND, swelling in lower extremities, dizziness, palpitations, syncope.  Resp: cough, hemoptysis, dyspnea, wheezing. GI: heartburn, indigestion, abdominal pain, nausea, vomiting, diarrhea, constipation, change in bowel habits, loss of appetite, hematemesis, melena, hematochezia.  GU: dysuria, change in color of urine, urgency or frequency, flank pain, hematuria. MSK: joint pain or swelling, decreased range of motion. Psych: change in mood or affect, depression, anxiety, suicidal ideations, homicidal ideations. Skin: rash, itching, bruising.   SUBJECTIVE:   VITAL SIGNS: BP 97/70   Pulse (!) 102   Temp 100.1 F (37.8 C) (Oral)   Resp (!) 24   Wt 99.8 kg (220 lb)   SpO2 98%   BMI 35.51 kg/m   HEMODYNAMICS:    VENTILATOR SETTINGS:    INTAKE / OUTPUT: I/O last 3 completed shifts: In: 250 [I.V.:250] Out: -   PHYSICAL EXAMINATION: General:  Obese Adult male, no distress  Neuro:  Alert, oriented, follows commands  HEENT:  Dry MM  Cardiovascular:  Tachy no MRG Lungs:  Diminished breath sounds to bases, no wheeze/crackles  Abdomen:  Distended, soft Musculoskeletal:  +1 BLE  Skin:  Warm, intact   LABS:  BMET Recent Labs  Lab 07/15/17 1043 07/15/17 1758  NA 139 138  K 4.2  4.1  CL 104 106  CO2 22 19*  BUN 27* 27*  CREATININE 0.91 1.33*  GLUCOSE 161* 172*    Electrolytes Recent Labs  Lab 07/15/17 1043 07/15/17 1758  CALCIUM 9.2 8.6*    CBC Recent Labs  Lab 07/12/17 1007 07/15/17 1043 07/15/17 1758  WBC 8.3 16.0* 16.2*  HGB 12.0* 13.8 12.4*  HCT 35.1* 41.2 38.1*  PLT 223.0 256 219    Coag's Recent Labs  Lab 07/15/17 1758  INR 1.11    Sepsis Markers Recent Labs  Lab 07/15/17 1806  LATICACIDVEN 4.39*    ABG No results for input(s): PHART, PCO2ART, PO2ART in the last 168 hours.  Liver Enzymes Recent Labs  Lab 07/15/17 1043 07/15/17 1758  AST 30 56*  ALT 23 36  ALKPHOS 108 112  BILITOT 0.8 0.7  ALBUMIN 3.8 3.0*    Cardiac Enzymes No results for input(s): TROPONINI, PROBNP in the last 168 hours.  Glucose No results for input(s): GLUCAP in the last 168 hours.  Imaging Ct Abdomen Pelvis W Contrast  Result Date: 07/15/2017 CLINICAL DATA:  Acute right flank pain. EXAM: CT ABDOMEN AND PELVIS WITH CONTRAST TECHNIQUE: Multidetector CT imaging of the abdomen and pelvis was performed using the standard protocol following bolus administration of intravenous contrast. CONTRAST:  ISOVUE-300 IOPAMIDOL (ISOVUE-300) INJECTION 61% COMPARISON:  04/15/2015 FINDINGS: Lower chest: Bibasilar scarring changes and mild bronchiectasis. No acute pulmonary findings or worrisome pulmonary lesions. The heart is upper limits of normal in size. No pericardial effusion. Coronary artery calcifications are noted along with moderate aortic calcifications. The distal esophagus is grossly normal. There is a small hiatal hernia. Hepatobiliary: No focal hepatic lesions. The gallbladder is surgically absent. Mild associated intra and extrahepatic biliary dilatation. Pancreas: Advanced fatty atrophy. No mass, inflammation or ductal dilatation. Stable moderate-sized duodenum diverticulum near the pancreatic head. Spleen: Normal size.  No focal lesions.  Adrenals/Urinary Tract: The adrenal glands are normal. High-grade obstructive findings involving the right kidney with right-sided hydroureteronephrosis down to an obstructing 5 mm right UVJ calculus. Marked perinephric interstitial changes and fluid. There are multiple right-sided renal calculi and a small midpole left renal calculus. There are bilateral cysts. Moderate areas of renal scarring involving the right kidney. The bladder is unremarkable. Stomach/Bowel: The stomach, duodenum, small bowel and colon are grossly normal without oral contrast. No acute inflammatory changes, mass lesions or obstructive findings. Fairly extensive descending colon diverticulosis but no findings for acute diverticulitis. Vascular/Lymphatic: Advanced atherosclerotic calcifications involving the aorta and branch vessels. No mesenteric or retroperitoneal mass or adenopathy. Reproductive: The prostate gland and seminal vesicles are unremarkable. Other: No pelvic mass or adenopathy. No inguinal mass or adenopathy. Small left inguinal hernia containing fat is noted. Musculoskeletal: No significant bony findings. Surgical changes involving the lumbar spine are noted. IMPRESSION: 1. 5 mm right UVJ calculus causing a high-grade obstruction. 2. Bilateral renal calculi. Electronically Signed   By: Rudie Meyer M.D.   On: 07/15/2017 12:28   Dg Chest Portable 1 View  Result Date: 07/15/2017 CLINICAL DATA:  Short of breath EXAM: PORTABLE CHEST 1 VIEW COMPARISON:  07/12/2017 CT chest 02/08/2017 FINDINGS: Partially visualized hardware in the  cervical spine. Atelectasis and or scarring at the left base and lingula. No acute consolidation or effusion. Stable cardiomediastinal silhouette with aortic atherosclerosis. Emphysema. IMPRESSION: 1. Emphysematous disease. Linear scarring and/or atelectasis at the left base and lingula Electronically Signed   By: Jasmine Pang M.D.   On: 07/15/2017 18:33     STUDIES:  CT A/P 3/29 > 5 mm right UVJ  calculus causing a high-grade obstruction. Bilateral renal calculi CXR 3/29 > Emphysematous disease. Linear scarring and/or atelectasis at the left base and lingula  CULTURES: Blood 3/29 >> Urine 3/29 >>  Sputum 3/29 >>   ANTIBIOTICS: Zosyn 3/29  Vancomycin 3/29 >> Cefepime 3/29 >> Azithromycin 3/29 >>   SIGNIFICANT EVENTS: 3/29 > Presents to ED   LINES/TUBES: PIV  DISCUSSION: 77 year old male presents to ED on 3/29 with right sided flank pain found to have Urosepsis secondary to 5 mm right UVJ causing high grade obstruction with hydronephrosis. Being transferred to [facility] for stent placement.   ASSESSMENT / PLAN:  PULMONARY A: Acute Hypoxic Respiratory Failure, Atelectasis to Left Base and Lingula  H/O Chronic Bronchitis, 150 pack year smoking history (Quiet 2016), Sub Cm pulmonary nodules  PFTs 05/31/17 FVC 2.73 [77%], FEV1 2.08 [82%], F/F 76, TLC 84%, DLCO 54% Mild obstruction, moderate diffusion defect P:   Wean Oxygen to Maintain Saturation >92 Duoneb PRN  Pulmonary Hygiene   Recent Steroid Taper Started Tuesday, on Day 2 on 30 mg tomorrow AM   CARDIOVASCULAR A:  Septic Shock  Diastolic HF (EF 50-55)  P:  Cardiac Monitoring Wean Neo gtt to Maintain MAP >65  RENAL A:   Urosepsis in setting of UVJ Stone with Hydronephrosis  CT A/P with High-Grade Obstructive involving right kidney with Hydroureteronephrosis with 5 mm right UVJ Calculus  Lactic Acidosis s/p 3L LR H/O BPH s/p TURP P:   Urology Following > Taking to OR upon arrival to [facility]  Trend BMP Replace electrolytes as indicated NS @ 100 ml/hr  Continue Flomax   GASTROINTESTINAL A:   GERD Nausea/Vomiting  P:   NPO PPI Zofran PRN   HEMATOLOGIC A:  VTE Prophylaxis  P:  Trend CBC  Heparin SQ  INFECTIOUS A:   Urosepsis in setting of UVJ Stone with Hydronephrosis  +U/A  Concern for CAP given sputum and left base atelectasis  P:   Trend Fever and WBC curve  PAN culture  Vancomycin, Cefepime,  Azithromycin   ENDOCRINE A:   Hyperglycemia (Currently on Steroid Taper)  P:   Trend glucose  SSI   NEUROLOGIC A:   Acute/Chronic Pain  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  Pgr: [phone]  PCCM Pgr: [phone]

## 2017-07-16 ENCOUNTER — Other Ambulatory Visit: Payer: Self-pay

## 2017-07-16 ENCOUNTER — Encounter (HOSPITAL_COMMUNITY): Payer: Self-pay | Admitting: Urology

## 2017-07-16 DIAGNOSIS — N390 Urinary tract infection, site not specified: Secondary | ICD-10-CM

## 2017-07-16 DIAGNOSIS — R0902 Hypoxemia: Secondary | ICD-10-CM

## 2017-07-16 DIAGNOSIS — G9340 Encephalopathy, unspecified: Secondary | ICD-10-CM

## 2017-07-16 DIAGNOSIS — R6521 Severe sepsis with septic shock: Secondary | ICD-10-CM

## 2017-07-16 LAB — BASIC METABOLIC PANEL
Anion gap: 8 (ref 5–15)
BUN: 28 mg/dL — ABNORMAL HIGH (ref 6–20)
CALCIUM: 7.8 mg/dL — AB (ref 8.9–10.3)
CO2: 21 mmol/L — ABNORMAL LOW (ref 22–32)
Chloride: 111 mmol/L (ref 101–111)
Creatinine, Ser: 1.16 mg/dL (ref 0.61–1.24)
GFR calc Af Amer: >60 mL/min (ref >60)
GFR, EST NON AFRICAN AMERICAN: 59 mL/min — AB (ref >60)
GLUCOSE: 132 mg/dL — AB (ref 65–99)
Potassium: 4 mmol/L (ref 3.5–5.1)
SODIUM: 140 mmol/L (ref 135–145)

## 2017-07-16 LAB — GLUCOSE, CAPILLARY
GLUCOSE-CAPILLARY: 225 mg/dL — AB (ref 65–99)
Glucose-Capillary: 124 mg/dL — ABNORMAL HIGH (ref 65–99)
Glucose-Capillary: 109 mg/dL — ABNORMAL HIGH (ref 65–99)

## 2017-07-16 LAB — CBC
HCT: 33.5 % — ABNORMAL LOW (ref 39.0–52.0)
Hemoglobin: 10.8 g/dL — ABNORMAL LOW (ref 13.0–17.0)
MCH: 30.5 pg (ref 26.0–34.0)
MCHC: 32.2 g/dL (ref 30.0–36.0)
MCV: 94.6 fL (ref 78.0–100.0)
Platelets: 212 10*3/uL (ref 150–400)
RBC: 3.54 MIL/uL — ABNORMAL LOW (ref 4.22–5.81)
RDW: 14.7 % (ref 11.5–15.5)
WBC: 31.1 10*3/uL — AB (ref 4.0–10.5)

## 2017-07-16 LAB — MAGNESIUM: MAGNESIUM: 1.6 mg/dL — AB (ref 1.7–2.4)

## 2017-07-16 LAB — EXPECTORATED SPUTUM ASSESSMENT W GRAM STAIN, RFLX TO RESP C

## 2017-07-16 LAB — LACTIC ACID, PLASMA
LACTIC ACID, VENOUS: 2.3 mmol/L — AB (ref 0.5–1.9)
LACTIC ACID, VENOUS: 1 mmol/L (ref 0.5–1.9)

## 2017-07-16 LAB — PHOSPHORUS: PHOSPHORUS: 3.4 mg/dL (ref 2.5–4.6)

## 2017-07-16 LAB — MRSA PCR SCREENING: MRSA BY PCR: NEGATIVE

## 2017-07-16 LAB — EXPECTORATED SPUTUM ASSESSMENT W REFEX TO RESP CULTURE

## 2017-07-16 MED ORDER — MAGNESIUM SULFATE 2 GM/50ML IV SOLN
2.0000 g | Freq: Once | INTRAVENOUS | Status: AC
  Administered 2017-07-16: 2 g via INTRAVENOUS
  Filled 2017-07-16: qty 50

## 2017-07-16 MED ORDER — SUGAMMADEX SODIUM 500 MG/5ML IV SOLN
INTRAVENOUS | Status: AC
  Filled 2017-07-16: qty 5

## 2017-07-16 MED ORDER — ONDANSETRON HCL 4 MG/2ML IJ SOLN
INTRAMUSCULAR | Status: AC
  Filled 2017-07-16: qty 2

## 2017-07-16 MED ORDER — ETOMIDATE 2 MG/ML IV SOLN
INTRAVENOUS | Status: AC
  Filled 2017-07-16: qty 10

## 2017-07-16 MED ORDER — PHENYLEPHRINE HCL-NACL 10-0.9 MG/250ML-% IV SOLN
0.0000 ug/min | INTRAVENOUS | Status: DC
  Filled 2017-07-16: qty 250

## 2017-07-16 MED ORDER — VANCOMYCIN HCL IN DEXTROSE 1-5 GM/200ML-% IV SOLN
1000.0000 mg | INTRAVENOUS | Status: DC
  Administered 2017-07-17 – 2017-07-18 (×2): 1000 mg via INTRAVENOUS
  Filled 2017-07-16 (×2): qty 200

## 2017-07-16 MED ORDER — INDIGOTINDISULFONATE SODIUM 8 MG/ML IJ SOLN
INTRAMUSCULAR | Status: DC | PRN
  Administered 2017-07-15: 5 mL via INTRAVENOUS

## 2017-07-16 MED ORDER — MAGNESIUM SULFATE 2 GM/50ML IV SOLN
2.0000 g | Freq: Once | INTRAVENOUS | Status: AC
Start: 2017-07-16 — End: 2017-07-16
  Administered 2017-07-16: 2 g via INTRAVENOUS
  Filled 2017-07-16: qty 50

## 2017-07-16 MED ORDER — PHENYLEPHRINE HCL-NACL 10-0.9 MG/250ML-% IV SOLN
0.0000 ug/min | INTRAVENOUS | Status: DC
  Administered 2017-07-16: 26 ug/min via INTRAVENOUS
  Filled 2017-07-16: qty 250

## 2017-07-16 MED ORDER — ROCURONIUM BROMIDE 10 MG/ML (PF) SYRINGE
PREFILLED_SYRINGE | INTRAVENOUS | Status: AC
  Filled 2017-07-16: qty 5

## 2017-07-16 MED ORDER — MAGNESIUM SULFATE 2 GM/50ML IV SOLN: 2.0000 g | Freq: Once | INTRAVENOUS | Status: DC

## 2017-07-16 NOTE — Anesthesia Postprocedure Evaluation (Signed)
Anesthesia Post Note  Patient: [name]  Procedure(s) Performed: CYSTOSCOPY WITH RETROGRADE PYELOGRAM/URETERAL STENT PLACEMENT (Right )     Patient location during evaluation: PACU Anesthesia Type: General Level of consciousness: awake and alert, oriented and patient cooperative Pain management: pain level controlled Vital Signs Assessment: post-procedure vital signs reviewed and stable Respiratory status: spontaneous breathing, nonlabored ventilation, respiratory function stable and patient connected to nasal cannula oxygen Cardiovascular status: blood pressure returned to baseline and stable Postop Assessment: no apparent nausea or vomiting Anesthetic complications: no    Last Vitals:  Vitals:   07/15/17 2337 07/16/17 0000  BP: (!) 94/54   Pulse: 88   Resp: (!) 24   Temp:  37.1 C  SpO2: 98%     Last Pain:  Vitals:   07/16/17 0000  TempSrc:   PainSc: 0-No pain                 Breawna Montenegro,E. Lyonel Morejon

## 2017-07-16 NOTE — Progress Notes (Addendum)
Pharmacy Antibiotic Note  Evan HillockRichard A [name] is a 77 y.o. male presented to the ED on 07/15/2017 with right sided flank pain and right distal ureteral stone.  He had pyelogram/ureteral stent placement on 3/29.  Vancomycin and cefepime started on admission for broad coverage for urosepsis.  Today, 07/16/2017: - day #1 abx - Tmax 100.1, wbc elevated -  scr down 1.16 (crcl ~55N)   Plan: - adjust vancomycin dose to 1000 mg IV q24h for est AUC 438 (goal 400-500) - continue cefepime 2gm IV q12h - f/u cultures and renal function  - patient was on azithromycin (z-pak) PTA-- started on 3/26 x5 day course. Consider d/c azithromycin after dose on 3/30 __________________________________________  Height: 5\' 6"  (167.6 cm) Weight: 225 lb 12 oz (102.4 kg) IBW/kg (Calculated) : 63.8  Temp (24hrs), Avg:98.9 F (37.2 C), Min:97.5 F (36.4 C), Max:100.1 F (37.8 C)  Recent Labs  Lab 07/12/17 1007 07/15/17 1043 07/15/17 1758 07/15/17 1806 07/15/17 2013 07/16/17 0206 07/16/17 0556  WBC 8.3 16.0* 16.2*  --   --  31.1*  --   CREATININE  --  0.91 1.33*  --   --  1.16  --   LATICACIDVEN  --   --   --  4.39* 4.8* 2.3* 1.0    Estimated Creatinine Clearance: 60.7 mL/min (by C-G formula based on SCr of 1.16 mg/dL).    Allergies  Allergen Reactions  . Hydromorphone Itching  . Oxycodone Itching and Nausea Only    Antimicrobials this admission:  3/29 CTX x1 3/29 zosyn x1 3/29 cefepime>> 3/29 vanc>> 3/26 azithro (on PTA, for 5 days- started on 3/26>>  Dose adjustments this admission:  --  Microbiology results:  3/29 BCx x2:  3/29 at 1114 UCx:  3/29 at 2248 ucx: 3/30 MRSA PCR: neg  Thank you for allowing pharmacy to be a part of this patient's care.  Lucia Gaskinsham, Margaret Staggs P 07/16/2017 8:55 AM

## 2017-07-16 NOTE — Addendum Note (Signed)
Addendum  created 07/16/17 0033 by Wynonia SoursWalker, Lady Wisham L, CRNA   Intraprocedure Flowsheets edited

## 2017-07-16 NOTE — Addendum Note (Signed)
Addendum  created 07/16/17 0608 by Wynonia SoursWalker, Lylie Blacklock L, CRNA   Intraprocedure Event edited

## 2017-07-16 NOTE — Progress Notes (Signed)
PULMONARY / CRITICAL CARE MEDICINE   Name: Evan [name] MRN: [id number] DOB: 11/01/1940    ADMISSION DATE:  07/15/2017 CONSULTATION DATE:  07/15/2017  REFERRING MD:  Dr. Roger Shelter   CHIEF COMPLAINT:  Urosepsis   HISTORY OF PRESENT ILLNESS:   77 year old male with PMH of BPH s/p TURP, Anxiety/Depression, Cervical Spondylosis, Chronic Pain, Chronic Urinary Rentention (Self Caths at night), COPD, GERD, Diastolic HF    Presented to [facility] ED on 3/29 with severe right sided flank pain. CT A/P with 5 mm right distal urethral stone with proximal hydronephrosis. U/A positive, WBC 16.0. Vitals WNL. Pain resolved. Urology okay with discharge with follow up appointment. Shortly later patient presented to [facility] ED after becoming lightheaded and short of breath. LA  4.39, Given Vancomycin and Zosyn with 30 cc/kg LR bolus. Now hypotensive and requiring Neo gtt. Transferring to [facility] for stent placement.    SUBJECTIVE:  No events overnight  VITAL SIGNS: BP (!) 101/40 (BP Location: Right Arm)   Pulse 72   Temp 98.2 F (36.8 C) (Oral)   Resp 18   Ht 5\' 6"  (1.676 m)   Wt 225 lb 12 oz (102.4 kg)   SpO2 95%   BMI 36.44 kg/m   HEMODYNAMICS:    VENTILATOR SETTINGS:    INTAKE / OUTPUT: I/O last 3 completed shifts: In: 6507.7 [I.V.:5557.7; IV Piggyback:950] Out: 1450 [Urine:1450]  PHYSICAL EXAMINATION: General:  Obese male, resting in bed, NAD Neuro:  Alert and interactive, moving all ext to command HEENT:  [place]/AT, PERRL, EOM-I and MMM Cardiovascular:  RRR, Nl S1/S2 and -M/R/G Lungs:  Decreased BS at the bases Abdomen:  Soft, NT, ND and +BS Musculoskeletal:  +1 BLE  Skin:  Warm, intact   LABS:  BMET Recent Labs  Lab 07/15/17 1043 07/15/17 1758 07/15/17 2352 07/16/17 0206  NA 139 138 140 140  K 4.2 4.1 3.9 4.0  CL 104 106  --  111  CO2 22 19*  --  21*  BUN 27* 27*  --  28*  CREATININE 0.91 1.33*  --  1.16  GLUCOSE 161* 172*  --  132*   Electrolytes Recent Labs  Lab  07/15/17 1043 07/15/17 1758 07/16/17 0206  CALCIUM 9.2 8.6* 7.8*  MG  --   --  1.6*  PHOS  --   --  3.4   CBC Recent Labs  Lab 07/15/17 1043 07/15/17 1758 07/15/17 2352 07/16/17 0206  WBC 16.0* 16.2*  --  31.1*  HGB 13.8 12.4* 10.2* 10.8*  HCT 41.2 38.1* 30.0* 33.5*  PLT 256 219  --  212   Coag's Recent Labs  Lab 07/15/17 1758  INR 1.11   Sepsis Markers Recent Labs  Lab 07/15/17 2013 07/16/17 0206 07/16/17 0556  LATICACIDVEN 4.8* 2.3* 1.0  PROCALCITON 5.72  --   --    ABG Recent Labs  Lab 07/15/17 2352  PHART 7.370  PCO2ART 37.8  PO2ART 112.0*   Liver Enzymes Recent Labs  Lab 07/15/17 1043 07/15/17 1758  AST 30 56*  ALT 23 36  ALKPHOS 108 112  BILITOT 0.8 0.7  ALBUMIN 3.8 3.0*   Cardiac Enzymes No results for input(s): TROPONINI, PROBNP in the last 168 hours.  Glucose Recent Labs  Lab 07/15/17 2346 07/16/17 0438 07/16/17 0747  GLUCAP 152* 124* 109*   Imaging Ct Abdomen Pelvis W Contrast  Result Date: 07/15/2017 CLINICAL DATA:  Acute right flank pain. EXAM: CT ABDOMEN AND PELVIS WITH CONTRAST TECHNIQUE: Multidetector CT imaging of the  abdomen and pelvis was performed using the standard protocol following bolus administration of intravenous contrast. CONTRAST:  100mL ISOVUE-300 IOPAMIDOL (ISOVUE-300) INJECTION 61% COMPARISON:  04/15/2015 FINDINGS: Lower chest: Bibasilar scarring changes and mild bronchiectasis. No acute pulmonary findings or worrisome pulmonary lesions. The heart is upper limits of normal in size. No pericardial effusion. Coronary artery calcifications are noted along with moderate aortic calcifications. The distal esophagus is grossly normal. There is a small hiatal hernia. Hepatobiliary: No focal hepatic lesions. The gallbladder is surgically absent. Mild associated intra and extrahepatic biliary dilatation. Pancreas: Advanced fatty atrophy. No mass, inflammation or ductal dilatation. Stable moderate-sized duodenum diverticulum near  the pancreatic head. Spleen: Normal size.  No focal lesions. Adrenals/Urinary Tract: The adrenal glands are normal. High-grade obstructive findings involving the right kidney with right-sided hydroureteronephrosis down to an obstructing 5 mm right UVJ calculus. Marked perinephric interstitial changes and fluid. There are multiple right-sided renal calculi and a small midpole left renal calculus. There are bilateral cysts. Moderate areas of renal scarring involving the right kidney. The bladder is unremarkable. Stomach/Bowel: The stomach, duodenum, small bowel and colon are grossly normal without oral contrast. No acute inflammatory changes, mass lesions or obstructive findings. Fairly extensive descending colon diverticulosis but no findings for acute diverticulitis. Vascular/Lymphatic: Advanced atherosclerotic calcifications involving the aorta and branch vessels. No mesenteric or retroperitoneal mass or adenopathy. Reproductive: The prostate gland and seminal vesicles are unremarkable. Other: No pelvic mass or adenopathy. No inguinal mass or adenopathy. Small left inguinal hernia containing fat is noted. Musculoskeletal: No significant bony findings. Surgical changes involving the lumbar spine are noted. IMPRESSION: 1. 5 mm right UVJ calculus causing a high-grade obstruction. 2. Bilateral renal calculi. Electronically Signed   By: Rudie MeyerP.  Gallerani M.D.   On: 07/15/2017 12:28   Dg Chest Portable 1 View  Result Date: 07/15/2017 CLINICAL DATA:  Short of breath EXAM: PORTABLE CHEST 1 VIEW COMPARISON:  07/12/2017 CT chest 02/08/2017 FINDINGS: Partially visualized hardware in the cervical spine. Atelectasis and or scarring at the left base and lingula. No acute consolidation or effusion. Stable cardiomediastinal silhouette with aortic atherosclerosis. Emphysema. IMPRESSION: 1. Emphysematous disease. Linear scarring and/or atelectasis at the left base and lingula Electronically Signed   By: Jasmine PangKim  Fujinaga M.D.   On:  07/15/2017 18:33   Dg C-arm 1-60 Min-no Report  Result Date: 07/15/2017 Fluoroscopy was utilized by the requesting physician.  No radiographic interpretation.   STUDIES:  CT A/P 3/29 > 5 mm right UVJ calculus causing a high-grade obstruction. Bilateral renal calculi CXR 3/29 > Emphysematous disease. Linear scarring and/or atelectasis at the left base and lingula  CULTURES: Blood 3/29 >> Urine 3/29 >>  Sputum 3/29 >>   ANTIBIOTICS: Zosyn 3/29 off Vancomycin 3/29 >> Cefepime 3/29 >> Azithromycin 3/29 >>   SIGNIFICANT EVENTS: 3/29 > Presents to ED   LINES/TUBES: PIV  DISCUSSION: 77 year old male presents to ED on 3/29 with right sided flank pain found to have Urosepsis secondary to 5 mm right UVJ causing high grade obstruction with hydronephrosis. [facility].   ASSESSMENT / PLAN:  PULMONARY A: Acute Hypoxic Respiratory Failure, Atelectasis to Left Base and Lingula  H/O Chronic Bronchitis, 150 pack year smoking history (Quiet 2016), Sub Cm pulmonary nodules  PFTs 05/31/17 FVC 2.73 [77%], FEV1 2.08 [82%], F/F 76, TLC 84%, DLCO 54% Mild obstruction, moderate diffusion defect P:   Wean Oxygen to Maintain Saturation >92 Duoneb PRN  Pulmonary Hygiene   Recent Steroid Taper Started Tuesday,  on Day 2 on 30 mg today, will decrease to 20 mg after day 3  CARDIOVASCULAR A:  Septic Shock  Diastolic HF (EF 50-55)  P:  Cardiac Monitoring Taper neo for map of 65 mmHg  RENAL A:   Urosepsis in setting of UVJ Stone with Hydronephrosis  CT A/P with High-Grade Obstructive involving right kidney with Hydroureteronephrosis with 5 mm right UVJ Calculus  Lactic Acidosis s/p 3L LR H/O BPH s/p TURP P:   Urology Following > stent per urology Trend BMP Replace electrolytes as indicated KVO IVF Continue Flomax   GASTROINTESTINAL A:   GERD Nausea/Vomiting  P:   NPO PPI Zofran PRN   HEMATOLOGIC A:  VTE Prophylaxis  P:  Trend CBC  Heparin  SQ  INFECTIOUS A:   Urosepsis in setting of UVJ Stone with Hydronephrosis  +U/A  Concern for CAP given sputum and left base atelectasis  P:   Trend Fever and WBC curve  PAN culture  Vancomycin, Cefepime and zithromax  ENDOCRINE A:   Hyperglycemia (Currently on Steroid Taper)  P:   Trend glucose  SSI   NEUROLOGIC A:   Acute/Chronic Pain  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   Critical Care Time devoted to patient care services described in this note is  35  Minutes. This time reflects time of care of this signee Dr Koren Bound. This critical care time does not reflect procedure time, or teaching time or supervisory time of PA/NP/Med student/Med Resident etc but could involve care discussion time.  Alyson Reedy, M.D. [facility] Pulmonary/Critical Care Medicine. Pager: [phone]. After hours pager: [phone].

## 2017-07-16 NOTE — Transfer of Care (Signed)
Immediate Anesthesia Transfer of Care Note  Patient: Evan [name]  Procedure(s) Performed: CYSTOSCOPY WITH RETROGRADE PYELOGRAM/URETERAL STENT PLACEMENT (Right )  Patient Location: PACU  Anesthesia Type:General  Level of Consciousness: awake, oriented and patient cooperative  Airway & Oxygen Therapy: Patient Spontanous Breathing and Patient connected to face mask oxygen  Post-op Assessment: Report given to RN and Post -op Vital signs reviewed and stable  Post vital signs: Reviewed and stable  Last Vitals:  Vitals Value Taken Time  BP 87/52 07/16/2017 12:01 AM  Temp 37.1 C 07/16/2017 12:00 AM  Pulse 85 07/16/2017 12:02 AM  Resp 21 07/16/2017 12:02 AM  SpO2 95 % 07/16/2017 12:02 AM  Vitals shown include unvalidated device data.  Last Pain:  Vitals:   07/16/17 0000  TempSrc:   PainSc: 0-No pain         Complications: No apparent anesthesia complications

## 2017-07-16 NOTE — Progress Notes (Signed)
Patient ID: Evan [name], male   DOB: 01-Dec-1940, 77 y.o.   MRN: [id number]    Assessment: 1.  Obstructing right distal ureteral stone with urosepsis - he is doing much better now with source control.  He is tolerating his stent.  His white count was noted to be 31.1 today but his temperature trend is downward and he is afebrile at this time.  Foley catheter remains indwelling as well to maximize drainage.  He is on appropriate antibiotics.  Cultures remain pending.  2.  Distal right ureteral stone -  he is currently stented.  The stone was very distal but he has some distortion of his anatomy due to the presence of a Hutch diverticulum.  His stone will be addressed as an outpatient once infection has been completely cleared.   Plan: 1.  Continue broad-spectrum IV antibiotics. 2.  Await culture results. 3.  Continue Foley catheter for now.    Subjective: Patient reports feeling much better this morning.  He denied any flank pain or suprapubic discomfort.  He said he was having some mild discomfort in the urethra because of his catheter.  Foley catheter draining  Objective: Vital signs in last 24 hours: Temp:  [97.5 F (36.4 C)-100.1 F (37.8 C)] 98.2 F (36.8 C) (03/30 0800) Pulse Rate:  [63-105] 72 (03/30 0600) Resp:  [15-33] 18 (03/30 0600) BP: (64-184)/(40-86) 101/40 (03/30 0020) SpO2:  [91 %-98 %] 95 % (03/30 0600) Arterial Line BP: (97-119)/(38-49) 111/48 (03/30 0600) Weight:  [99.8 kg (220 lb)-102.4 kg (225 lb 12 oz)] 102.4 kg (225 lb 12 oz) (03/30 0409)A  Intake/Output from previous day: 03/29 0701 - 03/30 0700 In: 6507.7 [I.V.:5557.7; IV Piggyback:950] Out: 1450 [Urine:1450] Intake/Output this shift: Total I/O In: -  Out: 375 [Urine:375]  Past Medical History:  Diagnosis Date  . Abnormality of gait    multiple cervical spondylosis  . Anxiety   . Bilateral leg weakness    due to back surgeries--  mostly uses wheelchair and at home very short distant w/  walker  . BPH (benign prostatic hyperplasia)   . Cervical spondylosis without myelopathy    per dr cram (neuro surg.)  . Chronic back pain   . Chronic gout   . Depression   . Diverticulosis of colon   . Emphysema/COPD (HCC)   . Full dentures   . GERD (gastroesophageal reflux disease)   . Hard of hearing    does not wear hearing aides  . Hemorrhoids   . Hyperlipidemia   . Lower urinary tract symptoms (LUTS)   . Numbness    hands and related to neck  . OA (osteoarthritis)    hands  . Self-catheterizes urinary bladder   . Wears glasses     Physical Exam:  Lungs - Normal respiratory effort, chest expands symmetrically.  Abdomen - Soft, non-tender & non-distended. GU: Foley catheter draining slightly pink urine with no clots.  Lab Results: Recent Labs    07/15/17 1043 07/15/17 1758 07/15/17 2352 07/16/17 0206  WBC 16.0* 16.2*  --  31.1*  HGB 13.8 12.4* 10.2* 10.8*  HCT 41.2 38.1* 30.0* 33.5*   BMET Recent Labs    07/15/17 1758 07/15/17 2352 07/16/17 0206  NA 138 140 140  K 4.1 3.9 4.0  CL 106  --  111  CO2 19*  --  21*  GLUCOSE 172*  --  132*  BUN 27*  --  28*  CREATININE 1.33*  --  1.16  CALCIUM 8.6*  --  7.8*   No results for input(s): LABURIN in the last 72 hours. Results for orders placed or performed during the hospital encounter of 07/15/17  MRSA PCR Screening     Status: None   Collection Time: 07/16/17  1:02 AM  Result Value Ref Range Status   MRSA by PCR NEGATIVE NEGATIVE Final    Comment:        The GeneXpert MRSA Assay (FDA approved for NASAL specimens only), is one component of a comprehensive MRSA colonization surveillance program. It is not intended to diagnose MRSA infection nor to guide or monitor treatment for MRSA infections. Performed at [facility], [address] [place], [place] [id number]     Studies/Results: Ct Abdomen Pelvis W Contrast  Result Date: 07/15/2017 CLINICAL DATA:  Acute right flank pain.  EXAM: CT ABDOMEN AND PELVIS WITH CONTRAST TECHNIQUE: Multidetector CT imaging of the abdomen and pelvis was performed using the standard protocol following bolus administration of intravenous contrast. CONTRAST:  100mL ISOVUE-300 IOPAMIDOL (ISOVUE-300) INJECTION 61% COMPARISON:  04/15/2015 FINDINGS: Lower chest: Bibasilar scarring changes and mild bronchiectasis. No acute pulmonary findings or worrisome pulmonary lesions. The heart is upper limits of normal in size. No pericardial effusion. Coronary artery calcifications are noted along with moderate aortic calcifications. The distal esophagus is grossly normal. There is a small hiatal hernia. Hepatobiliary: No focal hepatic lesions. The gallbladder is surgically absent. Mild associated intra and extrahepatic biliary dilatation. Pancreas: Advanced fatty atrophy. No mass, inflammation or ductal dilatation. Stable moderate-sized duodenum diverticulum near the pancreatic head. Spleen: Normal size.  No focal lesions. Adrenals/Urinary Tract: The adrenal glands are normal. High-grade obstructive findings involving the right kidney with right-sided hydroureteronephrosis down to an obstructing 5 mm right UVJ calculus. Marked perinephric interstitial changes and fluid. There are multiple right-sided renal calculi and a small midpole left renal calculus. There are bilateral cysts. Moderate areas of renal scarring involving the right kidney. The bladder is unremarkable. Stomach/Bowel: The stomach, duodenum, small bowel and colon are grossly normal without oral contrast. No acute inflammatory changes, mass lesions or obstructive findings. Fairly extensive descending colon diverticulosis but no findings for acute diverticulitis. Vascular/Lymphatic: Advanced atherosclerotic calcifications involving the aorta and branch vessels. No mesenteric or retroperitoneal mass or adenopathy. Reproductive: The prostate gland and seminal vesicles are unremarkable. Other: No pelvic mass or  adenopathy. No inguinal mass or adenopathy. Small left inguinal hernia containing fat is noted. Musculoskeletal: No significant bony findings. Surgical changes involving the lumbar spine are noted. IMPRESSION: 1. 5 mm right UVJ calculus causing a high-grade obstruction. 2. Bilateral renal calculi. Electronically Signed   By: Rudie MeyerP.  Gallerani M.D.   On: 07/15/2017 12:28   Dg Chest Portable 1 View  Result Date: 07/15/2017 CLINICAL DATA:  Short of breath EXAM: PORTABLE CHEST 1 VIEW COMPARISON:  07/12/2017 CT chest 02/08/2017 FINDINGS: Partially visualized hardware in the cervical spine. Atelectasis and or scarring at the left base and lingula. No acute consolidation or effusion. Stable cardiomediastinal silhouette with aortic atherosclerosis. Emphysema. IMPRESSION: 1. Emphysematous disease. Linear scarring and/or atelectasis at the left base and lingula Electronically Signed   By: Jasmine PangKim  Fujinaga M.D.   On: 07/15/2017 18:33   Dg C-arm 1-60 Min-no Report  Result Date: 07/15/2017 Fluoroscopy was utilized by the requesting physician.  No radiographic interpretation.      Shenee Wignall C 07/16/2017, 8:26 AM

## 2017-07-17 DIAGNOSIS — E876 Hypokalemia: Secondary | ICD-10-CM

## 2017-07-17 LAB — BASIC METABOLIC PANEL
ANION GAP: 5 (ref 5–15)
BUN: 20 mg/dL (ref 6–20)
CHLORIDE: 112 mmol/L — AB (ref 101–111)
CO2: 24 mmol/L (ref 22–32)
CREATININE: 0.8 mg/dL (ref 0.61–1.24)
Calcium: 8 mg/dL — ABNORMAL LOW (ref 8.9–10.3)
GFR calc non Af Amer: >60 mL/min (ref >60)
Glucose, Bld: 93 mg/dL (ref 65–99)
POTASSIUM: 3.7 mmol/L (ref 3.5–5.1)
Sodium: 141 mmol/L (ref 135–145)

## 2017-07-17 LAB — BLOOD CULTURE ID PANEL (REFLEXED)
ACINETOBACTER BAUMANNII: NOT DETECTED
CANDIDA GLABRATA: NOT DETECTED
CANDIDA KRUSEI: NOT DETECTED
Candida albicans: NOT DETECTED
Candida parapsilosis: NOT DETECTED
Candida tropicalis: NOT DETECTED
ENTEROBACTER CLOACAE COMPLEX: NOT DETECTED
ENTEROBACTERIACEAE SPECIES: NOT DETECTED
ENTEROCOCCUS SPECIES: NOT DETECTED
ESCHERICHIA COLI: NOT DETECTED
Haemophilus influenzae: NOT DETECTED
Klebsiella oxytoca: NOT DETECTED
Klebsiella pneumoniae: NOT DETECTED
LISTERIA MONOCYTOGENES: NOT DETECTED
Methicillin resistance: DETECTED — AB
NEISSERIA MENINGITIDIS: NOT DETECTED
PROTEUS SPECIES: NOT DETECTED
PSEUDOMONAS AERUGINOSA: NOT DETECTED
STAPHYLOCOCCUS SPECIES: DETECTED — AB
STREPTOCOCCUS AGALACTIAE: NOT DETECTED
STREPTOCOCCUS PNEUMONIAE: NOT DETECTED
Serratia marcescens: NOT DETECTED
Staphylococcus aureus (BCID): NOT DETECTED
Streptococcus pyogenes: NOT DETECTED
Streptococcus species: NOT DETECTED

## 2017-07-17 LAB — GLUCOSE, CAPILLARY
GLUCOSE-CAPILLARY: 105 mg/dL — AB (ref 65–99)
GLUCOSE-CAPILLARY: 155 mg/dL — AB (ref 65–99)
GLUCOSE-CAPILLARY: 107 mg/dL — AB (ref 65–99)
GLUCOSE-CAPILLARY: 163 mg/dL — AB (ref 65–99)
Glucose-Capillary: 131 mg/dL — ABNORMAL HIGH (ref 65–99)
Glucose-Capillary: 173 mg/dL — ABNORMAL HIGH (ref 65–99)
Glucose-Capillary: 197 mg/dL — ABNORMAL HIGH (ref 65–99)
Glucose-Capillary: 90 mg/dL (ref 65–99)

## 2017-07-17 LAB — PROCALCITONIN: PROCALCITONIN: 9.69 ng/mL

## 2017-07-17 LAB — CBC
HEMATOCRIT: 31.7 % — AB (ref 39.0–52.0)
HEMOGLOBIN: 10.2 g/dL — AB (ref 13.0–17.0)
MCH: 30.2 pg (ref 26.0–34.0)
MCHC: 32.2 g/dL (ref 30.0–36.0)
MCV: 93.8 fL (ref 78.0–100.0)
Platelets: 162 10*3/uL (ref 150–400)
RBC: 3.38 MIL/uL — AB (ref 4.22–5.81)
RDW: 14.7 % (ref 11.5–15.5)
WBC: 16.5 10*3/uL — ABNORMAL HIGH (ref 4.0–10.5)

## 2017-07-17 LAB — MAGNESIUM: MAGNESIUM: 2.3 mg/dL (ref 1.7–2.4)

## 2017-07-17 LAB — PHOSPHORUS: PHOSPHORUS: 2.7 mg/dL (ref 2.5–4.6)

## 2017-07-17 MED ORDER — CALCIUM CARBONATE ANTACID 500 MG PO CHEW
1.0000 | CHEWABLE_TABLET | Freq: Two times a day (BID) | ORAL | Status: DC | PRN
Start: 2017-07-17 — End: 2017-07-20
  Administered 2017-07-17: 200 mg via ORAL
  Filled 2017-07-17: qty 1

## 2017-07-17 NOTE — Plan of Care (Signed)
77 year old male admitted 07/15/2017 with urosepsis with UVJ stone and hydronephrosis.  CT showed high-grade obstructing stone involving the right kidney with hydronephrosis.  Patient followed by urology.  He was treated with IV antibiotics and pressors currently off pressors.  Plan would be to continue current IV antibiotics and follow-up the final urine and blood cultures.TRH to assume care for4/ 04/2017.

## 2017-07-17 NOTE — Progress Notes (Addendum)
Patient ID: Evan [name], male   DOB: 12-18-1940, 77 y.o.   MRN: [id number]    Assessment:  1.  Obstructing right distal ureteral stone with urosepsis - improved now with source control.  He is tolerating his stent.  Afebrile with downtrending WBC.  Foley catheter remains indwelling as well to maximize drainage.  He is on appropriate antibiotics.  Cultures remain pending (1/2 Bcx positive), though urine culture does not appear to have been sent.   2.  Distal right ureteral stone -  he is currently stented.  The stone was very distal but he has some distortion of his anatomy due to the presence of a Hutch diverticulum.  His stone will be addressed as an outpatient once infection has been completely cleared.   Plan: 1.  Continue broad-spectrum IV antibiotics. 2.  Await culture results. 3.  Foley per primary team at this point. Given his chronic urinary retention, if Foley removed, will need to be placed back on his home BID I+O catheterization regimen.    Subjective: Patient reports feeling much better this morning.  Complains of some right hip pain, likely related to positioning from surgery.   Objective: Vital signs in last 24 hours: Temp:  [97.6 F (36.4 C)-98.7 F (37.1 C)] 98.7 F (37.1 C) (03/31 0401) Pulse Rate:  [41-92] 65 (03/31 0700) Resp:  [15-33] 15 (03/31 0700) BP: (87-137)/(43-78) 135/51 (03/31 0700) SpO2:  [91 %-96 %] 95 % (03/31 0700) Arterial Line BP: (115-127)/(57-66) 121/66 (03/30 1200) Weight:  [104.6 kg (230 lb 9.6 oz)] 104.6 kg (230 lb 9.6 oz) (03/31 0300)A  Intake/Output from previous day: 03/30 0701 - 03/31 0700 In: 1217 [P.O.:240; I.V.:527; IV Piggyback:450] Out: 2000 [Urine:2000] Intake/Output this shift: Total I/O In: 260 [P.O.:240; I.V.:20] Out: -   Past Medical History:  Diagnosis Date  . Abnormality of gait    multiple cervical spondylosis  . Anxiety   . Bilateral leg weakness    due to back surgeries--  mostly uses wheelchair and  at home very short distant w/ walker  . BPH (benign prostatic hyperplasia)   . Cervical spondylosis without myelopathy    per dr cram (neuro surg.)  . Chronic back pain   . Chronic gout   . Depression   . Diverticulosis of colon   . Emphysema/COPD (HCC)   . Full dentures   . GERD (gastroesophageal reflux disease)   . Hard of hearing    does not wear hearing aides  . Hemorrhoids   . Hyperlipidemia   . Lower urinary tract symptoms (LUTS)   . Numbness    hands and related to neck  . OA (osteoarthritis)    hands  . Self-catheterizes urinary bladder   . Wears glasses     Physical Exam:  Lungs - Normal respiratory effort, chest expands symmetrically.  Abdomen - Soft, non-tender & non-distended. GU: Foley catheter draining slightly pink urine with no clots.  Lab Results: Recent Labs    07/15/17 1758 07/15/17 2352 07/16/17 0206 07/17/17 0332  WBC 16.2*  --  31.1* 16.5*  HGB 12.4* 10.2* 10.8* 10.2*  HCT 38.1* 30.0* 33.5* 31.7*   BMET Recent Labs    07/16/17 0206 07/17/17 0332  NA 140 141  K 4.0 3.7  CL 111 112*  CO2 21* 24  GLUCOSE 132* 93  BUN 28* 20  CREATININE 1.16 0.80  CALCIUM 7.8* 8.0*   No results for input(s): LABURIN in the last 72 hours. Results for orders placed or performed during the  hospital encounter of 07/15/17  Culture, blood (Routine x 2)     Status: None (Preliminary result)   Collection Time: 07/15/17  6:00 PM  Result Value Ref Range Status   Specimen Description BLOOD LEFT HAND  Final   Special Requests   Final    BOTTLES DRAWN AEROBIC AND ANAEROBIC Blood Culture adequate volume   Culture   Final    NO GROWTH < 24 HOURS Performed at [facility], [address] [place], [place] [address]    Report Status PENDING  Incomplete  Culture, blood (Routine x 2)     Status: None (Preliminary result)   Collection Time: 07/15/17  6:10 PM  Result Value Ref Range Status   Specimen Description BLOOD LEFT ANTECUBITAL  Final   Special Requests    Final    BOTTLES DRAWN AEROBIC AND ANAEROBIC Blood Culture adequate volume   Culture  Setup Time   Final    GRAM POSITIVE COCCI IN CLUSTERS ANAEROBIC BOTTLE ONLY Organism ID to follow Performed at [facility], [address], [place], [place] [address]    Culture GRAM POSITIVE COCCI  Final   Report Status PENDING  Incomplete  MRSA PCR Screening     Status: None   Collection Time: 07/16/17  1:02 AM  Result Value Ref Range Status   MRSA by PCR NEGATIVE NEGATIVE Final    Comment:        The GeneXpert MRSA Assay (FDA approved for NASAL specimens only), is one component of a comprehensive MRSA colonization surveillance program. It is not intended to diagnose MRSA infection nor to guide or monitor treatment for MRSA infections. Performed at [facility], [address], [place], [place] [address]   Culture, expectorated sputum-assessment     Status: None   Collection Time: 07/16/17  1:24 AM  Result Value Ref Range Status   Specimen Description SPUTUM  Final   Special Requests NONE  Final   Sputum evaluation   Final    THIS SPECIMEN IS ACCEPTABLE FOR SPUTUM CULTURE Performed at [facility], [address], [place], [place] [address]    Report Status 07/16/2017 FINAL  Final  Culture, respiratory (NON-Expectorated)     Status: None (Preliminary result)   Collection Time: 07/16/17  1:24 AM  Result Value Ref Range Status   Specimen Description   Final    SPUTUM Performed at [facility], [address], [place], [place] [address]    Special Requests   Final    NONE Reflexed from [id number] [id number] Performed at [facility], [address] [place], [place] [address]    Gram Stain   Final    MODERATE WBC PRESENT, PREDOMINANTLY PMN RARE SQUAMOUS EPITHELIAL CELLS PRESENT FEW GRAM POSITIVE COCCI IN CHAINS FEW BUDDING YEAST SEEN Performed at [facility], [address] [place], [place] [address]     Culture PENDING  Incomplete   Report Status PENDING  Incomplete    Studies/Results: No results found.   Patient was seen, examined,treatment plan was discussed with the resident.  I have directly reviewed the clinical findings, lab, imaging studies and management of this patient in detail. I have made the necessary changes and/or additions to the above noted documentation, and agree with the documentation, as recorded by the resident.

## 2017-07-17 NOTE — Progress Notes (Signed)
PULMONARY / CRITICAL CARE MEDICINE   Name: Evan [name] MRN: [id number] DOB: 1940-09-13    ADMISSION DATE:  07/15/2017 CONSULTATION DATE:  07/15/2017  REFERRING MD:  Dr. Roger Shelter   CHIEF COMPLAINT:  Urosepsis   HISTORY OF PRESENT ILLNESS:   77 year old male with PMH of BPH s/p TURP, Anxiety/Depression, Cervical Spondylosis, Chronic Pain, Chronic Urinary Rentention (Self Caths at night), COPD, GERD, Diastolic HF    Presented to [facility] ED on 3/29 with severe right sided flank pain. CT A/P with 5 mm right distal urethral stone with proximal hydronephrosis. U/A positive, WBC 16.0. Vitals WNL. Pain resolved. Urology okay with discharge with follow up appointment. Shortly later patient presented to [facility] ED after becoming lightheaded and short of breath. LA  4.39, Given Vancomycin and Zosyn with 30 cc/kg LR bolus. Now hypotensive and requiring Neo gtt. Transferring to [facility] for stent placement.    SUBJECTIVE:  No events overnight, feels better this AM  VITAL SIGNS: BP 117/68   Pulse 74   Temp 98.7 F (37.1 C) (Axillary)   Resp (!) 21   Ht 5\' 6"  (1.676 m)   Wt 230 lb 9.6 oz (104.6 kg)   SpO2 95%   BMI 37.22 kg/m   HEMODYNAMICS:    VENTILATOR SETTINGS:    INTAKE / OUTPUT: I/O last 3 completed shifts: In: 7574.7 [P.O.:240; I.V.:5934.7; IV Piggyback:1400] Out: 3450 [Urine:3450]  PHYSICAL EXAMINATION: General:  Obese male, resting in bed, NAD Neuro:  Alert and interactive, moving all ext to command HEENT:  [place]/AT, PERRL, EOM-I and MMM Cardiovascular:  RRR, Nl S1/S2 and -M/R/G Lungs:  Decreased BS at the bases Abdomen:  Soft, NT, ND and +BS Musculoskeletal:  +1 BLE  Skin:  Warm, intact   LABS:  BMET Recent Labs  Lab 07/15/17 1758 07/15/17 2352 07/16/17 0206 07/17/17 0332  NA 138 140 140 141  K 4.1 3.9 4.0 3.7  CL 106  --  111 112*  CO2 19*  --  21* 24  BUN 27*  --  28* 20  CREATININE 1.33*  --  1.16 0.80  GLUCOSE 172*  --  132* 93   Electrolytes Recent Labs  Lab  07/15/17 1758 07/16/17 0206 07/17/17 0332  CALCIUM 8.6* 7.8* 8.0*  MG  --  1.6* 2.3  PHOS  --  3.4 2.7   CBC Recent Labs  Lab 07/15/17 1758 07/15/17 2352 07/16/17 0206 07/17/17 0332  WBC 16.2*  --  31.1* 16.5*  HGB 12.4* 10.2* 10.8* 10.2*  HCT 38.1* 30.0* 33.5* 31.7*  PLT 219  --  212 162   Coag's Recent Labs  Lab 07/15/17 1758  INR 1.11   Sepsis Markers Recent Labs  Lab 07/15/17 2013 07/16/17 0206 07/16/17 0556 07/17/17 0332  LATICACIDVEN 4.8* 2.3* 1.0  --   PROCALCITON 5.72  --   --  9.69   ABG Recent Labs  Lab 07/15/17 2352  PHART 7.370  PCO2ART 37.8  PO2ART 112.0*   Liver Enzymes Recent Labs  Lab 07/15/17 1043 07/15/17 1758  AST 30 56*  ALT 23 36  ALKPHOS 108 112  BILITOT 0.8 0.7  ALBUMIN 3.8 3.0*   Cardiac Enzymes No results for input(s): TROPONINI, PROBNP in the last 168 hours.  Glucose Recent Labs  Lab 07/16/17 1208 07/16/17 1606 07/16/17 1908 07/16/17 2331 07/17/17 0327 07/17/17 0738  GLUCAP 225* 105* 155* 131* 90 107*   Imaging I reviewed CXR myself, no acute disease noted  STUDIES:  CT A/P 3/29 > 5 mm  right UVJ calculus causing a high-grade obstruction. Bilateral renal calculi CXR 3/29 > Emphysematous disease. Linear scarring and/or atelectasis at the left base and lingula  CULTURES: Blood 3/29 >>1/4 coag neg staph Urine 3/29 >> NTD Sputum 3/29 >> NTD  ANTIBIOTICS: Zosyn 3/29 off Vancomycin 3/29 >> Cefepime 3/29 >> Azithromycin 3/29 >>   SIGNIFICANT EVENTS: 3/29 > Presents to ED   LINES/TUBES: PIV  DISCUSSION: 77 year old male presents to ED on 3/29 with right sided flank pain found to have Urosepsis secondary to 5 mm right UVJ causing high grade obstruction with hydronephrosis. [facility].  Discussed with TRH-MD  ASSESSMENT / PLAN:  PULMONARY A: Acute Hypoxic Respiratory Failure, Atelectasis to Left Base and Lingula  H/O Chronic Bronchitis, 150 pack year smoking history  (Quiet 2016), Sub Cm pulmonary nodules  PFTs 05/31/17 FVC 2.73 [77%], FEV1 2.08 [82%], F/F 76, TLC 84%, DLCO 54% Mild obstruction, moderate diffusion defect P:   Wean Oxygen to Maintain Saturation >92 Duoneb PRN  Pulmonary Hygiene   Recent Steroid Taper Started Tuesday, on Day 3 on 30 mg today, will decrease to 20 mg after day 3 (decrease to 20 mg PO daily on 4/1).  CARDIOVASCULAR A:  Septic Shock  Diastolic HF (EF 50-55)  P:  Cardiac Monitoring. D/C neo  RENAL A:   Urosepsis in setting of UVJ Stone with Hydronephrosis  CT A/P with High-Grade Obstructive involving right kidney with Hydroureteronephrosis with 5 mm right UVJ Calculus  Lactic Acidosis s/p 3L LR H/O BPH s/p TURP P:   Urology Following > stent per urology Urology to decide if patient if patient is to go home with a catheter or not Trend BMP Replace electrolytes as indicated KVO IVF Continue Flomax   GASTROINTESTINAL A:   GERD Nausea/Vomiting  P:   Heart healthy diabetic diet PPI Zofran PRN   HEMATOLOGIC A:  VTE Prophylaxis  P:  Trend CBC  Heparin SQ  INFECTIOUS A:   Urosepsis in setting of UVJ Stone with Hydronephrosis  +U/A  Concern for CAP given sputum and left base atelectasis  P:   Trend Fever and WBC curve  PAN culture  Vancomycin, Cefepime and zithromax  ENDOCRINE A:   Hyperglycemia (Currently on Steroid Taper)  P:   Trend glucose  SSI   NEUROLOGIC A:   Acute/Chronic Pain  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  Alyson ReedyWesam G. Slevin Gunby, M.D. [facility] Pulmonary/Critical [facility] Medicine. Pager: [phone]. After hours pager: [phone].

## 2017-07-17 NOTE — Discharge Instructions (Signed)

## 2017-07-17 NOTE — Progress Notes (Signed)
PHARMACY - PHYSICIAN COMMUNICATION CRITICAL VALUE ALERT - BLOOD CULTURE IDENTIFICATION (BCID)  Evan [name] is an 77 y.o. male who [facility] with a chief complaint of right sided flank pain and right distal ureteral stone.  He had pyelogram/ureteral stent placement on 3/29. Vancomycin, cefepime, azithromycin started on admission for broad coverage for urosepsis/PNA.  Name of physician (or Provider) Contacted: Dr. Molli KnockYacoub  Current antibiotics: vancomycin, cefepime and azithromycin  Changes to prescribed antibiotics recommended: continue current abx regimen   Results for orders placed or performed during the hospital encounter of 07/15/17  Blood Culture ID Panel (Reflexed) (Collected: 07/15/2017  6:10 PM)  Result Value Ref Range   Enterococcus species NOT DETECTED NOT DETECTED   Listeria monocytogenes NOT DETECTED NOT DETECTED   Staphylococcus species DETECTED (A) NOT DETECTED   Staphylococcus aureus NOT DETECTED NOT DETECTED   Methicillin resistance DETECTED (A) NOT DETECTED   Streptococcus species NOT DETECTED NOT DETECTED   Streptococcus agalactiae NOT DETECTED NOT DETECTED   Streptococcus pneumoniae NOT DETECTED NOT DETECTED   Streptococcus pyogenes NOT DETECTED NOT DETECTED   Acinetobacter baumannii NOT DETECTED NOT DETECTED   Enterobacteriaceae species NOT DETECTED NOT DETECTED   Enterobacter cloacae complex NOT DETECTED NOT DETECTED   Escherichia coli NOT DETECTED NOT DETECTED   Klebsiella oxytoca NOT DETECTED NOT DETECTED   Klebsiella pneumoniae NOT DETECTED NOT DETECTED   Proteus species NOT DETECTED NOT DETECTED   Serratia marcescens NOT DETECTED NOT DETECTED   Haemophilus influenzae NOT DETECTED NOT DETECTED   Neisseria meningitidis NOT DETECTED NOT DETECTED   Pseudomonas aeruginosa NOT DETECTED NOT DETECTED   Candida albicans NOT DETECTED NOT DETECTED   Candida glabrata NOT DETECTED NOT DETECTED   Candida krusei NOT DETECTED NOT  DETECTED   Candida parapsilosis NOT DETECTED NOT DETECTED   Candida tropicalis NOT DETECTED NOT DETECTED    Krishiv Sandler P 07/17/2017  9:00 AM

## 2017-07-18 DIAGNOSIS — A411 Sepsis due to other specified staphylococcus: Principal | ICD-10-CM

## 2017-07-18 LAB — CBC
HCT: 31.3 % — ABNORMAL LOW (ref 39.0–52.0)
HEMOGLOBIN: 10.3 g/dL — AB (ref 13.0–17.0)
MCH: 30.4 pg (ref 26.0–34.0)
MCHC: 32.9 g/dL (ref 30.0–36.0)
MCV: 92.3 fL (ref 78.0–100.0)
PLATELETS: 168 10*3/uL (ref 150–400)
RBC: 3.39 MIL/uL — AB (ref 4.22–5.81)
RDW: 14.4 % (ref 11.5–15.5)
WBC: 13.3 10*3/uL — AB (ref 4.0–10.5)

## 2017-07-18 LAB — BASIC METABOLIC PANEL
ANION GAP: 7 (ref 5–15)
BUN: 17 mg/dL (ref 6–20)
CHLORIDE: 107 mmol/L (ref 101–111)
CO2: 25 mmol/L (ref 22–32)
Calcium: 8.2 mg/dL — ABNORMAL LOW (ref 8.9–10.3)
Creatinine, Ser: 0.73 mg/dL (ref 0.61–1.24)
GFR calc Af Amer: >60 mL/min (ref >60)
Glucose, Bld: 127 mg/dL — ABNORMAL HIGH (ref 65–99)
Potassium: 3.8 mmol/L (ref 3.5–5.1)
SODIUM: 139 mmol/L (ref 135–145)

## 2017-07-18 LAB — URINE CULTURE: Culture: >=100000 — AB

## 2017-07-18 LAB — MAGNESIUM: MAGNESIUM: 1.8 mg/dL (ref 1.7–2.4)

## 2017-07-18 LAB — PHOSPHORUS: PHOSPHORUS: 2.4 mg/dL — AB (ref 2.5–4.6)

## 2017-07-18 LAB — GLUCOSE, CAPILLARY
GLUCOSE-CAPILLARY: 106 mg/dL — AB (ref 65–99)
GLUCOSE-CAPILLARY: 153 mg/dL — AB (ref 65–99)
GLUCOSE-CAPILLARY: 160 mg/dL — AB (ref 65–99)
Glucose-Capillary: 117 mg/dL — ABNORMAL HIGH (ref 65–99)
Glucose-Capillary: 118 mg/dL — ABNORMAL HIGH (ref 65–99)
Glucose-Capillary: 177 mg/dL — ABNORMAL HIGH (ref 65–99)

## 2017-07-18 MED ORDER — VANCOMYCIN HCL 10 G IV SOLR
1500.0000 mg | INTRAVENOUS | Status: DC
  Administered 2017-07-18 – 2017-07-19 (×2): 1500 mg via INTRAVENOUS
  Filled 2017-07-18 (×2): qty 1500

## 2017-07-18 MED ORDER — SERTRALINE HCL 25 MG PO TABS
25.0000 mg | ORAL_TABLET | Freq: Every day | ORAL | Status: DC
  Administered 2017-07-18 – 2017-07-19 (×2): 25 mg via ORAL
  Filled 2017-07-18 (×2): qty 1

## 2017-07-18 MED ORDER — IPRATROPIUM-ALBUTEROL 0.5-2.5 (3) MG/3ML IN SOLN
3.0000 mL | Freq: Two times a day (BID) | RESPIRATORY_TRACT | Status: DC
Start: 2017-07-18 — End: 2017-07-20
  Administered 2017-07-18 – 2017-07-19 (×3): 3 mL via RESPIRATORY_TRACT
  Filled 2017-07-18 (×3): qty 3

## 2017-07-18 MED ORDER — SODIUM CHLORIDE 0.9 % IV SOLN
2.0000 g | INTRAVENOUS | Status: DC
  Administered 2017-07-18: 2 g via INTRAVENOUS
  Filled 2017-07-18: qty 20

## 2017-07-18 MED ORDER — ATORVASTATIN CALCIUM 20 MG PO TABS
20.0000 mg | ORAL_TABLET | Freq: Every day | ORAL | Status: DC
  Administered 2017-07-18 – 2017-07-19 (×2): 20 mg via ORAL
  Filled 2017-07-18 (×2): qty 1

## 2017-07-18 MED ORDER — CARBIDOPA-LEVODOPA 25-100 MG PO TABS
1.0000 | ORAL_TABLET | Freq: Three times a day (TID) | ORAL | Status: DC
  Administered 2017-07-18 – 2017-07-19 (×5): 1 via ORAL
  Filled 2017-07-18 (×5): qty 1

## 2017-07-18 MED ORDER — FLUTICASONE-UMECLIDIN-VILANT 100-62.5-25 MCG/INH IN AEPB: 1.0000 | INHALATION_SPRAY | Freq: Every day | RESPIRATORY_TRACT | Status: DC

## 2017-07-18 MED ORDER — FLUTICASONE FUROATE-VILANTEROL 100-25 MCG/INH IN AEPB
1.0000 | INHALATION_SPRAY | Freq: Every day | RESPIRATORY_TRACT | Status: DC
  Administered 2017-07-19: 07:00:00 1 via RESPIRATORY_TRACT
  Filled 2017-07-18: qty 28

## 2017-07-18 MED ORDER — PREDNISONE 20 MG PO TABS
20.0000 mg | ORAL_TABLET | Freq: Every day | ORAL | Status: DC
  Administered 2017-07-18 – 2017-07-19 (×2): 20 mg via ORAL
  Filled 2017-07-18 (×2): qty 1

## 2017-07-18 MED ORDER — UMECLIDINIUM BROMIDE 62.5 MCG/INH IN AEPB
1.0000 | INHALATION_SPRAY | Freq: Every day | RESPIRATORY_TRACT | Status: DC
  Administered 2017-07-19: 07:00:00 1 via RESPIRATORY_TRACT
  Filled 2017-07-18: qty 7

## 2017-07-18 MED ORDER — INSULIN ASPART 100 UNIT/ML ~~LOC~~ SOLN: 2.0000 [IU] | Freq: Three times a day (TID) | SUBCUTANEOUS | Status: DC

## 2017-07-18 MED ORDER — PANTOPRAZOLE SODIUM 40 MG PO TBEC: 40.0000 mg | DELAYED_RELEASE_TABLET | Freq: Every day | ORAL | Status: DC

## 2017-07-18 NOTE — Telephone Encounter (Signed)
Called and spoke to patient's wife, Lupita LeashDonna.  Gave results per Dr. Isaiah SergeMannam. Patient's wife is worried that he isn't getting his respiratory medications because he is in the hospital. Patient's wife reported that patient had a kidney stone and then developed an infection.  Lupita LeashDonna also stated that the respiratory therapist has been giving nebulized treatments.  Belva Bertindvised Donna to check with patient's RN about what medications patient is getting and that in the hospital some medications might be different but continue office recommendations once discharged.  Will route to Dr. Silvestre GunnerMannan as Lorain ChildesFYI

## 2017-07-18 NOTE — Evaluation (Addendum)
Physical Therapy Evaluation Patient Details Name: Evan [name] MRN: [id number] DOB: Oct 11, 1940 Today's Date: 07/18/2017   History of Present Illness  77 year old male with urosepsis 2/2 to a obstructing 5 mm right UVJ stone with hx of BPH s/p TURP, Anxiety/Depression, Cervical Spondylosis, Chronic Pain, Chronic Urinary Rentention (Self Caths at night), COPD, GERD, Diastolic HF   Clinical Impression  Pt admitted with above diagnosis. Pt currently with functional limitations due to the deficits listed below (see PT Problem List).  Pt will benefit from skilled PT to increase their independence and safety with mobility to allow discharge to the venue listed below.   Pt reports being nonambulatory at baseline and typically stand pivot transfers to power w/c for mobility.  Pt reports he plans to d/c home, has all equipment, and politely declines HHPT.  Pt agreeable for PT to check back with him during acute stay.  Pt encouraged to transfer to [facility] and recliner during the day with nursing staff to maintain current mobility and strength.  SPO2 92% on room air at rest, 89% on room air during transfers, and 93% on room air during end of session.  Pt left off of O2 [place] and RN notified.     Follow Up Recommendations Home health PT(politely declines HHPT)    Equipment Recommendations  None recommended by PT    Recommendations for Other Services       Precautions / Restrictions Precautions Precautions: Fall Restrictions Weight Bearing Restrictions: No      Mobility  Bed Mobility Overal bed mobility: Needs Assistance Bed Mobility: Supine to Sit     Supine to sit: Mod assist;HOB elevated     General bed mobility comments: requests assist for LEs  Transfers Overall transfer level: Needs assistance Equipment used: None Transfers: Stand Pivot Transfers   Stand pivot transfers: Min guard       General transfer comment: min/guard for safety and lines, pt utilizes armrests to self  assist, reports having grab bars around home and other equipment for self assisting (lift chair which pt usually sleeps in)  Ambulation/Gait             General Gait Details: nonambulatory per pt  Stairs            Wheelchair Mobility    Modified Rankin (Stroke Patients Only)       Balance Overall balance assessment: Needs assistance         Standing balance support: Bilateral upper extremity supported;During functional activity Standing balance-Leahy Scale: Poor Standing balance comment: requries bil UE support                             Pertinent Vitals/Pain Pain Assessment: No/denies pain    Home Living Family/patient expects to be discharged to:: Private residence Living Arrangements: Spouse/significant other Available Help at Discharge: Family Type of Home: House Home Access: Ramped entrance     Home Layout: One level Home Equipment: Shower seat - built in;Grab bars - tub/shower;Wheelchair - power;Wheelchair - manual      Prior Function Level of Independence: Needs assistance   Gait / Transfers Assistance Needed: transfers only, able to stand and shuffle with transfers, has power w/c           Hand Dominance        Extremity/Trunk Assessment        Lower Extremity Assessment Lower Extremity Assessment: (hx of bil LE weakness, multiple back surgeries)  Communication   Communication: HOH  Cognition Arousal/Alertness: Awake/alert Behavior During Therapy: WFL for tasks assessed/performed Overall Cognitive Status: Within Functional Limits for tasks assessed                                        General Comments      Exercises     Assessment/Plan    PT Assessment Patient needs continued PT services  PT Problem List Decreased strength;Decreased mobility;Decreased activity tolerance;Decreased balance;Decreased knowledge of use of DME;Decreased knowledge of precautions       PT Treatment  Interventions DME instruction;Therapeutic activities;Patient/family education;Therapeutic exercise;Balance training;Wheelchair mobility training;Functional mobility training    PT Goals (Current goals can be found in the Care Plan section)  Acute Rehab PT Goals PT Goal Formulation: With patient Time For Goal Achievement: 08/01/17 Potential to Achieve Goals: Good    Frequency Min 3X/week   Barriers to discharge        Co-evaluation               AM-PAC PT "6 Clicks" Daily Activity  Outcome Measure Difficulty turning over in bed (including adjusting bedclothes, sheets and blankets)?: A Lot Difficulty moving from lying on back to sitting on the side of the bed? : Unable Difficulty sitting down on and standing up from a chair with arms ([name].g., wheelchair, bedside commode, etc,.)?: Unable Help needed moving to and from a bed to chair (including a wheelchair)?: A Little Help needed walking in hospital room?: Total Help needed climbing 3-5 steps with a railing? : Total 6 Click Score: 9    End of Session Equipment Utilized During Treatment: Gait belt Activity Tolerance: Patient tolerated treatment well Patient left: in chair;with family/visitor present;with call bell/phone within reach Nurse Communication: Mobility status PT Visit Diagnosis: Other abnormalities of gait and mobility (R26.89)    Time: [id number] PT Time Calculation (min) (ACUTE ONLY): 15 min   Charges:   PT Evaluation $PT Eval Low Complexity: 1 Low     PT G CodesZenovia Jarred:        Evan [name], PT, DPT 07/18/2017 Pager: [phone]   Evan [name],Evan [name] 07/18/2017, 12:21 PM

## 2017-07-18 NOTE — Progress Notes (Signed)
3 Days Post-Op Subjective: NAE.  No complaints this AM.  Leukocytosis and renal function improving.  Wants to go home.   Objective: Vital signs in last 24 hours: Temp:  [98.2 F (36.8 C)-98.6 F (37 C)] 98.6 F (37 C) (04/01 0422) Pulse Rate:  [68-77] 70 (04/01 0422) Resp:  [18-24] 18 (04/01 0832) BP: (120-142)/(50-80) 142/50 (04/01 0422) SpO2:  [95 %-96 %] 95 % (04/01 0832) Weight:  [102.4 kg (225 lb 12 oz)] 102.4 kg (225 lb 12 oz) (04/01 0422)  Intake/Output from previous day: 03/31 0701 - 04/01 0700 In: 1240 [P.O.:600; I.V.:40; IV Piggyback:600] Out: 2700 [Urine:2700]  Intake/Output this shift: No intake/output data recorded.  Physical Exam:  General: Alert and oriented CV: RRR, palpable distal pulses Lungs: CTAB, equal chest rise Abdomen: Soft, NTND, no rebound or guarding GU: Foley in place and draining clear-yellow urine Ext: NT, No erythema  Lab Results: Recent Labs    07/16/17 0206 07/17/17 0332 07/18/17 0514  HGB 10.8* 10.2* 10.3*  HCT 33.5* 31.7* 31.3*   BMET Recent Labs    07/17/17 0332 07/18/17 0514  NA 141 139  K 3.7 3.8  CL 112* 107  CO2 24 25  GLUCOSE 93 127*  BUN 20 17  CREATININE 0.80 0.73  CALCIUM 8.0* 8.2*     Studies/Results: No results found.  Assessment/Plan: 77 year old male with urosepsis 2/2 to a obstructing 5 mm right UVJ stone  -Final urine cx pending.  Currently on cefepime and vanc.  He will need a total of 2 weeks of abx.   -Will arrange OP right ureteroscopy in 3 weeks -Home with Foley.     LOS: 3 days   Rhoderick Moodyhristopher Damein Gaunce, MD 07/18/2017, 8:46 AM  [facility] Pager: [phone]

## 2017-07-18 NOTE — Progress Notes (Signed)
Pharmacy Antibiotic Note  Evan [name] is a 77 y.o. male admitted on 07/15/2017 with urosepsis.  PMH is significant for BPH status post TURP, chronic urinary retention with self cath at nighttime at home.  Pharmacy has been consulted for Vancomcyin dosing.  Today, 07/18/2017: Day # 4 antibiotics, narrowed to Vancomycin alone Afebrile WBC elevated but decreasing. Scr down to 0.73 (crcl ~64N)  Plan:  Increase to Vancomycin 1500 mg IV q24h.  Measure Vanc trough at steady state.  Follow up renal fxn, repeat culture results, and clinical course.  Note:  Dr. Luciana Axeomer recommends treating with Vancomycin x14 days total.   Height: 5\' 6"  (167.6 cm) Weight: 225 lb 12 oz (102.4 kg) IBW/kg (Calculated) : 63.8  Temp (24hrs), Avg:98.4 F (36.9 C), Min:98.2 F (36.8 C), Max:98.6 F (37 C)  Recent Labs  Lab 07/15/17 1043 07/15/17 1758 07/15/17 1806 07/15/17 2013 07/16/17 0206 07/16/17 0556 07/17/17 0332 07/18/17 0514  WBC 16.0* 16.2*  --   --  31.1*  --  16.5* 13.3*  CREATININE 0.91 1.33*  --   --  1.16  --  0.80 0.73  LATICACIDVEN  --   --  4.39* 4.8* 2.3* 1.0  --   --     Estimated Creatinine Clearance: 88 mL/min (by C-G formula based on SCr of 0.73 mg/dL).    Allergies  Allergen Reactions  . Hydromorphone Itching  . Oxycodone Itching and Nausea Only    Antimicrobials this admission: 3/29 CTX x1, 4/1 >> 4/1 3/29 zosyn x1 3/29 cefepime>> 4/1 3/29 vanc >> 3/26 azithro >> 4/1   Dose adjustments this admission: 4/1 Vancomycin empirically adjusted for improving renal function  Microbiology results: 3/29 BCx x2: 2/4 GPC in clusters (BCID= MR CoNS) 3/29 at 1114 UCx: CoNS 3/29 at 2248 UCx (cystoscopy): >100k UNIDENTIFIED ORGANISM  3/30 MRSA PCR: neg 3/30 sputum: few GPC in chains, yeast; Cxt c/w normal flora   Thank you for allowing pharmacy to be a part of this patient's care.  Lynann Beaverhristine Izzy Doubek PharmD, BCPS Pager [phone] 07/18/2017 11:01 AM

## 2017-07-18 NOTE — Care Management Important Message (Signed)
Important Message  Patient Details  Name: Evan HillockRichard A Athanas MRN: [id number] Date of Birth: 28-Feb-1941   Medicare Important Message Given:  Yes    Caren MacadamFuller, Jaiona Simien 07/18/2017, 11:48 AMImportant Message  Patient Details  Name: Evan HillockRichard A Micucci MRN: [id number] Date of Birth: 28-Feb-1941   Medicare Important Message Given:  Yes    Caren MacadamFuller, Tydarius Yawn 07/18/2017, 11:48 AM

## 2017-07-18 NOTE — Progress Notes (Signed)
PROGRESS NOTE    Evan [name]  ZOX:[id number] DOB: 09-19-1940 DOA: 07/15/2017 PCP: Sharlene DoryWendling, Nicholas Paul, DO     Brief Narrative:  Evan [name] is a 77 yo male with past medical history of BPH status post TURP, cervical spondylolysis, chronic pain, chronic urinary retention with self cath at nighttime at home, COPD, GERD, diastolic heart failure, anxiety and depression who presented to [facility] emergency department on 3/29 with severe right-sided flank pain.  CT abdomen pelvis revealed 5 mm right distal ureteral stone with proximal hydronephrosis.  Urology was consulted and patient was to follow-up with urology as outpatient.  Patient soon returned to [facility] emergency department after being lightheaded and short of breath.  He was hypotensive and required pressors with admission to the ICU.  [facility] and [facility]   Assessment & Plan:   Active Problems:   Sepsis (HCC)   Septic shock secondary to UVJ stone with hydronephrosis, coag neg staph bacteremia  -Now off pressors -S/p right ureteral stent placement 3/29  -Urology following - follow up outpatient for right ureteroscopy in 3 weeks -Blood culture 3/29 showed 2 of 2 coag neg staph  -Repeat blood cultures ordered 4/1 -Urine culture 3/29 >100,000 coag neg staph  -Urine culture from cystoscopy 3/29, >100,000 unidentified organism  -IV vanco   Acute hypoxic respiratory failure secondary to chronic bronchitis, COPD  -Sputum culture with normal respiratory flora  -Steroid taper  -Nebs   BPH -Foley in place for discharge -Continue flomax   HLD -Continue lipitor   Parkinson's -Continue sinemet   GERD -PPI   Depression -Continue zoloft      DVT prophylaxis: Subq hep Code Status: Full Family Communication: Wife at bedside Disposition Plan: Pending culture results for antibiotic deescalation, will DC home with home health     Consultants:   Urology  PCCM   Antimicrobials:  Anti-infectives (From admission, onward)   Start     Dose/Rate Route Frequency Ordered Stop   07/18/17 1000  cefTRIAXone (ROCEPHIN) 2 g in sodium chloride 0.9 % 100 mL IVPB  Status:  Discontinued     2 g 200 mL/hr over 30 Minutes Intravenous Every 24 hours 07/18/17 0931 07/18/17 1149   07/17/17 0500  vancomycin (VANCOCIN) IVPB 1000 mg/200 mL premix     1,000 mg 200 mL/hr over 60 Minutes Intravenous Every 24 hours 07/16/17 0903     07/16/17 0600  vancomycin (VANCOCIN) IVPB 750 mg/150 ml premix  Status:  Discontinued     750 mg 150 mL/hr over 60 Minutes Intravenous Every 12 hours 07/15/17 2007 07/16/17 0902   07/16/17 0200  piperacillin-tazobactam (ZOSYN) IVPB 3.375 g  Status:  Discontinued     3.375 g 12.5 mL/hr over 240 Minutes Intravenous Every 8 hours 07/15/17 2007 07/15/17 2029   07/16/17 0000  azithromycin (ZITHROMAX) 500 mg in sodium chloride 0.9 % 250 mL IVPB  Status:  Discontinued     500 mg 250 mL/hr over 60 Minutes Intravenous Daily at bedtime 07/15/17 2348 07/18/17 0931   07/15/17 2345  azithromycin (ZITHROMAX) 250 mg in dextrose 5 % 125 mL IVPB  Status:  Discontinued     250 mg 125 mL/hr over 60 Minutes Intravenous Daily at bedtime 07/15/17 2000 07/15/17 2348   07/15/17 2200  ceFEPIme (MAXIPIME) 2 g in sodium chloride 0.9 % 100 mL IVPB  Status:  Discontinued     2 g 200 mL/hr over 30 Minutes Intravenous Every 12  hours 07/15/17 2029 07/18/17 0931   07/15/17 1815  ceFEPIme (MAXIPIME) 2 g in sodium chloride 0.9 % 100 mL IVPB  Status:  Discontinued     2 g 200 mL/hr over 30 Minutes Intravenous  Once 07/15/17 1803 07/15/17 1803   07/15/17 1815  vancomycin (VANCOCIN) IVPB 1000 mg/200 mL premix     1,000 mg 200 mL/hr over 60 Minutes Intravenous  Once 07/15/17 1803 07/15/17 1935   07/15/17 1815  piperacillin-tazobactam (ZOSYN) IVPB 3.375 g     3.375 g 100 mL/hr over 30 Minutes Intravenous  Once 07/15/17 1803 07/15/17 1906         Subjective: Patient feeling well and wants to go home.  No issues overnight.  Objective: Vitals:   07/17/17 2007 07/17/17 2119 07/18/17 0422 07/18/17 0832  BP: 120/80  (!) 142/50   Pulse: 75  70   Resp: 20  18 18   Temp: 98.5 F (36.9 C)  98.6 F (37 C)   TempSrc: Oral  Oral   SpO2: 95% 95% 96% 95%  Weight:   102.4 kg (225 lb 12 oz)   Height:        Intake/Output Summary (Last 24 hours) at 07/18/2017 1231 Last data filed at 07/18/2017 1136 Gross per 24 hour  Intake 550 ml  Output 3450 ml  Net -2900 ml   Filed Weights   07/16/17 0409 07/17/17 0300 07/18/17 0422  Weight: 102.4 kg (225 lb 12 oz) 104.6 kg (230 lb 9.6 oz) 102.4 kg (225 lb 12 oz)    Examination:  General exam: Appears calm and comfortable  Respiratory system: Clear to auscultation. Respiratory effort normal. Cardiovascular system: S1 & S2 heard, RRR with PACs on telemetry. No JVD, murmurs, rubs, gallops or clicks. No pedal edema. Gastrointestinal system: Abdomen is nondistended, soft and nontender. No organomegaly or masses felt. Normal bowel sounds heard. Central nervous system: Alert and oriented. No focal neurological deficits. Extremities: Symmetric 5 x 5 power. Skin: No rashes, lesions or ulcers Psychiatry: Judgement and insight appear normal. Mood & affect appropriate.   Data Reviewed: I have personally reviewed following labs and imaging studies  CBC: Recent Labs  Lab 07/12/17 1007 07/15/17 1043 07/15/17 1758 07/15/17 2352 07/16/17 0206 07/17/17 0332 07/18/17 0514  WBC 8.3 16.0* 16.2*  --  31.1* 16.5* 13.3*  NEUTROABS 5.5 13.2* 15.2*  --   --   --   --   HGB 12.0* 13.8 12.4* 10.2* 10.8* 10.2* 10.3*  HCT 35.1* 41.2 38.1* 30.0* 33.5* 31.7* 31.3*  MCV 90.7 92.2 91.8  --  94.6 93.8 92.3  PLT 223.0 256 219  --  212 162 168   Basic Metabolic Panel: Recent Labs  Lab 07/15/17 1043 07/15/17 1758 07/15/17 2352 07/16/17 0206 07/17/17 0332 07/18/17 0514  NA 139 138 140 140 141 139  K  4.2 4.1 3.9 4.0 3.7 3.8  CL 104 106  --  111 112* 107  CO2 22 19*  --  21* 24 25  GLUCOSE 161* 172*  --  132* 93 127*  BUN 27* 27*  --  28* 20 17  CREATININE 0.91 1.33*  --  1.16 0.80 0.73  CALCIUM 9.2 8.6*  --  7.8* 8.0* 8.2*  MG  --   --   --  1.6* 2.3 1.8  PHOS  --   --   --  3.4 2.7 2.4*   GFR: Estimated Creatinine Clearance: 88 mL/min (by C-G formula based on SCr of 0.73 mg/dL). Liver Function Tests: Recent Labs  Lab 07/15/17 1043 07/15/17 1758  AST 30 56*  ALT 23 36  ALKPHOS 108 112  BILITOT 0.8 0.7  PROT 7.6 6.2*  ALBUMIN 3.8 3.0*   Recent Labs  Lab 07/15/17 1043  LIPASE 16   No results for input(s): AMMONIA in the last 168 hours. Coagulation Profile: Recent Labs  Lab 07/15/17 1758  INR 1.11   Cardiac Enzymes: No results for input(s): CKTOTAL, CKMB, CKMBINDEX, TROPONINI in the last 168 hours. BNP (last 3 results) No results for input(s): PROBNP in the last 8760 hours. HbA1C: No results for input(s): HGBA1C in the last 72 hours. CBG: Recent Labs  Lab 07/17/17 2010 07/18/17 0010 07/18/17 0419 07/18/17 0718 07/18/17 1133  GLUCAP 163* 106* 117* 118* 160*   Lipid Profile: No results for input(s): CHOL, HDL, LDLCALC, TRIG, CHOLHDL, LDLDIRECT in the last 72 hours. Thyroid Function Tests: No results for input(s): TSH, T4TOTAL, FREET4, T3FREE, THYROIDAB in the last 72 hours. Anemia Panel: No results for input(s): VITAMINB12, FOLATE, FERRITIN, TIBC, IRON, RETICCTPCT in the last 72 hours. Sepsis Labs: Recent Labs  Lab 07/15/17 1806 07/15/17 2013 07/16/17 0206 07/16/17 0556 07/17/17 0332  PROCALCITON  --  5.72  --   --  9.69  LATICACIDVEN 4.39* 4.8* 2.3* 1.0  --     Recent Results (from the past 240 hour(s))  Urine culture     Status: Abnormal   Collection Time: 07/15/17 11:14 AM  Result Value Ref Range Status   Specimen Description   Final    URINE, CLEAN CATCH Performed at [facility], [address] [place], [place]  [address]    Special Requests   Final    NONE Performed at [facility], [address], [place], [place] [address]    Culture (A)  Final    >=100,000 COLONIES/mL STAPHYLOCOCCUS SPECIES (COAGULASE NEGATIVE)   Report Status 07/18/2017 FINAL  Final   Organism ID, Bacteria STAPHYLOCOCCUS SPECIES (COAGULASE NEGATIVE) (A)  Final      Susceptibility   Staphylococcus species (coagulase negative) - MIC*    CIPROFLOXACIN >=8 RESISTANT Resistant     GENTAMICIN <=0.5 SENSITIVE Sensitive     NITROFURANTOIN <=16 SENSITIVE Sensitive     OXACILLIN RESISTANT Resistant     TETRACYCLINE >=16 RESISTANT Resistant     VANCOMYCIN 1 SENSITIVE Sensitive     TRIMETH/SULFA 80 RESISTANT Resistant     CLINDAMYCIN <=0.25 SENSITIVE Sensitive     RIFAMPIN <=0.5 SENSITIVE Sensitive     Inducible Clindamycin NEGATIVE Sensitive     * >=100,000 COLONIES/mL STAPHYLOCOCCUS SPECIES (COAGULASE NEGATIVE)  Culture, blood (Routine x 2)     Status: None (Preliminary result)   Collection Time: 07/15/17  6:00 PM  Result Value Ref Range Status   Specimen Description BLOOD LEFT HAND  Final   Special Requests   Final    BOTTLES DRAWN AEROBIC AND ANAEROBIC Blood Culture adequate volume   Culture  Setup Time   Final    GRAM POSITIVE COCCI AEROBIC BOTTLE ONLY CRITICAL VALUE NOTED.  VALUE IS CONSISTENT WITH PREVIOUSLY REPORTED AND CALLED VALUE.    Culture   Final    GRAM POSITIVE COCCI IDENTIFICATION TO FOLLOW Performed at [facility], [address] [place], [place] [address]    Report Status PENDING  Incomplete  Culture, blood (Routine x 2)     Status: None (Preliminary result)   Collection Time: 07/15/17  6:10 PM  Result Value Ref Range Status   Specimen Description BLOOD LEFT ANTECUBITAL  Final   Special Requests   Final    BOTTLES DRAWN AEROBIC AND ANAEROBIC Blood Culture adequate volume   Culture  Setup Time   Final    GRAM POSITIVE COCCI IN CLUSTERS ANAEROBIC BOTTLE ONLY CRITICAL RESULT  CALLED TO, READ BACK BY AND VERIFIED WITH: T GREEN,PHARMD AT 0831 07/17/17 BY L BENFIELD    Culture   Final    GRAM POSITIVE COCCI IDENTIFICATION TO FOLLOW Performed at [facility], [address], [place], [place] [address]    Report Status PENDING  Incomplete  Blood Culture ID Panel (Reflexed)     Status: Abnormal   Collection Time: 07/15/17  6:10 PM  Result Value Ref Range Status   Enterococcus species NOT DETECTED NOT DETECTED Final   Listeria monocytogenes NOT DETECTED NOT DETECTED Final   Staphylococcus species DETECTED (A) NOT DETECTED Final    Comment: Methicillin (oxacillin) resistant coagulase negative staphylococcus. Possible blood culture contaminant (unless isolated from more than one blood culture draw or clinical case suggests pathogenicity). No antibiotic treatment is indicated for blood  culture contaminants. CRITICAL RESULT CALLED TO, READ BACK BY AND VERIFIED WITH: T GREEN,PHARMD AT 0831 07/17/17 BY L BENFIELD    Staphylococcus aureus NOT DETECTED NOT DETECTED Final   Methicillin resistance DETECTED (A) NOT DETECTED Final    Comment: CRITICAL RESULT CALLED TO, READ BACK BY AND VERIFIED WITH: T GREEN,PHARMD AT 0831 07/17/17 BY L BENFIELD    Streptococcus species NOT DETECTED NOT DETECTED Final   Streptococcus agalactiae NOT DETECTED NOT DETECTED Final   Streptococcus pneumoniae NOT DETECTED NOT DETECTED Final   Streptococcus pyogenes NOT DETECTED NOT DETECTED Final   Acinetobacter baumannii NOT DETECTED NOT DETECTED Final   Enterobacteriaceae species NOT DETECTED NOT DETECTED Final   Enterobacter cloacae complex NOT DETECTED NOT DETECTED Final   Escherichia coli NOT DETECTED NOT DETECTED Final   Klebsiella oxytoca NOT DETECTED NOT DETECTED Final   Klebsiella pneumoniae NOT DETECTED NOT DETECTED Final   Proteus species NOT DETECTED NOT DETECTED Final   Serratia marcescens NOT DETECTED NOT DETECTED Final   Haemophilus influenzae NOT DETECTED NOT DETECTED Final    Neisseria meningitidis NOT DETECTED NOT DETECTED Final   Pseudomonas aeruginosa NOT DETECTED NOT DETECTED Final   Candida albicans NOT DETECTED NOT DETECTED Final   Candida glabrata NOT DETECTED NOT DETECTED Final   Candida krusei NOT DETECTED NOT DETECTED Final   Candida parapsilosis NOT DETECTED NOT DETECTED Final   Candida tropicalis NOT DETECTED NOT DETECTED Final    Comment: Performed at [facility], [address], [place], [place] [address]  Urine Culture     Status: Abnormal (Preliminary result)   Collection Time: 07/15/17 10:48 PM  Result Value Ref Range Status   Specimen Description   Final    CYSTOSCOPY Performed at [facility], [address] [place], [place] [address]    Special Requests   Final    NONE Performed at [facility], [address], [place], [place] [address]    Culture (A)  Final    >=100,000 COLONIES/mL UNIDENTIFIED ORGANISM IDENTIFICATION AND SUSCEPTIBILITIES TO FOLLOW Performed at [facility], [address] [place], [place] [address]    Report Status PENDING  Incomplete  MRSA PCR Screening     Status: None   Collection Time: 07/16/17  1:02 AM  Result Value Ref Range Status   MRSA by PCR NEGATIVE NEGATIVE Final    Comment:  The GeneXpert MRSA Assay (FDA approved for NASAL specimens only), is one component of a comprehensive MRSA colonization surveillance program. It is not intended to diagnose MRSA infection nor to guide or monitor treatment for MRSA infections. Performed at [facility], [address], [place], [place] [address]   Culture, expectorated sputum-assessment     Status: None   Collection Time: 07/16/17  1:24 AM  Result Value Ref Range Status   Specimen Description SPUTUM  Final   Special Requests NONE  Final   Sputum evaluation   Final    THIS SPECIMEN IS ACCEPTABLE FOR SPUTUM CULTURE Performed at [facility] [address] [place], [place] [address]    Report Status 07/16/2017 FINAL  Final  Culture, respiratory (NON-Expectorated)     Status: None (Preliminary result)   Collection Time: 07/16/17  1:24 AM  Result Value Ref Range Status   Specimen Description   Final    SPUTUM Performed at [facility], [address], [place], [place] [address]    Special Requests   Final    NONE Reflexed from [id number] Performed at [facility], [address], [place], [place] [address]    Gram Stain   Final    MODERATE WBC PRESENT, PREDOMINANTLY PMN RARE SQUAMOUS EPITHELIAL CELLS PRESENT FEW GRAM POSITIVE COCCI IN CHAINS FEW BUDDING YEAST SEEN    Culture   Final    Consistent with normal respiratory flora. Performed at [facility], [address] [place], [place] [address]    Report Status PENDING  Incomplete       Radiology Studies: No results found.    Scheduled Meds: . atorvastatin  20 mg Oral Daily  . carbidopa-levodopa  1 tablet Oral TID  . heparin  5,000 Units Subcutaneous Q8H  . ipratropium-albuterol  3 mL Nebulization BID  . pantoprazole  40 mg Oral Daily  . predniSONE  20 mg Oral Q breakfast  . sertraline  25 mg Oral Daily  . tamsulosin  0.8 mg Oral QPC breakfast   Continuous Infusions: . sodium chloride    . vancomycin Stopped (07/18/17 0543)     LOS: 3 days    Time spent: 35 minutes   Noralee Stain, DO [facility] [email or web address] Password [facility] 07/18/2017, 12:31 PM

## 2017-07-19 ENCOUNTER — Inpatient Hospital Stay: Payer: Self-pay

## 2017-07-19 LAB — CBC
HCT: 33.2 % — ABNORMAL LOW (ref 39.0–52.0)
Hemoglobin: 10.9 g/dL — ABNORMAL LOW (ref 13.0–17.0)
MCH: 30.4 pg (ref 26.0–34.0)
MCHC: 32.8 g/dL (ref 30.0–36.0)
MCV: 92.5 fL (ref 78.0–100.0)
PLATELETS: 172 10*3/uL (ref 150–400)
RBC: 3.59 MIL/uL — ABNORMAL LOW (ref 4.22–5.81)
RDW: 14.3 % (ref 11.5–15.5)
WBC: 12.4 10*3/uL — AB (ref 4.0–10.5)

## 2017-07-19 LAB — BASIC METABOLIC PANEL
ANION GAP: 7 (ref 5–15)
BUN: 17 mg/dL (ref 6–20)
CALCIUM: 8.3 mg/dL — AB (ref 8.9–10.3)
CO2: 26 mmol/L (ref 22–32)
Chloride: 107 mmol/L (ref 101–111)
Creatinine, Ser: 0.69 mg/dL (ref 0.61–1.24)
GLUCOSE: 109 mg/dL — AB (ref 65–99)
Potassium: 3.6 mmol/L (ref 3.5–5.1)
SODIUM: 140 mmol/L (ref 135–145)

## 2017-07-19 LAB — URINE CULTURE

## 2017-07-19 LAB — CULTURE, BLOOD (ROUTINE X 2)
SPECIAL REQUESTS: ADEQUATE
Special Requests: ADEQUATE

## 2017-07-19 LAB — CULTURE, RESPIRATORY W GRAM STAIN: Culture: NORMAL

## 2017-07-19 LAB — CULTURE, RESPIRATORY

## 2017-07-19 MED ORDER — VANCOMYCIN IV (FOR PTA / DISCHARGE USE ONLY): 1500.0000 mg | INTRAVENOUS | 0 refills | Status: AC

## 2017-07-19 MED ORDER — SODIUM CHLORIDE 0.9% FLUSH: 10.0000 mL | INTRAVENOUS | Status: DC | PRN

## 2017-07-19 MED ORDER — TAMSULOSIN HCL 0.4 MG PO CAPS: 0.8000 mg | ORAL_CAPSULE | Freq: Every morning | ORAL | 0 refills | Status: DC

## 2017-07-19 MED ORDER — PREDNISONE 10 MG PO TABS: ORAL_TABLET | ORAL | 0 refills | Status: DC

## 2017-07-19 NOTE — Progress Notes (Signed)
Peripherally Inserted Central Catheter/Midline Placement  The IV Nurse has discussed with the patient and/or persons authorized to consent for the patient, the purpose of this procedure and the potential benefits and risks involved with this procedure.  The benefits include less needle sticks, lab draws from the catheter, and the patient may be discharged home with the catheter. Risks include, but not limited to, infection, bleeding, blood clot (thrombus formation), and puncture of an artery; nerve damage and irregular heartbeat and possibility to perform a PICC exchange if needed/ordered by physician.  Alternatives to this procedure were also discussed.  Bard Power PICC patient education guide, fact sheet on infection prevention and patient information card has been provided to patient /or left at bedside.    PICC/Midline Placement Documentation  PICC Single Lumen 07/19/17 PICC Right Basilic 45 cm 0 cm (Active)  Indication for Insertion or Continuance of Line Home intravenous therapies (PICC only) 07/19/2017  6:00 PM  Exposed Catheter (cm) 0 cm 07/19/2017  6:00 PM  Site Assessment Clean;Dry;Intact 07/19/2017  6:00 PM  Line Status Flushed;Blood return noted 07/19/2017  6:00 PM  Dressing Type Transparent 07/19/2017  6:00 PM  Dressing Status Clean;Dry;Intact;Antimicrobial disc in place 07/19/2017  6:00 PM  Dressing Change Due 07/26/17 07/19/2017  6:00 PM       Stacie GlazeJoyce, Cincere Zorn Horton 07/19/2017, 6:17 PM

## 2017-07-19 NOTE — Progress Notes (Addendum)
PHARMACY CONSULT NOTE FOR:  OUTPATIENT  PARENTERAL ANTIBIOTIC THERAPY (OPAT)  Indication: Coag Neg Staphylococcus Sp. Bacteremia and UTI Regimen: Vancomycin 1500 mg IV q24h End date: 07/28/17  Labs:  Check initial/first Vanc trough on Thursday 4/4.  Current dosing regimen started on 4/1.  Adjust to maintain trough level of 15-20.  IV antibiotic discharge orders are pended. To discharging provider:  please sign these orders via discharge navigator,  Select New Orders & click on the button choice - Manage This Unsigned Work.     Thank you for allowing pharmacy to be a part of this patient's care.  Jodelle GrossShade, Andric Kerce Elizabeth 07/19/2017, 9:46 AM

## 2017-07-19 NOTE — Care Management Note (Signed)
Case Management Note  Patient Details  Name: Evan [name] MRN: [id number] Date of Birth: 15-Nov-1940  Subjective/Objective:for PICC today. [facility] chosen for long term iv abx-rep Pam will talk to spouse for initial teaching @ bedside today, HHRN-instruction, HHPT. [facility] for ambulance transp-forms in shadow chart for nurse to call when ready. No further CM needs.                   Action/Plan:d/c home w/HHC/PT[facility]   Expected Discharge Date:  07/21/17               Expected Discharge Plan:  Home w Home Health Services  In-House Referral:     Discharge planning Services  CM Consult  Post Acute Care Choice:    Choice offered to:  Patient  DME Arranged:    DME Agency:     HH Arranged:  RN, PT, IV Antibiotics HH Agency:  [facility]  Status of Service:  Completed, signed off  If discussed at Long Length of Stay Meetings, dates discussed:    Additional Comments:  Lanier ClamMahabir, Wisdom Seybold, RN 07/19/2017, 9:58 AM

## 2017-07-19 NOTE — Progress Notes (Addendum)
Went over discharge papers with patient and family.  All questions answered.  Per MD pt to be discharged with foley catheter in placed, educated patient on importance of cleaning foley daily.  PT stated he understood how and how often.  Wife called and made aware [facility] transport set up for 8:15.

## 2017-07-19 NOTE — Progress Notes (Signed)
Patient discharge home via ambulance, patient in no pain/distress. Wife Lupita LeashDonna notified when [facility] arrived that patient is on his way home.

## 2017-07-19 NOTE — Discharge Summary (Signed)
Physician Discharge Summary  Evan [name] SWN:[id number] DOB: Apr 13, 1941 DOA: 07/15/2017  PCP: Shelda Pal, DO  Admit date: 07/15/2017 Discharge date: 07/19/2017  Admitted From: Home Disposition:  Home  Recommendations for Outpatient Follow-up:  1. Follow up with PCP in 1 week 2. Follow up outpatient urology for right ureteroscopy in 3 weeks 3. Please repeat CBC to ensure resolution of leukocytosis once finish antibiotic tx for sepsis and prednisone taper complete  4. Please follow up on the following pending results: final blood culture result from 4/1, negative at time of discharge   Home Health: PT RN  Equipment/Devices: None   Discharge Condition: Stable CODE STATUS: Full  Diet recommendation: Heart healthy   Brief/Interim Summary: Evan [name] is a 77 yo male with past medical history of BPH status post TURP, cervical spondylolysis, chronic pain, chronic urinary retention with self cath at nighttime at home, COPD, GERD, diastolic heart failure, anxiety and depression who presented to [facility] emergency department on 3/29 with severe right-sided flank pain.  CT abdomen pelvis revealed 5 mm right distal ureteral stone with proximal hydronephrosis.  Urology was consulted and patient was to follow-up with urology as outpatient.  Patient soon returned to [facility] emergency department after being lightheaded and short of breath.  He was hypotensive and required pressors with admission to the ICU.  He was transferred to [facility] for stent placement and transferred to [facility] 4/1. His blood culture resulted 2/2 with coag neg staph. Urine culture also resulted with same. Pharmacy discussed with ID who recommended total 14 day Vanco IV. PICC placed and patient discharged home with home health services.   Discharge Diagnoses:   Septic shock secondary to UVJ stone with hydronephrosis, coag neg staph bacteremia  -Now off pressors -S/p right ureteral stent  placement 3/29  -Urology consulted - follow up outpatient for right ureteroscopy in 3 weeks, Dr. Gilford Rile  -Blood culture 3/29 showed 2 of 2 coag neg staph  -Repeat blood cultures 4/1 negative thus far  -Urine culture 3/29 showed >100,000 coag neg staph  -Urine culture from cystoscopy 3/29 showed >100,000 coag neg staph  -IV vanco for total 14 days, last day 4/11. PICC placed today.   Acute hypoxic respiratory failure secondary to chronic bronchitis, COPD  -Sputum culture with normal respiratory flora  -Steroid taper  -Continue nebs and inhaler at home   BPH -Foley in place for discharge -Continue flomax   HLD -Continue lipitor   Parkinson's -Continue sinemet   GERD -PPI   Depression -Continue zoloft     Discharge Instructions  Discharge Instructions    Call MD for:  difficulty breathing, headache or visual disturbances   Complete by:  As directed    Call MD for:  extreme fatigue   Complete by:  As directed    Call MD for:  hives   Complete by:  As directed    Call MD for:  persistant dizziness or light-headedness   Complete by:  As directed    Call MD for:  persistant nausea and vomiting   Complete by:  As directed    Call MD for:  severe uncontrolled pain   Complete by:  As directed    Call MD for:  temperature >100.4   Complete by:  As directed    Diet - low sodium heart healthy   Complete by:  As directed    Discharge instructions   Complete by:  As directed    You were cared for by  a hospitalist during your hospital stay. If you have any questions about your discharge medications or the care you received while you were in the hospital after you are discharged, you can call the unit and asked to speak with the hospitalist on call if the hospitalist that took care of you is not available. Once you are discharged, your primary care physician will handle any further medical issues. Please note that NO REFILLS for any discharge medications will be authorized  once you are discharged, as it is imperative that you return to your primary care physician (or establish a relationship with a primary care physician if you do not have one) for your aftercare needs so that they can reassess your need for medications and monitor your lab values.   Home infusion instructions [facility] May follow [place] Dosing Protocol; May administer Cathflo as needed to maintain patency of vascular access device.; Flushing of vascular access device: per [facility] Protocol: 0.9% NaCl pre/post medica...   Complete by:  As directed    Instructions:  May follow [place] Dosing Protocol   Instructions:  May administer Cathflo as needed to maintain patency of vascular access device.   Instructions:  Flushing of vascular access device: per [facility] Protocol: 0.9% NaCl pre/post medication administration and prn patency; Heparin 100 u/ml, 72m for implanted ports and Heparin 10u/ml, 536mfor all other central venous catheters.   Instructions:  May follow [facility] Anaphylaxis Protocol for First Dose Administration in the home: 0.9% NaCl at 25-50 ml/hr to maintain IV access for protocol meds. Epinephrine 0.3 ml IV/IM PRN and Benadryl 25-50 IV/IM PRN s/s of anaphylaxis.   Instructions:  [facility] Coordinator (RN) to assist per patient IV care needs in the home PRN.   Check first Vanc trough on Thursday 4/4.  Adjust to maintain trough level of 15-20.   Increase activity slowly   Complete by:  As directed      Allergies as of 07/19/2017      Reactions   Hydromorphone Itching   Oxycodone Itching, Nausea Only      Medication List    STOP taking these medications   trimethoprim 100 MG tablet Commonly known as:  TRIMPEX     TAKE these medications   albuterol 108 (90 Base) MCG/ACT inhaler Commonly known as:  PROVENTIL HFA;VENTOLIN HFA Inhale 2 puffs into the lungs every 6 (six) hours as needed for wheezing or shortness of breath.   atorvastatin 20 MG tablet Commonly  known as:  LIPITOR TAKE 1 TABLET BY MOUTH DAILY What changed:    how much to take  how to take this  when to take this   carbidopa-levodopa 25-100 MG tablet Commonly known as:  SINEMET IR Take 1 tablet by mouth 3 (three) times daily.   colchicine 0.6 MG tablet TAKE 1 TABLET BY MOUTH EVERY 2 HOURS UNTIL RELIEF as needed What changed:    how much to take  how to take this  when to take this  reasons to take this  additional instructions   diazepam 5 MG tablet Commonly known as:  VALIUM Take 2.5-5 mg by mouth daily as needed (neck pain).   diclofenac sodium 1 % Gel Commonly known as:  VOLTAREN Apply 2 g 4 (four) times daily topically. What changed:    when to take this  reasons to take this   febuxostat 40 MG tablet Commonly known as:  ULORIC Take 1 tablet (40 mg total) by mouth daily as  needed. What changed:  reasons to take this   Fish Oil 1000 MG Caps Take 2,000 mg by mouth daily.   Fluticasone-Umeclidin-Vilant 100-62.5-25 MCG/INH Aepb Commonly known as:  TRELEGY ELLIPTA Inhale 1 puff into the lungs daily.   ipratropium-albuterol 0.5-2.5 (3) MG/3ML Soln Commonly known as:  DUONEB Take 3 mLs by nebulization every 6 (six) hours as needed. What changed:  reasons to take this   morphine 15 MG tablet Commonly known as:  MSIR Take 1 tablet (15 mg total) by mouth every 6 (six) hours as needed for severe pain.   multivitamin with minerals Tabs tablet Take 1 tablet by mouth daily.   niacin 500 MG tablet Take 500 mg by mouth daily.   omeprazole 20 MG capsule Commonly known as:  PRILOSEC Take 1 capsule (20 mg total) by mouth every morning. What changed:  when to take this   ondansetron 4 MG disintegrating tablet Commonly known as:  ZOFRAN ODT Take 1 tablet (4 mg total) by mouth every 8 (eight) hours as needed for nausea or vomiting.   predniSONE 10 MG tablet Commonly known as:  DELTASONE Take 2 tabs for 2 days, then 1 tab for 3 days, then 1/2 tab  for 4 days.   PROBIOTIC PO Take 1 capsule by mouth daily.   sertraline 25 MG tablet Commonly known as:  ZOLOFT TAKE 1 TABLET BY MOUTH DAILY What changed:    how much to take  how to take this  when to take this   SYSTANE OP Place 2 drops into both eyes at bedtime as needed (For dry eyes.).   tamsulosin 0.4 MG Caps capsule Commonly known as:  FLOMAX Take 2 capsules (0.8 mg total) by mouth every morning. What changed:    how much to take  how to take this  when to take this   vancomycin IVPB Inject 1,500 mg into the vein daily for 9 days. Indication:  Coag Neg Staphylococcus Sp. Bacteremia and UTI Last Day of Therapy:  4/11 Labs - Sunday/Monday:  CBC/D, BMP, and vancomycin trough. Labs - Thursday:  BMP and vancomycin trough (check first Vanc trough on Thursday 4/4) Labs - Every other week:  ESR and CRP            Home Infusion Instuctions  (From admission, onward)        Start     Ordered   07/19/17 0000  Home infusion instructions [facility] May follow [facility] Dosing Protocol; May administer Cathflo as needed to maintain patency of vascular access device.; Flushing of vascular access device: per [facility] Protocol: 0.9% NaCl pre/post medica...    Question Answer Comment  Instructions May follow [facility] Dosing Protocol   Instructions May administer Cathflo as needed to maintain patency of vascular access device.   Instructions Flushing of vascular access device: per [facility] Protocol: 0.9% NaCl pre/post medication administration and prn patency; Heparin 100 u/ml, 5ml for implanted ports and Heparin 10u/ml, 5ml for all other central venous catheters.   Instructions May follow [facility] Anaphylaxis Protocol for First Dose Administration in the home: 0.9% NaCl at 25-50 ml/hr to maintain IV access for protocol meds. Epinephrine 0.3 ml IV/IM PRN and Benadryl 25-50 IV/IM PRN s/s of anaphylaxis.   Instructions [facility] Infusion Coordinator (RN) to assist per  patient IV care needs in the home PRN.      04 /02/19 1015     Follow-up Information    Ceasar Mons, MD.   Specialty:  Urology Why:  Will call you to schedule surgery in 3 weeks. Contact information: [place] [address] [phone]        Health, [facility]-Home Follow up.   Specialty:  [place] Why:  [facility] nursing-iv abx,supplies,HH physical therapy Contact information: [place] [address] [place], [place], [place]. Schedule an appointment as soon as possible for a visit in 1 week(s).   Specialty:  Family Medicine Contact information: [place] [address] [phone]          Allergies  Allergen Reactions  . Hydromorphone Itching  . Oxycodone Itching and Nausea Only    Consultations:  PCCM admit  Urology    Procedures/Studies: Dg Chest 2 View  Result Date: 07/12/2017 CLINICAL DATA:  Cough and congestion.  Shortness of breath. EXAM: CHEST - 2 VIEW COMPARISON:  CT 02/08/2017, 07/19/2016. FINDINGS: Mediastinum and hilar structures are normal. Hyperexpansion of both lung fields consistent COPD. Changes of pleural-parenchymal scarring with nodularity again noted. No interim change from prior chest CTs. No pleural effusion pneumothorax. Heart size stable. Diffuse degenerative changes osteopenia thoracic spine. IMPRESSION: COPD. Changes of nodular pleural-parenchymal scarring again noted. Chest is stable from prior CT of 02/08/2017 and 07/19/2016. No acute cardiopulmonary disease identified. Electronically Signed   By: Arden   On: 07/12/2017 13:30   Ct Abdomen Pelvis W Contrast  Result Date: 07/15/2017 CLINICAL DATA:  Acute right flank pain. EXAM: CT ABDOMEN AND PELVIS WITH CONTRAST TECHNIQUE: Multidetector CT imaging of the abdomen and pelvis was performed using the standard protocol following bolus administration of  intravenous contrast. CONTRAST:  158m ISOVUE-300 IOPAMIDOL (ISOVUE-300) INJECTION 61% COMPARISON:  04/15/2015 FINDINGS: Lower chest: Bibasilar scarring changes and mild bronchiectasis. No acute pulmonary findings or worrisome pulmonary lesions. The heart is upper limits of normal in size. No pericardial effusion. Coronary artery calcifications are noted along with moderate aortic calcifications. The distal esophagus is grossly normal. There is a small hiatal hernia. Hepatobiliary: No focal hepatic lesions. The gallbladder is surgically absent. Mild associated intra and extrahepatic biliary dilatation. Pancreas: Advanced fatty atrophy. No mass, inflammation or ductal dilatation. Stable moderate-sized duodenum diverticulum near the pancreatic head. Spleen: Normal size.  No focal lesions. Adrenals/Urinary Tract: The adrenal glands are normal. High-grade obstructive findings involving the right kidney with right-sided hydroureteronephrosis down to an obstructing 5 mm right UVJ calculus. Marked perinephric interstitial changes and fluid. There are multiple right-sided renal calculi and a small midpole left renal calculus. There are bilateral cysts. Moderate areas of renal scarring involving the right kidney. The bladder is unremarkable. Stomach/Bowel: The stomach, duodenum, small bowel and colon are grossly normal without oral contrast. No acute inflammatory changes, mass lesions or obstructive findings. Fairly extensive descending colon diverticulosis but no findings for acute diverticulitis. Vascular/Lymphatic: Advanced atherosclerotic calcifications involving the aorta and branch vessels. No mesenteric or retroperitoneal mass or adenopathy. Reproductive: The prostate gland and seminal vesicles are unremarkable. Other: No pelvic mass or adenopathy. No inguinal mass or adenopathy. Small left inguinal hernia containing fat is noted. Musculoskeletal: No significant bony findings. Surgical changes involving the lumbar  spine are noted. IMPRESSION: 1. 5 mm right UVJ calculus causing a high-grade obstruction. 2. Bilateral renal calculi. Electronically Signed   By: PMarijo SanesM.D.   On: 07/15/2017 12:28   Dg Chest Portable 1 View  Result Date: 07/15/2017 CLINICAL DATA:  Short of breath  EXAM: PORTABLE CHEST 1 VIEW COMPARISON:  07/12/2017 CT chest 02/08/2017 FINDINGS: Partially visualized hardware in the cervical spine. Atelectasis and or scarring at the left base and lingula. No acute consolidation or effusion. Stable cardiomediastinal silhouette with aortic atherosclerosis. Emphysema. IMPRESSION: 1. Emphysematous disease. Linear scarring and/or atelectasis at the left base and lingula Electronically Signed   By: Donavan Foil M.D.   On: 07/15/2017 18:33   Dg C-arm 1-60 Min-no Report  Result Date: 07/15/2017 Fluoroscopy was utilized by the requesting physician.  No radiographic interpretation.   [place] Ekg Site Rite  Result Date: 07/19/2017 If Site Rite image not attached, placement could not be confirmed due to current cardiac rhythm.     Discharge Exam: Vitals:   07/19/17 0622 07/19/17 0723  BP: 135/81   Pulse: 70   Resp: 18   Temp: 97.9 F (36.6 C)   SpO2: 93% 91%    General: Pt is alert, awake, not in acute distress Cardiovascular: RRR, S1/S2 +, no rubs, no gallops Respiratory: CTA bilaterally, no wheezing, no rhonchi Abdominal: Soft, NT, ND, bowel sounds + Extremities: no edema, no cyanosis    The results of significant diagnostics from this hospitalization (including imaging, microbiology, ancillary and laboratory) are listed below for reference.     Microbiology: Recent Results (from the past 240 hour(s))  Urine culture     Status: Abnormal   Collection Time: 07/15/17 11:14 AM  Result Value Ref Range Status   Specimen Description   Final    URINE, CLEAN CATCH Performed at [facility], [place] [address], [place], [place] [address]    Special Requests   Final     NONE Performed at [facility], [place] [address] [place], [place] [address]    Culture (A)  Final    >=100,000 COLONIES/mL STAPHYLOCOCCUS SPECIES (COAGULASE NEGATIVE)   Report Status 07/18/2017 FINAL  Final   Organism ID, Bacteria STAPHYLOCOCCUS SPECIES (COAGULASE NEGATIVE) (A)  Final      Susceptibility   Staphylococcus species (coagulase negative) - MIC*    CIPROFLOXACIN >=8 RESISTANT Resistant     GENTAMICIN <=0.5 SENSITIVE Sensitive     NITROFURANTOIN <=16 SENSITIVE Sensitive     OXACILLIN RESISTANT Resistant     TETRACYCLINE >=16 RESISTANT Resistant     VANCOMYCIN 1 SENSITIVE Sensitive     TRIMETH/SULFA 80 RESISTANT Resistant     CLINDAMYCIN <=0.25 SENSITIVE Sensitive     RIFAMPIN <=0.5 SENSITIVE Sensitive     Inducible Clindamycin NEGATIVE Sensitive     * >=100,000 COLONIES/mL STAPHYLOCOCCUS SPECIES (COAGULASE NEGATIVE)  Culture, blood (Routine x 2)     Status: Abnormal   Collection Time: 07/15/17  6:00 PM  Result Value Ref Range Status   Specimen Description BLOOD LEFT HAND  Final   Special Requests   Final    BOTTLES DRAWN AEROBIC AND ANAEROBIC Blood Culture adequate volume   Culture  Setup Time   Final    GRAM POSITIVE COCCI AEROBIC BOTTLE ONLY CRITICAL VALUE NOTED.  VALUE IS CONSISTENT WITH PREVIOUSLY REPORTED AND CALLED VALUE.    Culture (A)  Final    STAPHYLOCOCCUS SPECIES (COAGULASE NEGATIVE) SUSCEPTIBILITIES PERFORMED ON PREVIOUS CULTURE WITHIN THE LAST 5 DAYS. Performed at [facility], [place] [address], [place], [place] [address]    Report Status 07/19/2017 FINAL  Final  Culture, blood (Routine x 2)     Status: Abnormal   Collection Time: 07/15/17  6:10 PM  Result Value Ref Range Status   Specimen Description  BLOOD LEFT ANTECUBITAL  Final   Special Requests   Final    BOTTLES DRAWN AEROBIC AND ANAEROBIC Blood Culture adequate volume   Culture  Setup Time   Final    GRAM POSITIVE COCCI IN CLUSTERS IN BOTH AEROBIC AND ANAEROBIC  BOTTLES CRITICAL RESULT CALLED TO, READ BACK BY AND VERIFIED WITH: T GREEN,PHARMD AT 0831 07/17/17 BY L BENFIELD Performed at [facility], [place] [address] [place], [place] [address]    Culture STAPHYLOCOCCUS SPECIES (COAGULASE NEGATIVE) (A)  Final   Report Status 07/19/2017 FINAL  Final   Organism ID, Bacteria STAPHYLOCOCCUS SPECIES (COAGULASE NEGATIVE)  Final      Susceptibility   Staphylococcus species (coagulase negative) - MIC*    CIPROFLOXACIN >=8 RESISTANT Resistant     ERYTHROMYCIN >=8 RESISTANT Resistant     GENTAMICIN <=0.5 SENSITIVE Sensitive     OXACILLIN >=4 RESISTANT Resistant     TETRACYCLINE >=16 RESISTANT Resistant     VANCOMYCIN <=0.5 SENSITIVE Sensitive     TRIMETH/SULFA 80 RESISTANT Resistant     CLINDAMYCIN <=0.25 SENSITIVE Sensitive     RIFAMPIN <=0.5 SENSITIVE Sensitive     Inducible Clindamycin NEGATIVE Sensitive     * STAPHYLOCOCCUS SPECIES (COAGULASE NEGATIVE)  Blood Culture ID Panel (Reflexed)     Status: Abnormal   Collection Time: 07/15/17  6:10 PM  Result Value Ref Range Status   Enterococcus species NOT DETECTED NOT DETECTED Final   Listeria monocytogenes NOT DETECTED NOT DETECTED Final   Staphylococcus species DETECTED (A) NOT DETECTED Final    Comment: Methicillin (oxacillin) resistant coagulase negative staphylococcus. Possible blood culture contaminant (unless isolated from more than one blood culture draw or clinical case suggests pathogenicity). No antibiotic treatment is indicated for blood  culture contaminants. CRITICAL RESULT CALLED TO, READ BACK BY AND VERIFIED WITH: T GREEN,PHARMD AT 0831 07/17/17 BY L BENFIELD    Staphylococcus aureus NOT DETECTED NOT DETECTED Final   Methicillin resistance DETECTED (A) NOT DETECTED Final    Comment: CRITICAL RESULT CALLED TO, READ BACK BY AND VERIFIED WITH: T GREEN,PHARMD AT 0831 07/17/17 BY L BENFIELD    Streptococcus species NOT DETECTED NOT DETECTED Final   Streptococcus agalactiae NOT DETECTED  NOT DETECTED Final   Streptococcus pneumoniae NOT DETECTED NOT DETECTED Final   Streptococcus pyogenes NOT DETECTED NOT DETECTED Final   Acinetobacter baumannii NOT DETECTED NOT DETECTED Final   Enterobacteriaceae species NOT DETECTED NOT DETECTED Final   Enterobacter cloacae complex NOT DETECTED NOT DETECTED Final   Escherichia coli NOT DETECTED NOT DETECTED Final   Klebsiella oxytoca NOT DETECTED NOT DETECTED Final   Klebsiella pneumoniae NOT DETECTED NOT DETECTED Final   Proteus species NOT DETECTED NOT DETECTED Final   Serratia marcescens NOT DETECTED NOT DETECTED Final   Haemophilus influenzae NOT DETECTED NOT DETECTED Final   Neisseria meningitidis NOT DETECTED NOT DETECTED Final   Pseudomonas aeruginosa NOT DETECTED NOT DETECTED Final   Candida albicans NOT DETECTED NOT DETECTED Final   Candida glabrata NOT DETECTED NOT DETECTED Final   Candida krusei NOT DETECTED NOT DETECTED Final   Candida parapsilosis NOT DETECTED NOT DETECTED Final   Candida tropicalis NOT DETECTED NOT DETECTED Final    Comment: Performed at [facility], [place]. [address], [place],  [address]  Urine Culture     Status: Abnormal   Collection Time: 07/15/17 10:48 PM  Result Value Ref Range Status   Specimen Description   Final    CYSTOSCOPY Performed at [facility],  [place] [address], [place], [place] [address]    Special Requests   Final    NONE Performed at [facility], [place] [address], [place], [place] [address]    Culture (A)  Final    >=100,000 COLONIES/mL STAPHYLOCOCCUS SPECIES (COAGULASE NEGATIVE)   Report Status 07/19/2017 FINAL  Final   Organism ID, Bacteria STAPHYLOCOCCUS SPECIES (COAGULASE NEGATIVE) (A)  Final      Susceptibility   Staphylococcus species (coagulase negative) - MIC*    CIPROFLOXACIN >=8 RESISTANT Resistant     ERYTHROMYCIN >=8 RESISTANT Resistant     GENTAMICIN <=0.5 SENSITIVE Sensitive     OXACILLIN 0.5 RESISTANT Resistant      TETRACYCLINE >=16 RESISTANT Resistant     VANCOMYCIN <=0.5 SENSITIVE Sensitive     CLINDAMYCIN <=0.25 SENSITIVE Sensitive     RIFAMPIN <=0.5 SENSITIVE Sensitive     Inducible Clindamycin NEGATIVE Sensitive     * >=100,000 COLONIES/mL STAPHYLOCOCCUS SPECIES (COAGULASE NEGATIVE)  MRSA PCR Screening     Status: None   Collection Time: 07/16/17  1:02 AM  Result Value Ref Range Status   MRSA by PCR NEGATIVE NEGATIVE Final    Comment:        The GeneXpert MRSA Assay (FDA approved for NASAL specimens only), is one component of a comprehensive MRSA colonization surveillance program. It is not intended to diagnose MRSA infection nor to guide or monitor treatment for MRSA infections. Performed at [facility], [place] [address], [place], [place] [address]   Culture, expectorated sputum-assessment     Status: None   Collection Time: 07/16/17  1:24 AM  Result Value Ref Range Status   Specimen Description SPUTUM  Final   Special Requests NONE  Final   Sputum evaluation   Final    THIS SPECIMEN IS ACCEPTABLE FOR SPUTUM CULTURE Performed at [facility], [place] [address], [place], [place] [address]    Report Status 07/16/2017 FINAL  Final  Culture, respiratory (NON-Expectorated)     Status: None   Collection Time: 07/16/17  1:24 AM  Result Value Ref Range Status   Specimen Description   Final    SPUTUM Performed at [place] [address] [place], [place] [address]    Special Requests   Final    NONE Reflexed from [id number] Performed at [facility], [place] [address], [place], [place] [address]    Gram Stain   Final    MODERATE WBC PRESENT, PREDOMINANTLY PMN RARE SQUAMOUS EPITHELIAL CELLS PRESENT FEW GRAM POSITIVE COCCI IN CHAINS FEW BUDDING YEAST SEEN    Culture   Final    Consistent with normal respiratory flora. Performed at [facility], [place] [address], [place], [place] [address]    Report Status  07/19/2017 FINAL  Final  Culture, blood (routine x 2)     Status: None (Preliminary result)   Collection Time: 07/18/17  1:15 PM  Result Value Ref Range Status   Specimen Description   Final    BLOOD LEFT ANTECUBITAL Performed at [place] [address] [place], [place] [address]    Special Requests   Final    BOTTLES DRAWN AEROBIC AND ANAEROBIC Blood Culture adequate volume Performed at [place] [address] [place],  [address]    Culture   Final    NO GROWTH < 24 HOURS Performed at [place] [address], [place],  [address]    Report  Status PENDING  Incomplete  Culture, blood (routine x 2)     Status: None (Preliminary result)   Collection Time: 07/18/17  1:17 PM  Result Value Ref Range Status   Specimen Description   Final    BLOOD LEFT HAND Performed at [place] [address], [place], [place] [address]    Special Requests   Final    BOTTLES DRAWN AEROBIC ONLY Blood Culture adequate volume Performed at [facility] [address], [place], [place] [address]    Culture   Final    NO GROWTH < 24 HOURS Performed at [facility] [address], [place],  [address]    Report Status PENDING  Incomplete     Labs: BNP (last 3 results) Recent Labs    07/15/17 2013  BNP 564.3*   Basic Metabolic Panel: Recent Labs  Lab 07/15/17 1758 07/15/17 2352 07/16/17 0206 07/17/17 0332 07/18/17 0514 07/19/17 0534  NA 138 140 140 141 139 140  K 4.1 3.9 4.0 3.7 3.8 3.6  CL 106  --  111 112* 107 107  CO2 19*  --  21* 24 25 26   GLUCOSE 172*  --  132* 93 127* 109*  BUN 27*  --  28* 20 17 17   CREATININE 1.33*  --  1.16 0.80 0.73 0.69  CALCIUM 8.6*  --  7.8* 8.0* 8.2* 8.3*  MG  --   --  1.6* 2.3 1.8  --   PHOS  --   --  3.4 2.7 2.4*  --    Liver Function Tests: Recent Labs  Lab 07/15/17 1043 07/15/17 1758  AST 30 56*  ALT 23 36  ALKPHOS  108 112  BILITOT 0.8 0.7  PROT 7.6 6.2*  ALBUMIN 3.8 3.0*   Recent Labs  Lab 07/15/17 1043  LIPASE 16   No results for input(s): AMMONIA in the last 168 hours. CBC: Recent Labs  Lab 07/15/17 1043 07/15/17 1758 07/15/17 2352 07/16/17 0206 07/17/17 0332 07/18/17 0514 07/19/17 0534  WBC 16.0* 16.2*  --  31.1* 16.5* 13.3* 12.4*  NEUTROABS 13.2* 15.2*  --   --   --   --   --   HGB 13.8 12.4* 10.2* 10.8* 10.2* 10.3* 10.9*  HCT 41.2 38.1* 30.0* 33.5* 31.7* 31.3* 33.2*  MCV 92.2 91.8  --  94.6 93.8 92.3 92.5  PLT 256 219  --  212 162 168 172   Cardiac Enzymes: No results for input(s): CKTOTAL, CKMB, CKMBINDEX, TROPONINI in the last 168 hours. BNP: Invalid input(s): POCBNP CBG: Recent Labs  Lab 07/18/17 0419 07/18/17 0718 07/18/17 1133 07/18/17 1646 07/18/17 2006  GLUCAP 117* 118* 160* 153* 177*   D-Dimer No results for input(s): DDIMER in the last 72 hours. Hgb A1c No results for input(s): HGBA1C in the last 72 hours. Lipid Profile No results for input(s): CHOL, HDL, LDLCALC, TRIG, CHOLHDL, LDLDIRECT in the last 72 hours. Thyroid function studies No results for input(s): TSH, T4TOTAL, T3FREE, THYROIDAB in the last 72 hours.  Invalid input(s): FREET3 Anemia work up No results for input(s): VITAMINB12, FOLATE, FERRITIN, TIBC, IRON, RETICCTPCT in the last 72 hours. Urinalysis    Component Value Date/Time   COLORURINE YELLOW 07/15/2017 1114   APPEARANCEUR HAZY (A) 07/15/2017 1114   LABSPEC 1.017 07/15/2017 1114   PHURINE 6.0 07/15/2017 1114   GLUCOSEU NEGATIVE 07/15/2017 1114   GLUCOSEU NEGATIVE 01/21/2015 1437   HGBUR MODERATE (A) 07/15/2017 1114   BILIRUBINUR NEGATIVE 07/15/2017 1114  BILIRUBINUR neg 03/08/2016 1702   KETONESUR NEGATIVE 07/15/2017 1114   PROTEINUR NEGATIVE 07/15/2017 1114   UROBILINOGEN 0.2 03/08/2016 1702   UROBILINOGEN 0.2 01/21/2015 1437   NITRITE NEGATIVE 07/15/2017 1114   LEUKOCYTESUR MODERATE (A) 07/15/2017 1114   Sepsis  Labs Invalid input(s): PROCALCITONIN,  WBC,  LACTICIDVEN Microbiology Recent Results (from the past 240 hour(s))  Urine culture     Status: Abnormal   Collection Time: 07/15/17 11:14 AM  Result Value Ref Range Status   Specimen Description   Final    URINE, CLEAN CATCH Performed at [facility], [place] [address], [place], [place] [address]    Special Requests   Final    NONE Performed at [facility], [place] [address], [place], [place] [address]    Culture (A)  Final    >=100,000 COLONIES/mL STAPHYLOCOCCUS SPECIES (COAGULASE NEGATIVE)   Report Status 07/18/2017 FINAL  Final   Organism ID, Bacteria STAPHYLOCOCCUS SPECIES (COAGULASE NEGATIVE) (A)  Final      Susceptibility   Staphylococcus species (coagulase negative) - MIC*    CIPROFLOXACIN >=8 RESISTANT Resistant     GENTAMICIN <=0.5 SENSITIVE Sensitive     NITROFURANTOIN <=16 SENSITIVE Sensitive     OXACILLIN RESISTANT Resistant     TETRACYCLINE >=16 RESISTANT Resistant     VANCOMYCIN 1 SENSITIVE Sensitive     TRIMETH/SULFA 80 RESISTANT Resistant     CLINDAMYCIN <=0.25 SENSITIVE Sensitive     RIFAMPIN <=0.5 SENSITIVE Sensitive     Inducible Clindamycin NEGATIVE Sensitive     * >=100,000 COLONIES/mL STAPHYLOCOCCUS SPECIES (COAGULASE NEGATIVE)  Culture, blood (Routine x 2)     Status: Abnormal   Collection Time: 07/15/17  6:00 PM  Result Value Ref Range Status   Specimen Description BLOOD LEFT HAND  Final   Special Requests   Final    BOTTLES DRAWN AEROBIC AND ANAEROBIC Blood Culture adequate volume   Culture  Setup Time   Final    GRAM POSITIVE COCCI AEROBIC BOTTLE ONLY CRITICAL VALUE NOTED.  VALUE IS CONSISTENT WITH PREVIOUSLY REPORTED AND CALLED VALUE.    Culture (A)  Final    STAPHYLOCOCCUS SPECIES (COAGULASE NEGATIVE) SUSCEPTIBILITIES PERFORMED ON PREVIOUS CULTURE WITHIN THE LAST 5 DAYS. Performed at [facility], [place] [address], [place], [place] [address]    Report Status  07/19/2017 FINAL  Final  Culture, blood (Routine x 2)     Status: Abnormal   Collection Time: 07/15/17  6:10 PM  Result Value Ref Range Status   Specimen Description BLOOD LEFT ANTECUBITAL  Final   Special Requests   Final    BOTTLES DRAWN AEROBIC AND ANAEROBIC Blood Culture adequate volume   Culture  Setup Time   Final    GRAM POSITIVE COCCI IN CLUSTERS IN BOTH AEROBIC AND ANAEROBIC BOTTLES CRITICAL RESULT CALLED TO, READ BACK BY AND VERIFIED WITH: T GREEN,PHARMD AT 0831 07/17/17 BY L BENFIELD Performed at [facility], [place] [address], [place], [place] [address]    Culture STAPHYLOCOCCUS SPECIES (COAGULASE NEGATIVE) (A)  Final   Report Status 07/19/2017 FINAL  Final   Organism ID, Bacteria STAPHYLOCOCCUS SPECIES (COAGULASE NEGATIVE)  Final      Susceptibility   Staphylococcus species (coagulase negative) - MIC*    CIPROFLOXACIN >=8 RESISTANT Resistant     ERYTHROMYCIN >=8 RESISTANT Resistant     GENTAMICIN <=0.5 SENSITIVE Sensitive     OXACILLIN >=4 RESISTANT Resistant     TETRACYCLINE >=16 RESISTANT Resistant  VANCOMYCIN <=0.5 SENSITIVE Sensitive     TRIMETH/SULFA 80 RESISTANT Resistant     CLINDAMYCIN <=0.25 SENSITIVE Sensitive     RIFAMPIN <=0.5 SENSITIVE Sensitive     Inducible Clindamycin NEGATIVE Sensitive     * STAPHYLOCOCCUS SPECIES (COAGULASE NEGATIVE)  Blood Culture ID Panel (Reflexed)     Status: Abnormal   Collection Time: 07/15/17  6:10 PM  Result Value Ref Range Status   Enterococcus species NOT DETECTED NOT DETECTED Final   Listeria monocytogenes NOT DETECTED NOT DETECTED Final   Staphylococcus species DETECTED (A) NOT DETECTED Final    Comment: Methicillin (oxacillin) resistant coagulase negative staphylococcus. Possible blood culture contaminant (unless isolated from more than one blood culture draw or clinical case suggests pathogenicity). No antibiotic treatment is indicated for blood  culture contaminants. CRITICAL RESULT CALLED TO, READ BACK BY AND  VERIFIED WITH: T GREEN,PHARMD AT 0831 07/17/17 BY L BENFIELD    Staphylococcus aureus NOT DETECTED NOT DETECTED Final   Methicillin resistance DETECTED (A) NOT DETECTED Final    Comment: CRITICAL RESULT CALLED TO, READ BACK BY AND VERIFIED WITH: T GREEN,PHARMD AT 0831 07/17/17 BY L BENFIELD    Streptococcus species NOT DETECTED NOT DETECTED Final   Streptococcus agalactiae NOT DETECTED NOT DETECTED Final   Streptococcus pneumoniae NOT DETECTED NOT DETECTED Final   Streptococcus pyogenes NOT DETECTED NOT DETECTED Final   Acinetobacter baumannii NOT DETECTED NOT DETECTED Final   Enterobacteriaceae species NOT DETECTED NOT DETECTED Final   Enterobacter cloacae complex NOT DETECTED NOT DETECTED Final   Escherichia coli NOT DETECTED NOT DETECTED Final   Klebsiella oxytoca NOT DETECTED NOT DETECTED Final   Klebsiella pneumoniae NOT DETECTED NOT DETECTED Final   Proteus species NOT DETECTED NOT DETECTED Final   Serratia marcescens NOT DETECTED NOT DETECTED Final   Haemophilus influenzae NOT DETECTED NOT DETECTED Final   Neisseria meningitidis NOT DETECTED NOT DETECTED Final   Pseudomonas aeruginosa NOT DETECTED NOT DETECTED Final   Candida albicans NOT DETECTED NOT DETECTED Final   Candida glabrata NOT DETECTED NOT DETECTED Final   Candida krusei NOT DETECTED NOT DETECTED Final   Candida parapsilosis NOT DETECTED NOT DETECTED Final   Candida tropicalis NOT DETECTED NOT DETECTED Final    Comment: Performed at [facility], [place]. [address] [place], [place] [address]  Urine Culture     Status: Abnormal   Collection Time: 07/15/17 10:48 PM  Result Value Ref Range Status   Specimen Description   Final    CYSTOSCOPY Performed at [facility] [address], [place], [place] [address]    Special Requests   Final    NONE Performed at [facility], [place] [address], [place], [place] [address]    Culture (A)  Final    >=100,000 COLONIES/mL  STAPHYLOCOCCUS SPECIES (COAGULASE NEGATIVE)   Report Status 07/19/2017 FINAL  Final   Organism ID, Bacteria STAPHYLOCOCCUS SPECIES (COAGULASE NEGATIVE) (A)  Final      Susceptibility   Staphylococcus species (coagulase negative) - MIC*    CIPROFLOXACIN >=8 RESISTANT Resistant     ERYTHROMYCIN >=8 RESISTANT Resistant     GENTAMICIN <=0.5 SENSITIVE Sensitive     OXACILLIN 0.5 RESISTANT Resistant     TETRACYCLINE >=16 RESISTANT Resistant     VANCOMYCIN <=0.5 SENSITIVE Sensitive     CLINDAMYCIN <=0.25 SENSITIVE Sensitive     RIFAMPIN <=0.5 SENSITIVE Sensitive     Inducible Clindamycin NEGATIVE Sensitive     * >=100,000 COLONIES/mL STAPHYLOCOCCUS SPECIES (COAGULASE NEGATIVE)  MRSA PCR Screening     Status: None   Collection Time: 07/16/17  1:02 AM  Result Value Ref Range Status   MRSA by PCR NEGATIVE NEGATIVE Final    Comment:        The GeneXpert MRSA Assay (FDA approved for NASAL specimens only), is one component of a comprehensive MRSA colonization surveillance program. It is not intended to diagnose MRSA infection nor to guide or monitor treatment for MRSA infections. Performed at [facility], [place] [address] [place], [place] [address]   Culture, expectorated sputum-assessment     Status: None   Collection Time: 07/16/17  1:24 AM  Result Value Ref Range Status   Specimen Description SPUTUM  Final   Special Requests NONE  Final   Sputum evaluation   Final    THIS SPECIMEN IS ACCEPTABLE FOR SPUTUM CULTURE Performed at [facility], [place] [address] [place], [place] [address]    Report Status 07/16/2017 FINAL  Final  Culture, respiratory (NON-Expectorated)     Status: None   Collection Time: 07/16/17  1:24 AM  Result Value Ref Range Status   Specimen Description   Final    SPUTUM Performed at [facility] [address] [place], [place] [address]    Special Requests   Final    NONE Reflexed from [id number] Performed  at [facility], [place] [address], [place], [place] [address]    Gram Stain   Final    MODERATE WBC PRESENT, PREDOMINANTLY PMN RARE SQUAMOUS EPITHELIAL CELLS PRESENT FEW GRAM POSITIVE COCCI IN CHAINS FEW BUDDING YEAST SEEN    Culture   Final    Consistent with normal respiratory flora. Performed at [facility], [place] [address] [place], [place] [address]    Report Status 07/19/2017 FINAL  Final  Culture, blood (routine x 2)     Status: None (Preliminary result)   Collection Time: 07/18/17  1:15 PM  Result Value Ref Range Status   Specimen Description   Final    BLOOD LEFT ANTECUBITAL Performed at [place] [address] [place], [place] [address]    Special Requests   Final    BOTTLES DRAWN AEROBIC AND ANAEROBIC Blood Culture adequate volume Performed at [facility] [address] [place], [place] [address]    Culture   Final    NO GROWTH < 24 HOURS Performed at [place] [address], [place], [place] [address]    Report Status PENDING  Incomplete  Culture, blood (routine x 2)     Status: None (Preliminary result)   Collection Time: 07/18/17  1:17 PM  Result Value Ref Range Status   Specimen Description   Final    BLOOD LEFT HAND Performed at [facility] [address] [place], [place] [address]    Special Requests   Final    BOTTLES DRAWN AEROBIC ONLY Blood Culture adequate volume Performed at [facility] [address], [place], [place] [address]    Culture   Final    NO GROWTH < 24 HOURS Performed at [place] [address] [place],  [address]    Report Status PENDING  Incomplete     Patient was seen and examined on the day of discharge and was found to be in stable condition. Time coordinating discharge: 35 minutes including assessment and coordination of care, as well as examination of the patient.  SIGNED:  Dessa Phi, DO [facility] Pager [phone]  If 7PM-7AM, please contact night-coverage [email or web address] Password [facility] 07/19/2017, 10:25 AM

## 2017-07-20 ENCOUNTER — Other Ambulatory Visit: Payer: Self-pay | Admitting: Urology

## 2017-07-20 ENCOUNTER — Telehealth: Payer: Self-pay | Admitting: Pulmonary Disease

## 2017-07-20 DIAGNOSIS — A411 Sepsis due to other specified staphylococcus: Secondary | ICD-10-CM | POA: Diagnosis not present

## 2017-07-20 DIAGNOSIS — N390 Urinary tract infection, site not specified: Secondary | ICD-10-CM | POA: Diagnosis not present

## 2017-07-20 DIAGNOSIS — J42 Unspecified chronic bronchitis: Secondary | ICD-10-CM | POA: Diagnosis not present

## 2017-07-20 DIAGNOSIS — A419 Sepsis, unspecified organism: Secondary | ICD-10-CM | POA: Diagnosis not present

## 2017-07-20 DIAGNOSIS — M48061 Spinal stenosis, lumbar region without neurogenic claudication: Secondary | ICD-10-CM | POA: Diagnosis not present

## 2017-07-20 DIAGNOSIS — M5137 Other intervertebral disc degeneration, lumbosacral region: Secondary | ICD-10-CM | POA: Diagnosis not present

## 2017-07-20 MED ORDER — FLUTICASONE-UMECLIDIN-VILANT 100-62.5-25 MCG/INH IN AEPB: 1.0000 | INHALATION_SPRAY | Freq: Every day | RESPIRATORY_TRACT | 6 refills | Status: DC

## 2017-07-20 NOTE — Telephone Encounter (Signed)
Called and spoke with patients wife, patient is almost finished with his sample of Trelegy. It has been working well so he is ready for the prescription. Verified pharmacy and medication was sent. Nothing further needed.

## 2017-07-21 ENCOUNTER — Telehealth: Payer: Self-pay | Admitting: *Deleted

## 2017-07-21 NOTE — Telephone Encounter (Signed)
No answer

## 2017-07-22 DIAGNOSIS — M4802 Spinal stenosis, cervical region: Secondary | ICD-10-CM | POA: Diagnosis not present

## 2017-07-22 DIAGNOSIS — J479 Bronchiectasis, uncomplicated: Secondary | ICD-10-CM | POA: Diagnosis not present

## 2017-07-22 DIAGNOSIS — I700 Atherosclerosis of aorta: Secondary | ICD-10-CM | POA: Diagnosis not present

## 2017-07-22 DIAGNOSIS — R338 Other retention of urine: Secondary | ICD-10-CM | POA: Diagnosis not present

## 2017-07-22 DIAGNOSIS — B957 Other staphylococcus as the cause of diseases classified elsewhere: Secondary | ICD-10-CM | POA: Diagnosis not present

## 2017-07-22 DIAGNOSIS — N401 Enlarged prostate with lower urinary tract symptoms: Secondary | ICD-10-CM | POA: Diagnosis not present

## 2017-07-22 DIAGNOSIS — M1990 Unspecified osteoarthritis, unspecified site: Secondary | ICD-10-CM | POA: Diagnosis not present

## 2017-07-22 DIAGNOSIS — M1A9XX0 Chronic gout, unspecified, without tophus (tophi): Secondary | ICD-10-CM | POA: Diagnosis not present

## 2017-07-22 DIAGNOSIS — G8929 Other chronic pain: Secondary | ICD-10-CM | POA: Diagnosis not present

## 2017-07-22 DIAGNOSIS — Z7689 Persons encountering health services in other specified circumstances: Secondary | ICD-10-CM | POA: Diagnosis not present

## 2017-07-22 DIAGNOSIS — E785 Hyperlipidemia, unspecified: Secondary | ICD-10-CM | POA: Diagnosis not present

## 2017-07-22 DIAGNOSIS — A411 Sepsis due to other specified staphylococcus: Secondary | ICD-10-CM | POA: Diagnosis not present

## 2017-07-22 DIAGNOSIS — J439 Emphysema, unspecified: Secondary | ICD-10-CM | POA: Diagnosis not present

## 2017-07-22 DIAGNOSIS — R6521 Severe sepsis with septic shock: Secondary | ICD-10-CM | POA: Diagnosis not present

## 2017-07-22 DIAGNOSIS — I5030 Unspecified diastolic (congestive) heart failure: Secondary | ICD-10-CM | POA: Diagnosis not present

## 2017-07-22 DIAGNOSIS — I2510 Atherosclerotic heart disease of native coronary artery without angina pectoris: Secondary | ICD-10-CM | POA: Diagnosis not present

## 2017-07-22 DIAGNOSIS — K219 Gastro-esophageal reflux disease without esophagitis: Secondary | ICD-10-CM | POA: Diagnosis not present

## 2017-07-22 DIAGNOSIS — N390 Urinary tract infection, site not specified: Secondary | ICD-10-CM | POA: Diagnosis not present

## 2017-07-22 DIAGNOSIS — J9601 Acute respiratory failure with hypoxia: Secondary | ICD-10-CM | POA: Diagnosis not present

## 2017-07-22 DIAGNOSIS — R918 Other nonspecific abnormal finding of lung field: Secondary | ICD-10-CM | POA: Diagnosis not present

## 2017-07-22 DIAGNOSIS — M8580 Other specified disorders of bone density and structure, unspecified site: Secondary | ICD-10-CM | POA: Diagnosis not present

## 2017-07-22 NOTE — Telephone Encounter (Signed)
No answer

## 2017-07-23 LAB — CULTURE, BLOOD (ROUTINE X 2)
CULTURE: NO GROWTH
Culture: NO GROWTH
SPECIAL REQUESTS: ADEQUATE
SPECIAL REQUESTS: ADEQUATE

## 2017-07-24 DIAGNOSIS — J42 Unspecified chronic bronchitis: Secondary | ICD-10-CM | POA: Diagnosis not present

## 2017-07-24 DIAGNOSIS — M5137 Other intervertebral disc degeneration, lumbosacral region: Secondary | ICD-10-CM | POA: Diagnosis not present

## 2017-07-24 DIAGNOSIS — M48061 Spinal stenosis, lumbar region without neurogenic claudication: Secondary | ICD-10-CM | POA: Diagnosis not present

## 2017-07-24 DIAGNOSIS — N390 Urinary tract infection, site not specified: Secondary | ICD-10-CM | POA: Diagnosis not present

## 2017-07-24 DIAGNOSIS — A419 Sepsis, unspecified organism: Secondary | ICD-10-CM | POA: Diagnosis not present

## 2017-07-25 DIAGNOSIS — G8929 Other chronic pain: Secondary | ICD-10-CM | POA: Diagnosis not present

## 2017-07-25 DIAGNOSIS — I5030 Unspecified diastolic (congestive) heart failure: Secondary | ICD-10-CM | POA: Diagnosis not present

## 2017-07-25 DIAGNOSIS — M8580 Other specified disorders of bone density and structure, unspecified site: Secondary | ICD-10-CM | POA: Diagnosis not present

## 2017-07-25 DIAGNOSIS — R918 Other nonspecific abnormal finding of lung field: Secondary | ICD-10-CM | POA: Diagnosis not present

## 2017-07-25 DIAGNOSIS — B957 Other staphylococcus as the cause of diseases classified elsewhere: Secondary | ICD-10-CM | POA: Diagnosis not present

## 2017-07-25 DIAGNOSIS — Z7689 Persons encountering health services in other specified circumstances: Secondary | ICD-10-CM | POA: Diagnosis not present

## 2017-07-25 DIAGNOSIS — N401 Enlarged prostate with lower urinary tract symptoms: Secondary | ICD-10-CM | POA: Diagnosis not present

## 2017-07-25 DIAGNOSIS — K219 Gastro-esophageal reflux disease without esophagitis: Secondary | ICD-10-CM | POA: Diagnosis not present

## 2017-07-25 DIAGNOSIS — M4802 Spinal stenosis, cervical region: Secondary | ICD-10-CM | POA: Diagnosis not present

## 2017-07-25 DIAGNOSIS — J9601 Acute respiratory failure with hypoxia: Secondary | ICD-10-CM | POA: Diagnosis not present

## 2017-07-25 DIAGNOSIS — R6521 Severe sepsis with septic shock: Secondary | ICD-10-CM | POA: Diagnosis not present

## 2017-07-25 DIAGNOSIS — E785 Hyperlipidemia, unspecified: Secondary | ICD-10-CM | POA: Diagnosis not present

## 2017-07-25 DIAGNOSIS — M1990 Unspecified osteoarthritis, unspecified site: Secondary | ICD-10-CM | POA: Diagnosis not present

## 2017-07-25 DIAGNOSIS — R338 Other retention of urine: Secondary | ICD-10-CM | POA: Diagnosis not present

## 2017-07-25 DIAGNOSIS — I2510 Atherosclerotic heart disease of native coronary artery without angina pectoris: Secondary | ICD-10-CM | POA: Diagnosis not present

## 2017-07-25 DIAGNOSIS — J439 Emphysema, unspecified: Secondary | ICD-10-CM | POA: Diagnosis not present

## 2017-07-25 DIAGNOSIS — A411 Sepsis due to other specified staphylococcus: Secondary | ICD-10-CM | POA: Diagnosis not present

## 2017-07-25 DIAGNOSIS — M1A9XX0 Chronic gout, unspecified, without tophus (tophi): Secondary | ICD-10-CM | POA: Diagnosis not present

## 2017-07-25 DIAGNOSIS — N390 Urinary tract infection, site not specified: Secondary | ICD-10-CM | POA: Diagnosis not present

## 2017-07-25 DIAGNOSIS — I700 Atherosclerosis of aorta: Secondary | ICD-10-CM | POA: Diagnosis not present

## 2017-07-25 DIAGNOSIS — J479 Bronchiectasis, uncomplicated: Secondary | ICD-10-CM | POA: Diagnosis not present

## 2017-07-25 NOTE — Telephone Encounter (Signed)
No answer

## 2017-07-26 ENCOUNTER — Telehealth: Payer: Self-pay | Admitting: Family Medicine

## 2017-07-26 NOTE — Telephone Encounter (Signed)
No answer

## 2017-07-26 NOTE — Telephone Encounter (Signed)
Copied from CRM [id number]. Topic: Quick Communication - See Telephone Encounter >> Jul 26, 2017 10:49 AM Landry MellowFoltz, Melissa J wrote: CRM for notification. See Telephone encounter for: 07/26/17. [facility] with [facility] called -  Verbal orders for PT for eval  Nursing -  2 week 3 1 week 6 OT - eval   Cb is [phone] Please leave message if no answer, confidential mailbox

## 2017-07-27 ENCOUNTER — Inpatient Hospital Stay: Payer: Medicare Other | Admitting: Family Medicine

## 2017-07-27 NOTE — Telephone Encounter (Signed)
Copied from CRM [phone]. Topic: Quick Communication - See Telephone Encounter >> Jul 26, 2017 10:49 AM Landry MellowFoltz, Melissa J wrote: CRM for notification. See Telephone encounter for: 07/26/17. [facility] with [facility] called -  Verbal orders for PT for eval  Nursing -  2 week 3 1 week 6 OT - eval   Cb is [phone] Please leave message if no answer, confidential mailbox  >> Jul 27, 2017  2:47 PM Terisa Starraylor, Brittany L wrote: Lawanna Kobusngel said she is so sorry that her voicemail was full. She said to please call back. Called Angel back and left detailed ok per PCP.

## 2017-07-27 NOTE — Telephone Encounter (Signed)
Called the HHRN/no answer/and mailbox was full

## 2017-07-27 NOTE — Telephone Encounter (Signed)
Called the number, no answer/and voice mail box was full.

## 2017-07-27 NOTE — Telephone Encounter (Signed)
Called left detailed message of PCP ok for request. 

## 2017-07-28 ENCOUNTER — Telehealth: Payer: Self-pay | Admitting: Family Medicine

## 2017-07-28 DIAGNOSIS — K219 Gastro-esophageal reflux disease without esophagitis: Secondary | ICD-10-CM | POA: Diagnosis not present

## 2017-07-28 DIAGNOSIS — A411 Sepsis due to other specified staphylococcus: Secondary | ICD-10-CM | POA: Diagnosis not present

## 2017-07-28 DIAGNOSIS — M1990 Unspecified osteoarthritis, unspecified site: Secondary | ICD-10-CM | POA: Diagnosis not present

## 2017-07-28 DIAGNOSIS — E785 Hyperlipidemia, unspecified: Secondary | ICD-10-CM | POA: Diagnosis not present

## 2017-07-28 DIAGNOSIS — M8580 Other specified disorders of bone density and structure, unspecified site: Secondary | ICD-10-CM | POA: Diagnosis not present

## 2017-07-28 DIAGNOSIS — I700 Atherosclerosis of aorta: Secondary | ICD-10-CM | POA: Diagnosis not present

## 2017-07-28 DIAGNOSIS — I2510 Atherosclerotic heart disease of native coronary artery without angina pectoris: Secondary | ICD-10-CM | POA: Diagnosis not present

## 2017-07-28 DIAGNOSIS — R918 Other nonspecific abnormal finding of lung field: Secondary | ICD-10-CM | POA: Diagnosis not present

## 2017-07-28 DIAGNOSIS — M1A9XX0 Chronic gout, unspecified, without tophus (tophi): Secondary | ICD-10-CM | POA: Diagnosis not present

## 2017-07-28 DIAGNOSIS — G8929 Other chronic pain: Secondary | ICD-10-CM | POA: Diagnosis not present

## 2017-07-28 DIAGNOSIS — M4802 Spinal stenosis, cervical region: Secondary | ICD-10-CM | POA: Diagnosis not present

## 2017-07-28 DIAGNOSIS — Z7689 Persons encountering health services in other specified circumstances: Secondary | ICD-10-CM | POA: Diagnosis not present

## 2017-07-28 DIAGNOSIS — J439 Emphysema, unspecified: Secondary | ICD-10-CM | POA: Diagnosis not present

## 2017-07-28 DIAGNOSIS — J479 Bronchiectasis, uncomplicated: Secondary | ICD-10-CM | POA: Diagnosis not present

## 2017-07-28 DIAGNOSIS — B957 Other staphylococcus as the cause of diseases classified elsewhere: Secondary | ICD-10-CM | POA: Diagnosis not present

## 2017-07-28 DIAGNOSIS — R6521 Severe sepsis with septic shock: Secondary | ICD-10-CM | POA: Diagnosis not present

## 2017-07-28 DIAGNOSIS — I5030 Unspecified diastolic (congestive) heart failure: Secondary | ICD-10-CM | POA: Diagnosis not present

## 2017-07-28 DIAGNOSIS — J9601 Acute respiratory failure with hypoxia: Secondary | ICD-10-CM | POA: Diagnosis not present

## 2017-07-28 DIAGNOSIS — R338 Other retention of urine: Secondary | ICD-10-CM | POA: Diagnosis not present

## 2017-07-28 DIAGNOSIS — N390 Urinary tract infection, site not specified: Secondary | ICD-10-CM | POA: Diagnosis not present

## 2017-07-28 DIAGNOSIS — N401 Enlarged prostate with lower urinary tract symptoms: Secondary | ICD-10-CM | POA: Diagnosis not present

## 2017-07-28 NOTE — Telephone Encounter (Signed)
Pr scheduled for tomorrow.

## 2017-07-28 NOTE — Telephone Encounter (Signed)
I would like him evaluated. If he cannot wait until tomorrow, he should go to ER.

## 2017-07-28 NOTE — Telephone Encounter (Signed)
Copied from CRM [id number]. Topic: General - Other >> Jul 28, 2017  1:50 PM Percival SpanishKennedy, Cheryl W wrote:  Lawanna KobusAngel with [facility] call to say pt is having some bilaterial swelling  in legs and feet ,cough lungs wheezing, burning and urgency urine is clear.  Would like a call back as to what the doctor would like to do   [phone]

## 2017-07-29 ENCOUNTER — Encounter: Payer: Self-pay | Admitting: Family Medicine

## 2017-07-29 ENCOUNTER — Ambulatory Visit: Payer: Medicare Other | Admitting: Family Medicine

## 2017-07-29 VITALS — BP 122/80 | HR 69 | Temp 98.1°F | Ht 66.0 in | Wt 223.0 lb

## 2017-07-29 DIAGNOSIS — I5032 Chronic diastolic (congestive) heart failure: Secondary | ICD-10-CM

## 2017-07-29 DIAGNOSIS — A419 Sepsis, unspecified organism: Secondary | ICD-10-CM | POA: Diagnosis not present

## 2017-07-29 DIAGNOSIS — L89151 Pressure ulcer of sacral region, stage 1: Secondary | ICD-10-CM

## 2017-07-29 LAB — CBC
HEMATOCRIT: 33.8 % — AB (ref 38.5–50.0)
HEMOGLOBIN: 11.5 g/dL — AB (ref 13.2–17.1)
MCH: 30.3 pg (ref 27.0–33.0)
MCHC: 34 g/dL (ref 32.0–36.0)
MCV: 89.2 fL (ref 80.0–100.0)
MPV: 10.3 fL (ref 7.5–12.5)
PLATELETS: 249 10*3/uL (ref 140–400)
RBC: 3.79 10*6/uL — AB (ref 4.20–5.80)
RDW: 13.7 % (ref 11.0–15.0)
WBC: 9 10*3/uL (ref 3.8–10.8)

## 2017-07-29 LAB — BASIC METABOLIC PANEL
BUN: 24 mg/dL (ref 7–25)
CO2: 25 mmol/L (ref 20–32)
CREATININE: 0.76 mg/dL (ref 0.70–1.18)
Calcium: 8.6 mg/dL (ref 8.6–10.3)
Chloride: 107 mmol/L (ref 98–110)
GLUCOSE: 88 mg/dL (ref 65–99)
Potassium: 4 mmol/L (ref 3.5–5.3)
SODIUM: 142 mmol/L (ref 135–146)

## 2017-07-29 MED ORDER — POTASSIUM CHLORIDE ER 10 MEQ PO TBCR: 20.0000 meq | EXTENDED_RELEASE_TABLET | Freq: Every day | ORAL | 1 refills | Status: DC

## 2017-07-29 MED ORDER — FUROSEMIDE 20 MG PO TABS: 40.0000 mg | ORAL_TABLET | Freq: Every day | ORAL | 1 refills | Status: DC

## 2017-07-29 NOTE — Progress Notes (Signed)
Chief Complaint  Patient presents with  . Hospitalization Follow-up  . Leg Swelling    HPI Evan [name] is a 77 y.o. y.o. male who presents for a transition of care visit.  Pt was discharged from [facility] on 07/19/17.  Within 48 business hours of discharge our office contacted him via telephone to coordinate his care and needs. Here w wife.   Patient was admitted for a right sided septic stone.  He was hypotensive and required a short stay in the intensive care unit.  He has stent placement and received antibiotics.  The infectious disease team recommended a total of 14 days of IV vancomycin, a PICC line was placed that is still present.  The home health team wanted to make sure that he no longer requires any intravenous medicine.  The patient feels much better.  His wife's and his greatest concern is an area of irritation on his buttocks.  He does not ambulate on his own and utilizes a wheelchair.  No fevers or drainage, home health has been using a sacral pad that has helped significantly.  They do have a donut cushion at home but has not been using it.  The patient has been having swelling in his lower extremities.  He refuses to elevate his legs without prompting from his wife.  He has been using compression stockings at home.  These have been helpful.  He is to receive Lasix in the past, however does not have any right now.  Social History   Socioeconomic History  . Marital status: Married  Social History Narrative   Lives at home w/ his wife   Right-handed   Drinks 1-2 sodas per day   Past Medical History:  Diagnosis Date  . Abnormality of gait    multiple cervical spondylosis  . Anxiety   . Bilateral leg weakness    due to back surgeries--  mostly uses wheelchair and at home very short distant w/ walker  . BPH (benign prostatic hyperplasia)   . Cervical spondylosis without myelopathy    per dr cram (neuro surg.)  . Chronic back pain   . Chronic gout   . Depression   .  Diverticulosis of colon   . Emphysema/COPD (HCC)   . Full dentures   . GERD (gastroesophageal reflux disease)   . Hard of hearing    does not wear hearing aides  . Hemorrhoids   . Hyperlipidemia   . Lower urinary tract symptoms (LUTS)   . Numbness    hands and related to neck  . OA (osteoarthritis)    hands  . Self-catheterizes urinary bladder   . Wears glasses    Family History  Problem Relation Age of Onset  . Diabetes Mother   . Parkinson's disease Father   . Heart disease Father    Allergies as of 07/29/2017      Reactions   Hydromorphone Itching   Oxycodone Itching, Nausea Only      Medication List        Accurate as of 07/29/17  5:08 PM. Always use your most recent med list.          albuterol 108 (90 Base) MCG/ACT inhaler Commonly known as:  PROVENTIL HFA;VENTOLIN HFA Inhale 2 puffs into the lungs every 6 (six) hours as needed for wheezing or shortness of breath.   atorvastatin 20 MG tablet Commonly known as:  LIPITOR TAKE 1 TABLET BY MOUTH DAILY   carbidopa-levodopa 25-100 MG tablet Commonly known as:  SINEMET IR Take 1 tablet by mouth 3 (three) times daily.   colchicine 0.6 MG tablet TAKE 1 TABLET BY MOUTH EVERY 2 HOURS UNTIL RELIEF as needed   diazepam 5 MG tablet Commonly known as:  VALIUM Take 2.5-5 mg by mouth daily as needed (neck pain).   diclofenac sodium 1 % Gel Commonly known as:  VOLTAREN Apply 2 g 4 (four) times daily topically.   febuxostat 40 MG tablet Commonly known as:  ULORIC Take 1 tablet (40 mg total) by mouth daily as needed.   Fish Oil 1000 MG Caps Take 2,000 mg by mouth daily.   Fluticasone-Umeclidin-Vilant 100-62.5-25 MCG/INH Aepb Commonly known as:  TRELEGY ELLIPTA Inhale 1 puff into the lungs daily.   furosemide 20 MG tablet Commonly known as:  LASIX Take 2 tablets (40 mg total) by mouth daily.   ipratropium-albuterol 0.5-2.5 (3) MG/3ML Soln Commonly known as:  DUONEB Take 3 mLs by nebulization every 6 (six)  hours as needed.   morphine 15 MG tablet Commonly known as:  MSIR Take 1 tablet (15 mg total) by mouth every 6 (six) hours as needed for severe pain.   multivitamin with minerals Tabs tablet Take 1 tablet by mouth daily.   niacin 500 MG tablet Take 500 mg by mouth daily.   omeprazole 20 MG capsule Commonly known as:  PRILOSEC Take 1 capsule (20 mg total) by mouth every morning.   ondansetron 4 MG disintegrating tablet Commonly known as:  ZOFRAN ODT Take 1 tablet (4 mg total) by mouth every 8 (eight) hours as needed for nausea or vomiting.   potassium chloride 10 MEQ tablet Commonly known as:  KLOR-CON 10 Take 2 tablets (20 mEq total) by mouth daily.   predniSONE 10 MG tablet Commonly known as:  DELTASONE Take 2 tabs for 2 days, then 1 tab for 3 days, then 1/2 tab for 4 days.   PROBIOTIC PO Take 1 capsule by mouth daily.   sertraline 25 MG tablet Commonly known as:  ZOLOFT TAKE 1 TABLET BY MOUTH DAILY   SYSTANE OP Place 2 drops into both eyes at bedtime as needed (For dry eyes.).   tamsulosin 0.4 MG Caps capsule Commonly known as:  FLOMAX Take 2 capsules (0.8 mg total) by mouth every morning.       ROS:  Constitutional: No fevers or chills, no weight loss HEENT: No headaches, hearing loss, or runny nose, no sore throat Heart: No chest pain Lungs: No SOB, no cough Abd: No bowel changes, no pain, no N/V GU: No urinary complaints Neuro: No numbness, tingling or weakness Skin: +irritation over buttock Msk: No joint or muscle pain  Objective BP 122/80 (BP Location: Left Arm, Patient Position: Sitting, Cuff Size: Normal)   Pulse 69   Temp 98.1 F (36.7 C) (Oral)   Ht 5\' 6"  (1.676 m)   Wt 223 lb (101.2 kg)   SpO2 93%   BMI 35.99 kg/m  General Appearance:  awake, alert, oriented, in no acute distress and well developed, well nourished Skin:  +hyperemia on skin overlying sacral area, no fluctuance or ulceration; otherwise there are no suspicious lesions or  rashes of concern Head/face:  NCAT Eyes:  EOMI, PERRLA Ears:  canals and TMs NI Nose/Sinuses:  negative Mouth/Throat:  Mucosa moist, no lesions; pharynx without erythema, edema or exudate. Neck:  neck- supple, no mass, non-tender and no jvd Lungs: Clear to auscultation.  No rales, rhonchi, or wheezing. Normal effort, no accessory muscle use. Heart:  Heart sounds  are normal.  Regular rate and rhythm without murmur, gallop or rub. No bruits. 2+ pitting bilateral lower extremity edema up to the knees. Abdomen:  BS+, soft, NT, ND, no masses or organomegaly Musculoskeletal:  No muscle group atrophy or asymmetry Neurologic:  Alert and oriented x 3 Psych exam: Nml mood and affect, age appropriate judgment and insight  Sepsis, due to unspecified organism [facility]) - Plan: Basic Metabolic Panel (BMET), CBC, CANCELED: CBC, CANCELED: Basic metabolic panel  Chronic diastolic CHF (congestive heart failure) (HCC) - Plan: Basic Metabolic Panel (BMET), CBC  Pressure ulcer of sacral region, stage 1 - Plan: Basic Metabolic Panel (BMET), CBC  Discharge summary and medication list have been reviewed/reconciled.  Labs pending at the time of discharge have been reviewed or are still pending at the time of this visit.  Follow-up labs and appointments have been ordered and/or coordinated appropriately. Educational materials regarding the patient's admitting diagnosis provided.  TRANSITIONAL CARE MANAGEMENT CERTIFICATION:  I certify the following are true:   1. Communication with the patient/care giver was made within 2 business days of discharge.  2. Complexity of Medical decision making is mod.  3. Face to face visit occurred within 14 days of discharge.   Order for sacral pads written.  I want him to use the donut cushion to offload this area.  I am very concerned this will turn into a decubitus ulcer with deeper involvement of tissue.  Okay to remove the PICC line, will follow up on some labs today.  Restart  Lasix with associated potassium.  Will check BMP today.  Check next week as well. F/u 1 week to recheck skin. The patient voiced understanding and agreement to the plan.  Jilda Roche [place], DO 07/29/17 5:08 PM

## 2017-07-29 NOTE — Patient Instructions (Signed)
Lasix should be taken with potassium.  I want pressure off of the area on the buttocks. Use the donut seat/cushion.  Fevers, drainage, foul odor, increasing pain should prompt us to seek care.

## 2017-07-29 NOTE — Progress Notes (Signed)
Pre visit review using our clinic review tool, if applicable. No additional management support is needed unless otherwise documented below in the visit note. 

## 2017-08-01 ENCOUNTER — Telehealth: Payer: Self-pay | Admitting: Family Medicine

## 2017-08-01 DIAGNOSIS — I700 Atherosclerosis of aorta: Secondary | ICD-10-CM | POA: Diagnosis not present

## 2017-08-01 DIAGNOSIS — M1A9XX0 Chronic gout, unspecified, without tophus (tophi): Secondary | ICD-10-CM | POA: Diagnosis not present

## 2017-08-01 DIAGNOSIS — I2510 Atherosclerotic heart disease of native coronary artery without angina pectoris: Secondary | ICD-10-CM | POA: Diagnosis not present

## 2017-08-01 DIAGNOSIS — K219 Gastro-esophageal reflux disease without esophagitis: Secondary | ICD-10-CM | POA: Diagnosis not present

## 2017-08-01 DIAGNOSIS — N401 Enlarged prostate with lower urinary tract symptoms: Secondary | ICD-10-CM | POA: Diagnosis not present

## 2017-08-01 DIAGNOSIS — I5030 Unspecified diastolic (congestive) heart failure: Secondary | ICD-10-CM | POA: Diagnosis not present

## 2017-08-01 DIAGNOSIS — A411 Sepsis due to other specified staphylococcus: Secondary | ICD-10-CM | POA: Diagnosis not present

## 2017-08-01 DIAGNOSIS — J479 Bronchiectasis, uncomplicated: Secondary | ICD-10-CM | POA: Diagnosis not present

## 2017-08-01 DIAGNOSIS — R918 Other nonspecific abnormal finding of lung field: Secondary | ICD-10-CM | POA: Diagnosis not present

## 2017-08-01 DIAGNOSIS — G8929 Other chronic pain: Secondary | ICD-10-CM | POA: Diagnosis not present

## 2017-08-01 DIAGNOSIS — E785 Hyperlipidemia, unspecified: Secondary | ICD-10-CM | POA: Diagnosis not present

## 2017-08-01 DIAGNOSIS — R6521 Severe sepsis with septic shock: Secondary | ICD-10-CM | POA: Diagnosis not present

## 2017-08-01 DIAGNOSIS — M1990 Unspecified osteoarthritis, unspecified site: Secondary | ICD-10-CM | POA: Diagnosis not present

## 2017-08-01 DIAGNOSIS — M8580 Other specified disorders of bone density and structure, unspecified site: Secondary | ICD-10-CM | POA: Diagnosis not present

## 2017-08-01 DIAGNOSIS — B957 Other staphylococcus as the cause of diseases classified elsewhere: Secondary | ICD-10-CM | POA: Diagnosis not present

## 2017-08-01 DIAGNOSIS — J439 Emphysema, unspecified: Secondary | ICD-10-CM | POA: Diagnosis not present

## 2017-08-01 DIAGNOSIS — N390 Urinary tract infection, site not specified: Secondary | ICD-10-CM | POA: Diagnosis not present

## 2017-08-01 DIAGNOSIS — M4802 Spinal stenosis, cervical region: Secondary | ICD-10-CM | POA: Diagnosis not present

## 2017-08-01 DIAGNOSIS — R300 Dysuria: Secondary | ICD-10-CM | POA: Diagnosis not present

## 2017-08-01 DIAGNOSIS — R338 Other retention of urine: Secondary | ICD-10-CM | POA: Diagnosis not present

## 2017-08-01 DIAGNOSIS — J9601 Acute respiratory failure with hypoxia: Secondary | ICD-10-CM | POA: Diagnosis not present

## 2017-08-01 NOTE — Telephone Encounter (Signed)
Copied from CRM [id number]. Topic: General - Other >> Aug 01, 2017 12:36 PM Debroah LoopLander, Lumin L wrote: Reason for CRM: Lawanna KobusAngel with [facility] calling for verbal orders to remove pic line today and verbal order for nursing 1wk for 3 weeks to start next week. She is going to patients how now. Would like a call back as soon as possible.

## 2017-08-01 NOTE — Telephone Encounter (Signed)
HHRN informed of ok per PCP.   

## 2017-08-02 ENCOUNTER — Encounter (HOSPITAL_BASED_OUTPATIENT_CLINIC_OR_DEPARTMENT_OTHER): Payer: Self-pay | Admitting: *Deleted

## 2017-08-02 ENCOUNTER — Other Ambulatory Visit: Payer: Self-pay

## 2017-08-02 ENCOUNTER — Telehealth: Payer: Self-pay | Admitting: Family Medicine

## 2017-08-02 NOTE — Telephone Encounter (Signed)
Copied from CRM [id number]. Topic: Quick Communication - See Telephone Encounter >> Aug 02, 2017  2:13 PM Cipriano BunkerLambe, Annette S wrote: CRM for notification  Ellender HoseConnie Allance urology [phone]  ext [phone]  Faxing Pre-surgery form,  asking if needs to be seen for this.   Surgery scheduled for 4/24 To fax back # [phone]  Pt. Is coming in on 4/22 for re-check  See Telephone encounter for: 08/02/17.

## 2017-08-02 NOTE — Progress Notes (Addendum)
SPOKE W/ BOTH PT AND PT WIFE VIA PHONE FOR PRE-OP INTERVIEW.  NPO AFTER MN.  ARRIVE AT [id number].  CURRENT LAB RESULTS , CXR, AND EKG IN CHART AND Epic.  WILL TAKE FLOMAX, LIPITOR, ZOLOFT, AND DO TRELEGY/ELLIPTA INHALER, NEBULIZER AM DOS W/ SIPS OF WATER.  PT USES WHEELCHAIR, PT CAN STAND AND PIVOT TO STRETCHER. PT WIFE MUST BE IN PRE-OP W/ PT.  PT HAS PRESSURE ULCER SACRAL REGION.  PT FOLLOW-UP PCP VISIT AFTER HOSPITAL ADMISSION DATED 07-29-2017 IN CHART AND Epic.   PER PT AND PT WIFE PT HAD NOT STARTED LASIX[facility]SI[facility] AND AS PER PCP NOTE DATED 07-29-2017.  STATED HE HAS URINE FREQUENCY DUE TO UTI AND DID NOT WANT TO START UNTIL AFTER SURGERY.  BUT PT AND WIFE VERBALIZED UNDERSTANDING AFTER EXPLAINING AND ADVISED OF REASON FOR LASIX IS FOR RETENTION WITH CHF. BOTH STATED PT WILL START TAKING LASIX/POTASSIUM TODAY.

## 2017-08-03 ENCOUNTER — Ambulatory Visit: Payer: Self-pay

## 2017-08-03 ENCOUNTER — Emergency Department (HOSPITAL_COMMUNITY)
Admission: EM | Admit: 2017-08-03 | Discharge: 2017-08-04 | Disposition: A | Discharge status: Home / Self Care | Payer: Medicare Other | Attending: Emergency Medicine | Admitting: Emergency Medicine

## 2017-08-03 ENCOUNTER — Encounter (HOSPITAL_COMMUNITY): Payer: Self-pay | Admitting: *Deleted

## 2017-08-03 ENCOUNTER — Emergency Department (HOSPITAL_COMMUNITY): Payer: Medicare Other

## 2017-08-03 DIAGNOSIS — Z79899 Other long term (current) drug therapy: Secondary | ICD-10-CM | POA: Diagnosis not present

## 2017-08-03 DIAGNOSIS — G20 Parkinson's disease: Secondary | ICD-10-CM | POA: Diagnosis not present

## 2017-08-03 DIAGNOSIS — R0602 Shortness of breath: Secondary | ICD-10-CM | POA: Insufficient documentation

## 2017-08-03 DIAGNOSIS — R000 Tachycardia, unspecified: Secondary | ICD-10-CM | POA: Diagnosis not present

## 2017-08-03 DIAGNOSIS — Z87891 Personal history of nicotine dependence: Secondary | ICD-10-CM | POA: Diagnosis not present

## 2017-08-03 DIAGNOSIS — J42 Unspecified chronic bronchitis: Secondary | ICD-10-CM | POA: Diagnosis not present

## 2017-08-03 DIAGNOSIS — N390 Urinary tract infection, site not specified: Secondary | ICD-10-CM | POA: Insufficient documentation

## 2017-08-03 DIAGNOSIS — I5032 Chronic diastolic (congestive) heart failure: Secondary | ICD-10-CM | POA: Insufficient documentation

## 2017-08-03 DIAGNOSIS — R509 Fever, unspecified: Secondary | ICD-10-CM | POA: Diagnosis present

## 2017-08-03 LAB — COMPREHENSIVE METABOLIC PANEL
ALT: 20 U/L (ref 17–63)
AST: 20 U/L (ref 15–41)
Albumin: 3.1 g/dL — ABNORMAL LOW (ref 3.5–5.0)
Alkaline Phosphatase: 86 U/L (ref 38–126)
Anion gap: 9 (ref 5–15)
BUN: 22 mg/dL — ABNORMAL HIGH (ref 6–20)
CO2: 21 mmol/L — ABNORMAL LOW (ref 22–32)
Calcium: 7.8 mg/dL — ABNORMAL LOW (ref 8.9–10.3)
Chloride: 110 mmol/L (ref 101–111)
Creatinine, Ser: 0.76 mg/dL (ref 0.61–1.24)
GFR calc Af Amer: >60 mL/min (ref >60)
GFR calc non Af Amer: >60 mL/min (ref >60)
Glucose, Bld: 144 mg/dL — ABNORMAL HIGH (ref 65–99)
Potassium: 3.8 mmol/L (ref 3.5–5.1)
Sodium: 140 mmol/L (ref 135–145)
Total Bilirubin: 1 mg/dL (ref 0.3–1.2)
Total Protein: 6.4 g/dL — ABNORMAL LOW (ref 6.5–8.1)

## 2017-08-03 LAB — URINALYSIS, ROUTINE W REFLEX MICROSCOPIC
Bilirubin Urine: NEGATIVE
Glucose, UA: NEGATIVE mg/dL
Nitrite: POSITIVE — AB
Protein, ur: 30 mg/dL — AB
Specific Gravity, Urine: 1.015 (ref 1.005–1.030)
pH: 8.5 — ABNORMAL HIGH (ref 5.0–8.0)

## 2017-08-03 LAB — CBC WITH DIFFERENTIAL/PLATELET
Basophils Absolute: 0 10*3/uL (ref 0.0–0.1)
Basophils Relative: 0 %
Eosinophils Absolute: 0 10*3/uL (ref 0.0–0.7)
Eosinophils Relative: 0 %
HCT: 34.4 % — ABNORMAL LOW (ref 39.0–52.0)
Hemoglobin: 11.1 g/dL — ABNORMAL LOW (ref 13.0–17.0)
Lymphocytes Relative: 4 %
Lymphs Abs: 0.4 10*3/uL — ABNORMAL LOW (ref 0.7–4.0)
MCH: 30.3 pg (ref 26.0–34.0)
MCHC: 32.3 g/dL (ref 30.0–36.0)
MCV: 94 fL (ref 78.0–100.0)
Monocytes Absolute: 0.2 10*3/uL (ref 0.1–1.0)
Monocytes Relative: 2 %
Neutro Abs: 8.4 10*3/uL — ABNORMAL HIGH (ref 1.7–7.7)
Neutrophils Relative %: 94 %
Platelets: 195 10*3/uL (ref 150–400)
RBC: 3.66 MIL/uL — ABNORMAL LOW (ref 4.22–5.81)
RDW: 14.5 % (ref 11.5–15.5)
WBC: 9 10*3/uL (ref 4.0–10.5)

## 2017-08-03 LAB — URINALYSIS, MICROSCOPIC (REFLEX)

## 2017-08-03 LAB — LACTIC ACID, PLASMA: Lactic Acid, Venous: 0.9 mmol/L (ref 0.5–1.9)

## 2017-08-03 MED ORDER — IPRATROPIUM-ALBUTEROL 0.5-2.5 (3) MG/3ML IN SOLN
3.0000 mL | Freq: Once | RESPIRATORY_TRACT | Status: AC
  Administered 2017-08-03: 3 mL via RESPIRATORY_TRACT
  Filled 2017-08-03: qty 3

## 2017-08-03 MED ORDER — CLINDAMYCIN HCL 300 MG PO CAPS: 300.0000 mg | ORAL_CAPSULE | Freq: Three times a day (TID) | ORAL | 0 refills | Status: DC

## 2017-08-03 MED ORDER — CLINDAMYCIN HCL 300 MG PO CAPS
600.0000 mg | ORAL_CAPSULE | Freq: Once | ORAL | Status: AC
  Administered 2017-08-03: 600 mg via ORAL
  Filled 2017-08-03: qty 2

## 2017-08-03 MED ORDER — SODIUM CHLORIDE 0.9 % IV SOLN
2.0000 g | Freq: Once | INTRAVENOUS | Status: AC
  Administered 2017-08-03: 2 g via INTRAVENOUS
  Filled 2017-08-03: qty 20

## 2017-08-03 MED ORDER — CEPHALEXIN 500 MG PO CAPS: 500.0000 mg | ORAL_CAPSULE | Freq: Three times a day (TID) | ORAL | 0 refills | Status: DC

## 2017-08-03 NOTE — Telephone Encounter (Signed)
FYI to PCP

## 2017-08-03 NOTE — ED Provider Notes (Signed)
[facility]-EMERGENCY DEPT Provider Note   CSN: [id number] Arrival date & time: 08/03/17  1759     History   Chief Complaint Chief Complaint  Patient presents with  . Shortness of Breath    HPI Evan [name] is a 77 y.o. male.  HPI   77 year old male with fever and some dyspnea.  History is supplemented by his wife at bedside.  The past 24 hours he seemed a little bit more tired than usual.  His breathing seemed to be quick which is why his wife wanted him evaluated.  Chronic cough.  He denies any severe change in this.  Nonproductive.  He states he does not feel short of breath.  Fever to 101 at home.  No unusual leg pain or swelling.  He does have chronic lower extremity edema but wife reports that this appears to be stable.  Wheezing noted by EMS he was given a DuoNeb 125 mg of Solu-Medrol prior to arrival.  He does have a past history of COPD.  Is not on home oxygen.    Past Medical History:  Diagnosis Date  . Abnormality of gait    multiple cervical spondylosis  . Anxiety   . Bilateral leg weakness    due to back surgeries--  mostly uses wheelchair and at home very short distant w/ walker  . Bilateral lower extremity edema   . BPH (benign prostatic hyperplasia)   . Cervical spondylosis without myelopathy    per dr cram (neuro surg.)  . Chronic back pain   . Chronic bronchitis (HCC)   . Chronic gout    08-02-2017  per pt gout stable ,  last bout beginning March 2019  . Depression   . Diastolic CHF (HCC)   . Diverticulosis of colon   . Emphysema/COPD [facility]) pulmologist-  dr Chilton Greathouse   last acute exacerbation 07-12-2017 (documented in epic)  . Full dentures   . GERD (gastroesophageal reflux disease)   . Hard of hearing    does not wear hearing aides  . Hemorrhoids   . History of sepsis    07-15-2017  urosepsis secondary to kidney stone extraction--  completed 14d IV vancomyocin   . Hyperlipidemia   . Lower urinary tract symptoms  (LUTS)   . Nephrolithiasis    per CT 07-15-2017 multiple non-obstructive renal stones  . Numbness    hands --- related to neck  . OA (osteoarthritis)    hands  . Pressure ulcer of sacral region, stage 1    per pcp note in epic dated 07-29-2017  . RBBB (right bundle branch block)   . Right ureteral stone   . Self-catheterizes urinary bladder   . Wears glasses     Patient Active Problem List   Diagnosis Date Noted  . Pressure ulcer of sacral region, stage 1 07/29/2017  . Sepsis (HCC) 07/15/2017  . Ureteropelvic junction (UPJ) obstruction   . Mixed simple and mucopurulent chronic bronchitis (HCC)   . Chronic bronchitis with emphysema (HCC) 05/31/2017  . Bilateral hearing loss 04/25/2017  . Parkinsonism (HCC) 09/03/2016  . Right leg weakness 09/03/2016  . Diverticulosis of colon with hemorrhage   . UTI (urinary tract infection) 12/09/2015  . Rectal bleed 12/07/2015  . Chronic diastolic CHF (congestive heart failure) (HCC) 12/07/2015  . Rectal bleeding 12/07/2015  . Abnormality of gait 10/29/2015  . 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 Weakness of both legs 07/27/2015  . Shortness of breath on exertion 07/24/2015  .  Tremor 07/24/2015  . Acute stress reaction 07/24/2015  . Screening, ischemic heart disease 01/29/2015  . Hernia of abdominal cavity 12/20/2014  . Gastroesophageal reflux disease without esophagitis 09/03/2014  . Depression with anxiety 09/03/2014  . BPH (benign prostatic hyperplasia) 09/03/2014  . Gout, chronic 09/03/2014  . Spondylolisthesis at L4-L5 level 07/22/2014  . Diastolic dysfunction with heart failure (HCC) 01/01/2014  . Peripheral neuropathy 01/01/2014  . DOE (dyspnea on exertion) 11/15/2013  . Leg edema 11/15/2013  . Snoring 11/15/2013  . Pulmonary emphysema (HCC) 05/10/2013    Past Surgical History:  Procedure Laterality Date  . ABDOMINAL HERNIA REPAIR  1970's  . ANTERIOR CERVICAL DECOMP/DISCECTOMY FUSION  07/03/2001   C4 --  C5/  post op Exploration and evacuation cervical hematoma  . BLEPHAROPLASTY  2008  . CARDIOVASCULAR STRESS TEST  05/27/1998   normal nuclear study w/ no ischemia/  normal LV function and wall motion , ef 64%  . CARPAL TUNNEL RELEASE Left 2003 approx.  . CERVICAL FUSION  1995   C5 -- C6  . COLONOSCOPY WITH PROPOFOL N/A 02/10/2016   Procedure: COLONOSCOPY WITH PROPOFOL;  Surgeon: Hilarie Fredrickson, MD;  Location: [facility];  Service: Endoscopy;  Laterality: N/A;  . CYSTO/  LEFT RETROGRADE PYELOGRAM/  BALLOON DILATION LEFT URETER/  URETEROSCOPY/  STENT PLACEMENT  07/28/2000  . CYSTOSCOPY W/ URETERAL STENT PLACEMENT Right 07/15/2017   Procedure: CYSTOSCOPY WITH RETROGRADE PYELOGRAM/URETERAL STENT PLACEMENT;  Surgeon: Ihor Gully, MD;  Location: [facility];  Service: Urology;  Laterality: Right;  . ESOPHAGOGASTRODUODENOSCOPY    . EXPLORATION AND RE-DO CERVICAL FUSION C4--5 AND ANTERIOR CERVIAL DISKECTOMY FUSION C3--4  08/09/2007   POST-OP EXPLORATION AND EVACUATION RETROPHARYNGEAL HEMATOMA  . LAPAROSCOPIC CHOLECYSTECTOMY  1990'S  . LUMBAR LAMINECTOMY/DECOMPRESSION MICRODISCECTOMY  10/29/2011   Procedure: LUMBAR LAMINECTOMY/DECOMPRESSION MICRODISCECTOMY 1 LEVEL;  Surgeon: Mariam Dollar, MD;  Location: [facility];  Service: Neurosurgery;  Laterality: Right;  Right Lumbar Three-Four Lumbar Laminectomy/Microdiscectomy  . MAXIMUM ACCESS (MAS)POSTERIOR LUMBAR INTERBODY FUSION (PLIF) 1 LEVEL N/A 07/22/2014   Procedure: FOR MAXIMUM ACCESS (MAS) POSTERIOR LUMBAR INTERBODY FUSION (PLIF) 1 LEVEL;  Surgeon: Donalee Citrin, MD;  Location: [facility];  Service: Neurosurgery;  Laterality: N/A;  FOR MAXIMUM ACCESS (MAS) POSTERIOR LUMBAR INTERBODY FUSION (PLIF) 1 LEVEL L4-5  . NEUROPLASTY / TRANSPOSITION ULNAR NERVE AT ELBOW Left 07/23/2008  . POSTERIOR CERVICAL FUSION/FORAMINOTOMY  06/09/2011   Procedure: POSTERIOR CERVICAL FUSION/FORAMINOTOMY LEVEL 4;  Surgeon: Mariam Dollar, MD;  Location: [facility];  Service: Neurosurgery;   Laterality: N/A;  Cervical three to seven  posterior cervical fusion, Cervical four to seven Laminectomy, bone graft from right iliac crest   . POSTERIOR LUMBAR LAMINECTOMY AND DISKECTOMY  10/15/2009   L3 -- L4  . TONSILLECTOMY AND ADENOIDECTOMY  child  . TRANSTHORACIC ECHOCARDIOGRAM  11/27/2013   grade 1 diastolic dysfunction, ef 50-55%/  trivial AR and PR/ mild MR and TR/  PASP  . TRANSURETHRAL RESECTION OF PROSTATE N/A 05/31/2016   Procedure: TRANSURETHRAL RESECTION OF THE PROSTATE (TURP);  Surgeon: Jethro Bolus, MD;  Location: [facility];  Service: Urology;  Laterality: N/A;        Home Medications    Prior to Admission medications   Medication Sig Start Date End Date Taking? Authorizing Provider  albuterol (PROVENTIL HFA;VENTOLIN HFA) 108 (90 Base) MCG/ACT inhaler Inhale 2 puffs into the lungs every 6 (six) hours as needed for wheezing or shortness of breath. 04/28/17  Yes Copland, Gwenlyn Found, MD  atorvastatin (LIPITOR)  20 MG tablet TAKE 1 TABLET BY MOUTH DAILY Patient taking differently: TAKE 1 TABLET (20 MG) BY MOUTH DAILY--- takes in am 05/31/17  Yes Wendling, Jilda RocheNicholas Paul, DO  diazepam (VALIUM) 5 MG tablet Take 2.5-5 mg by mouth daily as needed (neck pain).  05/10/16  Yes [provider]  Fluticasone-Umeclidin-Vilant (TRELEGY ELLIPTA) 100-62.5-25 MCG/INH AEPB Inhale 1 puff into the lungs daily. Patient taking differently: Inhale 1 puff into the lungs every morning.  07/20/17  Yes Mannam, Praveen, MD  furosemide (LASIX) 20 MG tablet Take 2 tablets (40 mg total) by mouth daily. Patient taking differently: Take 40 mg by mouth every morning.  07/29/17  Yes Sharlene DoryWendling, Nicholas Paul, DO  ipratropium-albuterol (DUONEB) 0.5-2.5 (3) MG/3ML SOLN Take 3 mLs by nebulization every 6 (six) hours as needed. Patient taking differently: Take 3 mLs by nebulization every 6 (six) hours as needed (wheezing/shortness of breath).  04/28/17  Yes Copland, Gwenlyn FoundJessica C, MD    magnesium hydroxide (MILK OF MAGNESIA) 800 MG/5ML suspension Take by mouth daily as needed for constipation.   Yes [provider]  morphine (MSIR) 15 MG tablet Take 1 tablet (15 mg total) by mouth every 6 (six) hours as needed for severe pain. 07/15/17  Yes Fawze, Mina A, PA-C  Multiple Vitamin (MULTIVITAMIN WITH MINERALS) TABS tablet Take 1 tablet by mouth daily.   Yes [provider]  niacin 500 MG tablet Take 500 mg by mouth daily.    Yes [provider]  omeprazole (PRILOSEC) 20 MG capsule Take 1 capsule (20 mg total) by mouth every morning. Patient taking differently: Take 20 mg by mouth every morning.  07/30/16  Yes Wendling, Jilda RocheNicholas Paul, DO  Polyethyl Glycol-Propyl Glycol (SYSTANE OP) Place 2 drops into both eyes at bedtime as needed (For dry eyes.).    Yes [provider]  potassium chloride (KLOR-CON 10) 10 MEQ tablet Take 2 tablets (20 mEq total) by mouth daily. Patient taking differently: Take 20 mEq by mouth every morning.  07/29/17  Yes Sharlene DoryWendling, Nicholas Paul, DO  Probiotic Product (PROBIOTIC PO) Take 1 capsule by mouth every morning.    Yes [provider]  sertraline (ZOLOFT) 25 MG tablet TAKE 1 TABLET BY MOUTH DAILY Patient taking differently: TAKE 1 TABLET (25 MG) BY MOUTH DAILY--- takes in am 05/10/17  Yes Wendling, Jilda RocheNicholas Paul, DO  tamsulosin (FLOMAX) 0.4 MG CAPS capsule Take 2 capsules (0.8 mg total) by mouth every morning. 07/19/17  Yes Noralee Stainhoi, Jennifer, DO  traMADol (ULTRAM) 50 MG tablet Take by mouth every 6 (six) hours as needed.   Yes [provider]  carbidopa-levodopa (SINEMET IR) 25-100 MG tablet Take 1 tablet by mouth 3 (three) times daily. Patient not taking: Reported on 08/03/2017 09/03/16   Nita SicklePatel, Donika K, DO  colchicine 0.6 MG tablet TAKE 1 TABLET BY MOUTH EVERY 2 HOURS UNTIL RELIEF as needed Patient not taking: Reported on 08/03/2017 07/14/16   Sharlene DoryWendling, Nicholas Paul, DO  diclofenac sodium (VOLTAREN) 1 % GEL Apply  2 g 4 (four) times daily topically. Patient not taking: Reported on 08/03/2017 03/07/17   Sharlene DoryWendling, Nicholas Paul, DO  febuxostat (ULORIC) 40 MG tablet Take 1 tablet (40 mg total) by mouth daily as needed. Patient not taking: Reported on 08/03/2017 12/23/16   Sharlene DoryWendling, Nicholas Paul, DO    Family History Family History  Problem Relation Age of Onset  . Diabetes Mother   . Parkinson's disease Father   . Heart disease Father     Social History Social History  Tobacco Use  . Smoking status: Former Smoker    Packs/day: 1.50    Years: 55.00    Pack years: 82.50    Types: Cigarettes    Last attempt to quit: 05/27/2013    Years since quitting: 4.1  . Smokeless tobacco: Former Neurosurgeon    Types: Chew    Quit date: 02/01/2017  Substance Use Topics  . Alcohol use: No    Alcohol/week: 0.0 oz  . Drug use: No     Allergies   Hydromorphone and Oxycodone   Review of Systems Review of Systems  All systems reviewed and negative, other than as noted in HPI.   Physical Exam Updated Vital Signs BP 131/65 (BP Location: Right Arm)   Pulse 95   Temp 99.2 F (37.3 C) (Oral)   Resp (!) 22   SpO2 91%   Physical Exam  Constitutional: He appears well-developed and well-nourished. No distress.  HENT:  Head: Normocephalic and atraumatic.  Eyes: Conjunctivae are normal. Right eye exhibits no discharge. Left eye exhibits no discharge.  Neck: Neck supple.  Cardiovascular: Normal rate, regular rhythm and normal heart sounds. Exam reveals no gallop and no friction rub.  No murmur heard. Pulmonary/Chest: Effort normal. No respiratory distress. He has wheezes.  Abdominal: Soft. He exhibits no distension. There is no tenderness.  Musculoskeletal: He exhibits no edema or tenderness.  Neurological: He is alert.  Skin: Skin is warm and dry.  Psychiatric: He has a normal mood and affect. His behavior is normal. Thought content normal.  Nursing note and vitals reviewed.    ED Treatments / Results   Labs (all labs ordered are listed, but only abnormal results are displayed) Labs Reviewed  CBC WITH DIFFERENTIAL/PLATELET - Abnormal; Notable for the following components:      Result Value   RBC 3.66 (*)    Hemoglobin 11.1 (*)    HCT 34.4 (*)    Neutro Abs 8.4 (*)    Lymphs Abs 0.4 (*)    All other components within normal limits  COMPREHENSIVE METABOLIC PANEL - Abnormal; Notable for the following components:   CO2 21 (*)    Glucose, Bld 144 (*)    BUN 22 (*)    Calcium 7.8 (*)    Total Protein 6.4 (*)    Albumin 3.1 (*)    All other components within normal limits  URINALYSIS, ROUTINE W REFLEX MICROSCOPIC - Abnormal; Notable for the following components:   APPearance TURBID (*)    pH 8.5 (*)    Hgb urine dipstick LARGE (*)    Ketones, ur TRACE (*)    Protein, ur 30 (*)    Nitrite POSITIVE (*)    Leukocytes, UA MODERATE (*)    All other components within normal limits  URINALYSIS, MICROSCOPIC (REFLEX) - Abnormal; Notable for the following components:   Bacteria, UA MANY (*)    Squamous Epithelial / LPF 0-5 (*)    All other components within normal limits  CULTURE, BLOOD (ROUTINE X 2)  CULTURE, BLOOD (ROUTINE X 2)  URINE CULTURE  LACTIC ACID, PLASMA    EKG EKG Interpretation  Date/Time:  Wednesday August 03 2017 18:17:47 EDT Ventricular Rate:  108 PR Interval:    QRS Duration: 133 QT Interval:  348 QTC Calculation: 467 R Axis:   154 Text Interpretation:  Sinus tachycardia Multiple ventricular premature complexes Nonspecific intraventricular conduction delay Borderline T abnormalities, inferior leads Confirmed by Raeford Razor [phone]) on 08/03/2017 6:40:53 PM   Radiology Dg Chest 2  View  Result Date: 08/03/2017 CLINICAL DATA:  Shortness of breath, difficulty breathing, fever EXAM: CHEST - 2 VIEW COMPARISON:  07/15/2017 FINDINGS: Stable hyperinflation compatible with COPD/emphysema. Similar basilar scarring. No superimposed pneumonia, collapse or consolidation.  Negative for edema, effusion or pneumothorax. Trachea is midline. Normal heart size and vascularity. Bones are osteopenic. IMPRESSION: Stable COPD/emphysema pattern with basilar scarring. No interval change or superimposed acute process. Electronically Signed   By: Judie Petit.  Shick M.D.   On: 08/03/2017 19:45    Procedures Procedures (including critical care time)  Medications Ordered in ED Medications  cefTRIAXone (ROCEPHIN) 2 g in sodium chloride 0.9 % 100 mL IVPB (2 g Intravenous New Bag/Given 08/03/17 2241)  clindamycin (CLEOCIN) capsule 600 mg (has no administration in time range)  ipratropium-albuterol (DUONEB) 0.5-2.5 (3) MG/3ML nebulizer solution 3 mL (3 mLs Nebulization Given 08/03/17 2114)     Initial Impression / Assessment and Plan / ED Course  I have reviewed the triage vital signs and the nursing notes.  Pertinent labs & imaging results that were available during my care of the patient were reviewed by me and considered in my medical decision making (see chart for details).    76yM with fever. He actually has no complaints other than dysuria. UA noted. Recent admit for urosepsis in setting of obstructing ureteral stone. Has stent. Previous urine cultures with coagulase negative staph. He was previously on vancomycin. Will add clindamycin to keflex. He is not hypotensive. Normal lactic acid. He doesn't appear toxic. Nontoxic. I feel he can be treated as an outpt at this time.   Final Clinical Impressions(s) / ED Diagnoses   Final diagnoses:  Urinary tract infection without hematuria, site unspecified    ED Discharge Orders    None       Raeford Razor, MD 08/06/17 1001

## 2017-08-03 NOTE — Telephone Encounter (Signed)
He needs a pre-op?

## 2017-08-03 NOTE — ED Triage Notes (Signed)
Per EMS, pt complains of difficulty breathing x 3 days. Pt had wheezing upon arrival, given 5mg  albuterol by fire department. Pt given dueneb by EMS. Pt given 125mg  solumedrol. Hx COPD  O2 95 on nebulizer treatment  BP 142/72 HR 102 RR 20

## 2017-08-03 NOTE — Telephone Encounter (Signed)
The patient's wife called the home health nurse to notify her that he had a temperature of 102. She called the office and I advised the agent to let the Home Health nurse know I will call the patient. I called the patient and his wife Lupita LeashDonna answered the phone. She says "I came home about 15 minutes ago and he was shaking. I took his temperature and it was 102. He had a PICC line removed last Friday and went to the urologist on Monday and gave a urine sample. I have not heard back about that." I asked has he been running a fever since the PICC line removed, she says "no, he was fine until today when I got back." I asked about other symptoms, she says "he is congested in his chest and is getting more SOB than he normally is with COPD. He has a kidney stone that is on schedule to be removed next Wednesday." According to protocol, see PCP within 4 hours, I asked could she get him to an UC, she says "I will call the ambulance to take him to the emergency room and I will not give him Tylenol. I will let them give him something after they recheck his temperature, to make sure it's right." Care advice given, she verbalized understanding.   Reason for Disposition . [1] Fever > 101 F (38.3 C) AND [2] age > 4660  Answer Assessment - Initial Assessment Questions 1. TEMPERATURE: "What is the most recent temperature?"  "How was it measured?"      102.0 ear thermometer; done within the last 15 minutes 2. ONSET: "When did the fever start?"      Today 3. SYMPTOMS: "Do you have any other symptoms besides the fever?"  (e.g., colds, headache, sore throat, earache, cough, rash, diarrhea, vomiting, abdominal pain)     Chest congestion, SOB 4. CAUSE: If there are no symptoms, ask: "What do you think is causing the fever?"      I don't know; PICC line pulled last Friday 5. CONTACTS: "Does anyone else in the family have an infection?"     No 6. TREATMENT: "What have you done so far to treat this fever?" (e.g., medications)    Nothing yet 7. IMMUNOCOMPROMISE: "Do you have of the following: diabetes, HIV positive, splenectomy, cancer chemotherapy, chronic steroid treatment, transplant patient, etc."     COPD, has kidney stone scheduled for removal next week 8. PREGNANCY: "Is there any chance you are pregnant?" "When was your last menstrual period?"     N/A 9. TRAVEL: "Have you traveled out of the country in the last month?" (e.g., travel history, exposures)     No  Protocols used: FEVER-A-AH

## 2017-08-03 NOTE — ED Notes (Signed)
Pt states "I self-cath at least twice a day, so getting a urine sample will be difficult. You may have to cath me".

## 2017-08-03 NOTE — Telephone Encounter (Signed)
Paperwork was faxed 08/02/17 at 2:41pm; forwarded to provider with note/SLS 04/17

## 2017-08-03 NOTE — Telephone Encounter (Signed)
Patient is scheduled at 1 pm Monday 4/22---PCP request he come at 12:45--patients wife informed

## 2017-08-04 DIAGNOSIS — I700 Atherosclerosis of aorta: Secondary | ICD-10-CM | POA: Diagnosis not present

## 2017-08-04 DIAGNOSIS — I5030 Unspecified diastolic (congestive) heart failure: Secondary | ICD-10-CM | POA: Diagnosis not present

## 2017-08-04 DIAGNOSIS — J439 Emphysema, unspecified: Secondary | ICD-10-CM | POA: Diagnosis not present

## 2017-08-04 DIAGNOSIS — M1990 Unspecified osteoarthritis, unspecified site: Secondary | ICD-10-CM | POA: Diagnosis not present

## 2017-08-04 DIAGNOSIS — K219 Gastro-esophageal reflux disease without esophagitis: Secondary | ICD-10-CM | POA: Diagnosis not present

## 2017-08-04 DIAGNOSIS — N390 Urinary tract infection, site not specified: Secondary | ICD-10-CM | POA: Diagnosis not present

## 2017-08-04 DIAGNOSIS — A411 Sepsis due to other specified staphylococcus: Secondary | ICD-10-CM | POA: Diagnosis not present

## 2017-08-04 DIAGNOSIS — M8580 Other specified disorders of bone density and structure, unspecified site: Secondary | ICD-10-CM | POA: Diagnosis not present

## 2017-08-04 DIAGNOSIS — E785 Hyperlipidemia, unspecified: Secondary | ICD-10-CM | POA: Diagnosis not present

## 2017-08-04 DIAGNOSIS — R918 Other nonspecific abnormal finding of lung field: Secondary | ICD-10-CM | POA: Diagnosis not present

## 2017-08-04 DIAGNOSIS — I2510 Atherosclerotic heart disease of native coronary artery without angina pectoris: Secondary | ICD-10-CM | POA: Diagnosis not present

## 2017-08-04 DIAGNOSIS — R6521 Severe sepsis with septic shock: Secondary | ICD-10-CM | POA: Diagnosis not present

## 2017-08-04 DIAGNOSIS — B957 Other staphylococcus as the cause of diseases classified elsewhere: Secondary | ICD-10-CM | POA: Diagnosis not present

## 2017-08-04 DIAGNOSIS — N401 Enlarged prostate with lower urinary tract symptoms: Secondary | ICD-10-CM | POA: Diagnosis not present

## 2017-08-04 DIAGNOSIS — G8929 Other chronic pain: Secondary | ICD-10-CM | POA: Diagnosis not present

## 2017-08-04 DIAGNOSIS — M4802 Spinal stenosis, cervical region: Secondary | ICD-10-CM | POA: Diagnosis not present

## 2017-08-04 DIAGNOSIS — J9601 Acute respiratory failure with hypoxia: Secondary | ICD-10-CM | POA: Diagnosis not present

## 2017-08-04 DIAGNOSIS — M1A9XX0 Chronic gout, unspecified, without tophus (tophi): Secondary | ICD-10-CM | POA: Diagnosis not present

## 2017-08-04 DIAGNOSIS — R338 Other retention of urine: Secondary | ICD-10-CM | POA: Diagnosis not present

## 2017-08-04 DIAGNOSIS — J479 Bronchiectasis, uncomplicated: Secondary | ICD-10-CM | POA: Diagnosis not present

## 2017-08-06 DIAGNOSIS — R339 Retention of urine, unspecified: Secondary | ICD-10-CM | POA: Diagnosis not present

## 2017-08-06 LAB — URINE CULTURE: Culture: >=100000 — AB

## 2017-08-07 ENCOUNTER — Telehealth: Payer: Self-pay

## 2017-08-07 NOTE — Progress Notes (Signed)
ED Antimicrobial Stewardship Positive Culture Follow Up   Evan [name] is an 77 y.o. male who presented to [facility] on 08/03/2017 with a chief complaint of  Chief Complaint  Patient presents with  . Shortness of Breath    Recent Results (from the past 720 hour(s))  Urine culture     Status: Abnormal   Collection Time: 07/15/17 11:14 AM  Result Value Ref Range Status   Specimen Description   Final    URINE, CLEAN CATCH Performed at [facility], [address] [place], [place] [address]    Special Requests   Final    NONE Performed at [facility], [address], [place], [place] [address]    Culture (A)  Final    >=100,000 COLONIES/mL STAPHYLOCOCCUS SPECIES (COAGULASE NEGATIVE)   Report Status 07/18/2017 FINAL  Final   Organism ID, Bacteria STAPHYLOCOCCUS SPECIES (COAGULASE NEGATIVE) (A)  Final      Susceptibility   Staphylococcus species (coagulase negative) - MIC*    CIPROFLOXACIN >=8 RESISTANT Resistant     GENTAMICIN <=0.5 SENSITIVE Sensitive     NITROFURANTOIN <=16 SENSITIVE Sensitive     OXACILLIN RESISTANT Resistant     TETRACYCLINE >=16 RESISTANT Resistant     VANCOMYCIN 1 SENSITIVE Sensitive     TRIMETH/SULFA 80 RESISTANT Resistant     CLINDAMYCIN <=0.25 SENSITIVE Sensitive     RIFAMPIN <=0.5 SENSITIVE Sensitive     Inducible Clindamycin NEGATIVE Sensitive     * >=100,000 COLONIES/mL STAPHYLOCOCCUS SPECIES (COAGULASE NEGATIVE)  Culture, blood (Routine x 2)     Status: Abnormal   Collection Time: 07/15/17  6:00 PM  Result Value Ref Range Status   Specimen Description BLOOD LEFT HAND  Final   Special Requests   Final    BOTTLES DRAWN AEROBIC AND ANAEROBIC Blood Culture adequate volume   Culture  Setup Time   Final    GRAM POSITIVE COCCI AEROBIC BOTTLE ONLY CRITICAL VALUE NOTED.  VALUE IS CONSISTENT WITH PREVIOUSLY REPORTED AND CALLED VALUE.    Culture (A)  Final    STAPHYLOCOCCUS SPECIES (COAGULASE  NEGATIVE) SUSCEPTIBILITIES PERFORMED ON PREVIOUS CULTURE WITHIN THE LAST 5 DAYS. Performed at [facility], [address] [place], [place] [address]    Report Status 07/19/2017 FINAL  Final  Culture, blood (Routine x 2)     Status: Abnormal   Collection Time: 07/15/17  6:10 PM  Result Value Ref Range Status   Specimen Description BLOOD LEFT ANTECUBITAL  Final   Special Requests   Final    BOTTLES DRAWN AEROBIC AND ANAEROBIC Blood Culture adequate volume   Culture  Setup Time   Final    GRAM POSITIVE COCCI IN CLUSTERS IN BOTH AEROBIC AND ANAEROBIC BOTTLES CRITICAL RESULT CALLED TO, READ BACK BY AND VERIFIED WITH: T GREEN,PHARMD AT 0831 07/17/17 BY L BENFIELD Performed at [facility], [address] [place], [place] [address]    Culture STAPHYLOCOCCUS SPECIES (COAGULASE NEGATIVE) (A)  Final   Report Status 07/19/2017 FINAL  Final   Organism ID, Bacteria STAPHYLOCOCCUS SPECIES (COAGULASE NEGATIVE)  Final      Susceptibility   Staphylococcus species (coagulase negative) - MIC*    CIPROFLOXACIN >=8 RESISTANT Resistant     ERYTHROMYCIN >=8 RESISTANT Resistant     GENTAMICIN <=0.5 SENSITIVE Sensitive     OXACILLIN >=4 RESISTANT Resistant     TETRACYCLINE >=16 RESISTANT Resistant     VANCOMYCIN <=0.5 SENSITIVE Sensitive     TRIMETH/SULFA 80  RESISTANT Resistant     CLINDAMYCIN <=0.25 SENSITIVE Sensitive     RIFAMPIN <=0.5 SENSITIVE Sensitive     Inducible Clindamycin NEGATIVE Sensitive     * STAPHYLOCOCCUS SPECIES (COAGULASE NEGATIVE)  Blood Culture ID Panel (Reflexed)     Status: Abnormal   Collection Time: 07/15/17  6:10 PM  Result Value Ref Range Status   Enterococcus species NOT DETECTED NOT DETECTED Final   Listeria monocytogenes NOT DETECTED NOT DETECTED Final   Staphylococcus species DETECTED (A) NOT DETECTED Final    Comment: Methicillin (oxacillin) resistant coagulase negative staphylococcus. Possible blood culture contaminant (unless isolated from more than one  blood culture draw or clinical case suggests pathogenicity). No antibiotic treatment is indicated for blood  culture contaminants. CRITICAL RESULT CALLED TO, READ BACK BY AND VERIFIED WITH: T GREEN,PHARMD AT 0831 07/17/17 BY L BENFIELD    Staphylococcus aureus NOT DETECTED NOT DETECTED Final   Methicillin resistance DETECTED (A) NOT DETECTED Final    Comment: CRITICAL RESULT CALLED TO, READ BACK BY AND VERIFIED WITH: T GREEN,PHARMD AT 0831 07/17/17 BY L BENFIELD    Streptococcus species NOT DETECTED NOT DETECTED Final   Streptococcus agalactiae NOT DETECTED NOT DETECTED Final   Streptococcus pneumoniae NOT DETECTED NOT DETECTED Final   Streptococcus pyogenes NOT DETECTED NOT DETECTED Final   Acinetobacter baumannii NOT DETECTED NOT DETECTED Final   Enterobacteriaceae species NOT DETECTED NOT DETECTED Final   Enterobacter cloacae complex NOT DETECTED NOT DETECTED Final   Escherichia coli NOT DETECTED NOT DETECTED Final   Klebsiella oxytoca NOT DETECTED NOT DETECTED Final   Klebsiella pneumoniae NOT DETECTED NOT DETECTED Final   Proteus species NOT DETECTED NOT DETECTED Final   Serratia marcescens NOT DETECTED NOT DETECTED Final   Haemophilus influenzae NOT DETECTED NOT DETECTED Final   Neisseria meningitidis NOT DETECTED NOT DETECTED Final   Pseudomonas aeruginosa NOT DETECTED NOT DETECTED Final   Candida albicans NOT DETECTED NOT DETECTED Final   Candida glabrata NOT DETECTED NOT DETECTED Final   Candida krusei NOT DETECTED NOT DETECTED Final   Candida parapsilosis NOT DETECTED NOT DETECTED Final   Candida tropicalis NOT DETECTED NOT DETECTED Final    Comment: Performed at [facility], [address] [place], [place] [address]  Urine Culture     Status: Abnormal   Collection Time: 07/15/17 10:48 PM  Result Value Ref Range Status   Specimen Description   Final    CYSTOSCOPY Performed at [facility], [address], [place], [place] [address]    Special  Requests   Final    NONE Performed at [facility], [address], [place], [place] [address]    Culture (A)  Final    >=100,000 COLONIES/mL STAPHYLOCOCCUS SPECIES (COAGULASE NEGATIVE)   Report Status 07/19/2017 FINAL  Final   Organism ID, Bacteria STAPHYLOCOCCUS SPECIES (COAGULASE NEGATIVE) (A)  Final      Susceptibility   Staphylococcus species (coagulase negative) - MIC*    CIPROFLOXACIN >=8 RESISTANT Resistant     ERYTHROMYCIN >=8 RESISTANT Resistant     GENTAMICIN <=0.5 SENSITIVE Sensitive     OXACILLIN 0.5 RESISTANT Resistant     TETRACYCLINE >=16 RESISTANT Resistant     VANCOMYCIN <=0.5 SENSITIVE Sensitive     CLINDAMYCIN <=0.25 SENSITIVE Sensitive     RIFAMPIN <=0.5 SENSITIVE Sensitive     Inducible Clindamycin NEGATIVE Sensitive     * >=100,000 COLONIES/mL STAPHYLOCOCCUS SPECIES (COAGULASE NEGATIVE)  MRSA PCR Screening     Status: None  Collection Time: 07/16/17  1:02 AM  Result Value Ref Range Status   MRSA by PCR NEGATIVE NEGATIVE Final    Comment:        The GeneXpert MRSA Assay (FDA approved for NASAL specimens only), is one component of a comprehensive MRSA colonization surveillance program. It is not intended to diagnose MRSA infection nor to guide or monitor treatment for MRSA infections. Performed at [facility], [address] [place], [place] [address]   Culture, expectorated sputum-assessment     Status: None   Collection Time: 07/16/17  1:24 AM  Result Value Ref Range Status   Specimen Description SPUTUM  Final   Special Requests NONE  Final   Sputum evaluation   Final    THIS SPECIMEN IS ACCEPTABLE FOR SPUTUM CULTURE Performed at [facility], [address] [place], [place] [address]    Report Status 07/16/2017 FINAL  Final  Culture, respiratory (NON-Expectorated)     Status: None   Collection Time: 07/16/17  1:24 AM  Result Value Ref Range Status   Specimen Description   Final     SPUTUM Performed at [facility], [address] [place], [place] [address]    Special Requests   Final    NONE Reflexed from [id number] Performed at [facility], [address] [place], [place] [address]    Gram Stain   Final    MODERATE WBC PRESENT, PREDOMINANTLY PMN RARE SQUAMOUS EPITHELIAL CELLS PRESENT FEW GRAM POSITIVE COCCI IN CHAINS FEW BUDDING YEAST SEEN    Culture   Final    Consistent with normal respiratory flora. Performed at [facility], [address] [place], [place] [address]    Report Status 07/19/2017 FINAL  Final  Culture, blood (routine x 2)     Status: None   Collection Time: 07/18/17  1:15 PM  Result Value Ref Range Status   Specimen Description   Final    BLOOD LEFT ANTECUBITAL Performed at [facility], [address] [place], [place] [address]    Special Requests   Final    BOTTLES DRAWN AEROBIC AND ANAEROBIC Blood Culture adequate volume Performed at [facility], [address] [place], [place] [address]    Culture   Final    NO GROWTH 5 DAYS Performed at [facility], [address] [place], [place] [address]    Report Status 07/23/2017 FINAL  Final  Culture, blood (routine x 2)     Status: None   Collection Time: 07/18/17  1:17 PM  Result Value Ref Range Status   Specimen Description   Final    BLOOD LEFT HAND Performed at [facility], [address] [place], [place] [address]    Special Requests   Final    BOTTLES DRAWN AEROBIC ONLY Blood Culture adequate volume Performed at [facility], [address] [place], [place] [address]    Culture   Final    NO GROWTH 5 DAYS Performed at [facility], [address] [place], [place] [address]    Report Status 07/23/2017 FINAL  Final  Blood culture (routine x 2)     Status: None (Preliminary result)   Collection Time: 08/03/17  7:48 PM  Result Value Ref  Range Status   Specimen Description   Final    BLOOD LEFT ANTECUBITAL Performed at [facility], [address] [place], [place] [address]  Special Requests   Final    BOTTLES DRAWN AEROBIC AND ANAEROBIC Blood Culture adequate volume Performed at [facility], [address], [place], [place] [address]    Culture   Final    NO GROWTH 3 DAYS Performed at [facility], [address], [place], [place] [address]    Report Status PENDING  Incomplete  Urine culture     Status: Abnormal   Collection Time: 08/03/17  9:51 PM  Result Value Ref Range Status   Specimen Description   Final    URINE, RANDOM Performed at [facility], [address] [place], [place] [address]    Special Requests   Final    NONE Performed at [facility], [address] [place], [place] [address]    Culture >=100,000 COLONIES/mL PSEUDOMONAS AERUGINOSA (A)  Final   Report Status 08/06/2017 FINAL  Final   Organism ID, Bacteria PSEUDOMONAS AERUGINOSA (A)  Final      Susceptibility   Pseudomonas aeruginosa - MIC*    CEFTAZIDIME 4 SENSITIVE Sensitive     CIPROFLOXACIN 1 SENSITIVE Sensitive     GENTAMICIN 8 INTERMEDIATE Intermediate     IMIPENEM <=0.25 SENSITIVE Sensitive     PIP/TAZO 32 SENSITIVE Sensitive     CEFEPIME 16 INTERMEDIATE Intermediate     * >=100,000 COLONIES/mL PSEUDOMONAS AERUGINOSA    [x]  Treated with clindamycin and Keflex, organism resistant to prescribed antimicrobial []  Patient discharged originally without antimicrobial agent and treatment is now indicated  New antibiotic prescription: Cipro 500mg  BID for 7 days  ED Provider: Harolyn Rutherford PA-C   Toniann Fail Gunnard Dorrance 08/07/2017, 12:47 PM

## 2017-08-07 NOTE — Telephone Encounter (Signed)
Spoke with Wife. Did not get Keflex and Clindamycin filled. PCP placed on Cirpo.

## 2017-08-08 ENCOUNTER — Other Ambulatory Visit: Payer: Self-pay | Admitting: Family Medicine

## 2017-08-08 ENCOUNTER — Ambulatory Visit (INDEPENDENT_AMBULATORY_CARE_PROVIDER_SITE_OTHER): Payer: Medicare Other | Admitting: Family Medicine

## 2017-08-08 ENCOUNTER — Encounter: Payer: Self-pay | Admitting: Family Medicine

## 2017-08-08 VITALS — BP 118/80 | HR 91 | Temp 98.3°F | Ht 66.0 in | Wt 223.0 lb

## 2017-08-08 DIAGNOSIS — L89312 Pressure ulcer of right buttock, stage 2: Secondary | ICD-10-CM | POA: Diagnosis not present

## 2017-08-08 DIAGNOSIS — J42 Unspecified chronic bronchitis: Secondary | ICD-10-CM | POA: Diagnosis not present

## 2017-08-08 DIAGNOSIS — M5137 Other intervertebral disc degeneration, lumbosacral region: Secondary | ICD-10-CM | POA: Diagnosis not present

## 2017-08-08 DIAGNOSIS — Z01818 Encounter for other preprocedural examination: Secondary | ICD-10-CM

## 2017-08-08 DIAGNOSIS — M48061 Spinal stenosis, lumbar region without neurogenic claudication: Secondary | ICD-10-CM | POA: Diagnosis not present

## 2017-08-08 LAB — CULTURE, BLOOD (ROUTINE X 2)
Culture: NO GROWTH
Special Requests: ADEQUATE

## 2017-08-08 MED ORDER — SILVER SULFADIAZINE 1 % EX CREA: 1.0000 "application " | TOPICAL_CREAM | Freq: Every day | CUTANEOUS | 0 refills | Status: DC

## 2017-08-08 NOTE — Patient Instructions (Addendum)
Try the cream. Stay off of area on buttock. That is the key! Keep clean and dry.  I don't see any contraindication to surgery. You don't need to alter your medicines for this procedure.  Good luck this Wednesday.

## 2017-08-08 NOTE — Progress Notes (Signed)
Pre visit review using our clinic review tool, if applicable. No additional management support is needed unless otherwise documented below in the visit note. 

## 2017-08-08 NOTE — Progress Notes (Signed)
Subjective:   Chief Complaint  Patient presents with  . Follow-up    Evan [name]  is here for a Pre-operative physical at the request of Dr. Liliane Shi.   He  is having Cystoscopy, R ureteroscope surgery w stent placement on 08/10/17.  Personal or family hx of adverse outcome to anesthesia? No; there was an incident with CTS sx where he felt pain during procedure Chipped, cracked, missing, or loose teeth? edentulous Decreased ROM of neck? No  Able to walk up 2 flights of stairs without becoming significantly short of breath or having chest pain? N/A  Revised Goldman Criteria: High Risk Surgery (intraperitoneal, intrathoracic, aortic): No  Ischemic heart disease (Prior MI, +excercise stress test, angina, nitrate use, Qwave): No  History of heart failure: Yes  History of cerebrovascular disease: No  History of diabetes: No  Insulin therapy for DM: No  Preoperative Cr >2.0: Yes   He has been dealing with a pressure ulcer on his buttock. Denies drainage, increasing pain, fevers. Wife states area on L side is better, new side on R has come about. Home nursing has been monitoring.   Patient Active Problem List   Diagnosis Date Noted  . Pressure ulcer of sacral region, stage 1 07/29/2017  . Sepsis (HCC) 07/15/2017  . Ureteropelvic junction (UPJ) obstruction   . Mixed simple and mucopurulent chronic bronchitis (HCC)   . Chronic bronchitis with emphysema (HCC) 05/31/2017  . Bilateral hearing loss 04/25/2017  . Parkinsonism (HCC) 09/03/2016  . Right leg weakness 09/03/2016  . Diverticulosis of colon with hemorrhage   . UTI (urinary tract infection) 12/09/2015  . Rectal bleed 12/07/2015  . Chronic diastolic CHF (congestive heart failure) (HCC) 12/07/2015  . Rectal bleeding 12/07/2015  . Abnormality of gait 10/29/2015  . 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 Weakness of both legs 07/27/2015  . Shortness of breath on exertion 07/24/2015  . Tremor 07/24/2015  . Acute stress  reaction 07/24/2015  . Screening, ischemic heart disease 01/29/2015  . Hernia of abdominal cavity 12/20/2014  . Gastroesophageal reflux disease without esophagitis 09/03/2014  . Depression with anxiety 09/03/2014  . BPH (benign prostatic hyperplasia) 09/03/2014  . Gout, chronic 09/03/2014  . Spondylolisthesis at L4-L5 level 07/22/2014  . Diastolic dysfunction with heart failure (HCC) 01/01/2014  . Peripheral neuropathy 01/01/2014  . DOE (dyspnea on exertion) 11/15/2013  . Leg edema 11/15/2013  . Snoring 11/15/2013  . Pulmonary emphysema (HCC) 05/10/2013   Past Medical History:  Diagnosis Date  . Abnormality of gait    multiple cervical spondylosis  . Anxiety   . Bilateral leg weakness    due to back surgeries--  mostly uses wheelchair and at home very short distant w/ walker  . Bilateral lower extremity edema   . BPH (benign prostatic hyperplasia)   . Cervical spondylosis without myelopathy    per dr cram (neuro surg.)  . Chronic back pain   . Chronic bronchitis (HCC)   . Chronic gout    08-02-2017  per pt gout stable ,  last bout beginning March 2019  . Depression   . Diastolic CHF (HCC)   . Diverticulosis of colon   . Emphysema/COPD [facility]) pulmologist-  dr Chilton Greathouse   last acute exacerbation 07-12-2017 (documented in epic)  . Full dentures   . GERD (gastroesophageal reflux disease)   . Hard of hearing    does not wear hearing aides  . Hemorrhoids   . History of sepsis    07-15-2017  urosepsis secondary to kidney  stone extraction--  completed 14d IV vancomyocin   . Hyperlipidemia   . Lower urinary tract symptoms (LUTS)   . Nephrolithiasis    per CT 07-15-2017 multiple non-obstructive renal stones  . Numbness    hands --- related to neck  . OA (osteoarthritis)    hands  . Pressure ulcer of sacral region, stage 1    per pcp note in epic dated 07-29-2017  . RBBB (right bundle branch block)   . Right ureteral stone   . Self-catheterizes urinary bladder   .  Wears glasses     Past Surgical History:  Procedure Laterality Date  . ABDOMINAL HERNIA REPAIR  1970's  . ANTERIOR CERVICAL DECOMP/DISCECTOMY FUSION  07/03/2001   C4 -- C5/  post op Exploration and evacuation cervical hematoma  . BLEPHAROPLASTY  2008  . CARDIOVASCULAR STRESS TEST  05/27/1998   normal nuclear study w/ no ischemia/  normal LV function and wall motion , ef 64%  . CARPAL TUNNEL RELEASE Left 2003 approx.  . CERVICAL FUSION  1995   C5 -- C6  . COLONOSCOPY WITH PROPOFOL N/A 02/10/2016   Procedure: COLONOSCOPY WITH PROPOFOL;  Surgeon: Hilarie FredricksonJohn N Perry, MD;  Location: [facility];  Service: Endoscopy;  Laterality: N/A;  . CYSTO/  LEFT RETROGRADE PYELOGRAM/  BALLOON DILATION LEFT URETER/  URETEROSCOPY/  STENT PLACEMENT  07/28/2000  . CYSTOSCOPY W/ URETERAL STENT PLACEMENT Right 07/15/2017   Procedure: CYSTOSCOPY WITH RETROGRADE PYELOGRAM/URETERAL STENT PLACEMENT;  Surgeon: Ihor Gullyttelin, Mark, MD;  [facility]: [facility];  Service: Urology;  Laterality: Right;  . ESOPHAGOGASTRODUODENOSCOPY    . EXPLORATION AND RE-DO CERVICAL FUSION C4--5 AND ANTERIOR CERVIAL DISKECTOMY FUSION C3--4  08/09/2007   POST-OP EXPLORATION AND EVACUATION RETROPHARYNGEAL HEMATOMA  . LAPAROSCOPIC CHOLECYSTECTOMY  1990'S  . LUMBAR LAMINECTOMY/DECOMPRESSION MICRODISCECTOMY  10/29/2011   Procedure: LUMBAR LAMINECTOMY/DECOMPRESSION MICRODISCECTOMY 1 LEVEL;  Surgeon: Mariam DollarGary P Cram, MD;  [facility];  Service: Neurosurgery;  Laterality: Right;  Right Lumbar Three-Four Lumbar Laminectomy/Microdiscectomy  . MAXIMUM ACCESS (MAS)POSTERIOR LUMBAR INTERBODY FUSION (PLIF) 1 LEVEL N/A 07/22/2014   Procedure: FOR MAXIMUM ACCESS (MAS) POSTERIOR LUMBAR INTERBODY FUSION (PLIF) 1 LEVEL;  Surgeon: Donalee CitrinGary Cram, MD;  [facility]: [facility];  Service: Neurosurgery;  Laterality: N/A;  FOR MAXIMUM ACCESS (MAS) POSTERIOR LUMBAR INTERBODY FUSION (PLIF) 1 LEVEL L4-5  . NEUROPLASTY / TRANSPOSITION ULNAR NERVE AT ELBOW Left 07/23/2008  . POSTERIOR  CERVICAL FUSION/FORAMINOTOMY  06/09/2011   Procedure: POSTERIOR CERVICAL FUSION/FORAMINOTOMY LEVEL 4;  Surgeon: Mariam DollarGary P Cram, MD;  [facility];  Service: Neurosurgery;  Laterality: N/A;  Cervical three to seven  posterior cervical fusion, Cervical four to seven Laminectomy, bone graft from right iliac crest   . POSTERIOR LUMBAR LAMINECTOMY AND DISKECTOMY  10/15/2009   L3 -- L4  . TONSILLECTOMY AND ADENOIDECTOMY  child  . TRANSTHORACIC ECHOCARDIOGRAM  11/27/2013   grade 1 diastolic dysfunction, ef 50-55%/  trivial AR and PR/ mild MR and TR/  PASP 31mmHg  . TRANSURETHRAL RESECTION OF PROSTATE N/A 05/31/2016   Procedure: TRANSURETHRAL RESECTION OF THE PROSTATE (TURP);  Surgeon: Jethro BolusSigmund Tannenbaum, MD;  [facility];  Service: Urology;  Laterality: N/A;    Current Outpatient Medications  Medication Sig Dispense Refill  . albuterol (PROVENTIL HFA;VENTOLIN HFA) 108 (90 Base) MCG/ACT inhaler Inhale 2 puffs into the lungs every 6 (six) hours as needed for wheezing or shortness of breath. 1 Inhaler 2  . atorvastatin (LIPITOR) 20 MG tablet TAKE 1 TABLET BY MOUTH DAILY (Patient taking differently: TAKE  1 TABLET (20 MG) BY MOUTH DAILY--- takes in am) 90 tablet 0  . carbidopa-levodopa (SINEMET IR) 25-100 MG tablet Take 1 tablet by mouth 3 (three) times daily. 90 tablet 5  . cephALEXin (KEFLEX) 500 MG capsule Take 1 capsule (500 mg total) by mouth 3 (three) times daily. 21 capsule 0  . clindamycin (CLEOCIN) 300 MG capsule Take 1 capsule (300 mg total) by mouth 3 (three) times daily. 21 capsule 0  . colchicine 0.6 MG tablet TAKE 1 TABLET BY MOUTH EVERY 2 HOURS UNTIL RELIEF as needed 30 tablet 0  . diazepam (VALIUM) 5 MG tablet Take 2.5-5 mg by mouth daily as needed (neck pain).     Marland Kitchen diclofenac sodium (VOLTAREN) 1 % GEL Apply 2 g 4 (four) times daily topically. 1 Tube 2  . febuxostat (ULORIC) 40 MG tablet Take 1 tablet (40 mg total) by mouth daily as needed. 30 tablet 2  .  Fluticasone-Umeclidin-Vilant (TRELEGY ELLIPTA) 100-62.5-25 MCG/INH AEPB Inhale 1 puff into the lungs daily. (Patient taking differently: Inhale 1 puff into the lungs every morning. ) 1 each 6  . furosemide (LASIX) 20 MG tablet Take 2 tablets (40 mg total) by mouth daily. (Patient taking differently: Take 40 mg by mouth every morning. ) 60 tablet 1  . ipratropium-albuterol (DUONEB) 0.5-2.5 (3) MG/3ML SOLN Take 3 mLs by nebulization every 6 (six) hours as needed. (Patient taking differently: Take 3 mLs by nebulization every 6 (six) hours as needed (wheezing/shortness of breath). ) 360 mL 2  . magnesium hydroxide (MILK OF MAGNESIA) 800 MG/5ML suspension Take by mouth daily as needed for constipation.    Marland Kitchen morphine (MSIR) 15 MG tablet Take 1 tablet (15 mg total) by mouth every 6 (six) hours as needed for severe pain. 12 tablet 0  . Multiple Vitamin (MULTIVITAMIN WITH MINERALS) TABS tablet Take 1 tablet by mouth daily.    . niacin 500 MG tablet Take 500 mg by mouth daily.     Marland Kitchen omeprazole (PRILOSEC) 20 MG capsule Take 1 capsule (20 mg total) by mouth every morning. (Patient taking differently: Take 20 mg by mouth every morning. ) 90 capsule 1  . Polyethyl Glycol-Propyl Glycol (SYSTANE OP) Place 2 drops into both eyes at bedtime as needed (For dry eyes.).     Marland Kitchen potassium chloride (KLOR-CON 10) 10 MEQ tablet Take 2 tablets (20 mEq total) by mouth daily. (Patient taking differently: Take 20 mEq by mouth every morning. ) 60 tablet 1  . Probiotic Product (PROBIOTIC PO) Take 1 capsule by mouth every morning.     . sertraline (ZOLOFT) 25 MG tablet TAKE 1 TABLET BY MOUTH DAILY 90 tablet 0  . tamsulosin (FLOMAX) 0.4 MG CAPS capsule Take 2 capsules (0.8 mg total) by mouth every morning. 60 capsule 0  . traMADol (ULTRAM) 50 MG tablet Take by mouth every 6 (six) hours as needed.    . silver sulfADIAZINE (SILVADENE) 1 % cream Apply 1 application topically daily. 50 g 0   Allergies  Allergen Reactions  .  Hydromorphone Itching  . Oxycodone Itching and Nausea Only    Social History   Socioeconomic History  . Marital status: Married  Social History Narrative   Lives at home w/ his wife   Right-handed   Drinks 1-2 sodas per day    Family History  Problem Relation Age of Onset  . Diabetes Mother   . Parkinson's disease Father   . Heart disease Father      Review of Systems:  Constitutional:  no unexpected change in weight, no weakness, no unexplained fevers, sweats, or chills Eye:  no recent significant change in vision Ear:  no hearing loss Nose/Mouth/Throat:  No dental complaints Neck/Thyroid:  no lumps or masses Pulmonary:  no chronic cough, sputum, or hemoptysis and no shortness of breath Cardiovascular:  no exercise intolerance, no chest pain Gastrointestinal:  no abdominal pain and no change in bowel habits GU:  negative for dysuria, frequency, and incontinence Musculoskeletal/Extremities:  no peripheral edema Skin/Integumentary ROS:  + new lesion on R buttock Neurologic:  no numbness, tingling, or tremor   Objective:   Vitals:   08/08/17 1250  BP: 118/80  Pulse: 91  Temp: 98.3 F (36.8 C)  TempSrc: Oral  SpO2: 95%  Weight: 223 lb (101.2 kg)  Height: 5\' 6"  (1.676 m)   Body mass index is 35.99 kg/m.  General:  well developed, well nourished, in no apparent distress Skin:  warm, no pallor or diaphoresis Head:  normocephalic, atraumatic Eyes:  pupils equal and round, sclera anicteric without injection Ears:  canals without lesions, TMs shiny without retraction, no obvious effusion, no erythema Throat/Pharynx:  lips and gingiva without lesion; tongue and uvula midline; non-inflamed pharynx; no exudates or postnasal drainage Neck: neck supple without adenopathy, thyromegaly, or masses, no bruits, no jugular venous distention Lungs:  clear to auscultation, breath sounds equal bilaterally, no respiratory distress Cardio:  regular rate and rhythm  Abdomen:   abdomen soft, nontender; bowel sounds normal; no masses, hepatomegaly or splenomegaly Extremities:  no clubbing, cyanosis, no deformities, no skin discoloration Neuro: No cerebellar signs Psych: Age appropriate judgment and insight; normal mood    Buttock  Assessment:   Pre-op exam  Pressure ulcer of sacral region, stage 1  Pressure injury of right buttock, stage 2 - Plan: silver sulfADIAZINE (SILVADENE) 1 % cream   Plan:   Orders as above. EKG - normal EKG, normal sinus rhythm, unchanged from previous tracings, he was tachycardic but had an infection The above laboratory work was ordered and will be sent with this physical. Pending the above workup, the patient is deemed low cardiac risk (1%) for the proposed procedure.   EKG from ER within past week reviewed. He did have a UTI Silvadene for wound. Offload area. I do not think that this will compromise his ability to safely have proposed procedure barring drainage or fevers. Renal function is in good standing as well.  F/u in 1 week to recheck skin. The patient voiced understanding and agreement to the plan.  Jilda Roche Whitakers, DO 08/08/17  1:39 PM

## 2017-08-09 ENCOUNTER — Telehealth: Payer: Self-pay | Admitting: *Deleted

## 2017-08-09 DIAGNOSIS — R338 Other retention of urine: Secondary | ICD-10-CM | POA: Diagnosis not present

## 2017-08-09 DIAGNOSIS — N401 Enlarged prostate with lower urinary tract symptoms: Secondary | ICD-10-CM | POA: Diagnosis not present

## 2017-08-09 DIAGNOSIS — B957 Other staphylococcus as the cause of diseases classified elsewhere: Secondary | ICD-10-CM | POA: Diagnosis not present

## 2017-08-09 DIAGNOSIS — A411 Sepsis due to other specified staphylococcus: Secondary | ICD-10-CM | POA: Diagnosis not present

## 2017-08-09 DIAGNOSIS — K219 Gastro-esophageal reflux disease without esophagitis: Secondary | ICD-10-CM | POA: Diagnosis not present

## 2017-08-09 DIAGNOSIS — G8929 Other chronic pain: Secondary | ICD-10-CM | POA: Diagnosis not present

## 2017-08-09 DIAGNOSIS — I2510 Atherosclerotic heart disease of native coronary artery without angina pectoris: Secondary | ICD-10-CM | POA: Diagnosis not present

## 2017-08-09 DIAGNOSIS — R6521 Severe sepsis with septic shock: Secondary | ICD-10-CM | POA: Diagnosis not present

## 2017-08-09 DIAGNOSIS — M1A9XX0 Chronic gout, unspecified, without tophus (tophi): Secondary | ICD-10-CM | POA: Diagnosis not present

## 2017-08-09 DIAGNOSIS — R918 Other nonspecific abnormal finding of lung field: Secondary | ICD-10-CM | POA: Diagnosis not present

## 2017-08-09 DIAGNOSIS — I5030 Unspecified diastolic (congestive) heart failure: Secondary | ICD-10-CM | POA: Diagnosis not present

## 2017-08-09 DIAGNOSIS — I700 Atherosclerosis of aorta: Secondary | ICD-10-CM | POA: Diagnosis not present

## 2017-08-09 DIAGNOSIS — J439 Emphysema, unspecified: Secondary | ICD-10-CM | POA: Diagnosis not present

## 2017-08-09 DIAGNOSIS — J9601 Acute respiratory failure with hypoxia: Secondary | ICD-10-CM | POA: Diagnosis not present

## 2017-08-09 DIAGNOSIS — M1990 Unspecified osteoarthritis, unspecified site: Secondary | ICD-10-CM | POA: Diagnosis not present

## 2017-08-09 DIAGNOSIS — N390 Urinary tract infection, site not specified: Secondary | ICD-10-CM | POA: Diagnosis not present

## 2017-08-09 DIAGNOSIS — M4802 Spinal stenosis, cervical region: Secondary | ICD-10-CM | POA: Diagnosis not present

## 2017-08-09 DIAGNOSIS — J479 Bronchiectasis, uncomplicated: Secondary | ICD-10-CM | POA: Diagnosis not present

## 2017-08-09 DIAGNOSIS — E785 Hyperlipidemia, unspecified: Secondary | ICD-10-CM | POA: Diagnosis not present

## 2017-08-09 DIAGNOSIS — M8580 Other specified disorders of bone density and structure, unspecified site: Secondary | ICD-10-CM | POA: Diagnosis not present

## 2017-08-09 NOTE — Telephone Encounter (Signed)
Received Physician Orders from [facility]; forwarded to provider/SLS 04/23  

## 2017-08-09 NOTE — Telephone Encounter (Signed)
[facility] forwarded to provider/SLS 04/23

## 2017-08-09 NOTE — H&P (Signed)
Urology Preoperative H&P   Chief Complaint: Right flank pain  History of Present Illness: Evan [name] is a 77 y.o. male with a 5 mm right UVJ stone, s/p right JJ stent placement on 07/15/17 secondary to urosepsis.  He is here today for definitive treatment of his right sided kidney stone.   Past Medical History:  Diagnosis Date  . Abnormality of gait    multiple cervical spondylosis  . Anxiety   . Bilateral leg weakness    due to back surgeries--  mostly uses wheelchair and at home very short distant w/ walker  . Bilateral lower extremity edema   . BPH (benign prostatic hyperplasia)   . Cervical spondylosis without myelopathy    per dr cram (neuro surg.)  . Chronic back pain   . Chronic bronchitis (HCC)   . Chronic gout    08-02-2017  per pt gout stable ,  last bout beginning March 2019  . Depression   . Diastolic CHF (HCC)   . Diverticulosis of colon   . Emphysema/COPD [facility](H[facility]) pulmologist-  dr Chilton Greathousepraveen mannam   last acute exacerbation 07-12-2017 (documented in epic)  . Full dentures   . GERD (gastroesophageal reflux disease)   . Hard of hearing    does not wear hearing aides  . Hemorrhoids   . History of sepsis    07-15-2017  urosepsis secondary to kidney stone extraction--  completed 14d IV vancomyocin   . Hyperlipidemia   . Lower urinary tract symptoms (LUTS)   . Nephrolithiasis    per CT 07-15-2017 multiple non-obstructive renal stones  . Numbness    hands --- related to neck  . OA (osteoarthritis)    hands  . Pressure ulcer of sacral region, stage 1    per pcp note in epic dated 07-29-2017  . RBBB (right bundle branch block)   . Right ureteral stone   . Self-catheterizes urinary bladder   . Wears glasses     Past Surgical History:  Procedure Laterality Date  . ABDOMINAL HERNIA REPAIR  1970's  . ANTERIOR CERVICAL DECOMP/DISCECTOMY FUSION  07/03/2001   C4 -- C5/  post op Exploration and evacuation cervical hematoma  . BLEPHAROPLASTY  2008  .  CARDIOVASCULAR STRESS TEST  05/27/1998   normal nuclear study w/ no ischemia/  normal LV function and wall motion , ef 64%  . CARPAL TUNNEL RELEASE Left 2003 approx.  . CERVICAL FUSION  1995   C5 -- C6  . COLONOSCOPY WITH PROPOFOL N/A 02/10/2016   Procedure: COLONOSCOPY WITH PROPOFOL;  Surgeon: Hilarie FredricksonJohn N Perry, MD;  [facility];  Service: Endoscopy;  Laterality: N/A;  . CYSTO/  LEFT RETROGRADE PYELOGRAM/  BALLOON DILATION LEFT URETER/  URETEROSCOPY/  STENT PLACEMENT  07/28/2000  . CYSTOSCOPY W/ URETERAL STENT PLACEMENT Right 07/15/2017   Procedure: CYSTOSCOPY WITH RETROGRADE PYELOGRAM/URETERAL STENT PLACEMENT;  Surgeon: Ihor Gullyttelin, Mark, MD;  Location[facility];  Service: Urology;  Laterality: Right;  . ESOPHAGOGASTRODUODENOSCOPY    . EXPLORATION AND RE-DO CERVICAL FUSION C4--5 AND ANTERIOR CERVIAL DISKECTOMY FUSION C3--4  08/09/2007   POST-OP EXPLORATION AND EVACUATION RETROPHARYNGEAL HEMATOMA  . LAPAROSCOPIC CHOLECYSTECTOMY  1990'S  . LUMBAR LAMINECTOMY/DECOMPRESSION MICRODISCECTOMY  10/29/2011   Procedure: LUMBAR LAMINECTOMY/DECOMPRESSION MICRODISCECTOMY 1 LEVEL;  Surgeon: Mariam DollarGary P Cram, MD;  [facility];  Service: Neurosurgery;  Laterality: Right;  Right Lumbar Three-Four Lumbar Laminectomy/Microdiscectomy  . MAXIMUM ACCESS (MAS)POSTERIOR LUMBAR INTERBODY FUSION (PLIF) 1 LEVEL N/A 07/22/2014   Procedure: FOR MAXIMUM ACCESS (MAS) POSTERIOR LUMBAR INTERBODY FUSION (  PLIF) 1 LEVEL;  Surgeon: Donalee Citrin, MD;  [facility];  Service: Neurosurgery;  Laterality: N/A;  FOR MAXIMUM ACCESS (MAS) POSTERIOR LUMBAR INTERBODY FUSION (PLIF) 1 LEVEL L4-5  . NEUROPLASTY / TRANSPOSITION ULNAR NERVE AT ELBOW Left 07/23/2008  . POSTERIOR CERVICAL FUSION/FORAMINOTOMY  06/09/2011   Procedure: POSTERIOR CERVICAL FUSION/FORAMINOTOMY LEVEL 4;  Surgeon: Mariam Dollar, MD;  [facility];  Service: Neurosurgery;  Laterality: N/A;  Cervical three to seven  posterior cervical fusion, Cervical four  to seven Laminectomy, bone graft from right iliac crest   . POSTERIOR LUMBAR LAMINECTOMY AND DISKECTOMY  10/15/2009   L3 -- L4  . TONSILLECTOMY AND ADENOIDECTOMY  child  . TRANSTHORACIC ECHOCARDIOGRAM  11/27/2013   grade 1 diastolic dysfunction, ef 50-55%/  trivial AR and PR/ mild MR and TR/  PASP  . TRANSURETHRAL RESECTION OF PROSTATE N/A 05/31/2016   Procedure: TRANSURETHRAL RESECTION OF THE PROSTATE (TURP);  Surgeon: Jethro Bolus, MD;  Location: [facility];  Service: Urology;  Laterality: N/A;    Allergies:  Allergies  Allergen Reactions  . Hydromorphone Itching  . Oxycodone Itching and Nausea Only    Family History  Problem Relation Age of Onset  . Diabetes Mother   . Parkinson's disease Father   . Heart disease Father     Social History:  reports that he quit smoking about 4 years ago. His smoking use included cigarettes. He has a 82.50 pack-year smoking history. He quit smokeless tobacco use about 6 months ago. His smokeless tobacco use included chew. He reports that he does not drink alcohol or use drugs.  ROS: A complete review of systems was performed.  All systems are negative except for pertinent findings as noted.  Physical Exam:  Vital signs in last 24 hours: Temp:  [98.3 F (36.8 C)] 98.3 F (36.8 C) (04/22 1250) Pulse Rate:  [91] 91 (04/22 1250) BP: (118)/(80) 118/80 (04/22 1250) SpO2:  [95 %] 95 % (04/22 1250) Weight:  [101.2 kg (223 lb)] 101.2 kg (223 lb) (04/22 1250) Constitutional:  Alert and oriented, No acute distress Cardiovascular: Regular rate and rhythm, No JVD Respiratory: Normal respiratory effort, Lungs clear bilaterally GI: Abdomen is soft, nontender, nondistended, no abdominal masses GU: No CVA tenderness Lymphatic: No lymphadenopathy Neurologic: Grossly intact, no focal deficits Psychiatric: Normal mood and affect  Laboratory Data:  No results for input(s): WBC, HGB, HCT, PLT in the last 72 hours.  No  results for input(s): NA, K, CL, GLUCOSE, BUN, CALCIUM, CREATININE in the last 72 hours.  Invalid input(s): CO3   No results found for this or any previous visit (from the past 24 hour(s)). Recent Results (from the past 240 hour(s))  Blood culture (routine x 2)     Status: None   Collection Time: 08/03/17  7:48 PM  Result Value Ref Range Status   Specimen Description   Final    BLOOD LEFT ANTECUBITAL Performed at [facility], [address] [place], [place] [address]    Special Requests   Final    BOTTLES DRAWN AEROBIC AND ANAEROBIC Blood Culture adequate volume Performed at [facility], [address], [place], [place] [address]    Culture   Final    NO GROWTH 5 DAYS Performed at [facility], [address], [place], [place] [address]    Report Status 08/08/2017 FINAL  Final  Urine culture     Status: Abnormal   Collection Time: 08/03/17  9:51 PM  Result Value Ref Range Status   Specimen Description   Final    URINE, RANDOM Performed at [facility], [address] [place], [place] [address]    Special Requests   Final    NONE Performed at [facility], [address], [place], [place] [address]    Culture >=100,000 COLONIES/mL PSEUDOMONAS AERUGINOSA (A)  Final   Report Status 08/06/2017 FINAL  Final   Organism ID, Bacteria PSEUDOMONAS AERUGINOSA (A)  Final      Susceptibility   Pseudomonas aeruginosa - MIC*    CEFTAZIDIME 4 SENSITIVE Sensitive     CIPROFLOXACIN 1 SENSITIVE Sensitive     GENTAMICIN 8 INTERMEDIATE Intermediate     IMIPENEM <=0.25 SENSITIVE Sensitive     PIP/TAZO 32 SENSITIVE Sensitive     CEFEPIME 16 INTERMEDIATE Intermediate     * >=100,000 COLONIES/mL PSEUDOMONAS AERUGINOSA    Renal Function: Recent Labs    08/03/17 1910  CREATININE 0.76   Estimated Creatinine Clearance: 87.6 mL/min (by C-G formula based on SCr of 0.76 mg/dL).  Radiologic Imaging: No  results found.  I independently reviewed the above imaging studies.  Assessment and Plan Evan [name] is a 77 y.o. male with a 5 mm right UVJ stone, s/p right JJ stent placement on 07/15/17  The risks, benefits and alternatives of cystoscopy with RIGHT ureteroscopy, laser lithotripsy and ureteral stent placement was discussed the patient.  Risks included, but are not limited to: bleeding, urinary tract infection, ureteral injury/avulsion, ureteral stricture formation, retained stone fragments, the possibility that multiple surgeries may be required to treat the stone(s), MI, stroke, PE and the inherent risks of general anesthesia.  The patient voices understanding and wishes to proceed.       Rhoderick Moody, MD 08/09/2017, 10:40 AM  [facility] Pager: [phone]

## 2017-08-10 ENCOUNTER — Other Ambulatory Visit: Payer: Self-pay | Admitting: Family Medicine

## 2017-08-10 ENCOUNTER — Ambulatory Visit (HOSPITAL_BASED_OUTPATIENT_CLINIC_OR_DEPARTMENT_OTHER): Payer: Medicare Other | Admitting: Anesthesiology

## 2017-08-10 ENCOUNTER — Other Ambulatory Visit: Payer: Self-pay

## 2017-08-10 ENCOUNTER — Encounter (HOSPITAL_BASED_OUTPATIENT_CLINIC_OR_DEPARTMENT_OTHER)
Admission: RE | Disposition: A | Discharge status: Home / Self Care | Payer: Self-pay | Source: Ambulatory Visit | Attending: Urology

## 2017-08-10 ENCOUNTER — Ambulatory Visit (HOSPITAL_BASED_OUTPATIENT_CLINIC_OR_DEPARTMENT_OTHER)
Admission: RE | Admit: 2017-08-10 | Discharge: 2017-08-10 | Disposition: A | Discharge status: Home / Self Care | Payer: Medicare Other | Source: Ambulatory Visit | Attending: Urology | Admitting: Urology

## 2017-08-10 ENCOUNTER — Encounter (HOSPITAL_BASED_OUTPATIENT_CLINIC_OR_DEPARTMENT_OTHER): Payer: Self-pay | Admitting: *Deleted

## 2017-08-10 DIAGNOSIS — E785 Hyperlipidemia, unspecified: Secondary | ICD-10-CM | POA: Insufficient documentation

## 2017-08-10 DIAGNOSIS — N400 Enlarged prostate without lower urinary tract symptoms: Secondary | ICD-10-CM | POA: Diagnosis not present

## 2017-08-10 DIAGNOSIS — Z87891 Personal history of nicotine dependence: Secondary | ICD-10-CM | POA: Insufficient documentation

## 2017-08-10 DIAGNOSIS — N201 Calculus of ureter: Secondary | ICD-10-CM | POA: Diagnosis not present

## 2017-08-10 DIAGNOSIS — F419 Anxiety disorder, unspecified: Secondary | ICD-10-CM | POA: Diagnosis not present

## 2017-08-10 DIAGNOSIS — I5030 Unspecified diastolic (congestive) heart failure: Secondary | ICD-10-CM | POA: Insufficient documentation

## 2017-08-10 DIAGNOSIS — M109 Gout, unspecified: Secondary | ICD-10-CM | POA: Diagnosis not present

## 2017-08-10 DIAGNOSIS — Z79899 Other long term (current) drug therapy: Secondary | ICD-10-CM | POA: Diagnosis not present

## 2017-08-10 DIAGNOSIS — I5032 Chronic diastolic (congestive) heart failure: Secondary | ICD-10-CM | POA: Diagnosis not present

## 2017-08-10 DIAGNOSIS — J449 Chronic obstructive pulmonary disease, unspecified: Secondary | ICD-10-CM | POA: Insufficient documentation

## 2017-08-10 DIAGNOSIS — Z466 Encounter for fitting and adjustment of urinary device: Secondary | ICD-10-CM | POA: Diagnosis not present

## 2017-08-10 HISTORY — DX: Personal history of other infectious and parasitic diseases: Z86.19

## 2017-08-10 HISTORY — PX: STENT REMOVAL: SHX6421

## 2017-08-10 HISTORY — DX: Unspecified chronic bronchitis: J42

## 2017-08-10 HISTORY — DX: Calculus of ureter: N20.1

## 2017-08-10 HISTORY — DX: Unspecified diastolic (congestive) heart failure: I50.30

## 2017-08-10 HISTORY — PX: CYSTOSCOPY/URETEROSCOPY/HOLMIUM LASER/STENT PLACEMENT: SHX6546

## 2017-08-10 HISTORY — DX: Localized edema: R60.0

## 2017-08-10 HISTORY — DX: Pressure ulcer of sacral region, stage 1: L89.151

## 2017-08-10 HISTORY — DX: Calculus of kidney: N20.0

## 2017-08-10 HISTORY — DX: Unspecified right bundle-branch block: I45.10

## 2017-08-10 LAB — GLUCOSE, CAPILLARY: Glucose-Capillary: 143 mg/dL — ABNORMAL HIGH (ref 65–99)

## 2017-08-10 SURGERY — CYSTOSCOPY/URETEROSCOPY/HOLMIUM LASER/STENT PLACEMENT
Anesthesia: General | Laterality: Right

## 2017-08-10 MED ORDER — EPHEDRINE SULFATE-NACL 50-0.9 MG/10ML-% IV SOSY
PREFILLED_SYRINGE | INTRAVENOUS | Status: DC | PRN
  Administered 2017-08-10: 10 mg via INTRAVENOUS
  Administered 2017-08-10: 20 mg via INTRAVENOUS
  Administered 2017-08-10 (×2): 10 mg via INTRAVENOUS

## 2017-08-10 MED ORDER — NYSTATIN 100000 UNIT/ML MT SUSP: 5.0000 mL | Freq: Four times a day (QID) | OROMUCOSAL | 0 refills | Status: DC

## 2017-08-10 MED ORDER — PHENAZOPYRIDINE HCL 200 MG PO TABS: 200.0000 mg | ORAL_TABLET | Freq: Three times a day (TID) | ORAL | 0 refills | Status: AC | PRN

## 2017-08-10 MED ORDER — PROMETHAZINE HCL 25 MG/ML IJ SOLN
6.2500 mg | INTRAMUSCULAR | Status: DC | PRN
  Filled 2017-08-10: qty 1

## 2017-08-10 MED ORDER — CEFAZOLIN SODIUM-DEXTROSE 2-4 GM/100ML-% IV SOLN
INTRAVENOUS | Status: AC
  Filled 2017-08-10: qty 100

## 2017-08-10 MED ORDER — FENTANYL CITRATE (PF) 100 MCG/2ML IJ SOLN
INTRAMUSCULAR | Status: AC
  Filled 2017-08-10: qty 2

## 2017-08-10 MED ORDER — DEXAMETHASONE SODIUM PHOSPHATE 4 MG/ML IJ SOLN
INTRAMUSCULAR | Status: DC | PRN
  Administered 2017-08-10 (×2): 4 mg via INTRAVENOUS

## 2017-08-10 MED ORDER — TRAMADOL HCL 50 MG PO TABS: 50.0000 mg | ORAL_TABLET | Freq: Four times a day (QID) | ORAL | 0 refills | Status: AC | PRN

## 2017-08-10 MED ORDER — SULFAMETHOXAZOLE-TRIMETHOPRIM 800-160 MG PO TABS: 1.0000 | ORAL_TABLET | Freq: Two times a day (BID) | ORAL | 0 refills | Status: AC

## 2017-08-10 MED ORDER — ONDANSETRON HCL 4 MG/2ML IJ SOLN
INTRAMUSCULAR | Status: DC | PRN
  Administered 2017-08-10: 4 mg via INTRAVENOUS

## 2017-08-10 MED ORDER — PHENYLEPHRINE 40 MCG/ML (10ML) SYRINGE FOR IV PUSH (FOR BLOOD PRESSURE SUPPORT)
PREFILLED_SYRINGE | INTRAVENOUS | Status: AC
  Filled 2017-08-10: qty 10

## 2017-08-10 MED ORDER — ONDANSETRON HCL 4 MG/2ML IJ SOLN
INTRAMUSCULAR | Status: AC
  Filled 2017-08-10: qty 2

## 2017-08-10 MED ORDER — FENTANYL CITRATE (PF) 100 MCG/2ML IJ SOLN
INTRAMUSCULAR | Status: DC | PRN
  Administered 2017-08-10 (×2): 25 ug via INTRAVENOUS

## 2017-08-10 MED ORDER — ONDANSETRON HCL 4 MG PO TABS: 4.0000 mg | ORAL_TABLET | Freq: Every day | ORAL | 1 refills | Status: DC | PRN

## 2017-08-10 MED ORDER — LACTATED RINGERS IV SOLN
INTRAVENOUS | Status: DC
  Administered 2017-08-10: 10:00:00 via INTRAVENOUS
  Filled 2017-08-10: qty 1000

## 2017-08-10 MED ORDER — DEXAMETHASONE SODIUM PHOSPHATE 10 MG/ML IJ SOLN
INTRAMUSCULAR | Status: AC
  Filled 2017-08-10: qty 1

## 2017-08-10 MED ORDER — FENTANYL CITRATE (PF) 100 MCG/2ML IJ SOLN
25.0000 ug | INTRAMUSCULAR | Status: DC | PRN
  Filled 2017-08-10: qty 1

## 2017-08-10 MED ORDER — EPHEDRINE 5 MG/ML INJ
INTRAVENOUS | Status: AC
  Filled 2017-08-10: qty 10

## 2017-08-10 MED ORDER — LIDOCAINE 2% (20 MG/ML) 5 ML SYRINGE
INTRAMUSCULAR | Status: AC
  Filled 2017-08-10: qty 5

## 2017-08-10 MED ORDER — CEFAZOLIN SODIUM-DEXTROSE 2-4 GM/100ML-% IV SOLN
2.0000 g | Freq: Once | INTRAVENOUS | Status: AC
  Administered 2017-08-10: 2 g via INTRAVENOUS
  Filled 2017-08-10: qty 100

## 2017-08-10 MED ORDER — ARTIFICIAL TEARS OPHTHALMIC OINT
TOPICAL_OINTMENT | OPHTHALMIC | Status: AC
  Filled 2017-08-10: qty 3.5

## 2017-08-10 MED ORDER — PROPOFOL 10 MG/ML IV BOLUS
INTRAVENOUS | Status: DC | PRN
  Administered 2017-08-10: 150 mg via INTRAVENOUS
  Administered 2017-08-10: 10 mg via INTRAVENOUS

## 2017-08-10 MED ORDER — LIDOCAINE 2% (20 MG/ML) 5 ML SYRINGE
INTRAMUSCULAR | Status: DC | PRN
  Administered 2017-08-10: 60 mg via INTRAVENOUS

## 2017-08-10 MED ORDER — PROPOFOL 10 MG/ML IV BOLUS
INTRAVENOUS | Status: AC
Start: 2017-08-10 — End: 2017-08-10
  Filled 2017-08-10: qty 40

## 2017-08-10 MED ORDER — PHENYLEPHRINE 40 MCG/ML (10ML) SYRINGE FOR IV PUSH (FOR BLOOD PRESSURE SUPPORT)
PREFILLED_SYRINGE | INTRAVENOUS | Status: DC | PRN
  Administered 2017-08-10: 80 ug via INTRAVENOUS
  Administered 2017-08-10: 160 ug via INTRAVENOUS
  Administered 2017-08-10: 80 ug via INTRAVENOUS

## 2017-08-10 SURGICAL SUPPLY — 33 items
APL SKNCLS STERI-STRIP NONHPOA (GAUZE/BANDAGES/DRESSINGS) ×2
BAG DRAIN URO-CYSTO SKYTR STRL (DRAIN) ×4 IMPLANT
BAG DRN UROCATH (DRAIN) ×2
BASKET STONE 1.7 NGAGE (UROLOGICAL SUPPLIES) IMPLANT
BASKET ZERO TIP NITINOL 2.4FR (BASKET) ×4 IMPLANT
BENZOIN TINCTURE PRP APPL 2/3 (GAUZE/BANDAGES/DRESSINGS) ×3 IMPLANT
BSKT STON RTRVL ZERO TP 2.4FR (BASKET) ×2
CATH FOLEY 2WAY SLVR  5CC 22FR (CATHETERS) ×2
CATH FOLEY 2WAY SLVR 5CC 22FR (CATHETERS) IMPLANT
CATH URET 5FR 28IN OPEN ENDED (CATHETERS) IMPLANT
CLOSURE WOUND 1/2 X4 (GAUZE/BANDAGES/DRESSINGS) ×1
CLOTH BEACON ORANGE TIMEOUT ST (SAFETY) ×4 IMPLANT
FIBER LASER FLEXIVA 365 (UROLOGICAL SUPPLIES) ×2 IMPLANT
FIBER LASER TRAC TIP (UROLOGICAL SUPPLIES) IMPLANT
GLOVE BIO SURGEON STRL SZ7.5 (GLOVE) ×4 IMPLANT
GOWN STRL REUS W/TWL XL LVL3 (GOWN DISPOSABLE) ×4 IMPLANT
GUIDEWIRE ANG ZIPWIRE 038X150 (WIRE) ×4 IMPLANT
GUIDEWIRE STR DUAL SENSOR (WIRE) IMPLANT
INFUSOR MANOMETER BAG 3000ML (MISCELLANEOUS) ×4 IMPLANT
IV NS 1000ML (IV SOLUTION)
IV NS 1000ML BAXH (IV SOLUTION) IMPLANT
IV NS IRRIG 3000ML ARTHROMATIC (IV SOLUTION) ×4 IMPLANT
KIT TURNOVER CYSTO (KITS) ×4 IMPLANT
MANIFOLD NEPTUNE II (INSTRUMENTS) ×4 IMPLANT
NS IRRIG 500ML POUR BTL (IV SOLUTION) ×2 IMPLANT
PACK CYSTO (CUSTOM PROCEDURE TRAY) ×4 IMPLANT
STENT URET 6FRX26 CONTOUR (STENTS) ×2 IMPLANT
STRIP CLOSURE SKIN 1/2X4 (GAUZE/BANDAGES/DRESSINGS) ×1 IMPLANT
SYRINGE 10CC LL (SYRINGE) ×4 IMPLANT
SYRINGE IRR TOOMEY STRL 70CC (SYRINGE) ×2 IMPLANT
TUBE CONNECTING 12'X1/4 (SUCTIONS) ×1
TUBE CONNECTING 12X1/4 (SUCTIONS) ×1 IMPLANT
WATER STERILE IRR 500ML POUR (IV SOLUTION) ×3 IMPLANT

## 2017-08-10 NOTE — Anesthesia Preprocedure Evaluation (Signed)
Anesthesia Evaluation  Patient identified by MRN, date of birth, ID band Patient awake    Reviewed: Allergy & Precautions, NPO status , Patient's Chart, lab work & pertinent test results  Airway Mallampati: II  TM Distance: >3 FB Neck ROM: Limited    Dental no notable dental hx.    Pulmonary COPD, former smoker,    breath sounds clear to auscultation + decreased breath sounds      Cardiovascular +CHF  Normal cardiovascular exam Rhythm:Regular Rate:Normal  EF 50-55%   Neuro/Psych parkinsons  Neuromuscular disease negative psych ROS   GI/Hepatic negative GI ROS, Neg liver ROS,   Endo/Other  negative endocrine ROS  Renal/GU negative Renal ROS  negative genitourinary   Musculoskeletal negative musculoskeletal ROS (+)   Abdominal   Peds negative pediatric ROS (+)  Hematology negative hematology ROS (+)   Anesthesia Other Findings   Reproductive/Obstetrics negative OB ROS                             Anesthesia Physical Anesthesia Plan  ASA: III  Anesthesia Plan: General   Post-op Pain Management:    Induction: Intravenous  PONV Risk Score and Plan: 2 and Ondansetron, Dexamethasone and Treatment may vary due to age or medical condition  Airway Management Planned: LMA  Additional Equipment:   Intra-op Plan:   Post-operative Plan: Extubation in OR  Informed Consent: I have reviewed the patients History and Physical, chart, labs and discussed the procedure including the risks, benefits and alternatives for the proposed anesthesia with the patient or authorized representative who has indicated his/her understanding and acceptance.   Dental advisory given  Plan Discussed with: CRNA and Surgeon  Anesthesia Plan Comments:         Anesthesia Quick Evaluation

## 2017-08-10 NOTE — Transfer of Care (Signed)
  Last Vitals:  Vitals Value Taken Time  BP 124/70 08/10/2017 11:19 AM  Temp    Pulse 87 08/10/2017 11:25 AM  Resp 17 08/10/2017 11:25 AM  SpO2 100 % 08/10/2017 11:25 AM  Vitals shown include unvalidated device data.  Last Pain:  Vitals:   08/10/17 0817  TempSrc: Oral      Patients Stated Pain Goal: 5 (08/10/17 0951)  Immediate Anesthesia Transfer of Care Note  Patient: Evan [name]  Procedure(s) Performed: Procedure(s) (LRB): CYSTOSCOPY/URETEROSCOPY/HOLMIUM LASER/STENT PLACEMENT (Right) STENT REMOVAL  Patient Location: PACU  Anesthesia Type: General  Level of Consciousness: awake, alert  and oriented  Airway & Oxygen Therapy: Patient Spontanous Breathing and Patient connected to face mask oxygen  Post-op Assessment: Report given to PACU RN and Post -op Vital signs reviewed and stable  Post vital signs: Reviewed and stable  Complications: No apparent anesthesia complications

## 2017-08-10 NOTE — Op Note (Addendum)
Operative Note  Preoperative diagnosis:  1.  5 mm right UVJ stone  Postoperative diagnosis: 1.  5 mm right UVJ stone 2.  Urethral bleeding following stent removal  Procedure(s): 1.  Cystoscopy 2.  Right JJ stent exchange 3.  Right ureteroscopy  4.  Right holmium laser lithotripsy   Surgeon: Rhoderick Moodyhristopher Harlie Ragle, MD  Assistants:  None   Anesthesia:  General LMA  Complications:  Urethral bleeding after removal of his previously placed JJ stent required a Foley catheter.  Following Foley placement, the bleeding ceased.  EBL:  20 mL   Specimens: 1. Right ureteral stone  Drains/Catheters: 1.  Right 6 French by 26 cm JJ stent with tether secured to 20 French Foley catheter  Intraoperative findings:   1. Obstructing right UVJ stone  Indication:  Evan [name] is a 77 y.o. male with a 5 mm right UVJ stone status post right JJ stent placement on 07/15/2017 secondary to urosepsis.  He is here today for definitive stone treatment.  The risk, benefits and alternatives of the above procedures was discussed with the patient.  He voices understanding and wishes to proceed.  Description of procedure:  After informed consent was obtained, the patient was brought to the operating room and general LMA anesthesia was administered. The patient was then placed in the dorsolithotomy position and prepped and draped in usual sterile fashion. A timeout was performed. A 23 French rigid cystoscope was then inserted into the urethral meatus and advanced into the bladder under direct vision. A complete bladder survey revealed no intravesical pathology.    The previously placed right JJ stent was grasped at its distal curl and retracted to the urethral meatus.  Following retraction of the stent, there was a steady drip of urethral bleeding likely from trauma from the stent being removed.  A Glidewire was then placed through the lumen of the previously placed stent and up to the right renal pelvis,  under fluoroscopic guidance.    The rigid cystoscope was then exchanged for a semirigid ureteroscope, which was advanced alongside the ureteral wire and up to the level of his distal stone.  A 365 m holmium laser was then used to fracture the stone into several smaller pieces.  A nitinol tipless basket was then used to extract all stone fragments from the lumen of the right ureter.  The ureteroscope was then exchanged for the cystoscope and all stone fragments were removed from the bladder.  The cystoscope was then advanced over the wire and back into the bladder and a 6 JamaicaFrench JJ stent was placed over the wire and into position within the right collecting system, confirming placement via fluoroscopy.  The tether of the stent was left intact.    A 20 French Foley catheter was then inserted and the tether of the stent was secured to it.  Pressure was held on the pendulous urethra following Foley catheter placement which quickly stopped the bleeding.  Patient tolerated the procedure well and was transferred to the postanesthesia in stable condition.  Plan: The patient has been instructed to remove his Foley catheter and right JJ stent at 6 AM on 08/15/2017.

## 2017-08-10 NOTE — Anesthesia Postprocedure Evaluation (Signed)
Anesthesia Post Note  Patient: Evan [name]  Procedure(s) Performed: CYSTOSCOPY/URETEROSCOPY/HOLMIUM LASER/STENT PLACEMENT (Right ) STENT REMOVAL     Patient location during evaluation: PACU Anesthesia Type: General Level of consciousness: awake and alert Pain management: pain level controlled Vital Signs Assessment: post-procedure vital signs reviewed and stable Respiratory status: spontaneous breathing, nonlabored ventilation, respiratory function stable and patient connected to nasal cannula oxygen Cardiovascular status: blood pressure returned to baseline and stable Postop Assessment: no apparent nausea or vomiting Anesthetic complications: no    Last Vitals:  Vitals:   08/10/17 1130 08/10/17 1145  BP: 115/65 121/64  Pulse: 90 90  Resp: 17 17  Temp:    SpO2: 100% 96%    Last Pain:  Vitals:   08/10/17 0817  TempSrc: Oral                 Latonia Conrow S

## 2017-08-10 NOTE — Anesthesia Procedure Notes (Signed)
Procedure Name: LMA Insertion Date/Time: 08/10/2017 10:27 AM Performed by: Eilene Ghaziose, George, MD Pre-anesthesia Checklist: Patient identified, Emergency Drugs available, Suction available and Patient being monitored Patient Re-evaluated:Patient Re-evaluated prior to induction Oxygen Delivery Method: Circle system utilized Preoxygenation: Pre-oxygenation with 100% oxygen Induction Type: IV induction Ventilation: Mask ventilation without difficulty LMA: LMA inserted LMA Size: 5.0 Number of attempts: 1 Airway Equipment and Method: Bite block Placement Confirmation: positive ETCO2 Tube secured with: Tape Dental Injury: Teeth and Oropharynx as per pre-operative assessment

## 2017-08-11 ENCOUNTER — Encounter (HOSPITAL_BASED_OUTPATIENT_CLINIC_OR_DEPARTMENT_OTHER): Payer: Self-pay | Admitting: Urology

## 2017-08-12 DIAGNOSIS — N401 Enlarged prostate with lower urinary tract symptoms: Secondary | ICD-10-CM | POA: Diagnosis not present

## 2017-08-12 DIAGNOSIS — I5030 Unspecified diastolic (congestive) heart failure: Secondary | ICD-10-CM | POA: Diagnosis not present

## 2017-08-12 DIAGNOSIS — G8929 Other chronic pain: Secondary | ICD-10-CM | POA: Diagnosis not present

## 2017-08-12 DIAGNOSIS — J439 Emphysema, unspecified: Secondary | ICD-10-CM | POA: Diagnosis not present

## 2017-08-12 DIAGNOSIS — I2510 Atherosclerotic heart disease of native coronary artery without angina pectoris: Secondary | ICD-10-CM | POA: Diagnosis not present

## 2017-08-12 DIAGNOSIS — N390 Urinary tract infection, site not specified: Secondary | ICD-10-CM | POA: Diagnosis not present

## 2017-08-12 DIAGNOSIS — R918 Other nonspecific abnormal finding of lung field: Secondary | ICD-10-CM | POA: Diagnosis not present

## 2017-08-12 DIAGNOSIS — M4802 Spinal stenosis, cervical region: Secondary | ICD-10-CM | POA: Diagnosis not present

## 2017-08-12 DIAGNOSIS — K219 Gastro-esophageal reflux disease without esophagitis: Secondary | ICD-10-CM | POA: Diagnosis not present

## 2017-08-12 DIAGNOSIS — R338 Other retention of urine: Secondary | ICD-10-CM | POA: Diagnosis not present

## 2017-08-12 DIAGNOSIS — J479 Bronchiectasis, uncomplicated: Secondary | ICD-10-CM | POA: Diagnosis not present

## 2017-08-12 DIAGNOSIS — E785 Hyperlipidemia, unspecified: Secondary | ICD-10-CM | POA: Diagnosis not present

## 2017-08-12 DIAGNOSIS — I700 Atherosclerosis of aorta: Secondary | ICD-10-CM | POA: Diagnosis not present

## 2017-08-12 DIAGNOSIS — B957 Other staphylococcus as the cause of diseases classified elsewhere: Secondary | ICD-10-CM | POA: Diagnosis not present

## 2017-08-12 DIAGNOSIS — R6521 Severe sepsis with septic shock: Secondary | ICD-10-CM | POA: Diagnosis not present

## 2017-08-12 DIAGNOSIS — M1990 Unspecified osteoarthritis, unspecified site: Secondary | ICD-10-CM | POA: Diagnosis not present

## 2017-08-12 DIAGNOSIS — A411 Sepsis due to other specified staphylococcus: Secondary | ICD-10-CM | POA: Diagnosis not present

## 2017-08-12 DIAGNOSIS — M1A9XX0 Chronic gout, unspecified, without tophus (tophi): Secondary | ICD-10-CM | POA: Diagnosis not present

## 2017-08-12 DIAGNOSIS — M8580 Other specified disorders of bone density and structure, unspecified site: Secondary | ICD-10-CM | POA: Diagnosis not present

## 2017-08-12 DIAGNOSIS — J9601 Acute respiratory failure with hypoxia: Secondary | ICD-10-CM | POA: Diagnosis not present

## 2017-08-15 ENCOUNTER — Ambulatory Visit: Payer: Medicare Other | Admitting: Family Medicine

## 2017-08-15 ENCOUNTER — Telehealth: Payer: Self-pay | Admitting: Family Medicine

## 2017-08-15 NOTE — Telephone Encounter (Signed)
Copied from CRM [id number]. Topic: Quick Communication - See Telephone Encounter >> Aug 15, 2017  9:41 AM Floria Raveling A wrote: CRM for notification. See Telephone encounter for: 08/15/17. Princess Katrinka Blazing with [facility] [phone] They are having a hard time confirming appt.  Pt Keeps  Cancelling appts

## 2017-08-15 NOTE — Telephone Encounter (Signed)
Noted. Will discuss at today's appt. Ty.

## 2017-08-16 ENCOUNTER — Other Ambulatory Visit: Payer: Self-pay | Admitting: Family Medicine

## 2017-08-17 DIAGNOSIS — R300 Dysuria: Secondary | ICD-10-CM | POA: Diagnosis not present

## 2017-08-17 DIAGNOSIS — R3914 Feeling of incomplete bladder emptying: Secondary | ICD-10-CM | POA: Diagnosis not present

## 2017-08-18 ENCOUNTER — Telehealth: Payer: Self-pay | Admitting: Family Medicine

## 2017-08-18 DIAGNOSIS — A411 Sepsis due to other specified staphylococcus: Secondary | ICD-10-CM | POA: Diagnosis not present

## 2017-08-18 DIAGNOSIS — N401 Enlarged prostate with lower urinary tract symptoms: Secondary | ICD-10-CM | POA: Diagnosis not present

## 2017-08-18 DIAGNOSIS — R338 Other retention of urine: Secondary | ICD-10-CM | POA: Diagnosis not present

## 2017-08-18 DIAGNOSIS — I5030 Unspecified diastolic (congestive) heart failure: Secondary | ICD-10-CM | POA: Diagnosis not present

## 2017-08-18 DIAGNOSIS — K219 Gastro-esophageal reflux disease without esophagitis: Secondary | ICD-10-CM | POA: Diagnosis not present

## 2017-08-18 DIAGNOSIS — M1A9XX0 Chronic gout, unspecified, without tophus (tophi): Secondary | ICD-10-CM | POA: Diagnosis not present

## 2017-08-18 DIAGNOSIS — R6521 Severe sepsis with septic shock: Secondary | ICD-10-CM | POA: Diagnosis not present

## 2017-08-18 DIAGNOSIS — J479 Bronchiectasis, uncomplicated: Secondary | ICD-10-CM | POA: Diagnosis not present

## 2017-08-18 DIAGNOSIS — B957 Other staphylococcus as the cause of diseases classified elsewhere: Secondary | ICD-10-CM | POA: Diagnosis not present

## 2017-08-18 DIAGNOSIS — R918 Other nonspecific abnormal finding of lung field: Secondary | ICD-10-CM | POA: Diagnosis not present

## 2017-08-18 DIAGNOSIS — M1990 Unspecified osteoarthritis, unspecified site: Secondary | ICD-10-CM | POA: Diagnosis not present

## 2017-08-18 DIAGNOSIS — G8929 Other chronic pain: Secondary | ICD-10-CM | POA: Diagnosis not present

## 2017-08-18 DIAGNOSIS — E785 Hyperlipidemia, unspecified: Secondary | ICD-10-CM | POA: Diagnosis not present

## 2017-08-18 DIAGNOSIS — M8580 Other specified disorders of bone density and structure, unspecified site: Secondary | ICD-10-CM | POA: Diagnosis not present

## 2017-08-18 DIAGNOSIS — I700 Atherosclerosis of aorta: Secondary | ICD-10-CM | POA: Diagnosis not present

## 2017-08-18 DIAGNOSIS — J439 Emphysema, unspecified: Secondary | ICD-10-CM | POA: Diagnosis not present

## 2017-08-18 DIAGNOSIS — N390 Urinary tract infection, site not specified: Secondary | ICD-10-CM | POA: Diagnosis not present

## 2017-08-18 DIAGNOSIS — M4802 Spinal stenosis, cervical region: Secondary | ICD-10-CM | POA: Diagnosis not present

## 2017-08-18 DIAGNOSIS — I2510 Atherosclerotic heart disease of native coronary artery without angina pectoris: Secondary | ICD-10-CM | POA: Diagnosis not present

## 2017-08-18 DIAGNOSIS — J9601 Acute respiratory failure with hypoxia: Secondary | ICD-10-CM | POA: Diagnosis not present

## 2017-08-18 NOTE — Telephone Encounter (Signed)
Copied from CRM #[id number] Topic: General - Other >> Aug 18, 2017 10:26 AM Maia Petties wrote: Reason for CRM: Pt experiencing more pain during urination and not feeling well. Pt may have UTI. Pt had CYSTOSCOPY last week at urology. He is on bactrim from urology. Requesting VO for UA & culture or if Dr. Carmelia Roller has another suggestion. Pt is only having bowel movements every 3-4 days, less if he is not encouraged to use warm prune juice and kaopectate. Requesting that something be prescribed to help with bowels. Please call to follow up. She is just arriving at pts home now. OK to leave detailed msg if no answer on secure voicemail.

## 2017-08-19 ENCOUNTER — Telehealth: Payer: Self-pay | Admitting: *Deleted

## 2017-08-19 NOTE — Telephone Encounter (Signed)
Came across message while completing paperwork for patient and spoke with Dr. Carmelia Roller about it. LMOM with contact name and number [for return call, if needed] RE: VO given per provider for UA & Culture for suspected UTI and also Ok to use prune juice and kaopectate per further provider instructions/SLS 05/03

## 2017-08-19 NOTE — Telephone Encounter (Signed)
Received Physician Orders from [facility] for Decubitus Care Pad [Gel] with SMN & Evaluation of Need note; forwarded to provider with required last 6-mths OV notes/SLS 05/03

## 2017-08-23 DIAGNOSIS — M5137 Other intervertebral disc degeneration, lumbosacral region: Secondary | ICD-10-CM | POA: Diagnosis not present

## 2017-08-23 DIAGNOSIS — M48061 Spinal stenosis, lumbar region without neurogenic claudication: Secondary | ICD-10-CM | POA: Diagnosis not present

## 2017-08-24 DIAGNOSIS — J9601 Acute respiratory failure with hypoxia: Secondary | ICD-10-CM | POA: Diagnosis not present

## 2017-08-24 DIAGNOSIS — E785 Hyperlipidemia, unspecified: Secondary | ICD-10-CM | POA: Diagnosis not present

## 2017-08-24 DIAGNOSIS — M1A9XX0 Chronic gout, unspecified, without tophus (tophi): Secondary | ICD-10-CM | POA: Diagnosis not present

## 2017-08-24 DIAGNOSIS — B957 Other staphylococcus as the cause of diseases classified elsewhere: Secondary | ICD-10-CM | POA: Diagnosis not present

## 2017-08-24 DIAGNOSIS — K219 Gastro-esophageal reflux disease without esophagitis: Secondary | ICD-10-CM | POA: Diagnosis not present

## 2017-08-24 DIAGNOSIS — M1990 Unspecified osteoarthritis, unspecified site: Secondary | ICD-10-CM | POA: Diagnosis not present

## 2017-08-24 DIAGNOSIS — I2510 Atherosclerotic heart disease of native coronary artery without angina pectoris: Secondary | ICD-10-CM | POA: Diagnosis not present

## 2017-08-24 DIAGNOSIS — N401 Enlarged prostate with lower urinary tract symptoms: Secondary | ICD-10-CM | POA: Diagnosis not present

## 2017-08-24 DIAGNOSIS — R338 Other retention of urine: Secondary | ICD-10-CM | POA: Diagnosis not present

## 2017-08-24 DIAGNOSIS — J479 Bronchiectasis, uncomplicated: Secondary | ICD-10-CM | POA: Diagnosis not present

## 2017-08-24 DIAGNOSIS — A411 Sepsis due to other specified staphylococcus: Secondary | ICD-10-CM | POA: Diagnosis not present

## 2017-08-24 DIAGNOSIS — R6521 Severe sepsis with septic shock: Secondary | ICD-10-CM | POA: Diagnosis not present

## 2017-08-24 DIAGNOSIS — M4802 Spinal stenosis, cervical region: Secondary | ICD-10-CM | POA: Diagnosis not present

## 2017-08-24 DIAGNOSIS — M8580 Other specified disorders of bone density and structure, unspecified site: Secondary | ICD-10-CM | POA: Diagnosis not present

## 2017-08-24 DIAGNOSIS — I700 Atherosclerosis of aorta: Secondary | ICD-10-CM | POA: Diagnosis not present

## 2017-08-24 DIAGNOSIS — N390 Urinary tract infection, site not specified: Secondary | ICD-10-CM | POA: Diagnosis not present

## 2017-08-24 DIAGNOSIS — G8929 Other chronic pain: Secondary | ICD-10-CM | POA: Diagnosis not present

## 2017-08-24 DIAGNOSIS — R918 Other nonspecific abnormal finding of lung field: Secondary | ICD-10-CM | POA: Diagnosis not present

## 2017-08-24 DIAGNOSIS — I5030 Unspecified diastolic (congestive) heart failure: Secondary | ICD-10-CM | POA: Diagnosis not present

## 2017-08-24 DIAGNOSIS — J439 Emphysema, unspecified: Secondary | ICD-10-CM | POA: Diagnosis not present

## 2017-08-25 DIAGNOSIS — G8929 Other chronic pain: Secondary | ICD-10-CM | POA: Diagnosis not present

## 2017-08-25 DIAGNOSIS — M1990 Unspecified osteoarthritis, unspecified site: Secondary | ICD-10-CM | POA: Diagnosis not present

## 2017-08-25 DIAGNOSIS — J9601 Acute respiratory failure with hypoxia: Secondary | ICD-10-CM | POA: Diagnosis not present

## 2017-08-25 DIAGNOSIS — B957 Other staphylococcus as the cause of diseases classified elsewhere: Secondary | ICD-10-CM | POA: Diagnosis not present

## 2017-08-25 DIAGNOSIS — N390 Urinary tract infection, site not specified: Secondary | ICD-10-CM | POA: Diagnosis not present

## 2017-08-25 DIAGNOSIS — M8580 Other specified disorders of bone density and structure, unspecified site: Secondary | ICD-10-CM | POA: Diagnosis not present

## 2017-08-25 DIAGNOSIS — R338 Other retention of urine: Secondary | ICD-10-CM | POA: Diagnosis not present

## 2017-08-25 DIAGNOSIS — I2510 Atherosclerotic heart disease of native coronary artery without angina pectoris: Secondary | ICD-10-CM | POA: Diagnosis not present

## 2017-08-25 DIAGNOSIS — R6521 Severe sepsis with septic shock: Secondary | ICD-10-CM | POA: Diagnosis not present

## 2017-08-25 DIAGNOSIS — E785 Hyperlipidemia, unspecified: Secondary | ICD-10-CM | POA: Diagnosis not present

## 2017-08-25 DIAGNOSIS — N401 Enlarged prostate with lower urinary tract symptoms: Secondary | ICD-10-CM | POA: Diagnosis not present

## 2017-08-25 DIAGNOSIS — K219 Gastro-esophageal reflux disease without esophagitis: Secondary | ICD-10-CM | POA: Diagnosis not present

## 2017-08-25 DIAGNOSIS — A411 Sepsis due to other specified staphylococcus: Secondary | ICD-10-CM | POA: Diagnosis not present

## 2017-08-25 DIAGNOSIS — I5030 Unspecified diastolic (congestive) heart failure: Secondary | ICD-10-CM | POA: Diagnosis not present

## 2017-08-25 DIAGNOSIS — J479 Bronchiectasis, uncomplicated: Secondary | ICD-10-CM | POA: Diagnosis not present

## 2017-08-25 DIAGNOSIS — M4802 Spinal stenosis, cervical region: Secondary | ICD-10-CM | POA: Diagnosis not present

## 2017-08-25 DIAGNOSIS — M1A9XX0 Chronic gout, unspecified, without tophus (tophi): Secondary | ICD-10-CM | POA: Diagnosis not present

## 2017-08-25 DIAGNOSIS — R918 Other nonspecific abnormal finding of lung field: Secondary | ICD-10-CM | POA: Diagnosis not present

## 2017-08-25 DIAGNOSIS — J439 Emphysema, unspecified: Secondary | ICD-10-CM | POA: Diagnosis not present

## 2017-08-25 DIAGNOSIS — I700 Atherosclerosis of aorta: Secondary | ICD-10-CM | POA: Diagnosis not present

## 2017-08-29 ENCOUNTER — Encounter: Payer: Self-pay | Admitting: Pulmonary Disease

## 2017-08-29 ENCOUNTER — Ambulatory Visit: Payer: Medicare Other | Admitting: Pulmonary Disease

## 2017-08-29 VITALS — BP 108/70 | HR 80 | Ht 66.0 in | Wt 220.0 lb

## 2017-08-29 DIAGNOSIS — M48061 Spinal stenosis, lumbar region without neurogenic claudication: Secondary | ICD-10-CM | POA: Diagnosis not present

## 2017-08-29 DIAGNOSIS — J439 Emphysema, unspecified: Secondary | ICD-10-CM

## 2017-08-29 DIAGNOSIS — M5137 Other intervertebral disc degeneration, lumbosacral region: Secondary | ICD-10-CM | POA: Diagnosis not present

## 2017-08-29 DIAGNOSIS — J42 Unspecified chronic bronchitis: Secondary | ICD-10-CM | POA: Diagnosis not present

## 2017-08-29 LAB — NITRIC OXIDE: NITRIC OXIDE: 47

## 2017-08-29 NOTE — Patient Instructions (Signed)
I am glad that your breathing is doing well Continue your inhalers and nebulizer I will see her back in 6 months.  Please call us sooner if there is a change in your symptoms.

## 2017-08-29 NOTE — Progress Notes (Signed)
Evan [name]    [id number]    03/31/1941  Primary Care Physician:Wendling, Jilda Roche, DO  Referring Physician: Sharlene Dory, DO [address] [place], [place] [address]  Chief complaint:  Follow up for emphysema, chronic bronchitis Asthma  HPI: 77 year old heavy smoker with emphysema, chronic bronchitis.  He has complained of cough for the past 3-4 months with white mucus, dyspnea, wheezing.  Denies any fevers, chills.  He was seen by primary care and given doxycycline and prednisone. He is on albuterol inhaler and never been on controller medication.  He was evaluated in the pulmonary clinic in 2015 and PFTs ordered but did not get done  Pets: 2 dogs.  No birds, farm animals Occupation: Retired Academic librarian, Corporate investment banker Exposures: Exposure to asbestos while working in [employer] Smoking history: 150-pack-year smoking history.  Quit in 2016 Travel History: Not significant  Interim history: Hospitalized in April 2019 for septic shock, ureteral stone with hydronephrosis and coag negative bacteremia States that breathing is doing well.  He has occasional wheezing otherwise no cough, sputum production, congestion.  Outpatient Encounter Medications as of 08/29/2017  Medication Sig  . albuterol (PROVENTIL HFA;VENTOLIN HFA) 108 (90 Base) MCG/ACT inhaler Inhale 2 puffs into the lungs every 6 (six) hours as needed for wheezing or shortness of breath.  Marland Kitchen atorvastatin (LIPITOR) 20 MG tablet TAKE 1 TABLET BY MOUTH DAILY (Patient taking differently: TAKE 1 TABLET (20 MG) BY MOUTH DAILY--- takes in am)  . carbidopa-levodopa (SINEMET IR) 25-100 MG tablet Take 1 tablet by mouth 3 (three) times daily.  . colchicine 0.6 MG tablet TAKE 1 TABLET BY MOUTH EVERY 2 HOURS UNTIL RELIEF as needed  . diazepam (VALIUM) 5 MG tablet Take 2.5-5 mg by mouth daily as needed (neck pain).   Marland Kitchen diclofenac sodium (VOLTAREN) 1 % GEL Apply 2 g 4 (four) times daily  topically.  . febuxostat (ULORIC) 40 MG tablet Take 1 tablet (40 mg total) by mouth daily as needed.  . Fluticasone-Umeclidin-Vilant (TRELEGY ELLIPTA) 100-62.5-25 MCG/INH AEPB Inhale 1 puff into the lungs daily. (Patient taking differently: Inhale 1 puff into the lungs every morning. )  . furosemide (LASIX) 20 MG tablet Take 2 tablets (40 mg total) by mouth daily. (Patient taking differently: Take 40 mg by mouth every morning. )  . ipratropium-albuterol (DUONEB) 0.5-2.5 (3) MG/3ML SOLN Take 3 mLs by nebulization every 6 (six) hours as needed. (Patient taking differently: Take 3 mLs by nebulization every 6 (six) hours as needed (wheezing/shortness of breath). )  . magnesium hydroxide (MILK OF MAGNESIA) 800 MG/5ML suspension Take by mouth daily as needed for constipation.  Marland Kitchen morphine (MSIR) 15 MG tablet Take 1 tablet (15 mg total) by mouth every 6 (six) hours as needed for severe pain.  . Multiple Vitamin (MULTIVITAMIN WITH MINERALS) TABS tablet Take 1 tablet by mouth daily.  . niacin 500 MG tablet Take 500 mg by mouth daily.   Marland Kitchen nystatin (MYCOSTATIN) 100000 UNIT/ML suspension Take 5 mLs (500,000 Units total) by mouth 4 (four) times daily.  Marland Kitchen omeprazole (PRILOSEC) 20 MG capsule Take 1 capsule (20 mg total) by mouth every morning. (Patient taking differently: Take 20 mg by mouth every morning. )  . ondansetron (ZOFRAN) 4 MG tablet Take 1 tablet (4 mg total) by mouth daily as needed for nausea or vomiting.  . phenazopyridine (PYRIDIUM) 200 MG tablet Take 1 tablet (200 mg total) by mouth 3 (three) times daily as needed for  pain.  . Polyethyl Glycol-Propyl Glycol (SYSTANE OP) Place 2 drops into both eyes at bedtime as needed (For dry eyes.).   Marland Kitchen potassium chloride (KLOR-CON 10) 10 MEQ tablet Take 2 tablets (20 mEq total) by mouth daily. (Patient taking differently: Take 20 mEq by mouth every morning. )  . Probiotic Product (PROBIOTIC PO) Take 1 capsule by mouth every morning.   . sertraline (ZOLOFT) 25 MG  tablet TAKE 1 TABLET BY MOUTH DAILY  . silver sulfADIAZINE (SILVADENE) 1 % cream Apply 1 application topically daily.  . tamsulosin (FLOMAX) 0.4 MG CAPS capsule Take 2 capsules (0.8 mg total) by mouth every morning.  . traMADol (ULTRAM) 50 MG tablet Take by mouth every 6 (six) hours as needed.  . [DISCONTINUED] omeprazole (PRILOSEC) 20 MG capsule TAKE 1 CAPSULE BY MOUTH EACH MORNING   No facility-administered encounter medications on file as of 08/29/2017.     Allergies as of 08/29/2017 - Review Complete 08/29/2017  Allergen Reaction Noted  . Hydromorphone Itching 12/20/2014  . Oxycodone Itching and Nausea Only 10/27/2011    Past Medical History:  Diagnosis Date  . Abnormality of gait    multiple cervical spondylosis  . Anxiety   . Bilateral leg weakness    due to back surgeries--  mostly uses wheelchair and at home very short distant w/ walker  . Bilateral lower extremity edema   . BPH (benign prostatic hyperplasia)   . Cervical spondylosis without myelopathy    per dr cram (neuro surg.)  . Chronic back pain   . Chronic bronchitis (HCC)   . Chronic gout    08-02-2017  per pt gout stable ,  last bout beginning March 2019  . Depression   . Diastolic CHF (HCC)   . Diverticulosis of colon   . Emphysema/COPD [facility]) pulmologist-  dr Chilton Greathouse   last acute exacerbation 07-12-2017 (documented in epic)  . Full dentures   . GERD (gastroesophageal reflux disease)   . Hard of hearing    does not wear hearing aides  . Hemorrhoids   . History of sepsis    07-15-2017  urosepsis secondary to kidney stone extraction--  completed 14d IV vancomyocin   . Hyperlipidemia   . Lower urinary tract symptoms (LUTS)   . Nephrolithiasis    per CT 07-15-2017 multiple non-obstructive renal stones  . Numbness    hands --- related to neck  . OA (osteoarthritis)    hands  . Pressure ulcer of sacral region, stage 1    per pcp note in epic dated 07-29-2017  . RBBB (right bundle branch block)   .  Right ureteral stone   . Self-catheterizes urinary bladder   . Wears glasses     Past Surgical History:  Procedure Laterality Date  . ABDOMINAL HERNIA REPAIR  1970's  . ANTERIOR CERVICAL DECOMP/DISCECTOMY FUSION  07/03/2001   C4 -- C5/  post op Exploration and evacuation cervical hematoma  . BLEPHAROPLASTY  2008  . CARDIOVASCULAR STRESS TEST  05/27/1998   normal nuclear study w/ no ischemia/  normal LV function and wall motion , ef 64%  . CARPAL TUNNEL RELEASE Left 2003 approx.  . CERVICAL FUSION  1995   C5 -- C6  . COLONOSCOPY WITH PROPOFOL N/A 02/10/2016   Procedure: COLONOSCOPY WITH PROPOFOL;  Surgeon: Hilarie Fredrickson, MD;  Location: [facility];  Service: Endoscopy;  Laterality: N/A;  . CYSTO/  LEFT RETROGRADE PYELOGRAM/  BALLOON DILATION LEFT URETER/  URETEROSCOPY/  STENT PLACEMENT  07/28/2000  .  CYSTOSCOPY W/ URETERAL STENT PLACEMENT Right 07/15/2017   Procedure: CYSTOSCOPY WITH RETROGRADE PYELOGRAM/URETERAL STENT PLACEMENT;  Surgeon: Ihor Gully, MD;  Location: [facility];  Service: Urology;  Laterality: Right;  . CYSTOSCOPY/URETEROSCOPY/HOLMIUM LASER/STENT PLACEMENT Right 08/10/2017   Procedure: CYSTOSCOPY/URETEROSCOPY/HOLMIUM LASER/STENT PLACEMENT;  Surgeon: Rene Paci, MD;  Location: [facility];  Service: Urology;  Laterality: Right;  ONLY NEEDS 45 MIN FOR PROCEDURE  . ESOPHAGOGASTRODUODENOSCOPY    . EXPLORATION AND RE-DO CERVICAL FUSION C4--5 AND ANTERIOR CERVIAL DISKECTOMY FUSION C3--4  08/09/2007   POST-OP EXPLORATION AND EVACUATION RETROPHARYNGEAL HEMATOMA  . LAPAROSCOPIC CHOLECYSTECTOMY  1990'S  . LUMBAR LAMINECTOMY/DECOMPRESSION MICRODISCECTOMY  10/29/2011   Procedure: LUMBAR LAMINECTOMY/DECOMPRESSION MICRODISCECTOMY 1 LEVEL;  Surgeon: Mariam Dollar, MD;  Location: [facility];  Service: Neurosurgery;  Laterality: Right;  Right Lumbar Three-Four Lumbar Laminectomy/Microdiscectomy  . MAXIMUM ACCESS (MAS)POSTERIOR LUMBAR INTERBODY FUSION (PLIF) 1  LEVEL N/A 07/22/2014   Procedure: FOR MAXIMUM ACCESS (MAS) POSTERIOR LUMBAR INTERBODY FUSION (PLIF) 1 LEVEL;  Surgeon: Donalee Citrin, MD;  Location: [facility];  Service: Neurosurgery;  Laterality: N/A;  FOR MAXIMUM ACCESS (MAS) POSTERIOR LUMBAR INTERBODY FUSION (PLIF) 1 LEVEL L4-5  . NEUROPLASTY / TRANSPOSITION ULNAR NERVE AT ELBOW Left 07/23/2008  . POSTERIOR CERVICAL FUSION/FORAMINOTOMY  06/09/2011   Procedure: POSTERIOR CERVICAL FUSION/FORAMINOTOMY LEVEL 4;  Surgeon: Mariam Dollar, MD;  Location: [facility];  Service: Neurosurgery;  Laterality: N/A;  Cervical three to seven  posterior cervical fusion, Cervical four to seven Laminectomy, bone graft from right iliac crest   . POSTERIOR LUMBAR LAMINECTOMY AND DISKECTOMY  10/15/2009   L3 -- L4  . STENT REMOVAL  08/10/2017   Procedure: STENT REMOVAL;  Surgeon: Rene Paci, MD;  Location: [facility];  Service: Urology;;  . TONSILLECTOMY AND ADENOIDECTOMY  child  . TRANSTHORACIC ECHOCARDIOGRAM  11/27/2013   grade 1 diastolic dysfunction, ef 50-55%/  trivial AR and PR/ mild MR and TR/  PASP  . TRANSURETHRAL RESECTION OF PROSTATE N/A 05/31/2016   Procedure: TRANSURETHRAL RESECTION OF THE PROSTATE (TURP);  Surgeon: Jethro Bolus, MD;  Location: [facility];  Service: Urology;  Laterality: N/A;    Family History  Problem Relation Age of Onset  . Diabetes Mother   . Parkinson's disease Father   . Heart disease Father     Social History   Socioeconomic History  . Marital status: Married    Spouse name: Not on file  . Number of children: 3  . Years of education: 60  . Highest education level: Not on file  Occupational History  . Occupation: Retired  Engineer, production  . Financial resource strain: Not on file  . Food insecurity:    Worry: Not on file    Inability: Not on file  . Transportation needs:    Medical: Not on file    Non-medical: Not on file  Tobacco Use  . Smoking status: Former  Smoker    Packs/day: 1.50    Years: 55.00    Pack years: 82.50    Types: Cigarettes    Last attempt to quit: 05/27/2013    Years since quitting: 4.2  . Smokeless tobacco: Former Neurosurgeon    Types: Chew    Quit date: 02/01/2017  Substance and Sexual Activity  . Alcohol use: No    Alcohol/week: 0.0 oz  . Drug use: No  . Sexual activity: Not Currently  Lifestyle  . Physical activity:    Days per week: Not on file  Minutes per session: Not on file  . Stress: Not on file  Relationships  . Social connections:    Talks on phone: Not on file    Gets together: Not on file    Attends religious service: Not on file    Active member of club or organization: Not on file    Attends meetings of clubs or organizations: Not on file    Relationship status: Not on file  . Intimate partner violence:    Fear of current or ex partner: Not on file    Emotionally abused: Not on file    Physically abused: Not on file    Forced sexual activity: Not on file  Other Topics Concern  . Not on file  Social History Narrative   Lives at home w/ his wife   Right-handed   Drinks 1-2 sodas per day    Review of systems: Review of Systems  Constitutional: Negative for fever and chills.  HENT: Negative.   Eyes: Negative for blurred vision.  Respiratory: as per HPI  Cardiovascular: Negative for chest pain and palpitations.  Gastrointestinal: Negative for vomiting, diarrhea, blood per rectum. Genitourinary: Negative for dysuria, urgency, frequency and hematuria.  Musculoskeletal: Negative for myalgias, back pain and joint pain.  Skin: Negative for itching and rash.  Neurological: Negative for dizziness, tremors, focal weakness, seizures and loss of consciousness.  Endo/Heme/Allergies: Negative for environmental allergies.  Psychiatric/Behavioral: Negative for depression, suicidal ideas and hallucinations.  All other systems reviewed and are negative.  Physical Exam: Blood pressure 108/70, pulse 80,  height  (1.676 m), weight 220 lb (99.8 kg), SpO2 93 %. Gen:      No acute distress HEENT:  EOMI, sclera anicteric Neck:     No masses; no thyromegaly Lungs:    Clear to auscultation bilaterally; normal respiratory effort CV:         Regular rate and rhythm; no murmurs Abd:      + bowel sounds; soft, non-tender; no palpable masses, no distension Ext:    No edema; adequate peripheral perfusion Skin:      Warm and dry; no rash Neuro: alert and oriented x 3 Psych: normal mood and affect  Data Reviewed: Low-dose screening CT of the chest 07/19/16- emphysema, left base scarring.  Scattered tiny pulmonary nodules. Low-dose screening CT of the chest 02/08/17-stable emphysema, left base scarring and stable pulmonary nodules. I have reviewed the images personally.  PFTs 05/31/17 FVC 2.73 [77%], FEV1 2.08 [82%], F/F 76, TLC 84%, DLCO 54% Mild obstruction, moderate diffusion defect  FENO  07/12/17-88  09/15/2017-47  CBC 07/12/2017-WBC 9, eos 0 Blood allergy profile 07/12/2017-IgE 157, RAST panel is negative  Assessment:  Emphysema, chronic bronchitis Moderate persistent asthma.    FENO this slightly elevated today but continues to improve compared to last visit Symptoms are stable.  He has not tolerated stopping of his controller medication in the past.   Continue on trelegy and nebulizer, albuterol as needed.   Continue Mucinex and flutter valve.   PFTs show only minimal-mild obstruction in though he has significant smoking history.  He has reduced DLCO which corrects for alveolar volume.  There is no evidence of interstitial lung disease on recent CT of the chest except for minimal left base scarring that is stable.  Sub Cm pulmonary nodules Continue annual low-dose screening CT.  Health maintenance 03/29/2017-influenza 12/18/2013- Pneumovax 01/21/2015-Prevnar 13  Plan/Recommendations: - Continue Trelegy, albuterol nebulizer and duo nebs as needed - Annual CT of the chest.  Chilton Greathouse MD [facility] 08/29/2017, 11:46 AM  CC: Sharlene Dory*

## 2017-08-31 ENCOUNTER — Other Ambulatory Visit: Payer: Self-pay | Admitting: Family Medicine

## 2017-08-31 ENCOUNTER — Telehealth: Payer: Self-pay | Admitting: Family Medicine

## 2017-08-31 DIAGNOSIS — R918 Other nonspecific abnormal finding of lung field: Secondary | ICD-10-CM | POA: Diagnosis not present

## 2017-08-31 DIAGNOSIS — I5030 Unspecified diastolic (congestive) heart failure: Secondary | ICD-10-CM | POA: Diagnosis not present

## 2017-08-31 DIAGNOSIS — J479 Bronchiectasis, uncomplicated: Secondary | ICD-10-CM | POA: Diagnosis not present

## 2017-08-31 DIAGNOSIS — K219 Gastro-esophageal reflux disease without esophagitis: Secondary | ICD-10-CM | POA: Diagnosis not present

## 2017-08-31 DIAGNOSIS — R6521 Severe sepsis with septic shock: Secondary | ICD-10-CM | POA: Diagnosis not present

## 2017-08-31 DIAGNOSIS — M8580 Other specified disorders of bone density and structure, unspecified site: Secondary | ICD-10-CM | POA: Diagnosis not present

## 2017-08-31 DIAGNOSIS — E785 Hyperlipidemia, unspecified: Secondary | ICD-10-CM | POA: Diagnosis not present

## 2017-08-31 DIAGNOSIS — A411 Sepsis due to other specified staphylococcus: Secondary | ICD-10-CM | POA: Diagnosis not present

## 2017-08-31 DIAGNOSIS — I2510 Atherosclerotic heart disease of native coronary artery without angina pectoris: Secondary | ICD-10-CM | POA: Diagnosis not present

## 2017-08-31 DIAGNOSIS — R338 Other retention of urine: Secondary | ICD-10-CM | POA: Diagnosis not present

## 2017-08-31 DIAGNOSIS — B957 Other staphylococcus as the cause of diseases classified elsewhere: Secondary | ICD-10-CM | POA: Diagnosis not present

## 2017-08-31 DIAGNOSIS — G8929 Other chronic pain: Secondary | ICD-10-CM | POA: Diagnosis not present

## 2017-08-31 DIAGNOSIS — N390 Urinary tract infection, site not specified: Secondary | ICD-10-CM | POA: Diagnosis not present

## 2017-08-31 DIAGNOSIS — M4802 Spinal stenosis, cervical region: Secondary | ICD-10-CM | POA: Diagnosis not present

## 2017-08-31 DIAGNOSIS — N401 Enlarged prostate with lower urinary tract symptoms: Secondary | ICD-10-CM | POA: Diagnosis not present

## 2017-08-31 DIAGNOSIS — M1A9XX0 Chronic gout, unspecified, without tophus (tophi): Secondary | ICD-10-CM | POA: Diagnosis not present

## 2017-08-31 DIAGNOSIS — M1990 Unspecified osteoarthritis, unspecified site: Secondary | ICD-10-CM | POA: Diagnosis not present

## 2017-08-31 DIAGNOSIS — I700 Atherosclerosis of aorta: Secondary | ICD-10-CM | POA: Diagnosis not present

## 2017-08-31 DIAGNOSIS — J9601 Acute respiratory failure with hypoxia: Secondary | ICD-10-CM | POA: Diagnosis not present

## 2017-08-31 DIAGNOSIS — J439 Emphysema, unspecified: Secondary | ICD-10-CM | POA: Diagnosis not present

## 2017-08-31 NOTE — Telephone Encounter (Signed)
There is no message or phone number

## 2017-08-31 NOTE — Telephone Encounter (Signed)
Copied from CRM [id number]. Topic: Quick Communication - See Telephone Encounter >> Aug 31, 2017  1:13 PM Rudi Coco, [place] wrote: CRM for notification. See Telephone encounter for: 08/31/17.  [facility]

## 2017-09-02 DIAGNOSIS — J42 Unspecified chronic bronchitis: Secondary | ICD-10-CM | POA: Diagnosis not present

## 2017-09-13 ENCOUNTER — Other Ambulatory Visit: Payer: Self-pay | Admitting: Family Medicine

## 2017-09-13 ENCOUNTER — Ambulatory Visit (INDEPENDENT_AMBULATORY_CARE_PROVIDER_SITE_OTHER): Payer: Medicare Other

## 2017-09-13 DIAGNOSIS — Z23 Encounter for immunization: Secondary | ICD-10-CM

## 2017-09-13 DIAGNOSIS — J440 Chronic obstructive pulmonary disease with acute lower respiratory infection: Principal | ICD-10-CM

## 2017-09-13 DIAGNOSIS — J449 Chronic obstructive pulmonary disease, unspecified: Secondary | ICD-10-CM

## 2017-09-13 DIAGNOSIS — I509 Heart failure, unspecified: Secondary | ICD-10-CM

## 2017-09-13 DIAGNOSIS — J209 Acute bronchitis, unspecified: Secondary | ICD-10-CM

## 2017-09-15 ENCOUNTER — Telehealth: Payer: Self-pay | Admitting: Family Medicine

## 2017-09-15 DIAGNOSIS — J440 Chronic obstructive pulmonary disease with acute lower respiratory infection: Principal | ICD-10-CM

## 2017-09-15 DIAGNOSIS — J209 Acute bronchitis, unspecified: Secondary | ICD-10-CM

## 2017-09-15 DIAGNOSIS — I509 Heart failure, unspecified: Secondary | ICD-10-CM

## 2017-09-15 NOTE — Telephone Encounter (Signed)
Home Health referral done/

## 2017-09-20 DIAGNOSIS — Z87442 Personal history of urinary calculi: Secondary | ICD-10-CM | POA: Diagnosis not present

## 2017-09-20 DIAGNOSIS — R3914 Feeling of incomplete bladder emptying: Secondary | ICD-10-CM | POA: Diagnosis not present

## 2017-09-20 DIAGNOSIS — N401 Enlarged prostate with lower urinary tract symptoms: Secondary | ICD-10-CM | POA: Diagnosis not present

## 2017-09-20 DIAGNOSIS — N281 Cyst of kidney, acquired: Secondary | ICD-10-CM | POA: Diagnosis not present

## 2017-10-03 DIAGNOSIS — J42 Unspecified chronic bronchitis: Secondary | ICD-10-CM | POA: Diagnosis not present

## 2017-10-10 ENCOUNTER — Ambulatory Visit: Payer: Self-pay | Admitting: *Deleted

## 2017-10-10 NOTE — Telephone Encounter (Signed)
Pt reports episodes of hemoptysis x 2 days. States "In mornings only when clearing my throat." States "only a few drops."  Does not occur at any other time throughout day. States sputum is light brown, "That's the usual color." Denies fever, SOB, cough; no other bleeding evident.  H/O emphysema/COPD. States he has been "Eating aspirin like candy," takes for back pain. Pt states has stopped since noting blood in sputum. Pt unable to make appt until Thursday due to transportation issues. Appt made with Dr. Carmelia RollerWendling for Thursday. Instructed pt to call back if symptoms worsen.   Reason for Disposition . [1] More than one episode of coughing up blood AND [2] < 1 tablespoon (15 ml) of pure red blood  Answer Assessment - Initial Assessment Questions 1. ONSET: "When did you start coughing up blood?"     2 days ago 2. SEVERITY: "How many times?" "How much blood?" (e.g., flecks, streaks, tablespoons, etc)     "Few drops" only in mornings. 3. COUGHING SPASMS: "Did the blood appear after a coughing spell?"       "When I clear my throat in AM only." 4. RESPIRATORY DISTRESS: "Describe your breathing."      no 5. FEVER: "Do you have a fever?" If so, ask: "What is your temperature, how was it measured, and when did it start?"     no 6. SPUTUM: "Describe the color of your sputum" (clear, white, yellow, green), "Has there been any change recently?"     Pale brown 7. CARDIAC HISTORY: "Do you have any history of heart disease?" (e.g., heart attack, congestive heart failure)      CHF 8. LUNG HISTORY: "Do you have any history of lung disease?"  (e.g., pulmonary embolus, asthma, emphysema)     Emphysema/COPD 9. PE RISK FACTORS: "Do you have a history of blood clots?" (or: recent major surgery, recent prolonged travel, bedridden )     no 10. OTHER SYMPTOMS: "Do you have any other symptoms?" (e.g., nosebleed, chest pain, abdominal pain, vomiting)       no  Protocols used: COUGHING UP BLOOD-A-AH

## 2017-10-13 ENCOUNTER — Ambulatory Visit: Payer: Medicare Other | Admitting: Family Medicine

## 2017-11-02 ENCOUNTER — Other Ambulatory Visit: Payer: Self-pay | Admitting: Family Medicine

## 2017-11-02 DIAGNOSIS — J42 Unspecified chronic bronchitis: Secondary | ICD-10-CM | POA: Diagnosis not present

## 2017-11-07 ENCOUNTER — Telehealth: Payer: Self-pay | Admitting: Pulmonary Disease

## 2017-11-07 NOTE — Telephone Encounter (Signed)
Spoke with patient. He stated that Trelegy will cost him $100 a month. He can not afford this. He still has [employer]. Reviewed his medication formulary and it appears that the following are covered:   Advair, Anoro, Bevespi, Breo, Incruse, Spiriva, Stiolto and Symbicort.   Dr. Isaiah SergeMannam, please advise if you wish to switch the patient to something more affordable or to see if we can get him setup with patient assistance. Thanks!

## 2017-11-08 NOTE — Telephone Encounter (Signed)
Let him know that if we switch to a different inhalers we will need two meds to cover trelegy which may end up costing him more. Lets try patient assistance.

## 2017-11-08 NOTE — Telephone Encounter (Signed)
Called and spoke to pt. Informed him of the recs per PM. Pt is requesting pt assistance forms be mailed to him. This has been placed in out going mail. Pt aware these need to be returned to our office as soon as he is able.  [facility].

## 2017-11-14 NOTE — Telephone Encounter (Signed)
Attempted to call patient today regarding if pt rec'd assistance forms in mail at this time. I did not receive an answer at time of call. I have left a voicemail message for pt to return call. X1

## 2017-11-14 NOTE — Telephone Encounter (Signed)
Patient's wife called and states she did receive assistance forms and will try to get this back out in the mail tomorrow.  Lupita LeashDonna, pt wife, [phone].

## 2017-11-14 NOTE — Telephone Encounter (Signed)
Called and spoke with patients wife, she has completed forms and will have them mailed back to us. Will forward to [facility] to follow up on once we receive these.

## 2017-11-17 NOTE — Telephone Encounter (Signed)
Forms have not received as of yet.  Will continue to f/u.

## 2017-11-21 DIAGNOSIS — R339 Retention of urine, unspecified: Secondary | ICD-10-CM | POA: Diagnosis not present

## 2017-11-22 ENCOUNTER — Other Ambulatory Visit: Payer: Self-pay

## 2017-11-22 MED ORDER — FLUTICASONE-UMECLIDIN-VILANT 100-62.5-25 MCG/INH IN AEPB: 1.0000 | INHALATION_SPRAY | Freq: Every day | RESPIRATORY_TRACT | 3 refills | Status: DC

## 2017-11-22 NOTE — Telephone Encounter (Signed)
LMOM TCB x1 to see if the patient has even mailed out the forms yet

## 2017-11-22 NOTE — Telephone Encounter (Signed)
I received the forms and will fax with Trelegy RX. I will keep encounter open until we have confirmation that it was received.

## 2017-11-23 ENCOUNTER — Other Ambulatory Visit: Payer: Self-pay | Admitting: Family Medicine

## 2017-11-23 NOTE — Telephone Encounter (Signed)
No formal confirmation has been rec'd at this time  Waiting for approval for pt assistant forms to be approved for rx trelegy inhaler Will be closing message as the fax was completed Routing message to [facility] f/u.

## 2017-11-28 ENCOUNTER — Telehealth: Payer: Self-pay | Admitting: Pulmonary Disease

## 2017-11-28 MED ORDER — FLUTICASONE FUROATE-VILANTEROL 200-25 MCG/INH IN AEPB: 1.0000 | INHALATION_SPRAY | Freq: Every day | RESPIRATORY_TRACT | 3 refills | Status: DC

## 2017-11-28 NOTE — Telephone Encounter (Signed)
Called and spoke with pt regarding Dr. Shirlee MoreMannam's recommendations Placed order to [facility] for Breo 200 Pt verbalized understanding, and had no further questions nor concerns Nothing further needed.

## 2017-11-28 NOTE — Telephone Encounter (Signed)
Called [employer] @ [phone] and spoke with rep (unable to discern name) Per rep, pt has been DENIED for Trelegy patient assistance d/t answers in Section 2, questions 1&3 regarding patient's insurance coverage.  Decision was made on 11/24/17, letter will be mailed to patient.  Called spoke with patient's spouse Evan [name] (dpr on file) and discussed the above Per Evan Leashonna, pt has about 1 week left of the Trelegy samples   Also per Evan Leashonna patient has had some increased symptoms x1-2 days with increased cough that produced pink mucus 11/27/17 x1 episode.  Denied any increased dyspnea, wheezing, f/c/s, chest tightness or chest pain.  Dr Silvestre GunnerMannan please advise if alternative therapy needs to be determined.  Thank you.

## 2017-11-28 NOTE — Telephone Encounter (Signed)
Try Breo 200

## 2017-12-02 NOTE — Telephone Encounter (Signed)
Called and spoke to Evan [name] with [employer], who states pt was denied for assistance due to private insurance.  I have checked pt's insurance on file, which showed [facility].  Rodman PickleCassidy stated that pt checked that he has private insurance, medicare part D and affordable act.   lmtcb x1 to confirm insurance with pt.  Forms will need to be re submitted if he does not have private insurance.

## 2017-12-03 DIAGNOSIS — J42 Unspecified chronic bronchitis: Secondary | ICD-10-CM | POA: Diagnosis not present

## 2017-12-07 NOTE — Telephone Encounter (Signed)
Please see 11/28/17 phone note. Will close encounter, as nothing further is needed.

## 2017-12-14 ENCOUNTER — Telehealth: Payer: Self-pay | Admitting: Family Medicine

## 2017-12-14 NOTE — Telephone Encounter (Signed)
Copied from CRM [id number]. Topic: General - Other >> Dec 13, 2017 10:11 AM Percival SpanishKennedy, Cheryl W wrote:  Pt wife call and said they received a call from Dr Carmelia RollerWendling nurse and would like a call back  Called left message to call back. See phone note

## 2017-12-15 NOTE — Telephone Encounter (Signed)
Patient wife is requesting a call back. Please advise

## 2017-12-16 ENCOUNTER — Telehealth: Payer: Self-pay | Admitting: Family Medicine

## 2017-12-16 MED ORDER — ALLOPURINOL 100 MG PO TABS: 100.0000 mg | ORAL_TABLET | Freq: Every day | ORAL | 6 refills | Status: DC

## 2017-12-16 NOTE — Telephone Encounter (Signed)
-----   Message from Pincus Sanesobin B Sarafina Puthoff, CMA sent at 12/14/2017 10:26 AM EDT ----- Regarding: FW: [place] Gout Initiative  Called left message to call back. ----- Message ----- From: Pincus SanesEwing, Janisa Labus B, CMA Sent: 12/12/2017   5:03 PM EDT To: Vladimir Croftsobin B Scotland Dost, CMA Subject: FW: [place] Gout Initiative                 Called left message to call back. ----- Message ----- From: Sharlene DoryWendling, Nicholas Paul, DO Sent: 12/09/2017  12:55 PM EDT To: Vladimir Croftsobin B Mayci Haning, CMA Subject: FW: [place] Gout Initiative                 Can we reach out to to pt to see if he is interested in this? TY. ----- Message ----- From: Dellis FilbertStickel, Jessica, Student-PharmD Sent: 11/10/2017   5:46 PM EDT To: Thomes CakeWendy Sun, Barbaraann CaoStudent-PharmD, # Subject: [facility]                     Hi Dr. Carmelia RollerWendling,   I am contacting you on behalf of a medication initiative approved by [facility]. Your patient, Evan [name], is on febuxostat (Uloric) and has CHF. The FDA released a new black box warning for febuxostat regarding a higher rate of CV deaths in patients with CVD.   The black box warning suggests that febuxostat be reserved in patients with inadequate response to maximally titrated allopurinol (Zyloprim) or patients with a known intolerance or contraindication to allopurinol.     It looks like [name] is currently taking his febuxostat on an as-needed basis and does not appear to have filled the prescription recently. It also looks like he tried allopurinol in the past, but I do not see notes about whether or not it was successful for him.  Please consider discussing risks vs. benefits with the patient and discontinuing febuxostat, especially if the patient is not taking it consistently.  Additional options for managing his gout include switching to allopurinol if maintenance of urate-lowering therapy is preferred, or avoiding agents which can increase uric acid, like niacin.  Please contact me if  assistance may be needed with facilitating potential changes or educating the patient.   Thank you for your consideration.  Greer PickerelJessica Stickel, PharmD Candidate

## 2017-12-16 NOTE — Telephone Encounter (Signed)
See telephone note/staff message.

## 2017-12-16 NOTE — Telephone Encounter (Signed)
Scheduled appt with PCP---wife stated patient needs to discuss gettting some referrals/can do those labs that day.

## 2017-12-16 NOTE — Telephone Encounter (Signed)
Spoke to the patients wife and explained message--she verbalized understanding and stated ok to change. Also verbalized understanding to have the patient stop uloric

## 2017-12-16 NOTE — Telephone Encounter (Signed)
Allopurinol called in, please schedule pt in 4 weeks for labs and order uric acid and BMP. TY.

## 2018-01-03 DIAGNOSIS — J42 Unspecified chronic bronchitis: Secondary | ICD-10-CM | POA: Diagnosis not present

## 2018-01-11 DIAGNOSIS — R339 Retention of urine, unspecified: Secondary | ICD-10-CM | POA: Diagnosis not present

## 2018-01-16 ENCOUNTER — Encounter: Payer: Self-pay | Admitting: Family Medicine

## 2018-01-16 ENCOUNTER — Ambulatory Visit (INDEPENDENT_AMBULATORY_CARE_PROVIDER_SITE_OTHER): Payer: Medicare Other | Admitting: Family Medicine

## 2018-01-16 VITALS — BP 120/64 | HR 71 | Temp 98.3°F | Ht 66.0 in | Wt 195.0 lb

## 2018-01-16 DIAGNOSIS — Z23 Encounter for immunization: Secondary | ICD-10-CM | POA: Diagnosis not present

## 2018-01-16 DIAGNOSIS — H9191 Unspecified hearing loss, right ear: Secondary | ICD-10-CM | POA: Insufficient documentation

## 2018-01-16 DIAGNOSIS — L720 Epidermal cyst: Secondary | ICD-10-CM | POA: Diagnosis not present

## 2018-01-16 NOTE — Patient Instructions (Signed)
If you do not hear anything about your referral in the next 1-2 weeks, call our office and ask for an update.  Let us know if you need anything.  

## 2018-01-16 NOTE — Progress Notes (Signed)
Chief Complaint  Patient presents with  . Follow-up    Subjective: Patient is a 77 y.o. male here for f/u shot and skin issue.  Area on back that is causing pain. No inj or new topicals. Denies fevers. Wife states that white stuff will come out of it.   R hearing loss. Sees audiology. Not compliant with his hearing aids. Will sometimes have wax buildup.   ROS: Heart: Denies chest pain  Lungs: Denies SOB   Past Medical History:  Diagnosis Date  . Abnormality of gait    multiple cervical spondylosis  . Anxiety   . Bilateral leg weakness    due to back surgeries--  mostly uses wheelchair and at home very short distant w/ walker  . Bilateral lower extremity edema   . BPH (benign prostatic hyperplasia)   . Cervical spondylosis without myelopathy    per dr cram (neuro surg.)  . Chronic back pain   . Chronic bronchitis (HCC)   . Chronic gout    08-02-2017  per pt gout stable ,  last bout beginning March 2019  . Depression   . Diastolic CHF (HCC)   . Diverticulosis of colon   . Emphysema/COPD [facility]) pulmologist-  dr Chilton Greathouse   last acute exacerbation 07-12-2017 (documented in epic)  . Full dentures   . GERD (gastroesophageal reflux disease)   . Hard of hearing    does not wear hearing aides  . Hemorrhoids   . History of sepsis    07-15-2017  urosepsis secondary to kidney stone extraction--  completed 14d IV vancomyocin   . Hyperlipidemia   . Lower urinary tract symptoms (LUTS)   . Nephrolithiasis    per CT 07-15-2017 multiple non-obstructive renal stones  . Numbness    hands --- related to neck  . OA (osteoarthritis)    hands  . Pressure ulcer of sacral region, stage 1    per pcp note in epic dated 07-29-2017  . RBBB (right bundle branch block)   . Right ureteral stone   . Self-catheterizes urinary bladder   . Wears glasses     Objective: BP 120/64 (BP Location: Left Arm, Patient Position: Sitting, Cuff Size: Normal)   Pulse 71   Temp 98.3 F (36.8 C)  (Oral)   Ht 5\' 6"  (1.676 m)   Wt 195 lb (88.5 kg)   SpO2 98%   BMI 31.47 kg/m  General: Awake, appears stated age HEENT: ear canal patent and TM neg on L, mild obstruction with cerumen 40%, TM neg on R Skin: Circular mass under flesh colored skin, R upper back, mildly ttp, no erythema, drainage, excessive warmth Lungs: No accessory muscle use Psych: Age appropriate judgment and insight, normal affect and mood  Assessment and Plan: Hearing loss of right ear, unspecified hearing loss type - Plan: Ambulatory referral to ENT  Epidermal cyst  Need for influenza vaccination - Plan: Flu vaccine HIGH DOSE PF (Fluzone High dose)  Pt requesting referral to ENT to ensure nothing else is going on with hearing. Has not been compliant with hearing aids. Will remove cyst at his request, suggested he ck w insurance, but it is causing him pain now.  F/u at earliest convenience for removal.  The patient voiced understanding and agreement to the plan.  Jilda Roche Thayne, DO 01/16/18  1:38 PM

## 2018-01-16 NOTE — Progress Notes (Signed)
Pre visit review using our clinic review tool, if applicable. No additional management support is needed unless otherwise documented below in the visit note. 

## 2018-01-27 ENCOUNTER — Encounter: Payer: Self-pay | Admitting: Family Medicine

## 2018-01-27 ENCOUNTER — Ambulatory Visit (INDEPENDENT_AMBULATORY_CARE_PROVIDER_SITE_OTHER): Payer: Medicare Other | Admitting: Family Medicine

## 2018-01-27 DIAGNOSIS — L720 Epidermal cyst: Secondary | ICD-10-CM

## 2018-01-27 NOTE — Progress Notes (Signed)
CC: Procedure.  Here for removal of cyst.   Gen- awake Psych- age appropriate judgment and insight  Procedure note; incision and drainage Informed consent obtained. The area was cleaned with alcohol. The area was anesthetized with 2 mL of 1% lidocaine with epinephrine. Once adequate anesthesia was obtained, a vertical incision was made with 15 blade scalpel. It was dissected down to capsule, realized I could not safely get entire capsule. Sebum expressed, portions of capsule removed. Hemostasis ensured. 2 horizontal mattress sutures laid with 4-0 Prolene with adequate edge approximation. There were no complications noted. The patient tolerated the procedure well.  Epidermal cyst - Plan: PR DRAIN SKIN ABSCESS SIMPLE  Excision relatively successful. Aftercare instructions verbalized and written down. F/u in 1 week to reck area and remove suture.  Pt voiced understanding and agreement to the plan.  Jilda Roche Dearl Rudden 4:56 PM 01/27/18

## 2018-01-27 NOTE — Patient Instructions (Signed)
Do not shower for the rest of the day. When you do wash it, use only soap and water. Do not vigorously scrub. Apply triple antibiotic ointment (like Neosporin) twice daily. Keep the area clean and dry.   Things to look out for: increasing pain not relieved by ibuprofen/acetaminophen, fevers, spreading redness, drainage of pus, or foul odor.  Let us know if you need anything. 

## 2018-02-02 ENCOUNTER — Encounter: Payer: Self-pay | Admitting: Family Medicine

## 2018-02-02 ENCOUNTER — Ambulatory Visit (INDEPENDENT_AMBULATORY_CARE_PROVIDER_SITE_OTHER): Payer: Medicare Other | Admitting: Family Medicine

## 2018-02-02 DIAGNOSIS — J42 Unspecified chronic bronchitis: Secondary | ICD-10-CM | POA: Diagnosis not present

## 2018-02-02 DIAGNOSIS — L720 Epidermal cyst: Secondary | ICD-10-CM

## 2018-02-02 NOTE — Progress Notes (Signed)
Patient here for suture removal.  2 horizontal mattress sutures removed without issue.  The skin lesion looks well-healed, a Band-Aid was placed.  All questions answered.  Follow-up as originally scheduled this point.  The patient voiced understanding and agreement to the plan.  Jilda Roche Bradlee Bridgers 4:05 PM 02/02/18

## 2018-02-03 ENCOUNTER — Other Ambulatory Visit: Payer: Self-pay | Admitting: Family Medicine

## 2018-02-03 ENCOUNTER — Encounter: Payer: Self-pay | Admitting: Family Medicine

## 2018-02-03 DIAGNOSIS — R918 Other nonspecific abnormal finding of lung field: Secondary | ICD-10-CM

## 2018-02-09 ENCOUNTER — Ambulatory Visit (HOSPITAL_BASED_OUTPATIENT_CLINIC_OR_DEPARTMENT_OTHER)
Admission: RE | Admit: 2018-02-09 | Discharge: 2018-02-09 | Disposition: A | Discharge status: Home / Self Care | Payer: Medicare Other | Source: Ambulatory Visit | Attending: Family Medicine | Admitting: Family Medicine

## 2018-02-09 ENCOUNTER — Other Ambulatory Visit: Payer: Self-pay | Admitting: Family Medicine

## 2018-02-09 DIAGNOSIS — I700 Atherosclerosis of aorta: Secondary | ICD-10-CM | POA: Insufficient documentation

## 2018-02-09 DIAGNOSIS — I2510 Atherosclerotic heart disease of native coronary artery without angina pectoris: Secondary | ICD-10-CM | POA: Insufficient documentation

## 2018-02-09 DIAGNOSIS — Z87891 Personal history of nicotine dependence: Secondary | ICD-10-CM | POA: Insufficient documentation

## 2018-02-09 DIAGNOSIS — R918 Other nonspecific abnormal finding of lung field: Secondary | ICD-10-CM | POA: Diagnosis not present

## 2018-02-09 DIAGNOSIS — J432 Centrilobular emphysema: Secondary | ICD-10-CM | POA: Diagnosis not present

## 2018-02-10 ENCOUNTER — Encounter: Payer: Self-pay | Admitting: Family Medicine

## 2018-02-10 DIAGNOSIS — I700 Atherosclerosis of aorta: Secondary | ICD-10-CM | POA: Insufficient documentation

## 2018-02-11 ENCOUNTER — Other Ambulatory Visit: Payer: Self-pay | Admitting: Family Medicine

## 2018-02-13 DIAGNOSIS — H905 Unspecified sensorineural hearing loss: Secondary | ICD-10-CM | POA: Diagnosis not present

## 2018-02-13 DIAGNOSIS — H90A22 Sensorineural hearing loss, unilateral, left ear, with restricted hearing on the contralateral side: Secondary | ICD-10-CM | POA: Diagnosis not present

## 2018-02-13 DIAGNOSIS — H90A31 Mixed conductive and sensorineural hearing loss, unilateral, right ear with restricted hearing on the contralateral side: Secondary | ICD-10-CM | POA: Diagnosis not present

## 2018-02-19 DIAGNOSIS — R339 Retention of urine, unspecified: Secondary | ICD-10-CM | POA: Diagnosis not present

## 2018-02-27 ENCOUNTER — Ambulatory Visit: Payer: Medicare Other | Admitting: Pulmonary Disease

## 2018-02-27 ENCOUNTER — Encounter: Payer: Self-pay | Admitting: Pulmonary Disease

## 2018-02-27 DIAGNOSIS — R911 Solitary pulmonary nodule: Secondary | ICD-10-CM

## 2018-02-27 MED ORDER — FLUTICASONE FUROATE-VILANTEROL 200-25 MCG/INH IN AEPB: 1.0000 | INHALATION_SPRAY | Freq: Every day | RESPIRATORY_TRACT | 0 refills | Status: AC

## 2018-02-27 NOTE — Progress Notes (Signed)
Evan [name]    [id number]    [birth year]  Primary Care Physician:Wendling, Jilda Roche, DO  Referring Physician: Sharlene Dory, DO [address] [place], [place] [address]  Chief complaint:  Follow up for emphysema, chronic bronchitis Asthma  HPI: 77 year old heavy smoker with emphysema, chronic bronchitis.  He has complained of cough for the past 3-4 months with white mucus, dyspnea, wheezing.  Denies any fevers, chills.  He was seen by primary care and given doxycycline and prednisone. He is on albuterol inhaler and never been on controller medication.  He was evaluated in the pulmonary clinic in 2015 and PFTs ordered but did not get done   Hospitalized in April 2019 for septic shock, ureteral stone with hydronephrosis and coag negative bacteremia  Pets: 2 dogs.  No birds, farm animals Occupation: Retired Academic librarian, Corporate investment banker Exposures: Exposure to asbestos while working in [employer] representative Smoking history: 150-pack-year smoking history.  Quit in 2016 Travel History: Not significant  Interim history: States that breathing is doing well.  He could not afford trelegy and still on Breo although the cost for both inhalers at the same  Outpatient Encounter Medications as of 02/27/2018  Medication Sig  . albuterol (PROVENTIL HFA;VENTOLIN HFA) 108 (90 Base) MCG/ACT inhaler Inhale 2 puffs into the lungs every 6 (six) hours as needed for wheezing or shortness of breath.  . allopurinol (ZYLOPRIM) 100 MG tablet Take 1 tablet (100 mg total) by mouth daily.  Marland Kitchen atorvastatin (LIPITOR) 20 MG tablet TAKE 1 TABLET BY MOUTH DAILY  . carbidopa-levodopa (SINEMET IR) 25-100 MG tablet Take 1 tablet by mouth 3 (three) times daily.  . colchicine 0.6 MG tablet TAKE 1 TABLET BY MOUTH EVERY 2 HOURS UNTIL RELIEF as needed  . diazepam (VALIUM) 5 MG tablet Take 2.5-5 mg by mouth daily as needed (neck pain).   Marland Kitchen diclofenac sodium (VOLTAREN) 1 % GEL Apply 2 g  4 (four) times daily topically.  . fluticasone furoate-vilanterol (BREO ELLIPTA) 200-25 MCG/INH AEPB Inhale 1 puff into the lungs daily.  . furosemide (LASIX) 20 MG tablet Take 2 tablets (40 mg total) by mouth daily. (Patient taking differently: Take 40 mg by mouth every morning. )  . ipratropium-albuterol (DUONEB) 0.5-2.5 (3) MG/3ML SOLN Take 3 mLs by nebulization every 6 (six) hours as needed. (Patient taking differently: Take 3 mLs by nebulization every 6 (six) hours as needed (wheezing/shortness of breath). )  . magnesium hydroxide (MILK OF MAGNESIA) 800 MG/5ML suspension Take by mouth daily as needed for constipation.  Marland Kitchen morphine (MSIR) 15 MG tablet Take 1 tablet (15 mg total) by mouth every 6 (six) hours as needed for severe pain.  . Multiple Vitamin (MULTIVITAMIN WITH MINERALS) TABS tablet Take 1 tablet by mouth daily.  . niacin 500 MG tablet Take 500 mg by mouth daily.   Marland Kitchen nystatin (MYCOSTATIN) 100000 UNIT/ML suspension Take 5 mLs (500,000 Units total) by mouth 4 (four) times daily.  Marland Kitchen omeprazole (PRILOSEC) 20 MG capsule TAKE 1 CAPSULE BY MOUTH EACH MORNING  . ondansetron (ZOFRAN) 4 MG tablet Take 1 tablet (4 mg total) by mouth daily as needed for nausea or vomiting.  . phenazopyridine (PYRIDIUM) 200 MG tablet Take 1 tablet (200 mg total) by mouth 3 (three) times daily as needed for pain.  Bertram Gala Glycol-Propyl Glycol (SYSTANE OP) Place 2 drops into both eyes at bedtime as needed (For dry eyes.).   Marland Kitchen potassium chloride (KLOR-CON 10) 10 MEQ tablet  Take 2 tablets (20 mEq total) by mouth daily. (Patient taking differently: Take 20 mEq by mouth every morning. )  . Probiotic Product (PROBIOTIC PO) Take 1 capsule by mouth every morning.   . sertraline (ZOLOFT) 25 MG tablet TAKE 1 TABLET BY MOUTH DAILY  . tamsulosin (FLOMAX) 0.4 MG CAPS capsule Take 2 capsules (0.8 mg total) by mouth every morning.  . traMADol (ULTRAM) 50 MG tablet Take by mouth every 6 (six) hours as needed.  . [DISCONTINUED]  Fluticasone-Umeclidin-Vilant (TRELEGY ELLIPTA) 100-62.5-25 MCG/INH AEPB Inhale 1 puff into the lungs daily.   No facility-administered encounter medications on file as of 02/27/2018.    Physical Exam: Blood pressure 122/80, pulse (!) 58, height 5\' 6"  (1.676 m), weight 195 lb (88.5 kg), SpO2 92 %. Gen:      No acute distress HEENT:  EOMI, sclera anicteric Neck:     No masses; no thyromegaly Lungs:    Clear to auscultation bilaterally; normal respiratory effort CV:         Regular rate and rhythm; no murmurs Abd:      + bowel sounds; soft, non-tender; no palpable masses, no distension Ext:    No edema; adequate peripheral perfusion Skin:      Warm and dry; no rash Neuro: alert and oriented x 3 Psych: normal mood and affect  Data Reviewed: Low-dose screening CT of the chest 07/19/16- emphysema, left base scarring.  Scattered tiny pulmonary nodules. Low-dose screening CT of the chest 02/08/17-stable emphysema, left base scarring and stable pulmonary nodules. Low-dose screening CT of the chest 02/09/2018- emphysema, stable pulmonary nodules.  Irregularly-shaped nodule right upper lobe. I have reviewed the images personally.  PFTs 05/31/17 FVC 2.73 [77%], FEV1 2.08 [82%], F/F 76, TLC 84%, DLCO 54% Mild obstruction, moderate diffusion defect  FENO  07/12/17-88  09/15/2017-47  CBC 07/12/2017-WBC 9, eos 0 Blood allergy profile 07/12/2017-IgE 157, RAST panel is negative  Assessment:  Emphysema, chronic bronchitis Moderate persistent asthma.   Symptoms are stable.  He has not tolerated stopping of his controller medication in the past.   Continue Breo, albuterol as needed. Continue Mucinex and flutter valve.   PFTs show only minimal-mild obstruction in though he has significant smoking history.  He has reduced DLCO which corrects for alveolar volume.  There is no evidence of interstitial lung disease on recent CT of the chest except for minimal left base scarring that is stable.  Sub Cm  pulmonary nodules Follow-up CT in 6 months.  Health maintenance 01/16/18-influenza 12/18/2013- Pneumovax 01/21/2015-Prevnar 13  Plan/Recommendations: - Continue Breo, albuterol nebulizer and duo nebs as needed - Six-month follow-up CT of the chest.  Chilton Greathouse MD [facility] 02/27/2018, 1:30 PM  CC: Sharlene Dory*

## 2018-02-27 NOTE — Patient Instructions (Signed)
Glad your breathing is stable We will continue on Breo for now We will need a follow-up CT in 6 months for lung nodule follow-up  Follow-up in 6 months.

## 2018-03-09 ENCOUNTER — Other Ambulatory Visit: Payer: Self-pay | Admitting: Family Medicine

## 2018-03-10 ENCOUNTER — Ambulatory Visit (INDEPENDENT_AMBULATORY_CARE_PROVIDER_SITE_OTHER): Payer: Medicare Other | Admitting: Family Medicine

## 2018-03-10 ENCOUNTER — Encounter: Payer: Self-pay | Admitting: Family Medicine

## 2018-03-10 VITALS — BP 124/80 | HR 70 | Temp 98.0°F | Ht 66.0 in | Wt 195.0 lb

## 2018-03-10 DIAGNOSIS — R109 Unspecified abdominal pain: Secondary | ICD-10-CM | POA: Diagnosis not present

## 2018-03-10 MED ORDER — TRAMADOL HCL 50 MG PO TABS: 50.0000 mg | ORAL_TABLET | Freq: Two times a day (BID) | ORAL | 0 refills | Status: DC | PRN

## 2018-03-10 MED ORDER — TIZANIDINE HCL 4 MG PO TABS: 4.0000 mg | ORAL_TABLET | Freq: Three times a day (TID) | ORAL | 0 refills | Status: DC | PRN

## 2018-03-10 NOTE — Patient Instructions (Addendum)
Ice/cold pack over area for 10-15 min twice daily.  Heat (pad or rice pillow in microwave) over affected area, 10-15 minutes twice daily.   OK to take Tylenol 1000 mg (2 extra strength tabs) or 975 mg (3 regular strength tabs) every 6 hours as needed.  Do not drink alcohol, do any illicit/street drugs, drive or do anything that requires alertness while on this medicine (tramadol).  Stretch the area when able, try to do twice daily.   Let us know if you need anything.

## 2018-03-10 NOTE — Progress Notes (Signed)
Pre visit review using our clinic review tool, if applicable. No additional management support is needed unless otherwise documented below in the visit note. 

## 2018-03-10 NOTE — Progress Notes (Signed)
Musculoskeletal Exam  Patient: Evan [name] DOB: 1940-09-25  DOS: 03/10/2018  SUBJECTIVE:  Chief Complaint:   Chief Complaint  Patient presents with  . Back Pain    Evan [name] is a 77 y.o.  male for evaluation and treatment of R side/back pain.   Onset:  2 weeks ago.  No injr or change in activity.  Location: R side Character:  sharp  Progression of issue:  is unchanged Associated symptoms: hurts when he takes a deep breath Treatment: to date has been none.   Neurovascular symptoms: no  ROS: Musculoskeletal/Extremities: +R side pain  Past Medical History:  Diagnosis Date  . Abnormality of gait    multiple cervical spondylosis  . Anxiety   . Aortic atherosclerosis (HCC)   . Bilateral leg weakness    due to back surgeries--  mostly uses wheelchair and at home very short distant w/ walker  . Bilateral lower extremity edema   . BPH (benign prostatic hyperplasia)   . Cervical spondylosis without myelopathy    per dr cram (neuro surg.)  . Chronic back pain   . Chronic bronchitis (HCC)   . Chronic gout    08-02-2017  per pt gout stable ,  last bout beginning March 2019  . Depression   . Diastolic CHF (HCC)   . Diverticulosis of colon   . Emphysema/COPD [facility]) pulmologist-  dr Chilton Greathousepraveen mannam   last acute exacerbation 07-12-2017 (documented in epic)  . Full dentures   . GERD (gastroesophageal reflux disease)   . Hard of hearing    does not wear hearing aides  . Hemorrhoids   . History of sepsis    07-15-2017  urosepsis secondary to kidney stone extraction--  completed 14d IV vancomyocin   . Hyperlipidemia   . Nephrolithiasis    per CT 07-15-2017 multiple non-obstructive renal stones  . Numbness    hands --- related to neck  . OA (osteoarthritis)    hands  . Pressure ulcer of sacral region, stage 1    per pcp note in epic dated 07-29-2017  . RBBB (right bundle branch block)   . Right ureteral stone   . Self-catheterizes urinary bladder      Objective: VITAL SIGNS: BP 124/80 (BP Location: Left Arm, Patient Position: Sitting, Cuff Size: Normal)   Pulse 70   Temp 98 F (36.7 C) (Oral)   Ht 5\' 6"  (1.676 m)   Wt 195 lb (88.5 kg)   SpO2 96%   BMI 31.47 kg/m  Constitutional: Well formed, well developed. No acute distress. Cardiovascular: Brisk cap refill Thorax & Lungs: No accessory muscle use Musculoskeletal: R side.   Tenderness to palpation: Yes, over R9-10 Deformity: no Ecchymosis: no Neurologic: Normal sensory function. No focal deficits noted. DTR's equal and symmetry in UE's. No clonus. Psychiatric: Normal mood. Age appropriate judgment and insight. Alert & oriented x 3.    Assessment:  Side pain - Plan: tiZANidine (ZANAFLEX) 4 MG tablet, traMADol (ULTRAM) 50 MG tablet  Plan: Med AE's discussed. Use Ultram for breakthrough. Ice, heat, Tylenol, stretch. F/u prn. The patient and his wife voiced understanding and agreement to the plan.   Jilda Rocheicholas Paul DayvilleWendling, DO 03/10/18  12:03 PM

## 2018-03-21 DIAGNOSIS — R339 Retention of urine, unspecified: Secondary | ICD-10-CM | POA: Diagnosis not present

## 2018-03-23 DIAGNOSIS — N3021 Other chronic cystitis with hematuria: Secondary | ICD-10-CM | POA: Diagnosis not present

## 2018-03-23 DIAGNOSIS — R3914 Feeling of incomplete bladder emptying: Secondary | ICD-10-CM | POA: Diagnosis not present

## 2018-05-11 ENCOUNTER — Telehealth: Payer: Self-pay | Admitting: Family Medicine

## 2018-05-11 ENCOUNTER — Other Ambulatory Visit: Payer: Self-pay | Admitting: Family Medicine

## 2018-05-11 ENCOUNTER — Telehealth: Payer: Self-pay | Admitting: Pulmonary Disease

## 2018-05-11 MED ORDER — FLUTICASONE FUROATE-VILANTEROL 200-25 MCG/INH IN AEPB: 1.0000 | INHALATION_SPRAY | Freq: Every day | RESPIRATORY_TRACT | 5 refills | Status: DC

## 2018-05-11 NOTE — Telephone Encounter (Signed)
Copied from CRM [id number]. Topic: General - Other >> May 11, 2018 10:53 AM Marylen Ponto wrote: Reason for CRM: Pt called to see if the office has any samples of the fluticasone furoate-vilanterol (BREO ELLIPTA) 200-25 MCG/INH AEPB.   Called the patient to inform we have no samples. No answer/left detailed message to call back

## 2018-05-11 NOTE — Telephone Encounter (Signed)
Requested medication (s) are due for refill today: yes  Requested medication (s) are on the active medication list: yes    Last refill: 11/28/17    Future visit scheduled no  Notes to clinic:historical provider  Requested Prescriptions  Pending Prescriptions Disp Refills   fluticasone furoate-vilanterol (BREO ELLIPTA) 200-25 MCG/INH AEPB 1 each 3    Sig: Inhale 1 puff into the lungs daily.     Pulmonology:  Combination Products Passed - 05/11/2018 11:00 AM      Passed - Valid encounter within last 12 months    Recent Outpatient Visits          2 months ago Side pain   Holiday representative at [facility] [place], [place], [place]   3 months ago Epidermal cyst   Holiday representative at [facility], [place], [place]   3 months ago Epidermal cyst   Holiday representative at [facility], [place], [place]   3 months ago Hearing loss of right ear, unspecified hearing loss type   Holiday representative at [facility], [place], [place]   9 months ago Pre-op exam   Holiday representative at [facility], [place], [place]

## 2018-05-11 NOTE — Telephone Encounter (Signed)
Called the patient back left message to call back. Did leave message for them to call pulmonary for samples.

## 2018-05-11 NOTE — Telephone Encounter (Signed)
Received call from Patient for Breo refill.  Breo refill placed today by Dr. Carmelia Roller office.  Message left on VM that refill was sent earlier today.  Nothing further at this time.

## 2018-05-11 NOTE — Telephone Encounter (Signed)
Copied from CRM [id number]. Topic: Quick Communication - Rx Refill/Question >> May 11, 2018 10:52 AM Marylen Ponto wrote: Medication: fluticasone furoate-vilanterol (BREO ELLIPTA) 200-25 MCG/INH AEPB  Has the patient contacted their pharmacy? yes   Preferred Pharmacy (with phone number or street name): [facility] - [place] - [address] [address] [phone] (Phone) [phone] (Fax)  Agent: Please be advised that RX refills may take up to 3 business days. We ask that you follow-up with your pharmacy.

## 2018-05-28 IMAGING — XA DG FLUORO GUIDE LUMBAR PUNCTURE
1 series · 1 of 1 positions shown · non-contrast
Comparison: none

CLINICAL DATA: Balance disturbance. Possible normal pressure
hydrocephalus. High volume lumbar puncture requested.

[Series 2: ortho adipose · 1 of 1 slices shown]
[im 1/1]
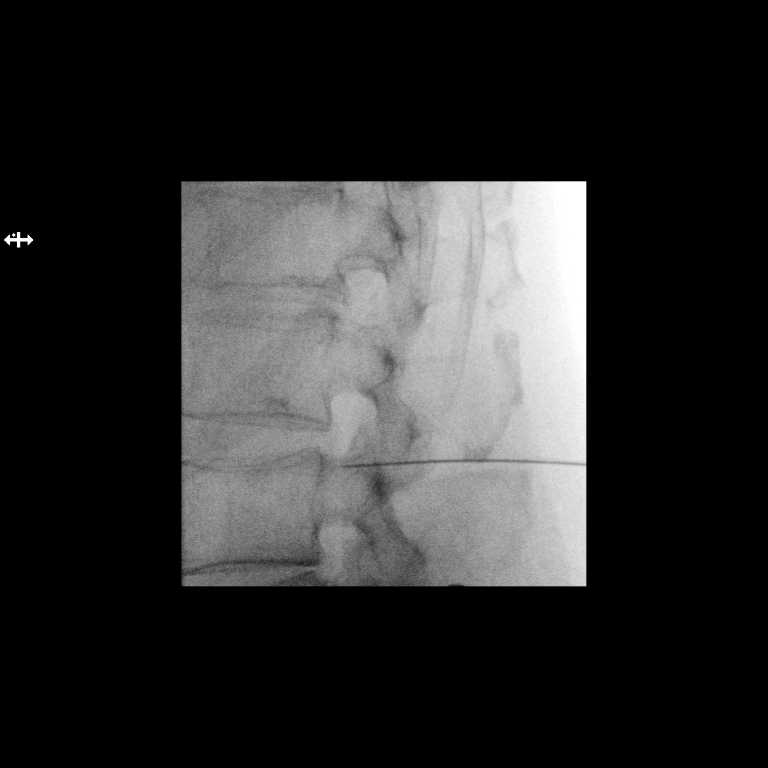

[1 of 1 positions shown; findings below may reference images not displayed]

EXAM:
DIAGNOSTIC LUMBAR PUNCTURE UNDER FLUOROSCOPIC GUIDANCE

FLUOROSCOPY TIME:  Radiation Exposure Index (as provided by the
fluoroscopic device): 2 minutes 38 seconds. 985.52 micro gray meter
squared

PROCEDURE:
Informed consent was obtained from the patient prior to the
procedure, including potential complications of headache, allergy,
and pain. With the patient left lateral decubitus because he was
unable to lie prone, the lower back was prepped with Betadine. 1%
Lidocaine was used for local anesthesia. Lumbar puncture was
performed at the L2-3 from a right para median approach level using
a 6 inch 20 gauge needle with return of clear CSF with an opening
pressure of 13 cm water. Thirty ml of CSF were obtained for
laboratory studies and high volume fluid removal. Closing pressure
was 5 cm water. The patient tolerated the procedure well and there
were no apparent complications.
IMPRESSION: Large volume lumbar puncture, 30 cc. Opening pressure 13 cm water.
Closing pressure 5 cm water.

## 2018-06-04 ENCOUNTER — Encounter (HOSPITAL_COMMUNITY): Payer: Self-pay | Admitting: Emergency Medicine

## 2018-06-04 ENCOUNTER — Emergency Department (HOSPITAL_COMMUNITY): Payer: Medicare Other

## 2018-06-04 ENCOUNTER — Emergency Department (HOSPITAL_COMMUNITY)
Admission: EM | Admit: 2018-06-04 | Discharge: 2018-06-04 | Disposition: A | Discharge status: Home / Self Care | Payer: Medicare Other | Attending: Emergency Medicine | Admitting: Emergency Medicine

## 2018-06-04 ENCOUNTER — Other Ambulatory Visit: Payer: Self-pay

## 2018-06-04 DIAGNOSIS — R531 Weakness: Secondary | ICD-10-CM | POA: Diagnosis not present

## 2018-06-04 DIAGNOSIS — Z87891 Personal history of nicotine dependence: Secondary | ICD-10-CM | POA: Diagnosis not present

## 2018-06-04 DIAGNOSIS — R296 Repeated falls: Secondary | ICD-10-CM | POA: Diagnosis not present

## 2018-06-04 DIAGNOSIS — Z79899 Other long term (current) drug therapy: Secondary | ICD-10-CM | POA: Insufficient documentation

## 2018-06-04 DIAGNOSIS — R600 Localized edema: Secondary | ICD-10-CM | POA: Diagnosis not present

## 2018-06-04 DIAGNOSIS — J439 Emphysema, unspecified: Secondary | ICD-10-CM | POA: Diagnosis not present

## 2018-06-04 DIAGNOSIS — G8929 Other chronic pain: Secondary | ICD-10-CM | POA: Diagnosis not present

## 2018-06-04 DIAGNOSIS — W19XXXA Unspecified fall, initial encounter: Secondary | ICD-10-CM | POA: Diagnosis not present

## 2018-06-04 DIAGNOSIS — I5032 Chronic diastolic (congestive) heart failure: Secondary | ICD-10-CM | POA: Diagnosis not present

## 2018-06-04 DIAGNOSIS — M549 Dorsalgia, unspecified: Secondary | ICD-10-CM | POA: Diagnosis not present

## 2018-06-04 DIAGNOSIS — R1084 Generalized abdominal pain: Secondary | ICD-10-CM | POA: Diagnosis not present

## 2018-06-04 DIAGNOSIS — Z743 Need for continuous supervision: Secondary | ICD-10-CM | POA: Diagnosis not present

## 2018-06-04 DIAGNOSIS — R279 Unspecified lack of coordination: Secondary | ICD-10-CM | POA: Diagnosis not present

## 2018-06-04 LAB — URINALYSIS, ROUTINE W REFLEX MICROSCOPIC
BACTERIA UA: NONE SEEN
Bilirubin Urine: NEGATIVE
Glucose, UA: NEGATIVE mg/dL
Hgb urine dipstick: NEGATIVE
KETONES UR: NEGATIVE mg/dL
Nitrite: NEGATIVE
Protein, ur: NEGATIVE mg/dL
Specific Gravity, Urine: 1.017 (ref 1.005–1.030)
pH: 5 (ref 5.0–8.0)

## 2018-06-04 LAB — CBC WITH DIFFERENTIAL/PLATELET
Abs Immature Granulocytes: 0.09 10*3/uL — ABNORMAL HIGH (ref 0.00–0.07)
Basophils Absolute: 0 10*3/uL (ref 0.0–0.1)
Basophils Relative: 0 %
EOS PCT: 1 %
Eosinophils Absolute: 0.1 10*3/uL (ref 0.0–0.5)
HEMATOCRIT: 37 % — AB (ref 39.0–52.0)
HEMOGLOBIN: 11.9 g/dL — AB (ref 13.0–17.0)
Immature Granulocytes: 1 %
LYMPHS PCT: 10 %
Lymphs Abs: 1.3 10*3/uL (ref 0.7–4.0)
MCH: 31.1 pg (ref 26.0–34.0)
MCHC: 32.2 g/dL (ref 30.0–36.0)
MCV: 96.6 fL (ref 80.0–100.0)
Monocytes Absolute: 1 10*3/uL (ref 0.1–1.0)
Monocytes Relative: 8 %
Neutro Abs: 9.7 10*3/uL — ABNORMAL HIGH (ref 1.7–7.7)
Neutrophils Relative %: 80 %
Platelets: 221 10*3/uL (ref 150–400)
RBC: 3.83 MIL/uL — ABNORMAL LOW (ref 4.22–5.81)
RDW: 13.3 % (ref 11.5–15.5)
WBC: 12.1 10*3/uL — ABNORMAL HIGH (ref 4.0–10.5)
nRBC: 0 % (ref 0.0–0.2)

## 2018-06-04 LAB — COMPREHENSIVE METABOLIC PANEL
ALT: 18 U/L (ref 0–44)
AST: 24 U/L (ref 15–41)
Albumin: 3.6 g/dL (ref 3.5–5.0)
Alkaline Phosphatase: 100 U/L (ref 38–126)
Anion gap: 10 (ref 5–15)
BUN: 23 mg/dL (ref 8–23)
CO2: 23 mmol/L (ref 22–32)
CREATININE: 0.79 mg/dL (ref 0.61–1.24)
Calcium: 8.8 mg/dL — ABNORMAL LOW (ref 8.9–10.3)
Chloride: 106 mmol/L (ref 98–111)
GFR calc non Af Amer: >60 mL/min (ref >60)
Glucose, Bld: 122 mg/dL — ABNORMAL HIGH (ref 70–99)
Potassium: 3.8 mmol/L (ref 3.5–5.1)
Sodium: 139 mmol/L (ref 135–145)
Total Bilirubin: 1.3 mg/dL — ABNORMAL HIGH (ref 0.3–1.2)
Total Protein: 6.9 g/dL (ref 6.5–8.1)

## 2018-06-04 LAB — TROPONIN I: Troponin I: <0.03 ng/mL (ref <0.03)

## 2018-06-04 LAB — INFLUENZA PANEL BY PCR (TYPE A & B)
Influenza A By PCR: NEGATIVE
Influenza B By PCR: NEGATIVE

## 2018-06-04 LAB — LACTIC ACID, PLASMA: Lactic Acid, Venous: 0.8 mmol/L (ref 0.5–1.9)

## 2018-06-04 LAB — LIPASE, BLOOD: Lipase: 23 U/L (ref 11–51)

## 2018-06-04 MED ORDER — SODIUM CHLORIDE 0.9 % IV BOLUS
500.0000 mL | Freq: Once | INTRAVENOUS | Status: AC
  Administered 2018-06-04: 500 mL via INTRAVENOUS

## 2018-06-04 MED ORDER — ACETAMINOPHEN 325 MG PO TABS
650.0000 mg | ORAL_TABLET | Freq: Once | ORAL | Status: AC
  Administered 2018-06-04: 650 mg via ORAL
  Filled 2018-06-04: qty 2

## 2018-06-04 NOTE — Discharge Instructions (Addendum)
You were seen in the emergency department for some generalized weakness and more difficulty with transfers.  You had lab work chest x-ray and urinalysis that did not show an obvious cause of your symptoms.  There is still urine culture blood cultures and a flu test pending.  You should follow-up with your doctor.  Please return if you have any worsening symptoms.

## 2018-06-04 NOTE — ED Triage Notes (Signed)
Pt presents by [facility] for evaluation of UTI symptoms that have been ongoing for the last year and is scheduled to see PCP tomorrow.

## 2018-06-04 NOTE — ED Provider Notes (Addendum)
[facility]-EMERGENCY DEPT Provider Note   CSN: [id number] Arrival date & time: 06/04/18  1953     History   Chief Complaint Chief Complaint  Patient presents with  . Recurrent UTI    HPI Evan [name] is a 78 y.o. male.  He is brought in by ambulance from home for evaluation of weakness.  He had multiple back surgeries which is left him with minimal use of his right leg and he usually can transfer from bed to chair or bed to wheelchair independently.  For the last 2 days his chronically weak right leg has not been able to support him during transfers and is had a couple falls today.  No injury.  His wife said this is a similar to last year when he had a UTI and ended up being septic and being in the ICU.  He has not had any urinary symptoms and also denies any fevers chills cough abdominal pain nausea vomiting diarrhea.  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  The history is provided by the patient and the spouse.  Weakness  Severity:  Moderate Onset quality:  Gradual Duration:  2 days Timing:  Constant Progression:  Worsening Chronicity:  Recurrent Relieved by:  Nothing Worsened by:  Nothing Ineffective treatments:  None tried Associated symptoms: difficulty walking, falls and lethargy   Associated symptoms: no abdominal pain, no aphasia, no chest pain, no cough, no diarrhea, no dysphagia, no dysuria, no fever, no foul-smelling urine, no frequency, no headaches, no loss of consciousness, no nausea, no sensory-motor deficit, no shortness of breath, no syncope, no vision change and no vomiting   Risk factors: neurologic disease     Past Medical History:  Diagnosis Date  . Abnormality of gait    multiple cervical spondylosis  . Anxiety   . Aortic atherosclerosis (HCC)   . Bilateral leg weakness    due to back surgeries--  mostly uses wheelchair and at home very short distant w/ walker  . Bilateral  lower extremity edema   . BPH (benign prostatic hyperplasia)   . Cervical spondylosis without myelopathy    per dr cram (neuro surg.)  . Chronic back pain   . Chronic bronchitis (HCC)   . Chronic gout    08-02-2017  per pt gout stable ,  last bout beginning March 2019  . Depression   . Diastolic CHF (HCC)   . Diverticulosis of colon   . Emphysema/COPD [facility]) pulmologist-  dr Chilton Greathouse   last acute exacerbation 07-12-2017 (documented in epic)  . Full dentures   . GERD (gastroesophageal reflux disease)   . Hard of hearing    does not wear hearing aides  . Hemorrhoids   . History of sepsis    07-15-2017  urosepsis secondary to kidney stone extraction--  completed 14d IV vancomyocin   . Hyperlipidemia   . Nephrolithiasis    per CT 07-15-2017 multiple non-obstructive renal stones  . Numbness    hands --- related to neck  . OA (osteoarthritis)    hands  . Pressure ulcer of sacral region, stage 1    per pcp note in epic dated 07-29-2017  . RBBB (right bundle branch block)   . Right ureteral stone   . Self-catheterizes urinary bladder     Patient Active Problem List   Diagnosis Date Noted  . Aortic atherosclerosis (HCC)   . Epidermal cyst 01/16/2018  . Hearing loss of right ear 01/16/2018  .  Pressure injury of right buttock, stage 2 (HCC) 08/08/2017  . Pressure ulcer of sacral region, stage 1 07/29/2017  . Sepsis (HCC) 07/15/2017  . Ureteropelvic junction (UPJ) obstruction   . Mixed simple and mucopurulent chronic bronchitis (HCC)   . Chronic bronchitis with emphysema (HCC) 05/31/2017  . Bilateral hearing loss 04/25/2017  . Parkinsonism (HCC) 09/03/2016  . Right leg weakness 09/03/2016  . Diverticulosis of colon with hemorrhage   . UTI (urinary tract infection) 12/09/2015  . Rectal bleed 12/07/2015  . Chronic diastolic CHF (congestive heart failure) (HCC) 12/07/2015  . Rectal bleeding 12/07/2015  . Abnormality of gait 10/29/2015  . Spondylosis, cervical, with  myelopathy 10/29/2015  . Weakness of both legs 07/27/2015  . Shortness of breath on exertion 07/24/2015  . Tremor 07/24/2015  . Acute stress reaction 07/24/2015  . Screening, ischemic heart disease 01/29/2015  . Hernia of abdominal cavity 12/20/2014  . Gastroesophageal reflux disease without esophagitis 09/03/2014  . Depression with anxiety 09/03/2014  . BPH (benign prostatic hyperplasia) 09/03/2014  . Gout, chronic 09/03/2014  . Spondylolisthesis at L4-L5 level 07/22/2014  . Diastolic dysfunction with heart failure (HCC) 01/01/2014  . Peripheral neuropathy 01/01/2014  . DOE (dyspnea on exertion) 11/15/2013  . Leg edema 11/15/2013  . Snoring 11/15/2013  . Pulmonary emphysema (HCC) 05/10/2013    Past Surgical History:  Procedure Laterality Date  . ABDOMINAL HERNIA REPAIR  1970's  . ANTERIOR CERVICAL DECOMP/DISCECTOMY FUSION  07/03/2001   C4 -- C5/  post op Exploration and evacuation cervical hematoma  . BLEPHAROPLASTY  2008  . CARDIOVASCULAR STRESS TEST  05/27/1998   normal nuclear study w/ no ischemia/  normal LV function and wall motion , ef 64%  . CARPAL TUNNEL RELEASE Left 2003 approx.  . CERVICAL FUSION  1995   C5 -- C6  . COLONOSCOPY WITH PROPOFOL N/A 02/10/2016   Procedure: COLONOSCOPY WITH PROPOFOL;  Surgeon: Hilarie Fredrickson, MD;  Location: [facility];  Service: Endoscopy;  Laterality: N/A;  . CYSTO/  LEFT RETROGRADE PYELOGRAM/  BALLOON DILATION LEFT URETER/  URETEROSCOPY/  STENT PLACEMENT  07/28/2000  . CYSTOSCOPY W/ URETERAL STENT PLACEMENT Right 07/15/2017   Procedure: CYSTOSCOPY WITH RETROGRADE PYELOGRAM/URETERAL STENT PLACEMENT;  Surgeon: Ihor Gully, MD;  Location: [facility];  Service: Urology;  Laterality: Right;  . CYSTOSCOPY/URETEROSCOPY/HOLMIUM LASER/STENT PLACEMENT Right 08/10/2017   Procedure: CYSTOSCOPY/URETEROSCOPY/HOLMIUM LASER/STENT PLACEMENT;  Surgeon: Rene Paci, MD;  Location: [facility];  Service: Urology;  Laterality: Right;   ONLY NEEDS 45 MIN FOR PROCEDURE  . ESOPHAGOGASTRODUODENOSCOPY    . EXPLORATION AND RE-DO CERVICAL FUSION C4--5 AND ANTERIOR CERVIAL DISKECTOMY FUSION C3--4  08/09/2007   POST-OP EXPLORATION AND EVACUATION RETROPHARYNGEAL HEMATOMA  . LAPAROSCOPIC CHOLECYSTECTOMY  1990'S  . LUMBAR LAMINECTOMY/DECOMPRESSION MICRODISCECTOMY  10/29/2011   Procedure: LUMBAR LAMINECTOMY/DECOMPRESSION MICRODISCECTOMY 1 LEVEL;  Surgeon: Mariam Dollar, MD;  Location: [facility];  Service: Neurosurgery;  Laterality: Right;  Right Lumbar Three-Four Lumbar Laminectomy/Microdiscectomy  . MAXIMUM ACCESS (MAS)POSTERIOR LUMBAR INTERBODY FUSION (PLIF) 1 LEVEL N/A 07/22/2014   Procedure: FOR MAXIMUM ACCESS (MAS) POSTERIOR LUMBAR INTERBODY FUSION (PLIF) 1 LEVEL;  Surgeon: Donalee Citrin, MD;  Location: [facility];  Service: Neurosurgery;  Laterality: N/A;  FOR MAXIMUM ACCESS (MAS) POSTERIOR LUMBAR INTERBODY FUSION (PLIF) 1 LEVEL L4-5  . NEUROPLASTY / TRANSPOSITION ULNAR NERVE AT ELBOW Left 07/23/2008  . POSTERIOR CERVICAL FUSION/FORAMINOTOMY  06/09/2011   Procedure: POSTERIOR CERVICAL FUSION/FORAMINOTOMY LEVEL 4;  Surgeon: Mariam Dollar, MD;  Location: [facility];  Service: Neurosurgery;  Laterality: N/A;  Cervical three to seven  posterior cervical fusion, Cervical four to seven Laminectomy, bone graft from right iliac crest   . POSTERIOR LUMBAR LAMINECTOMY AND DISKECTOMY  10/15/2009   L3 -- L4  . STENT REMOVAL  08/10/2017   Procedure: STENT REMOVAL;  Surgeon: Rene Paci, MD;  Location: [facility];  Service: Urology;;  . TONSILLECTOMY AND ADENOIDECTOMY  child  . TRANSTHORACIC ECHOCARDIOGRAM  11/27/2013   grade 1 diastolic dysfunction, ef 50-55%/  trivial AR and PR/ mild MR and TR/  PASP  . TRANSURETHRAL RESECTION OF PROSTATE N/A 05/31/2016   Procedure: TRANSURETHRAL RESECTION OF THE PROSTATE (TURP);  Surgeon: Jethro Bolus, MD;  Location: [facility];  Service: Urology;  Laterality:  N/A;        Home Medications    Prior to Admission medications   Medication Sig Start Date End Date Taking? Authorizing Provider  albuterol (PROVENTIL HFA;VENTOLIN HFA) 108 (90 Base) MCG/ACT inhaler Inhale 2 puffs into the lungs every 6 (six) hours as needed for wheezing or shortness of breath. 04/28/17   Copland, Gwenlyn Found, MD  allopurinol (ZYLOPRIM) 100 MG tablet Take 1 tablet (100 mg total) by mouth daily. 12/16/17   Sharlene Dory, DO  atorvastatin (LIPITOR) 20 MG tablet TAKE 1 TABLET BY MOUTH DAILY 02/13/18   Sharlene Dory, DO  carbidopa-levodopa (SINEMET IR) 25-100 MG tablet Take 1 tablet by mouth 3 (three) times daily. Patient not taking: Reported on 03/10/2018 09/03/16   Nita Sickle K, DO  colchicine 0.6 MG tablet TAKE 1 TABLET BY MOUTH EVERY 2 HOURS UNTIL RELIEF as needed 07/14/16   Sharlene Dory, DO  diclofenac sodium (VOLTAREN) 1 % GEL Apply 2 g 4 (four) times daily topically. 03/07/17   Sharlene Dory, DO  fluticasone furoate-vilanterol (BREO ELLIPTA) 200-25 MCG/INH AEPB Inhale 1 puff into the lungs daily. 05/11/18   Sharlene Dory, DO  ipratropium-albuterol (DUONEB) 0.5-2.5 (3) MG/3ML SOLN Take 3 mLs by nebulization every 6 (six) hours as needed. Patient taking differently: Take 3 mLs by nebulization every 6 (six) hours as needed (wheezing/shortness of breath).  04/28/17   Copland, Gwenlyn Found, MD  magnesium hydroxide (MILK OF MAGNESIA) 800 MG/5ML suspension Take by mouth daily as needed for constipation.    [provider]  Multiple Vitamin (MULTIVITAMIN WITH MINERALS) TABS tablet Take 1 tablet by mouth daily.    [provider]  niacin 500 MG tablet Take 500 mg by mouth daily.     [provider]  nystatin (MYCOSTATIN) 100000 UNIT/ML suspension Take 5 mLs (500,000 Units total) by mouth 4 (four) times daily. Patient not taking: Reported on 03/10/2018 08/10/17   Sharlene Dory, DO  omeprazole (PRILOSEC)  20 MG capsule TAKE 1 CAPSULE BY MOUTH EACH MORNING 02/13/18   Wendling, Jilda Roche, DO  phenazopyridine (PYRIDIUM) 200 MG tablet Take 1 tablet (200 mg total) by mouth 3 (three) times daily as needed for pain. 08/10/17 08/10/18  Rene Paci, MD  Polyethyl Glycol-Propyl Glycol (SYSTANE OP) Place 2 drops into both eyes at bedtime as needed (For dry eyes.).     [provider]  potassium chloride (KLOR-CON 10) 10 MEQ tablet Take 2 tablets (20 mEq total) by mouth daily. Patient taking differently: Take 20 mEq by mouth every morning.  07/29/17   Sharlene Dory, DO  Probiotic Product (PROBIOTIC PO) Take 1 capsule by mouth every morning.     [provider]  sertraline (ZOLOFT) 25  MG tablet TAKE 1 TABLET BY MOUTH DAILY 02/13/18   Sharlene Dory, DO  tamsulosin (FLOMAX) 0.4 MG CAPS capsule TAKE 2 CAPSULES BY MOUTH EVERY MORNING 03/09/18   Wendling, Jilda Roche, DO  tiZANidine (ZANAFLEX) 4 MG tablet Take 1 tablet (4 mg total) by mouth every 8 (eight) hours as needed for muscle spasms. 03/10/18   Sharlene Dory, DO  traMADol (ULTRAM) 50 MG tablet Take 1 tablet (50 mg total) by mouth every 12 (twelve) hours as needed for moderate pain. 03/10/18   Sharlene Dory, DO    Family History Family History  Problem Relation Age of Onset  . Diabetes Mother   . Parkinson's disease Father   . Heart disease Father     Social History Social History   Tobacco Use  . Smoking status: Former Smoker    Packs/day: 1.50    Years: 55.00    Pack years: 82.50    Types: Cigarettes    Last attempt to quit: 05/27/2013    Years since quitting: 5.0  . Smokeless tobacco: Former Neurosurgeon    Types: Chew    Quit date: 02/01/2017  Substance Use Topics  . Alcohol use: No    Alcohol/week: 0.0 standard drinks  . Drug use: No     Allergies   Hydromorphone and Oxycodone   Review of Systems Review of Systems  Constitutional: Negative for fever.  HENT:  Negative for sore throat.   Eyes: Negative for visual disturbance.  Respiratory: Negative for cough and shortness of breath.   Cardiovascular: Negative for chest pain and syncope.  Gastrointestinal: Negative for abdominal pain, diarrhea, dysphagia, nausea and vomiting.  Genitourinary: Negative for dysuria and frequency.  Musculoskeletal: Positive for back pain (chronic) and falls. Negative for neck pain.  Skin: Negative for rash.  Neurological: Positive for weakness (generalized). Negative for loss of consciousness and headaches.     Physical Exam Updated Vital Signs BP (!) 143/81 (BP Location: Left Arm)   Pulse 86   Temp 99 F (37.2 C) (Oral)   Resp 17   Ht 5\' 6"  (1.676 m)   Wt 88.5 kg   SpO2 93%   BMI 31.47 kg/m   Physical Exam Vitals signs and nursing note reviewed.  Constitutional:      Appearance: Normal appearance. He is well-developed. He is not toxic-appearing.  HENT:     Head: Normocephalic and atraumatic.  Eyes:     Conjunctiva/sclera: Conjunctivae normal.  Neck:     Musculoskeletal: Neck supple.  Cardiovascular:     Rate and Rhythm: Normal rate and regular rhythm.     Heart sounds: No murmur.  Pulmonary:     Effort: Pulmonary effort is normal. No respiratory distress.     Breath sounds: Normal breath sounds.  Abdominal:     Palpations: Abdomen is soft.     Tenderness: There is no abdominal tenderness. There is no guarding.     Hernia: A hernia (ventral nontender) is present.  Musculoskeletal:        General: No tenderness.     Right lower leg: Edema present.     Left lower leg: Edema present.  Skin:    General: Skin is warm and dry.     Capillary Refill: Capillary refill takes less than 2 seconds.  Neurological:     Mental Status: He is alert. Mental status is at baseline.     Sensory: No sensory deficit.     Motor: Weakness present.     Comments: Patient  has normal use of his upper extremities.  His left lower extremity would be 4+ out of 5.  Right  lower extremity 3+ out of 5.  Baseline nonambulatory.  Did not test gait.      ED Treatments / Results  Labs (all labs ordered are listed, but only abnormal results are displayed) Labs Reviewed  URINALYSIS, ROUTINE W REFLEX MICROSCOPIC - Abnormal; Notable for the following components:      Result Value   Leukocytes,Ua SMALL (*)    All other components within normal limits  COMPREHENSIVE METABOLIC PANEL - Abnormal; Notable for the following components:   Glucose, Bld 122 (*)    Calcium 8.8 (*)    Total Bilirubin 1.3 (*)    All other components within normal limits  CBC WITH DIFFERENTIAL/PLATELET - Abnormal; Notable for the following components:   WBC 12.1 (*)    RBC 3.83 (*)    Hemoglobin 11.9 (*)    HCT 37.0 (*)    Neutro Abs 9.7 (*)    Abs Immature Granulocytes 0.09 (*)    All other components within normal limits  URINE CULTURE  CULTURE, BLOOD (ROUTINE X 2)  CULTURE, BLOOD (ROUTINE X 2)  TROPONIN I  LIPASE, BLOOD  LACTIC ACID, PLASMA  INFLUENZA PANEL BY PCR (TYPE A & B)    EKG EKG Interpretation  Date/Time:  Sunday June 04 2018 21:27:08 EST Ventricular Rate:  89 PR Interval:    QRS Duration: 146 QT Interval:  379 QTC Calculation: 462 R Axis:   22 Text Interpretation:  Sinus rhythm Probable left atrial enlargement IVCD, consider atypical RBBB similar to prior 4/19 Confirmed by Meridee ScoreButler, Michael [phone]([id number]) on 06/04/2018 9:57:39 PM   Radiology Dg Chest Port 1 View  Result Date: 06/04/2018 CLINICAL DATA:  78 year old male with weakness and URI symptoms. EXAM: PORTABLE CHEST 1 VIEW COMPARISON:  Chest CT 02/09/2018 and earlier. FINDINGS: Portable AP semi upright view at 2049 hours. Upper lobe emphysema on the comparison CT. Superimposed increased pulmonary interstitial opacity with basilar predominance in both lungs. No pneumothorax. No pleural effusion or consolidation identified. Stable cardiac size and mediastinal contours. Calcified aortic atherosclerosis.  Visualized tracheal air column is within normal limits. Anterior and posterior cervical spine fusion hardware. IMPRESSION: Emphysema ([id number](ICD10-J43.9) with superimposed acute basilar predominant pulmonary interstitial opacity. Favor acute viral/atypical respiratory infection. Electronically Signed   By: Odessa FlemingH  Hall M.D.   On: 06/04/2018 21:49    Procedures Procedures (including critical care time)  Medications Ordered in ED Medications  acetaminophen (TYLENOL) tablet 650 mg (650 mg Oral Given 06/04/18 2252)  sodium chloride 0.9 % bolus 500 mL (0 mLs Intravenous Stopped 06/04/18 2325)     Initial Impression / Assessment and Plan / ED Course  I have reviewed the triage vital signs and the nursing notes.  Pertinent labs & imaging results that were available during my care of the patient were reviewed by me and considered in my medical decision making (see chart for details).  Clinical Course as of Jun 05 2356  Sun Jun 04, 2018  [id number] 78 year old male with chronic weakness in his right lower extremity due to prior back surgeries here with generalized weakness and increased difficulty with transfers with multiple falls.  He is a history of urinary retention and self caths.  Wife is worried that he may have a UTI because she seen that before were when he has an infection he gets weak like this.  No reported fevers.  His work-up so far shows an elevated  white count of 12 but a fairly unremarkable urine of 11-20 whites no bacteria and nitrite negative.   [MB]  2256 I updated the patient and his wife on the results of his testing. they said if there is no obvious infection they would rather just go home.  He is asking to be transferred back by ambulance as he would not be able to do the transfer out of the car.  I explained that the cultures and the flu test should be back within the next day or 2 and there may be some more information coming.  They also understand to return if any worsening symptoms.   [MB]      Clinical Course User Index [MB] Terrilee FilesButler, Michael C, MD     Final Clinical Impressions(s) / ED Diagnoses   Final diagnoses:  Weakness    ED Discharge Orders    None       Terrilee FilesButler, Michael C, MD 06/04/18 2306    Terrilee FilesButler, Michael C, MD 06/05/18 [phone]

## 2018-06-04 NOTE — ED Notes (Signed)
Dr. Charm Barges at bedside at this time to discuss plan of care.

## 2018-06-04 NOTE — ED Notes (Signed)
Bed: WA04 Expected date:  Expected time:  Means of arrival:  Comments: EMS 78 yo male from home possible UTI/not ambulatory

## 2018-06-04 NOTE — ED Notes (Signed)
[facility] notified of need for transport of pt back to residence.  

## 2018-06-07 LAB — URINE CULTURE: Special Requests: NORMAL

## 2018-06-08 ENCOUNTER — Telehealth: Payer: Self-pay | Admitting: *Deleted

## 2018-06-08 NOTE — Telephone Encounter (Signed)
Post ED Visit - Positive Culture Follow-up: Successful Patient Follow-Up  Culture assessed and recommendations reviewed by:  []  Enzo Bi, Pharm.D. []  Celedonio Miyamoto, Pharm.D., BCPS AQ-ID []  Garvin Fila, Pharm.D., BCPS []  Georgina Pillion, [address].D., BCPS []  [place], [address].D., BCPS, AAHIVP []  Estella Husk, Pharm.D., BCPS, AAHIVP []  Lysle Pearl, PharmD, BCPS []  Phillips Climes, PharmD, BCPS [x]  Agapito Games, PharmD, BCPS []  Verlan Friends, PharmD  Positive urine culture  [x]  Patient discharged without antimicrobial prescription and treatment is now indicated []  Organism is resistant to prescribed ED discharge antimicrobial []  Patient with positive blood cultures  Changes discussed with ED provider Eyvonne Mechanic, PA New antibiotic prescription Nirtofurantoin 100mg  PO BID x 5 days Called to [employer], [phone]  Contacted patient spouse, date 06/08/2018, time 1200   Lysle Pearl 06/08/2018, 12:07 PM

## 2018-06-08 NOTE — Telephone Encounter (Deleted)
Post ED Visit - Positive Culture Follow-up: Unsuccessful Patient Follow-up  Culture assessed and recommendations reviewed by:  []  Enzo Bi, Pharm.D. []  Celedonio Miyamoto, Pharm.D., BCPS AQ-ID []  Garvin Fila, Pharm.D., BCPS []  Georgina Pillion, Pharm.D., BCPS []  [place], [address].D., BCPS, AAHIVP []  Estella Husk, Pharm.D., BCPS, AAHIVP []  Sherlynn Carbon, PharmD []  Pollyann Samples, PharmD, BCPS  Positive *** culture  []  Patient discharged without antimicrobial prescription and treatment is now indicated []  Organism is resistant to prescribed ED discharge antimicrobial []  Patient with positive blood cultures   Unable to contact patient after 3 attempts, letter will be sent to address on file  Lysle Pearl 06/08/2018, 9:01 AM Post ED Visit - Positive Culture Follow-up: Successful Patient Follow-Up  Culture assessed and recommendations reviewed by:  []  Enzo Bi, Pharm.D. []  Celedonio Miyamoto, Pharm.D., BCPS AQ-ID []  Garvin Fila, Pharm.D., BCPS []  Georgina Pillion, Pharm.D., BCPS []  [place], [place].D., BCPS, AAHIVP []  Estella Husk, Pharm.D., BCPS, AAHIVP []  Lysle Pearl, PharmD, BCPS []  Phillips Climes, PharmD, BCPS []  Agapito Games, PharmD, BCPS []  Verlan Friends, PharmD  Positive *** culture  []  Patient discharged without antimicrobial prescription and treatment is now indicated []  Organism is resistant to prescribed ED discharge antimicrobial []  Patient with positive blood cultures  Changes discussed with ED provider: *** New antibiotic prescription *** Called to ***  Contacted patient, date ***, time ***   Lysle Pearl 06/08/2018, 9:01 AM   Post ED Visit - Positive Culture Follow-up: Unsuccessful Patient Follow-up  Culture assessed and recommendations reviewed by:  []  Enzo Bi, Pharm.D. []  Celedonio Miyamoto, Pharm.D., BCPS AQ-ID []  Garvin Fila, Pharm.D., BCPS []  Georgina Pillion, [address].D., BCPS []  [place], [address].D.,  BCPS, AAHIVP []  Estella Husk, Pharm.D., BCPS, AAHIVP []  Sherlynn Carbon, PharmD []  Pollyann Samples, PharmD, BCPS [place], Pharm D  Positive urine culture  []  Patient discharged without antimicrobial prescription and treatment is now indicated []  Organism is resistant to prescribed ED discharge antimicrobial []  Patient with positive blood cultures  Plan:  Symptom check, if symptomatic start Nitrofurantoin 100mg  PO BID x 5 days/Jeffrey Hedges, PA  Unable to contact patient after 3 attempts, letter will be sent to address on file  Lysle Pearl 06/08/2018, 9:01 AM

## 2018-06-08 NOTE — Telephone Encounter (Signed)
Post ED Visit - Positive Culture Follow-up: Unsuccessful Patient Follow-up  Culture assessed and recommendations reviewed by:  []  Enzo Bi, Pharm.D. []  Celedonio Miyamoto, [address].D., BCPS AQ-ID []  Garvin Fila, Pharm.D., BCPS []  Georgina Pillion, Pharm.D., BCPS []  [place], [place].D., BCPS, AAHIVP []  Estella Husk, Pharm.D., BCPS, AAHIVP []  Sherlynn Carbon, PharmD []  Pollyann Samples, PharmD, BCPS [place], PharmD  Positive urine culture  []  Patient discharged without antimicrobial prescription and treatment is now indicated []  Organism is resistant to prescribed ED discharge antimicrobial []  Patient with positive blood cultures  Plan:  If symptomatic start Nitrofurantoin 100mg  PO BID x 5 days/Jeffrey Hedges, PA  Unable to contact patient after 3 attempts, letter will be sent to address on file  Lysle Pearl 06/08/2018, 9:08 AM

## 2018-06-10 LAB — CULTURE, BLOOD (ROUTINE X 2)
Culture: NO GROWTH
Culture: NO GROWTH
Special Requests: ADEQUATE

## 2018-06-29 ENCOUNTER — Other Ambulatory Visit: Payer: Self-pay | Admitting: *Deleted

## 2018-06-29 NOTE — Patient Outreach (Signed)
[facility]) Care Management  06/29/2018  Caroline Normile Salce 01/25/1941 [id number]   Subjective: Telephone call to patient's home  / mobile number, no answer, left HIPAA compliant voicemail message, and requested call back.     Objective: Per KPN (Knowledge Performance Now, point of care tool) and chart review, patient had ED visit on 06/04/2018 for UTI.   Patient also has a history COPD, BPH, Aortic atherosclerosis, Cervical spondylosis without myelopathy, Chronic bronchitis, Diastolic Congestive Heart Failure,  Diverticulosis of colon, chronic gout,  and Emphysema.        Assessment: Received [employer] referral on 06/27/2018.  Referral source: Clydene Fake.  Referral reason: needs assistance with Breo copay, costing $100 per month.  Patient has given [facility] Care Management / Utilization Management consent to speak with his wife Lupita Leash regarding healthcare needs as needed.  Screening  follow up pending patient contact.       Plan: RNCM will send unsuccessful outreach  letter, [facility] pamphlet, will call patient for 2nd telephone outreach attempt within 4 business days, screening follow up, and will proceed with case closure within 10 business days if no return call.     Pearl Bents H. Gardiner Barefoot, BSN, CCM [facility] Care Management [facility] Telephonic CM Phone: [phone] Fax: [phone]

## 2018-06-30 ENCOUNTER — Other Ambulatory Visit: Payer: Self-pay | Admitting: *Deleted

## 2018-06-30 NOTE — Patient Outreach (Signed)
[facility]) Care Management  06/30/2018  Evan [name] 09/13/1940 [id number]   Subjective: Telephone call to patient's home  / mobile number, no answer, left HIPAA compliant voicemail message, and requested call back.     Objective: Per KPN (Knowledge Performance Now, point of care tool) and chart review, patient had ED visit on 06/04/2018 for UTI.   Patient also has a history COPD, BPH, Aortic atherosclerosis, Cervical spondylosis without myelopathy, Chronic bronchitis, Diastolic Congestive Heart Failure,  Diverticulosis of colon, chronic gout,  and Emphysema.        Assessment: Received [employer] referral on 06/27/2018.  Referral source: Clydene Fake.  Referral reason: needs assistance with Breo copay, costing $100 per month.  Patient has given [facility] Care Management / Utilization Management consent to speak with [name] regarding healthcare needs as needed.  Screening  follow up pending patient contact.       Plan: RNCM has sent unsuccessful outreach  letter, [facility] pamphlet, will call patient for 3rd telephone outreach attempt within 4 business days, screening follow up, and will proceed with case closure within 10 business days if no return call.     [place]akinna Andy H. Gardiner Barefoot, BSN, CCM [facility] Care Management [facility] Telephonic CM Phone: [phone] Fax: [phone]

## 2018-07-03 ENCOUNTER — Other Ambulatory Visit: Payer: Self-pay | Admitting: Family Medicine

## 2018-07-03 ENCOUNTER — Other Ambulatory Visit: Payer: Self-pay | Admitting: *Deleted

## 2018-07-03 DIAGNOSIS — J439 Emphysema, unspecified: Secondary | ICD-10-CM

## 2018-07-03 NOTE — Patient Outreach (Signed)
[facility]) Care Management  07/03/2018  Evan [name] 10/20/1940 [id number]   Subjective: Received voicemail from patient's wife Evan [name], states she is returning call on husband (Evan [name]'s) behalf, and requested call back.   Telephone call to patient's home / mobile number, spoke with patient's wife Evan [name]) per patient's recent verbal consent (verbal consent given to referral source) and written consent (per 09/03/16 Designated Party Release).  Wife stated patient's name, date of birth, and address.   Discussed [facility] Care Management [employer] Utilization Management  follow up, wife voiced understanding, and is in agreement to follow up on patient's behalf.  Wife states patient no longer needs assistance with Brevo copay, is able to afford the $45 copay, will follow up with local pharmacy regarding lower price for this month's medication (dropped from $100 to $45), and how to maintain the lower cost for future refills.  Wife states patient does not have any education material, transition of care, care coordination, disease management, disease monitoring, transportation, community resource, or pharmacy needs at this time.  States she is very appreciative of the follow up and has receive [facility] Care Management information.     Objective:Per KPN (Knowledge Performance Now, point of care tool) and chart review,patient had ED visit on 06/04/2018 for UTI. Patient also has a history COPD, BPH, Aortic atherosclerosis,Cervical spondylosis without myelopathy,Chronic bronchitis,Diastolic CongestiveHeartFailure,Diverticulosis of colon, chronic gout, and Emphysema.      Assessment: Received [employer] referral on 06/27/2018. Referral source: Clydene Fake. Referral reason: needs assistance with Breo copay, costing $100 per month. Patient has given [facility] Care Management / Utilization Management consent to  speak with his wife Evan Leash regarding healthcare needs as needed.  Screening follow up completed and no further care management needs.       Plan:RNCM will complete case closure due to follow up completed / no care management needs.  RNCM will send MD case closure letter.        Jaque Dacy H. Gardiner Barefoot, BSN, CCM [facility] Care Management [facility] Telephonic CM Phone: [phone] Fax: [phone]

## 2018-07-05 ENCOUNTER — Telehealth: Payer: Self-pay | Admitting: Family Medicine

## 2018-07-05 NOTE — Telephone Encounter (Signed)
No one has talked to me about it. I think we would need to see him for an appointment to document oxygen levels so insurance will pay for it. TY.

## 2018-07-05 NOTE — Telephone Encounter (Signed)
Copied from CRM [id number]. Topic: General - Other >> Jul 05, 2018  1:56 PM Floria Raveling A wrote: Reason for CRM: pt wife called in and stated that pt has a cough.  No other symptoms.  She could nor remember if she has talk to wendling before about ordering pt some Oxygen or starting him on Oxygen,  she would like to speak to nurse to see if this is a possibility?     Best number [phone]   Called the wife informed of PCP instructions. She stated she was going to call his pulmonary MD to take care of.

## 2018-07-18 DIAGNOSIS — R339 Retention of urine, unspecified: Secondary | ICD-10-CM | POA: Diagnosis not present

## 2018-08-07 ENCOUNTER — Telehealth: Payer: Self-pay | Admitting: Physician Assistant

## 2018-08-07 NOTE — Telephone Encounter (Signed)
Pt states that he has been experiencing stomach pain and has noticed some change in his bms. He would like some advise.

## 2018-08-07 NOTE — Telephone Encounter (Signed)
Patient calls back. He reports he has noted a change in the caliper of his stool. The bowel movement has become "much smaller." He denies constipation or diarrhea. States he is consistently " every other day" and always has been. He also complains of abdominal pain that comes and goes. It is located below his left rib cage area. He can pinpoint specific areas of this discomfort by pushing on his abdomen. Unable to reproduce the pain otherwise by things such as lying down or bearing down for a bowel movement. Patient asks to have an appointment with Dr Marina Goodell.  Confirmed telephone number as [phone].

## 2018-08-07 NOTE — Telephone Encounter (Signed)
Called the patient. No answer. Left a message on the voicemail to call us again to discuss his concerns.

## 2018-08-09 ENCOUNTER — Encounter: Payer: Self-pay | Admitting: Internal Medicine

## 2018-08-09 ENCOUNTER — Ambulatory Visit (INDEPENDENT_AMBULATORY_CARE_PROVIDER_SITE_OTHER): Payer: Medicare Other | Admitting: Internal Medicine

## 2018-08-09 ENCOUNTER — Other Ambulatory Visit: Payer: Self-pay

## 2018-08-09 VITALS — Ht 66.0 in | Wt 195.0 lb

## 2018-08-09 DIAGNOSIS — K5731 Diverticulosis of large intestine without perforation or abscess with bleeding: Secondary | ICD-10-CM

## 2018-08-09 DIAGNOSIS — R109 Unspecified abdominal pain: Secondary | ICD-10-CM | POA: Diagnosis not present

## 2018-08-09 NOTE — Progress Notes (Signed)
HISTORY OF PRESENT ILLNESS:  Evan [name] is a 78 y.o. male with multiple significant medical problems as listed below who requests this telemedicine visit (during the coronavirus pandemic) regarding recent problems with abdominal pain.  The patient was seen in this office on one occasion in 2017 after being hospitalized with a presumptive diverticular bleed.  He subsequently underwent colonoscopy, as it had been some while since his previous exam, which revealed multiple large mouthed diverticula throughout the entire colon as well as internal hemorrhoids.  His examination was compromised by a poor preparation but no bulky or worrisome lesions identified.  He is wheelchair-bound.  His recent history is that of hospitalization February 2020 with severe coughing and shortness of breath.  He was diagnosed with emphysema with superimposed respiratory infection, likely viral.  Negative testing for influenza.  No abdominal pain at that time.  Blood work revealed hemoglobin 11.9.  Normal lipase and normal liver test.  Chest x-ray revealed emphysema and atypical infiltrative pattern.  CT scan from 2019 revealed renal calculi.  After discharge from the hospital the patient continued to have episodes of coughing.  Approximately 2 or 3 weeks ago he described an episode of very severe coughing.  This was accompanied by the abrupt onset of abdominal pain from the umbilicus to the sternum.  Thereafter with each coughing episode he had discomfort.  No pain otherwise.  Patient's coughing problems have since resolved as has his abdominal discomfort.  He called wanting to know if there was anything he needed to be concerned about.  Upon close questioning the patient denies nausea, vomiting, melena, hematochezia, or change in bowel habits.  He reports good bowel movements daily which are brown.  No fevers.  His appetite continues to improve.  REVIEW OF SYSTEMS:  All non-GI ROS negative unless otherwise stated in the  HPI except for anxiety, weakness, depression  Past Medical History:  Diagnosis Date  . Abnormality of gait    multiple cervical spondylosis  . Anxiety   . Aortic atherosclerosis (HCC)   . Bilateral leg weakness    due to back surgeries--  mostly uses wheelchair and at home very short distant w/ walker  . Bilateral lower extremity edema   . BPH (benign prostatic hyperplasia)   . Cervical spondylosis without myelopathy    per dr cram (neuro surg.)  . Chronic back pain   . Chronic bronchitis (HCC)   . Chronic gout    08-02-2017  per pt gout stable ,  last bout beginning March 2019  . Depression   . Diastolic CHF (HCC)   . Diverticulosis of colon   . Emphysema/COPD [facility]) pulmologist-  dr Chilton Greathouse   last acute exacerbation 07-12-2017 (documented in epic)  . Full dentures   . GERD (gastroesophageal reflux disease)   . Hard of hearing    does not wear hearing aides  . Hemorrhoids   . History of sepsis    07-15-2017  urosepsis secondary to kidney stone extraction--  completed 14d IV vancomyocin   . Hyperlipidemia   . Nephrolithiasis    per CT 07-15-2017 multiple non-obstructive renal stones  . Numbness    hands --- related to neck  . OA (osteoarthritis)    hands  . Pressure ulcer of sacral region, stage 1    per pcp note in epic dated 07-29-2017  . RBBB (right bundle branch block)   . Right ureteral stone   . Self-catheterizes urinary bladder     Past Surgical History:  Procedure Laterality Date  . ABDOMINAL HERNIA REPAIR  1970's  . ANTERIOR CERVICAL DECOMP/DISCECTOMY FUSION  07/03/2001   C4 -- C5/  post op Exploration and evacuation cervical hematoma  . BLEPHAROPLASTY  2008  . CARDIOVASCULAR STRESS TEST  05/27/1998   normal nuclear study w/ no ischemia/  normal LV function and wall motion , ef 64%  . CARPAL TUNNEL RELEASE Left 2003 approx.  . CERVICAL FUSION  1995   C5 -- C6  . COLONOSCOPY WITH PROPOFOL N/A 02/10/2016   Procedure: COLONOSCOPY WITH PROPOFOL;   Surgeon: Hilarie Fredrickson, MD;  Location: [facility];  Service: Endoscopy;  Laterality: N/A;  . CYSTO/  LEFT RETROGRADE PYELOGRAM/  BALLOON DILATION LEFT URETER/  URETEROSCOPY/  STENT PLACEMENT  07/28/2000  . CYSTOSCOPY W/ URETERAL STENT PLACEMENT Right 07/15/2017   Procedure: CYSTOSCOPY WITH RETROGRADE PYELOGRAM/URETERAL STENT PLACEMENT;  Surgeon: Ihor Gully, MD;  Location: [facility];  Service: Urology;  Laterality: Right;  . CYSTOSCOPY/URETEROSCOPY/HOLMIUM LASER/STENT PLACEMENT Right 08/10/2017   Procedure: CYSTOSCOPY/URETEROSCOPY/HOLMIUM LASER/STENT PLACEMENT;  Surgeon: Rene Paci, MD;  Location: [facility];  Service: Urology;  Laterality: Right;  ONLY NEEDS 45 MIN FOR PROCEDURE  . ESOPHAGOGASTRODUODENOSCOPY    . EXPLORATION AND RE-DO CERVICAL FUSION C4--5 AND ANTERIOR CERVIAL DISKECTOMY FUSION C3--4  08/09/2007   POST-OP EXPLORATION AND EVACUATION RETROPHARYNGEAL HEMATOMA  . LAPAROSCOPIC CHOLECYSTECTOMY  1990'S  . LUMBAR LAMINECTOMY/DECOMPRESSION MICRODISCECTOMY  10/29/2011   Procedure: LUMBAR LAMINECTOMY/DECOMPRESSION MICRODISCECTOMY 1 LEVEL;  Surgeon: Mariam Dollar, MD;  Location: [facility];  Service: Neurosurgery;  Laterality: Right;  Right Lumbar Three-Four Lumbar Laminectomy/Microdiscectomy  . MAXIMUM ACCESS (MAS)POSTERIOR LUMBAR INTERBODY FUSION (PLIF) 1 LEVEL N/A 07/22/2014   Procedure: FOR MAXIMUM ACCESS (MAS) POSTERIOR LUMBAR INTERBODY FUSION (PLIF) 1 LEVEL;  Surgeon: Donalee Citrin, MD;  Location: [facility];  Service: Neurosurgery;  Laterality: N/A;  FOR MAXIMUM ACCESS (MAS) POSTERIOR LUMBAR INTERBODY FUSION (PLIF) 1 LEVEL L4-5  . NEUROPLASTY / TRANSPOSITION ULNAR NERVE AT ELBOW Left 07/23/2008  . POSTERIOR CERVICAL FUSION/FORAMINOTOMY  06/09/2011   Procedure: POSTERIOR CERVICAL FUSION/FORAMINOTOMY LEVEL 4;  Surgeon: Mariam Dollar, MD;  Location: [facility];  Service: Neurosurgery;  Laterality: N/A;  Cervical three to seven  posterior cervical fusion, Cervical four  to seven Laminectomy, bone graft from right iliac crest   . POSTERIOR LUMBAR LAMINECTOMY AND DISKECTOMY  10/15/2009   L3 -- L4  . STENT REMOVAL  08/10/2017   Procedure: STENT REMOVAL;  Surgeon: Rene Paci, MD;  Location: [facility];  Service: Urology;;  . TONSILLECTOMY AND ADENOIDECTOMY  child  . TRANSTHORACIC ECHOCARDIOGRAM  11/27/2013   grade 1 diastolic dysfunction, ef 50-55%/  trivial AR and PR/ mild MR and TR/  PASP  . TRANSURETHRAL RESECTION OF PROSTATE N/A 05/31/2016   Procedure: TRANSURETHRAL RESECTION OF THE PROSTATE (TURP);  Surgeon: Jethro Bolus, MD;  Location: [facility];  Service: Urology;  Laterality: N/A;    Social History Zed A Eagleson  reports that he quit smoking about 5 years ago. His smoking use included cigarettes. He has a 82.50 pack-year smoking history. He quit smokeless tobacco use about 18 months ago.  His smokeless tobacco use included chew. He reports that he does not drink alcohol or use drugs.  family history includes Diabetes in his mother; Heart disease in his father; Parkinson's disease in his father.  Allergies  Allergen Reactions  . Hydromorphone Itching  . Oxycodone Itching and Nausea Only       PHYSICAL EXAMINATION:  No physical examination with telemedicine visit    ASSESSMENT:  1.  Musculoskeletal abdominal wall pain secondary to vigorous coughing.  This has resolved. 2.  History of diverticular bleeding.  Colonoscopy with poor preparation 2017 3.  Multiple significant medical problems   PLAN:  1.  Discussed musculoskeletal pain 2.  Reassurance 3.  Resume general medical care with primary provider.  GI follow-up as needed  This telemedicine visit was initiated by the patient (Medicare patient) and consented for by the patient.  He was in his home and I was in my office during the encounter.  He understands her may be an associated professional charge for this  service

## 2018-08-14 ENCOUNTER — Other Ambulatory Visit: Payer: Self-pay | Admitting: Family Medicine

## 2018-08-14 MED ORDER — SERTRALINE HCL 25 MG PO TABS: 25.0000 mg | ORAL_TABLET | Freq: Every day | ORAL | 1 refills | Status: DC

## 2018-08-14 NOTE — Telephone Encounter (Signed)
Requested medication (s) are due for refill today: Yes  Requested medication (s) are on the active medication list: Yes  Last refill:  02/13/18  Future visit scheduled: No  Notes to clinic:  See request    Requested Prescriptions  Pending Prescriptions Disp Refills   sertraline (ZOLOFT) 25 MG tablet 90 tablet 1    Sig: Take 1 tablet (25 mg total) by mouth daily.     Psychiatry:  Antidepressants - SSRI Failed - 08/14/2018  9:55 AM      Failed - Completed PHQ-2 or PHQ-9 in the last 360 days.      Passed - Valid encounter within last 6 months    Recent Outpatient Visits          5 months ago Side pain   Holiday representative at [facility], [place], [place]   6 months ago Epidermal cyst   Holiday representative at [facility], [place], [place]   6 months ago Epidermal cyst   Holiday representative at [facility], [place], [place]   7 months ago Hearing loss of right ear, unspecified hearing loss type   Holiday representative at [facility], [place], [place]   1 year ago Pre-op exam   Holiday representative at [facility], [place], [place]

## 2018-08-17 DIAGNOSIS — N3000 Acute cystitis without hematuria: Secondary | ICD-10-CM | POA: Diagnosis not present

## 2018-09-12 DIAGNOSIS — R339 Retention of urine, unspecified: Secondary | ICD-10-CM | POA: Diagnosis not present

## 2018-09-25 ENCOUNTER — Other Ambulatory Visit: Payer: Self-pay | Admitting: Family Medicine

## 2018-09-25 ENCOUNTER — Ambulatory Visit: Payer: Self-pay | Admitting: *Deleted

## 2018-09-25 DIAGNOSIS — L89312 Pressure ulcer of right buttock, stage 2: Secondary | ICD-10-CM

## 2018-09-25 DIAGNOSIS — L89321 Pressure ulcer of left buttock, stage 1: Secondary | ICD-10-CM

## 2018-09-25 NOTE — Progress Notes (Signed)
am

## 2018-09-25 NOTE — Telephone Encounter (Signed)
OK to refer to wound. Ty.

## 2018-09-25 NOTE — Telephone Encounter (Signed)
Please advise 

## 2018-09-25 NOTE — Telephone Encounter (Signed)
Referral done///the wife is informed

## 2018-09-25 NOTE — Telephone Encounter (Signed)
Message from Sheran Luz sent at 09/25/2018 12:09 PM EDT   Summary: pressure sores    Patient requesting call back from nurse to discuss "pressure sores" on bottom. He states that they are very painful and continue to bleed.          Pt called wanting to know if there is another doctor that specializes in wound care that he could see in stead of his pcp. Advised wife and pt that he would need to have a referral from his primary to see another doctor. Advised that he should try to stay off his bottom regarding his pressure sores as much as possible to help with the discomfort of the bedsores. Wife stated that she has been cleaning the areas with saline and using saline solution to the areas and also used neosporin. Using a Band-Aid and now using gauze dressing. Advised to try not to put a lot of tape on the area because it could irritate the skin more. Ask the pcp regarding a home health nurse to come with changing the dressings. Also advised pt to not try to sit all day every day to relieve some pressure. Wife stated that he wants to sit in his wheelchair or recliner all day. He is taking ASA for pain. Advised to try Tylenol to alternate instead of ASA every time. She voiced understanding. Will route to [facility] at [facility] for review and recommendation. Reason for Disposition . Nursing judgment  Protocols used: NO GUIDELINE OR REFERENCE AVAILABLE-A-AH

## 2018-09-26 ENCOUNTER — Telehealth: Payer: Self-pay | Admitting: Family Medicine

## 2018-09-26 NOTE — Telephone Encounter (Signed)
Called the wife Butch Penny to inform referral has been done. Number they can call to schedule is [phone] [facility] on [address]. In [place] Also told her to let Dr. Nani Ravens know if they cannot get him in soon this week.

## 2018-09-26 NOTE — Telephone Encounter (Signed)
Patient's wife, Evan [name], requesting call back from Shirlean Mylar to discuss status of referral. She states that they have not heard anything from [place] and patient is in considerable amount of pain.

## 2018-09-26 NOTE — Telephone Encounter (Signed)
Copied from [place] [id number]. Topic: Referral - Status >> Sep 26, 2018 11:53 AM Reyne Dumas L wrote: Reason for CRM:   Pt's wife calling.  States she was supposed to report that she hasn't yet heard from the [place] for an appointment.  Could you check on this referral please

## 2018-09-29 ENCOUNTER — Ambulatory Visit: Payer: Medicare Other | Admitting: Family Medicine

## 2018-10-02 ENCOUNTER — Encounter (HOSPITAL_BASED_OUTPATIENT_CLINIC_OR_DEPARTMENT_OTHER): Payer: Medicare Other | Attending: Internal Medicine

## 2018-10-02 ENCOUNTER — Other Ambulatory Visit: Payer: Self-pay

## 2018-10-02 DIAGNOSIS — I509 Heart failure, unspecified: Secondary | ICD-10-CM | POA: Diagnosis not present

## 2018-10-02 DIAGNOSIS — I110 Hypertensive heart disease with heart failure: Secondary | ICD-10-CM | POA: Insufficient documentation

## 2018-10-02 DIAGNOSIS — Z87891 Personal history of nicotine dependence: Secondary | ICD-10-CM | POA: Diagnosis not present

## 2018-10-02 DIAGNOSIS — L2489 Irritant contact dermatitis due to other agents: Secondary | ICD-10-CM | POA: Diagnosis not present

## 2018-10-02 DIAGNOSIS — M4302 Spondylolysis, cervical region: Secondary | ICD-10-CM | POA: Diagnosis not present

## 2018-10-02 DIAGNOSIS — S31829A Unspecified open wound of left buttock, initial encounter: Secondary | ICD-10-CM | POA: Diagnosis not present

## 2018-10-02 DIAGNOSIS — S31819A Unspecified open wound of right buttock, initial encounter: Secondary | ICD-10-CM | POA: Diagnosis not present

## 2018-10-10 DIAGNOSIS — R339 Retention of urine, unspecified: Secondary | ICD-10-CM | POA: Diagnosis not present

## 2018-10-30 DIAGNOSIS — R3914 Feeling of incomplete bladder emptying: Secondary | ICD-10-CM | POA: Diagnosis not present

## 2018-10-30 DIAGNOSIS — K5900 Constipation, unspecified: Secondary | ICD-10-CM | POA: Diagnosis not present

## 2018-10-30 DIAGNOSIS — N3021 Other chronic cystitis with hematuria: Secondary | ICD-10-CM | POA: Diagnosis not present

## 2018-11-06 DIAGNOSIS — R339 Retention of urine, unspecified: Secondary | ICD-10-CM | POA: Diagnosis not present

## 2018-11-30 DIAGNOSIS — R339 Retention of urine, unspecified: Secondary | ICD-10-CM | POA: Diagnosis not present

## 2018-12-15 ENCOUNTER — Other Ambulatory Visit: Payer: Self-pay | Admitting: Family Medicine

## 2018-12-18 ENCOUNTER — Other Ambulatory Visit: Payer: Self-pay | Admitting: Family Medicine

## 2018-12-18 DIAGNOSIS — J439 Emphysema, unspecified: Secondary | ICD-10-CM

## 2018-12-22 ENCOUNTER — Other Ambulatory Visit: Payer: Self-pay | Admitting: Family Medicine

## 2018-12-22 ENCOUNTER — Telehealth: Payer: Self-pay | Admitting: Pulmonary Disease

## 2018-12-22 DIAGNOSIS — J441 Chronic obstructive pulmonary disease with (acute) exacerbation: Secondary | ICD-10-CM

## 2018-12-22 MED ORDER — AZITHROMYCIN 250 MG PO TABS: ORAL_TABLET | ORAL | 0 refills | Status: DC

## 2018-12-22 MED ORDER — PREDNISONE 20 MG PO TABS: 40.0000 mg | ORAL_TABLET | Freq: Every day | ORAL | 0 refills | Status: DC

## 2018-12-22 NOTE — Telephone Encounter (Signed)
Yes. Make appointment next week to be seen as tele visit.  However since this is the long weekend coming up I am afraid that he may worsen so we will treat in the meantime.  Called in prednisone 40 mg a day for 5 days and Z-Pak.

## 2018-12-22 NOTE — Telephone Encounter (Signed)
Called pt and advised message from the provider. Pt understood and verbalized understanding. Nothing further is needed.   Rx's sent into [place]. Also made an appointment fort a Tuesday. Nothing further is needed.

## 2018-12-22 NOTE — Telephone Encounter (Signed)
Spoke with pt, he has been coughing a lot. He is using his nebulizer and woke up this morning coughing with pinkish mucus. He coughed all night last night and denies fever. Butch Penny wants to know if  She should bring him into the office to be seen. She thinks he may be using the nebulizer machine too much and just wants to know what to do from here. Dr. Vaughan Browner please advise.

## 2018-12-26 ENCOUNTER — Ambulatory Visit: Payer: Medicare Other | Admitting: Pulmonary Disease

## 2018-12-26 ENCOUNTER — Encounter: Payer: Self-pay | Admitting: Pulmonary Disease

## 2018-12-26 ENCOUNTER — Ambulatory Visit (INDEPENDENT_AMBULATORY_CARE_PROVIDER_SITE_OTHER): Payer: Medicare Other | Admitting: Pulmonary Disease

## 2018-12-26 ENCOUNTER — Other Ambulatory Visit: Payer: Self-pay

## 2018-12-26 DIAGNOSIS — R918 Other nonspecific abnormal finding of lung field: Secondary | ICD-10-CM | POA: Diagnosis not present

## 2018-12-26 DIAGNOSIS — J449 Chronic obstructive pulmonary disease, unspecified: Secondary | ICD-10-CM

## 2018-12-26 NOTE — Patient Instructions (Addendum)
Evan [name] Side feeling better with antibiotics and the steroids Continue inhalers prescribed You will need a follow-up CT for monitoring lung nodules that was noticed at last CT scan Follow-up in 6 months

## 2018-12-26 NOTE — Addendum Note (Signed)
Addended by: Hildred Alamin I on: 12/26/2018 02:33 PM   Modules accepted: Orders

## 2018-12-26 NOTE — Progress Notes (Signed)
Virtual Visit via Telephone Note  I connected with Evan [name] on 12/26/18 at 10:30 AM EDT by telephone and verified that I am speaking with the correct person using two identifiers.  Location: Patient: Home Provider: [facility], [place], [place]   I discussed the limitations, risks, security and privacy concerns of performing an evaluation and management service by telephone and the availability of in person appointments. I also discussed with the patient that there may be a patient responsible charge related to this service. The patient expressed understanding and agreed to proceed.   History of Present Illness: Chief complaint:  Follow up for emphysema, chronic bronchitis Asthma  HPI: 78 year old heavy smoker with emphysema, chronic bronchitis.  He has complained of cough for the past 3-4 months with white mucus, dyspnea, wheezing.  Denies any fevers, chills.  He was seen by primary care and given doxycycline and prednisone. He is on albuterol inhaler and never been on controller medication.  He was evaluated in the pulmonary clinic in 2015 and PFTs ordered but did not get done   Hospitalized in April 2019 for septic shock, ureteral stone with hydronephrosis and coag negative bacteremia  Pets: 2 dogs.  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  Quit in 2016 Travel History: Not significant  Data Reviewed: Low-dose screening CT of the chest 07/19/16- emphysema, left base scarring.  Scattered tiny pulmonary nodules. Low-dose screening CT of the chest 02/08/17-stable emphysema, left base scarring and stable pulmonary nodules. Low-dose screening CT of the chest 02/09/2018- emphysema, stable pulmonary nodules.  Irregularly-shaped nodule right upper lobe. I have reviewed the images personally.  PFTs 05/31/17 FVC  2.73 [77%], FEV1 2.08 [82%], F/F 76, TLC 84%, DLCO 54% Mild obstruction, moderate diffusion defect  FENO  07/12/17-88  09/15/2017-47  CBC 07/12/2017-WBC 9, eos 0 Blood allergy profile 07/12/2017-IgE 157, RAST panel is negative   Observations/Objective: Patient called on 9/4 with worsening dyspnea, congestion.  He was prescribed azithromycin and prednisone.  States that he is much better now with improvement in symptoms Has chronic dyspnea on exertion Continues Breo inhaler  Assessment and Plan: COPD, moderate asthma with exacerbation Symptoms are improving on antibiotics and steroids Continue Breo, albuterol as needed, Mucinex and flutter valve  Lung nodule CT ordered but was not done yet We will reorder CT scan.  Follow Up Instructions: - Continue Breo - CT chest.   I discussed the assessment and treatment plan with the patient. The patient was provided an opportunity to ask questions and all were answered. The patient agreed with the plan and demonstrated an understanding of the instructions.   The patient was advised to call back or seek an in-person evaluation if the symptoms worsen or if the condition fails to improve as anticipated.  I provided 25 minutes of non-face-to-face time during this encounter.   Marshell Garfinkel MD [facility] 12/26/2018, 1:37 PM

## 2018-12-27 DIAGNOSIS — R339 Retention of urine, unspecified: Secondary | ICD-10-CM | POA: Diagnosis not present

## 2019-01-11 ENCOUNTER — Other Ambulatory Visit: Payer: Self-pay

## 2019-01-11 ENCOUNTER — Ambulatory Visit (HOSPITAL_BASED_OUTPATIENT_CLINIC_OR_DEPARTMENT_OTHER)
Admission: RE | Admit: 2019-01-11 | Discharge: 2019-01-11 | Disposition: A | Discharge status: Home / Self Care | Payer: Medicare Other | Source: Ambulatory Visit | Attending: Pulmonary Disease | Admitting: Pulmonary Disease

## 2019-01-11 DIAGNOSIS — R918 Other nonspecific abnormal finding of lung field: Secondary | ICD-10-CM | POA: Diagnosis not present

## 2019-01-11 DIAGNOSIS — Z87891 Personal history of nicotine dependence: Secondary | ICD-10-CM | POA: Diagnosis not present

## 2019-01-11 DIAGNOSIS — I2510 Atherosclerotic heart disease of native coronary artery without angina pectoris: Secondary | ICD-10-CM | POA: Diagnosis not present

## 2019-01-11 DIAGNOSIS — Z122 Encounter for screening for malignant neoplasm of respiratory organs: Secondary | ICD-10-CM | POA: Diagnosis not present

## 2019-01-11 DIAGNOSIS — J432 Centrilobular emphysema: Secondary | ICD-10-CM | POA: Diagnosis not present

## 2019-01-12 DIAGNOSIS — Z23 Encounter for immunization: Secondary | ICD-10-CM | POA: Diagnosis not present

## 2019-01-22 ENCOUNTER — Telehealth: Payer: Self-pay | Admitting: Pulmonary Disease

## 2019-01-22 NOTE — Telephone Encounter (Signed)
Per the pt's wife request, we will call back AFTER 4pm.

## 2019-01-22 NOTE — Telephone Encounter (Signed)
Notes recorded by Marshell Garfinkel, MD on 01/19/2019 at 5:24 PM EDT  CT shows lung nodules are stable  There is also evidence of emphysema and calcification in one of the valves of the heart. There are no worrisome findings overall  Spoke with Butch Penny and notified of results per Dr. Vaughan Browner  She verbalized understanding  Nothing further needed

## 2019-02-07 ENCOUNTER — Other Ambulatory Visit: Payer: Self-pay | Admitting: Family Medicine

## 2019-02-24 DIAGNOSIS — R339 Retention of urine, unspecified: Secondary | ICD-10-CM | POA: Diagnosis not present

## 2019-02-28 ENCOUNTER — Encounter: Payer: Medicare Other | Admitting: Family Medicine

## 2019-03-03 ENCOUNTER — Other Ambulatory Visit: Payer: Self-pay | Admitting: Family Medicine

## 2019-03-06 ENCOUNTER — Ambulatory Visit (INDEPENDENT_AMBULATORY_CARE_PROVIDER_SITE_OTHER): Payer: Medicare Other | Admitting: Family Medicine

## 2019-03-06 ENCOUNTER — Encounter: Payer: Self-pay | Admitting: Family Medicine

## 2019-03-06 ENCOUNTER — Other Ambulatory Visit: Payer: Self-pay

## 2019-03-06 VITALS — BP 120/78 | HR 72 | Temp 96.9°F | Ht 65.5 in | Wt 187.0 lb

## 2019-03-06 DIAGNOSIS — Z136 Encounter for screening for cardiovascular disorders: Secondary | ICD-10-CM

## 2019-03-06 DIAGNOSIS — R29898 Other symptoms and signs involving the musculoskeletal system: Secondary | ICD-10-CM

## 2019-03-06 DIAGNOSIS — R5381 Other malaise: Secondary | ICD-10-CM | POA: Diagnosis not present

## 2019-03-06 DIAGNOSIS — Z0001 Encounter for general adult medical examination with abnormal findings: Secondary | ICD-10-CM | POA: Diagnosis not present

## 2019-03-06 DIAGNOSIS — Z0000 Encounter for general adult medical examination without abnormal findings: Secondary | ICD-10-CM

## 2019-03-06 DIAGNOSIS — L989 Disorder of the skin and subcutaneous tissue, unspecified: Secondary | ICD-10-CM

## 2019-03-06 MED ORDER — ATORVASTATIN CALCIUM 20 MG PO TABS: 20.0000 mg | ORAL_TABLET | Freq: Every day | ORAL | 2 refills | Status: DC

## 2019-03-06 MED ORDER — SERTRALINE HCL 50 MG PO TABS: 50.0000 mg | ORAL_TABLET | Freq: Every day | ORAL | 2 refills | Status: DC

## 2019-03-06 MED ORDER — ALLOPURINOL 100 MG PO TABS: 100.0000 mg | ORAL_TABLET | Freq: Every day | ORAL | 2 refills | Status: DC

## 2019-03-06 NOTE — Patient Instructions (Signed)
If the skin lesion on the scalp does not improve over next couple weeks, come back here and we can take it off.   Give [place] 2-3 business days to get the results of your labs back.   Keep the diet clean and stay active.  If you do not hear anything about your referrals (PT and the ultrasound) in the next 1-2 weeks, call our office and ask for an update.  Let us know if you need anything.

## 2019-03-06 NOTE — Progress Notes (Signed)
Chief Complaint  Patient presents with  . Annual Exam    Well Male Evan [name] is here for a complete physical.  Here w spouse who helps provide hx.  His last physical was >1 year ago.  Current diet: in general, overall healthy.   Current exercise: none Weight trend: stable Daytime fatigue? No. Seat belt? Yes.    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 Does not want to get hearing aids 2/2 price. Wife is concenred about possible ear wax build up.  Has had a lesion on his scalp over past year. He consistently itches it, but denies itching. Using lotion, but not helpful. No pain or drainage.  Hx of b/l LE weakness after lumbar laminectomy procedure. He was referred to PT in past, did not stay with it. Interested again. No recent inj or change. He is wheelchair bound and overall non-ambulatory.   Past Medical History:  Diagnosis Date  . Abnormality of gait    multiple cervical spondylosis  . Anxiety   . Aortic atherosclerosis ([place])   . Bilateral leg weakness    due to back surgeries--  mostly uses wheelchair and at home very short distant w/ walker  . Bilateral lower extremity edema   . BPH (benign prostatic hyperplasia)   . Cervical spondylosis without myelopathy    per dr cram (neuro surg.)  . Chronic back pain   . Chronic bronchitis ([place])   . Chronic gout    08-02-2017  per pt gout stable ,  last bout beginning March 2019  . Depression   . Diastolic CHF ([place])   . Diverticulosis of colon   . Emphysema/COPD [facility]) pulmologist-  dr Marshell Garfinkel   last acute exacerbation 07-12-2017 (documented in epic)  . Full dentures   . GERD (gastroesophageal reflux disease)   . Hard of hearing    does not wear hearing aides  . Hemorrhoids   . History of sepsis    07-15-2017  urosepsis secondary to kidney stone extraction--  completed 14d IV vancomyocin    . Hyperlipidemia   . Nephrolithiasis    per CT 07-15-2017 multiple non-obstructive renal stones  . Numbness    hands --- related to neck  . OA (osteoarthritis)    hands  . Pressure ulcer of sacral region, stage 1    per pcp note in epic dated 07-29-2017  . RBBB (right bundle branch block)   . Right ureteral stone   . Self-catheterizes urinary bladder      Past Surgical History:  Procedure Laterality Date  . ABDOMINAL HERNIA REPAIR  1970's  . ANTERIOR CERVICAL DECOMP/DISCECTOMY FUSION  07/03/2001   C4 -- C5/  post op Exploration and evacuation cervical hematoma  . BLEPHAROPLASTY  2008  . CARDIOVASCULAR STRESS TEST  05/27/1998   normal nuclear study w/ no ischemia/  normal LV function and wall motion , ef 64%  . CARPAL TUNNEL RELEASE Left 2003 approx.  . CERVICAL FUSION  1995   C5 -- C6  . COLONOSCOPY WITH PROPOFOL N/A 02/10/2016   Procedure: COLONOSCOPY WITH PROPOFOL;  Surgeon: Irene Shipper, MD;  Location: [facility];  Service: Endoscopy;  Laterality: N/A;  . CYSTO/  LEFT RETROGRADE PYELOGRAM/  BALLOON DILATION LEFT URETER/  URETEROSCOPY/  STENT PLACEMENT  07/28/2000  . CYSTOSCOPY W/ URETERAL STENT PLACEMENT Right 07/15/2017   Procedure: CYSTOSCOPY WITH RETROGRADE PYELOGRAM/URETERAL STENT PLACEMENT;  Surgeon: Kathie Rhodes, MD;  Location: [facility];  Service: Urology;  Laterality: Right;  . CYSTOSCOPY/URETEROSCOPY/HOLMIUM LASER/STENT PLACEMENT Right 08/10/2017   Procedure: CYSTOSCOPY/URETEROSCOPY/HOLMIUM LASER/STENT PLACEMENT;  Surgeon: Ceasar Mons, MD;  Location: [facility];  Service: Urology;  Laterality: Right;  ONLY NEEDS 45 MIN FOR PROCEDURE  . ESOPHAGOGASTRODUODENOSCOPY    . EXPLORATION AND RE-DO CERVICAL FUSION C4--5 AND ANTERIOR CERVIAL DISKECTOMY FUSION C3--4  08/09/2007   POST-OP EXPLORATION AND EVACUATION RETROPHARYNGEAL HEMATOMA  . LAPAROSCOPIC CHOLECYSTECTOMY  1990'S  . LUMBAR LAMINECTOMY/DECOMPRESSION MICRODISCECTOMY  10/29/2011    Procedure: LUMBAR LAMINECTOMY/DECOMPRESSION MICRODISCECTOMY 1 LEVEL;  Surgeon: Elaina Hoops, MD;  Location: [facility];  Service: Neurosurgery;  Laterality: Right;  Right Lumbar Three-Four Lumbar Laminectomy/Microdiscectomy  . MAXIMUM ACCESS (MAS)POSTERIOR LUMBAR INTERBODY FUSION (PLIF) 1 LEVEL N/A 07/22/2014   Procedure: FOR MAXIMUM ACCESS (MAS) POSTERIOR LUMBAR INTERBODY FUSION (PLIF) 1 LEVEL;  Surgeon: Kary Kos, MD;  Location: [place] [facility];  Service: Neurosurgery;  Laterality: N/A;  FOR MAXIMUM ACCESS (MAS) POSTERIOR LUMBAR INTERBODY FUSION (PLIF) 1 LEVEL L4-5  . NEUROPLASTY / TRANSPOSITION ULNAR NERVE AT ELBOW Left 07/23/2008  . POSTERIOR CERVICAL FUSION/FORAMINOTOMY  06/09/2011   Procedure: POSTERIOR CERVICAL FUSION/FORAMINOTOMY LEVEL 4;  Surgeon: Elaina Hoops, MD;  Location: [place] [facility];  Service: Neurosurgery;  Laterality: N/A;  Cervical three to seven  posterior cervical fusion, Cervical four to seven Laminectomy, bone graft from right iliac crest   . POSTERIOR LUMBAR LAMINECTOMY AND DISKECTOMY  10/15/2009   L3 -- L4  . STENT REMOVAL  08/10/2017   Procedure: STENT REMOVAL;  Surgeon: Ceasar Mons, MD;  Location: [facility];  Service: Urology;;  . TONSILLECTOMY AND ADENOIDECTOMY  child  . TRANSTHORACIC ECHOCARDIOGRAM  11/27/2013   grade 1 diastolic dysfunction, ef 88-89%/  trivial AR and PR/ mild MR and TR/  PASP 58mHg  . TRANSURETHRAL RESECTION OF PROSTATE N/A 05/31/2016   Procedure: TRANSURETHRAL RESECTION OF THE PROSTATE (TURP);  Surgeon: SCarolan Clines MD;  [facility]  Service: Urology;  Laterality: N/A;    Medications  Current Outpatient Medications on File Prior to Visit  Medication Sig Dispense Refill  . albuterol (PROVENTIL HFA;VENTOLIN HFA) 108 (90 Base) MCG/ACT inhaler Inhale 2 puffs into the lungs every 6 (six) hours as needed for wheezing or shortness of breath. 1 Inhaler 2  . allopurinol (ZYLOPRIM) 100 MG tablet Take  1 tablet (100 mg total) by mouth daily. 30 tablet 6  . atorvastatin (LIPITOR) 20 MG tablet TAKE 1 TABLET BY MOUTH DAILY 90 tablet 0  . BREO ELLIPTA 200-25 MCG/INH AEPB INHALE 1 PUFF INTO THE LUNGS DAILY 60 each 2  . colchicine 0.6 MG tablet TAKE 1 TABLET BY MOUTH EVERY 2 HOURS UNTIL RELIEF as needed 30 tablet 0  . diclofenac sodium (VOLTAREN) 1 % GEL Apply 2 g 4 (four) times daily topically. 1 Tube 2  . FLOVENT HFA 110 MCG/ACT inhaler INHALE 2 PUFFS INTO THE LUNGS TWICE DAILY 12 g 2  . ipratropium-albuterol (DUONEB) 0.5-2.5 (3) MG/3ML SOLN Take 3 mLs by nebulization every 6 (six) hours as needed (wheezing/shortness of breath). 360 mL 0  . magnesium hydroxide (MILK OF MAGNESIA) 800 MG/5ML suspension Take by mouth daily as needed for constipation.    . Multiple Vitamin (MULTIVITAMIN WITH MINERALS) TABS tablet Take 1 tablet by mouth daily.    . niacin 500 MG tablet Take 500 mg by mouth daily.     .Marland Kitchenomeprazole (PRILOSEC) 20 MG capsule TAKE 1  CAPSULE BY MOUTH DAILY IN THE MORNING 90 capsule 1  . Polyethyl Glycol-Propyl Glycol (SYSTANE OP) Place 2 drops into both eyes at bedtime as needed (For dry eyes.).     Marland Kitchen potassium chloride (KLOR-CON) 10 MEQ tablet TAKE 2 TABLETS BY MOUTH DAILY 60 tablet 1  . predniSONE (DELTASONE) 20 MG tablet Take 2 tablets (40 mg total) by mouth daily with breakfast. 10 tablet 0  . Probiotic Product (PROBIOTIC PO) Take 1 capsule by mouth every morning.     . sertraline (ZOLOFT) 25 MG tablet Take 1 tablet (25 mg total) by mouth daily. 90 tablet 1  . tamsulosin (FLOMAX) 0.4 MG CAPS capsule TAKE 2 CAPSULES BY MOUTH EVERY MORNING 180 capsule 0  . tiZANidine (ZANAFLEX) 4 MG tablet Take 1 tablet (4 mg total) by mouth every 8 (eight) hours as needed for muscle spasms. 30 tablet 0  . traMADol (ULTRAM) 50 MG tablet Take 1 tablet (50 mg total) by mouth every 12 (twelve) hours as needed for moderate pain. 12 tablet 0    Allergies Allergies  Allergen Reactions  . Hydromorphone Itching   . Oxycodone Itching and Nausea Only    Family History Family History  Problem Relation Age of Onset  . Diabetes Mother   . Parkinson's disease Father   . Heart disease Father     Review of Systems: Constitutional:  no fevers or chills Eye:  no recent significant change in vision Ear/Nose/Mouth/Throat:  Ears:  no recent hearing loss Nose/Mouth/Throat:  no complaints of nasal congestion or sore throat Cardiovascular:  no chest pain Respiratory:  no shortness of breath Gastrointestinal:  no abdominal pain, no change in bowel habits GU:  Male: negative for dysuria Musculoskeletal/Extremities:  no pain of the joints Integumentary (Skin): +skin lesion on scalp Neurologic:  no headaches, Endocrine:  No unexpected weight changes Hematologic/Lymphatic:  +easy bruising  Exam BP 120/78 (BP Location: Left Arm, Patient Position: Sitting, Cuff Size: Normal)   Pulse 72   Temp (!) 96.9 F (36.1 C) (Temporal)   Ht 5' 5.5" (1.664 m)   Wt 187 lb (84.8 kg)   SpO2 93%   BMI 30.65 kg/m  General:  well developed, well nourished, in no apparent distress Skin: +scaly lesions x2 on posterior L scalp; otherwise no significant moles, warts, or growths Head:  no masses, lesions, or tenderness Eyes:  pupils equal and round, sclera anicteric without injection Ears:  canals without lesions, TMs shiny without retraction, no obvious effusion, no erythema; hard of hearing Nose:  nares patent, septum midline, mucosa normal Throat/Pharynx:  lips and gingiva without lesion; tongue and uvula midline; non-inflamed pharynx; no exudates or postnasal drainage Neck: neck supple without adenopathy, thyromegaly, or masses Lungs:  clear to auscultation, breath sounds equal bilaterally, no respiratory distress Cardio:  regular rate and rhythm, no LE edema or bruits Rectal: Deferred Musculoskeletal:  no atrophy or deformity Neuro:  3/5 strength with R hip flexion and L hip flexion. 4/5 strength with knee  flexion/extension; pt is not ambulatory Psych: well oriented with normal range of affect and appropriate judgment/insight  Procedure note: cryotherapy Verbal consent obtained 2 skin lesions treated Liquid nitrogen was applied via a thin spray creating an ice ball with 1-2 mm corona surrounding the lesion The patient tolerated the procedure well There were no immediate complications noted  Assessment and Plan  Well adult exam - Plan: Comp Met (CMET), Lipid Profile  Physical deconditioning - Plan: Ambulatory referral to Home Health  Weakness of both lower  extremities - Plan: Ambulatory referral to [place] for AAA (abdominal aortic aneurysm) - Plan: US AORTA MEDICARE SCREENING   Well 78 y.o. male. Counseled on diet and exercise. Other orders as above. Follow up in 6 mo for med ck; 2 weeks if skin does not improve.  The patient and his spouse voiced understanding and agreement to the plan.  [place], DO 03/06/19 2:26 PM

## 2019-03-07 LAB — COMPREHENSIVE METABOLIC PANEL
ALT: 13 U/L (ref 0–53)
AST: 19 U/L (ref 0–37)
Albumin: 3.9 g/dL (ref 3.5–5.2)
Alkaline Phosphatase: 111 U/L (ref 39–117)
BUN: 22 mg/dL (ref 6–23)
CO2: 27 mEq/L (ref 19–32)
Calcium: 9.3 mg/dL (ref 8.4–10.5)
Chloride: 102 mEq/L (ref 96–112)
Creatinine, Ser: 0.95 mg/dL (ref 0.40–1.50)
GFR: 76.67 mL/min (ref >60.00)
Glucose, Bld: 82 mg/dL (ref 70–99)
Potassium: 4.6 mEq/L (ref 3.5–5.1)
Sodium: 139 mEq/L (ref 135–145)
Total Bilirubin: 0.6 mg/dL (ref 0.2–1.2)
Total Protein: 6.7 g/dL (ref 6.0–8.3)

## 2019-03-07 LAB — LIPID PANEL
Cholesterol: 147 mg/dL (ref 0–200)
HDL: 50.3 mg/dL (ref >39.00)
LDL Cholesterol: 78 mg/dL (ref 0–99)
NonHDL: 97.19
Total CHOL/HDL Ratio: 3
Triglycerides: 97 mg/dL (ref 0.0–149.0)
VLDL: 19.4 mg/dL (ref 0.0–40.0)

## 2019-03-08 ENCOUNTER — Telehealth: Payer: Self-pay | Admitting: Family Medicine

## 2019-03-08 DIAGNOSIS — K219 Gastro-esophageal reflux disease without esophagitis: Secondary | ICD-10-CM | POA: Diagnosis not present

## 2019-03-08 DIAGNOSIS — I4510 Unspecified right bundle-branch block: Secondary | ICD-10-CM | POA: Diagnosis not present

## 2019-03-08 DIAGNOSIS — M19042 Primary osteoarthritis, left hand: Secondary | ICD-10-CM | POA: Diagnosis not present

## 2019-03-08 DIAGNOSIS — I700 Atherosclerosis of aorta: Secondary | ICD-10-CM | POA: Diagnosis not present

## 2019-03-08 DIAGNOSIS — M19041 Primary osteoarthritis, right hand: Secondary | ICD-10-CM | POA: Diagnosis not present

## 2019-03-08 DIAGNOSIS — J439 Emphysema, unspecified: Secondary | ICD-10-CM | POA: Diagnosis not present

## 2019-03-08 DIAGNOSIS — E785 Hyperlipidemia, unspecified: Secondary | ICD-10-CM | POA: Diagnosis not present

## 2019-03-08 DIAGNOSIS — G8929 Other chronic pain: Secondary | ICD-10-CM | POA: Diagnosis not present

## 2019-03-08 DIAGNOSIS — M47892 Other spondylosis, cervical region: Secondary | ICD-10-CM | POA: Diagnosis not present

## 2019-03-08 DIAGNOSIS — I5030 Unspecified diastolic (congestive) heart failure: Secondary | ICD-10-CM | POA: Diagnosis not present

## 2019-03-08 NOTE — Telephone Encounter (Signed)
Home Health Verbal Orders - Caller/Agency:  [facility] Callback Number: [phone] FIEPPI RJJO  Requesting OT/PT/Skilled Nursing/Social Work/Speech Therapy: PT Frequency: 1x for 2wks, 2x for 2wks

## 2019-03-08 NOTE — Telephone Encounter (Signed)
LMOM for [facility] w/ verbal orders.  

## 2019-03-13 DIAGNOSIS — K219 Gastro-esophageal reflux disease without esophagitis: Secondary | ICD-10-CM | POA: Diagnosis not present

## 2019-03-13 DIAGNOSIS — G8929 Other chronic pain: Secondary | ICD-10-CM | POA: Diagnosis not present

## 2019-03-13 DIAGNOSIS — I4510 Unspecified right bundle-branch block: Secondary | ICD-10-CM | POA: Diagnosis not present

## 2019-03-13 DIAGNOSIS — M47892 Other spondylosis, cervical region: Secondary | ICD-10-CM | POA: Diagnosis not present

## 2019-03-13 DIAGNOSIS — M19041 Primary osteoarthritis, right hand: Secondary | ICD-10-CM | POA: Diagnosis not present

## 2019-03-13 DIAGNOSIS — I5030 Unspecified diastolic (congestive) heart failure: Secondary | ICD-10-CM | POA: Diagnosis not present

## 2019-03-13 DIAGNOSIS — I700 Atherosclerosis of aorta: Secondary | ICD-10-CM | POA: Diagnosis not present

## 2019-03-13 DIAGNOSIS — M19042 Primary osteoarthritis, left hand: Secondary | ICD-10-CM | POA: Diagnosis not present

## 2019-03-13 DIAGNOSIS — J439 Emphysema, unspecified: Secondary | ICD-10-CM | POA: Diagnosis not present

## 2019-03-13 DIAGNOSIS — E785 Hyperlipidemia, unspecified: Secondary | ICD-10-CM | POA: Diagnosis not present

## 2019-03-19 DIAGNOSIS — M19041 Primary osteoarthritis, right hand: Secondary | ICD-10-CM | POA: Diagnosis not present

## 2019-03-19 DIAGNOSIS — I4510 Unspecified right bundle-branch block: Secondary | ICD-10-CM | POA: Diagnosis not present

## 2019-03-19 DIAGNOSIS — E785 Hyperlipidemia, unspecified: Secondary | ICD-10-CM | POA: Diagnosis not present

## 2019-03-19 DIAGNOSIS — I5030 Unspecified diastolic (congestive) heart failure: Secondary | ICD-10-CM | POA: Diagnosis not present

## 2019-03-19 DIAGNOSIS — G8929 Other chronic pain: Secondary | ICD-10-CM | POA: Diagnosis not present

## 2019-03-19 DIAGNOSIS — J439 Emphysema, unspecified: Secondary | ICD-10-CM | POA: Diagnosis not present

## 2019-03-19 DIAGNOSIS — I700 Atherosclerosis of aorta: Secondary | ICD-10-CM | POA: Diagnosis not present

## 2019-03-19 DIAGNOSIS — M19042 Primary osteoarthritis, left hand: Secondary | ICD-10-CM | POA: Diagnosis not present

## 2019-03-19 DIAGNOSIS — M47892 Other spondylosis, cervical region: Secondary | ICD-10-CM | POA: Diagnosis not present

## 2019-03-19 DIAGNOSIS — K219 Gastro-esophageal reflux disease without esophagitis: Secondary | ICD-10-CM | POA: Diagnosis not present

## 2019-03-20 ENCOUNTER — Ambulatory Visit (HOSPITAL_BASED_OUTPATIENT_CLINIC_OR_DEPARTMENT_OTHER): Payer: Medicare Other

## 2019-03-20 DIAGNOSIS — R339 Retention of urine, unspecified: Secondary | ICD-10-CM | POA: Diagnosis not present

## 2019-03-21 ENCOUNTER — Ambulatory Visit (HOSPITAL_BASED_OUTPATIENT_CLINIC_OR_DEPARTMENT_OTHER)
Admission: RE | Admit: 2019-03-21 | Discharge: 2019-03-21 | Disposition: A | Discharge status: Home / Self Care | Payer: Medicare Other | Source: Ambulatory Visit | Attending: Family Medicine | Admitting: Family Medicine

## 2019-03-21 ENCOUNTER — Other Ambulatory Visit: Payer: Self-pay

## 2019-03-21 ENCOUNTER — Other Ambulatory Visit: Payer: Self-pay | Admitting: Family Medicine

## 2019-03-21 DIAGNOSIS — Z87891 Personal history of nicotine dependence: Secondary | ICD-10-CM | POA: Insufficient documentation

## 2019-03-21 DIAGNOSIS — I700 Atherosclerosis of aorta: Secondary | ICD-10-CM | POA: Insufficient documentation

## 2019-03-21 DIAGNOSIS — Z136 Encounter for screening for cardiovascular disorders: Secondary | ICD-10-CM

## 2019-03-21 DIAGNOSIS — I77811 Abdominal aortic ectasia: Secondary | ICD-10-CM | POA: Diagnosis not present

## 2019-03-22 DIAGNOSIS — M47892 Other spondylosis, cervical region: Secondary | ICD-10-CM | POA: Diagnosis not present

## 2019-03-22 DIAGNOSIS — K219 Gastro-esophageal reflux disease without esophagitis: Secondary | ICD-10-CM | POA: Diagnosis not present

## 2019-03-22 DIAGNOSIS — I700 Atherosclerosis of aorta: Secondary | ICD-10-CM | POA: Diagnosis not present

## 2019-03-22 DIAGNOSIS — G8929 Other chronic pain: Secondary | ICD-10-CM | POA: Diagnosis not present

## 2019-03-22 DIAGNOSIS — I5030 Unspecified diastolic (congestive) heart failure: Secondary | ICD-10-CM | POA: Diagnosis not present

## 2019-03-22 DIAGNOSIS — M19041 Primary osteoarthritis, right hand: Secondary | ICD-10-CM | POA: Diagnosis not present

## 2019-03-22 DIAGNOSIS — J439 Emphysema, unspecified: Secondary | ICD-10-CM | POA: Diagnosis not present

## 2019-03-22 DIAGNOSIS — E785 Hyperlipidemia, unspecified: Secondary | ICD-10-CM | POA: Diagnosis not present

## 2019-03-22 DIAGNOSIS — I4510 Unspecified right bundle-branch block: Secondary | ICD-10-CM | POA: Diagnosis not present

## 2019-03-22 DIAGNOSIS — M19042 Primary osteoarthritis, left hand: Secondary | ICD-10-CM | POA: Diagnosis not present

## 2019-03-26 DIAGNOSIS — J439 Emphysema, unspecified: Secondary | ICD-10-CM | POA: Diagnosis not present

## 2019-03-26 DIAGNOSIS — G8929 Other chronic pain: Secondary | ICD-10-CM | POA: Diagnosis not present

## 2019-03-26 DIAGNOSIS — I4510 Unspecified right bundle-branch block: Secondary | ICD-10-CM | POA: Diagnosis not present

## 2019-03-26 DIAGNOSIS — M19042 Primary osteoarthritis, left hand: Secondary | ICD-10-CM | POA: Diagnosis not present

## 2019-03-26 DIAGNOSIS — I5030 Unspecified diastolic (congestive) heart failure: Secondary | ICD-10-CM | POA: Diagnosis not present

## 2019-03-26 DIAGNOSIS — K219 Gastro-esophageal reflux disease without esophagitis: Secondary | ICD-10-CM | POA: Diagnosis not present

## 2019-03-26 DIAGNOSIS — E785 Hyperlipidemia, unspecified: Secondary | ICD-10-CM | POA: Diagnosis not present

## 2019-03-26 DIAGNOSIS — M19041 Primary osteoarthritis, right hand: Secondary | ICD-10-CM | POA: Diagnosis not present

## 2019-03-26 DIAGNOSIS — M47892 Other spondylosis, cervical region: Secondary | ICD-10-CM | POA: Diagnosis not present

## 2019-03-26 DIAGNOSIS — I700 Atherosclerosis of aorta: Secondary | ICD-10-CM | POA: Diagnosis not present

## 2019-03-28 DIAGNOSIS — M47892 Other spondylosis, cervical region: Secondary | ICD-10-CM | POA: Diagnosis not present

## 2019-03-28 DIAGNOSIS — I4510 Unspecified right bundle-branch block: Secondary | ICD-10-CM | POA: Diagnosis not present

## 2019-03-28 DIAGNOSIS — K219 Gastro-esophageal reflux disease without esophagitis: Secondary | ICD-10-CM | POA: Diagnosis not present

## 2019-03-28 DIAGNOSIS — I700 Atherosclerosis of aorta: Secondary | ICD-10-CM | POA: Diagnosis not present

## 2019-03-28 DIAGNOSIS — M19041 Primary osteoarthritis, right hand: Secondary | ICD-10-CM | POA: Diagnosis not present

## 2019-03-28 DIAGNOSIS — E785 Hyperlipidemia, unspecified: Secondary | ICD-10-CM | POA: Diagnosis not present

## 2019-03-28 DIAGNOSIS — G8929 Other chronic pain: Secondary | ICD-10-CM | POA: Diagnosis not present

## 2019-03-28 DIAGNOSIS — M19042 Primary osteoarthritis, left hand: Secondary | ICD-10-CM | POA: Diagnosis not present

## 2019-03-28 DIAGNOSIS — I5030 Unspecified diastolic (congestive) heart failure: Secondary | ICD-10-CM | POA: Diagnosis not present

## 2019-03-28 DIAGNOSIS — J439 Emphysema, unspecified: Secondary | ICD-10-CM | POA: Diagnosis not present

## 2019-04-03 ENCOUNTER — Telehealth: Payer: Self-pay | Admitting: Family Medicine

## 2019-04-03 DIAGNOSIS — I5030 Unspecified diastolic (congestive) heart failure: Secondary | ICD-10-CM | POA: Diagnosis not present

## 2019-04-03 DIAGNOSIS — G8929 Other chronic pain: Secondary | ICD-10-CM | POA: Diagnosis not present

## 2019-04-03 DIAGNOSIS — M19041 Primary osteoarthritis, right hand: Secondary | ICD-10-CM | POA: Diagnosis not present

## 2019-04-03 DIAGNOSIS — I700 Atherosclerosis of aorta: Secondary | ICD-10-CM | POA: Diagnosis not present

## 2019-04-03 DIAGNOSIS — E785 Hyperlipidemia, unspecified: Secondary | ICD-10-CM | POA: Diagnosis not present

## 2019-04-03 DIAGNOSIS — J439 Emphysema, unspecified: Secondary | ICD-10-CM | POA: Diagnosis not present

## 2019-04-03 DIAGNOSIS — M47892 Other spondylosis, cervical region: Secondary | ICD-10-CM | POA: Diagnosis not present

## 2019-04-03 DIAGNOSIS — K219 Gastro-esophageal reflux disease without esophagitis: Secondary | ICD-10-CM | POA: Diagnosis not present

## 2019-04-03 DIAGNOSIS — M19042 Primary osteoarthritis, left hand: Secondary | ICD-10-CM | POA: Diagnosis not present

## 2019-04-03 DIAGNOSIS — I4510 Unspecified right bundle-branch block: Secondary | ICD-10-CM | POA: Diagnosis not present

## 2019-04-03 NOTE — Telephone Encounter (Signed)
Copied from [place] [id number]. Topic: General - Other >> Apr 03, 2019 12:00 PM Keene Breath wrote: Reason for CRM: Called to inform the doctor that patient has been leaning to the right side recently and has had more frequent headaches.  Please advise and call with questions at [phone]

## 2019-04-04 ENCOUNTER — Ambulatory Visit (INDEPENDENT_AMBULATORY_CARE_PROVIDER_SITE_OTHER): Payer: Medicare Other | Admitting: Family Medicine

## 2019-04-04 ENCOUNTER — Other Ambulatory Visit: Payer: Self-pay

## 2019-04-04 ENCOUNTER — Encounter: Payer: Self-pay | Admitting: Family Medicine

## 2019-04-04 DIAGNOSIS — R519 Headache, unspecified: Secondary | ICD-10-CM | POA: Diagnosis not present

## 2019-04-04 NOTE — Telephone Encounter (Signed)
Yes please. Ty ? ?

## 2019-04-04 NOTE — Telephone Encounter (Signed)
Does the patient need an appt/virtual?

## 2019-04-04 NOTE — Telephone Encounter (Signed)
Scheduled doxy today at 2:15

## 2019-04-04 NOTE — Progress Notes (Signed)
Chief Complaint  Patient presents with  . Headache    more frequent    Subjective: Patient is a 78 y.o. male here for HA. Due to COVID-19 pandemic, we are interacting via web portal for an electronic face-to-face visit. I verified patient's ID using 2 identifiers. Patient agreed to proceed with visit via this method. Patient is at home, I am at office. Patient, his wife and I are present for visit.   HA's located on top of head over past few mo. No inj or change in activity.  Lasts until he takes ASA or Tylenol. 5-6/10 severity without radiation. 3-4x/week on average. No triggers. No hx of migraines. No N/V, photo/phonophobia, inj, new neck pain. He has been leaning to L when it happens, doesn't realize it is happening.  ROS: Neuro: No current headache  Past Medical History:  Diagnosis Date  . Abnormality of gait    multiple cervical spondylosis  . Anxiety   . Aortic atherosclerosis ([place])   . Bilateral leg weakness    due to back surgeries--  mostly uses wheelchair and at home very short distant w/ walker  . Bilateral lower extremity edema   . BPH (benign prostatic hyperplasia)   . Cervical spondylosis without myelopathy    per dr cram (neuro surg.)  . Chronic back pain   . Chronic bronchitis ([place])   . Chronic gout    08-02-2017  per pt gout stable ,  last bout beginning March 2019  . Depression   . Diastolic CHF ([place])   . Diverticulosis of colon   . Emphysema/COPD [facility]) pulmologist-  dr Marshell Garfinkel   last acute exacerbation 07-12-2017 (documented in epic)  . Full dentures   . GERD (gastroesophageal reflux disease)   . Hard of hearing    does not wear hearing aides  . Hemorrhoids   . History of sepsis    07-15-2017  urosepsis secondary to kidney stone extraction--  completed 14d IV vancomyocin   . Hyperlipidemia   . Nephrolithiasis    per CT 07-15-2017 multiple non-obstructive renal stones  . Numbness    hands --- related to neck  . OA (osteoarthritis)    hands  .  Pressure ulcer of sacral region, stage 1    per pcp note in epic dated 07-29-2017  . RBBB (right bundle branch block)   . Right ureteral stone   . Self-catheterizes urinary bladder     Objective: No conversational dyspnea Age appropriate judgment and insight Nml affect and mood  Assessment and Plan: New onset of headaches - Plan: Ambulatory referral to Neurology  Refer to neuro, will consider CT head if unable to get in within a reasonable time frame. Age is a worrisome feature without sig HA hx.  F/u prn.  The patient and his wife voiced understanding and agreement to the plan.  [place], DO 04/04/19  2:54 PM

## 2019-04-05 DIAGNOSIS — K219 Gastro-esophageal reflux disease without esophagitis: Secondary | ICD-10-CM | POA: Diagnosis not present

## 2019-04-05 DIAGNOSIS — I4510 Unspecified right bundle-branch block: Secondary | ICD-10-CM | POA: Diagnosis not present

## 2019-04-05 DIAGNOSIS — E785 Hyperlipidemia, unspecified: Secondary | ICD-10-CM | POA: Diagnosis not present

## 2019-04-05 DIAGNOSIS — M47892 Other spondylosis, cervical region: Secondary | ICD-10-CM | POA: Diagnosis not present

## 2019-04-05 DIAGNOSIS — M19041 Primary osteoarthritis, right hand: Secondary | ICD-10-CM | POA: Diagnosis not present

## 2019-04-05 DIAGNOSIS — J439 Emphysema, unspecified: Secondary | ICD-10-CM | POA: Diagnosis not present

## 2019-04-05 DIAGNOSIS — I5030 Unspecified diastolic (congestive) heart failure: Secondary | ICD-10-CM | POA: Diagnosis not present

## 2019-04-05 DIAGNOSIS — M19042 Primary osteoarthritis, left hand: Secondary | ICD-10-CM | POA: Diagnosis not present

## 2019-04-05 DIAGNOSIS — G8929 Other chronic pain: Secondary | ICD-10-CM | POA: Diagnosis not present

## 2019-04-05 DIAGNOSIS — I700 Atherosclerosis of aorta: Secondary | ICD-10-CM | POA: Diagnosis not present

## 2019-04-10 ENCOUNTER — Other Ambulatory Visit: Payer: Self-pay | Admitting: Family Medicine

## 2019-04-16 ENCOUNTER — Telehealth: Payer: Self-pay | Admitting: Neurology

## 2019-04-16 NOTE — Telephone Encounter (Signed)
We've received an internal referral on patient for new onset headaches. He's seen Dr. Jannifer Franklin in the past (2017), but is requesting to switch to Dr. Leta Baptist since his wife is a patient of his. Would you both be ok with this?

## 2019-04-17 DIAGNOSIS — I4510 Unspecified right bundle-branch block: Secondary | ICD-10-CM | POA: Diagnosis not present

## 2019-04-17 DIAGNOSIS — M47892 Other spondylosis, cervical region: Secondary | ICD-10-CM | POA: Diagnosis not present

## 2019-04-17 DIAGNOSIS — E785 Hyperlipidemia, unspecified: Secondary | ICD-10-CM | POA: Diagnosis not present

## 2019-04-17 DIAGNOSIS — J439 Emphysema, unspecified: Secondary | ICD-10-CM | POA: Diagnosis not present

## 2019-04-17 DIAGNOSIS — I700 Atherosclerosis of aorta: Secondary | ICD-10-CM | POA: Diagnosis not present

## 2019-04-17 DIAGNOSIS — G8929 Other chronic pain: Secondary | ICD-10-CM | POA: Diagnosis not present

## 2019-04-17 DIAGNOSIS — K219 Gastro-esophageal reflux disease without esophagitis: Secondary | ICD-10-CM | POA: Diagnosis not present

## 2019-04-17 DIAGNOSIS — M19042 Primary osteoarthritis, left hand: Secondary | ICD-10-CM | POA: Diagnosis not present

## 2019-04-17 DIAGNOSIS — M19041 Primary osteoarthritis, right hand: Secondary | ICD-10-CM | POA: Diagnosis not present

## 2019-04-17 DIAGNOSIS — I5030 Unspecified diastolic (congestive) heart failure: Secondary | ICD-10-CM | POA: Diagnosis not present

## 2019-04-17 NOTE — Telephone Encounter (Signed)
Okay for switch

## 2019-04-19 DIAGNOSIS — M47892 Other spondylosis, cervical region: Secondary | ICD-10-CM | POA: Diagnosis not present

## 2019-04-19 DIAGNOSIS — I4510 Unspecified right bundle-branch block: Secondary | ICD-10-CM | POA: Diagnosis not present

## 2019-04-19 DIAGNOSIS — E785 Hyperlipidemia, unspecified: Secondary | ICD-10-CM | POA: Diagnosis not present

## 2019-04-19 DIAGNOSIS — M19041 Primary osteoarthritis, right hand: Secondary | ICD-10-CM | POA: Diagnosis not present

## 2019-04-19 DIAGNOSIS — K219 Gastro-esophageal reflux disease without esophagitis: Secondary | ICD-10-CM | POA: Diagnosis not present

## 2019-04-19 DIAGNOSIS — G8929 Other chronic pain: Secondary | ICD-10-CM | POA: Diagnosis not present

## 2019-04-19 DIAGNOSIS — I700 Atherosclerosis of aorta: Secondary | ICD-10-CM | POA: Diagnosis not present

## 2019-04-19 DIAGNOSIS — I5030 Unspecified diastolic (congestive) heart failure: Secondary | ICD-10-CM | POA: Diagnosis not present

## 2019-04-19 DIAGNOSIS — M19042 Primary osteoarthritis, left hand: Secondary | ICD-10-CM | POA: Diagnosis not present

## 2019-04-19 DIAGNOSIS — J439 Emphysema, unspecified: Secondary | ICD-10-CM | POA: Diagnosis not present

## 2019-04-23 DIAGNOSIS — G8929 Other chronic pain: Secondary | ICD-10-CM | POA: Diagnosis not present

## 2019-04-23 DIAGNOSIS — E785 Hyperlipidemia, unspecified: Secondary | ICD-10-CM | POA: Diagnosis not present

## 2019-04-23 DIAGNOSIS — I4510 Unspecified right bundle-branch block: Secondary | ICD-10-CM | POA: Diagnosis not present

## 2019-04-23 DIAGNOSIS — M47892 Other spondylosis, cervical region: Secondary | ICD-10-CM | POA: Diagnosis not present

## 2019-04-23 DIAGNOSIS — I700 Atherosclerosis of aorta: Secondary | ICD-10-CM | POA: Diagnosis not present

## 2019-04-23 DIAGNOSIS — I5030 Unspecified diastolic (congestive) heart failure: Secondary | ICD-10-CM | POA: Diagnosis not present

## 2019-04-23 DIAGNOSIS — M19041 Primary osteoarthritis, right hand: Secondary | ICD-10-CM | POA: Diagnosis not present

## 2019-04-23 DIAGNOSIS — M19042 Primary osteoarthritis, left hand: Secondary | ICD-10-CM | POA: Diagnosis not present

## 2019-04-23 DIAGNOSIS — J439 Emphysema, unspecified: Secondary | ICD-10-CM | POA: Diagnosis not present

## 2019-04-23 DIAGNOSIS — K219 Gastro-esophageal reflux disease without esophagitis: Secondary | ICD-10-CM | POA: Diagnosis not present

## 2019-04-25 ENCOUNTER — Telehealth: Payer: Self-pay | Admitting: Diagnostic Neuroimaging

## 2019-04-25 NOTE — Telephone Encounter (Signed)
Dr. Anne Hahn said ok to the switch. Are you alright with seeing pt?

## 2019-04-25 NOTE — Telephone Encounter (Signed)
Yes ok to switch. -VRP 

## 2019-04-26 ENCOUNTER — Telehealth: Payer: Self-pay | Admitting: *Deleted

## 2019-04-26 DIAGNOSIS — J441 Chronic obstructive pulmonary disease with (acute) exacerbation: Secondary | ICD-10-CM

## 2019-04-26 DIAGNOSIS — G20 Parkinson's disease: Secondary | ICD-10-CM

## 2019-04-26 DIAGNOSIS — R5381 Other malaise: Secondary | ICD-10-CM

## 2019-04-26 NOTE — Telephone Encounter (Signed)
Copied from CRM [id number]. Topic: General - Inquiry >> Apr 26, 2019 12:58 PM Reggie Pile, NT wrote: Reason for CRM: Pt's wife called in stating she would like if PT could continue to see patient. Please advise and call back.

## 2019-04-27 IMAGING — DX DG SHOULDER 2+V*L*
3 series · 3 of 3 positions shown · non-contrast
Comparison: None.

CLINICAL DATA: Chronic left shoulder pain without known injury.

EXAM:
LEFT SHOULDER - 2+ VIEW

[shoulder grashey]
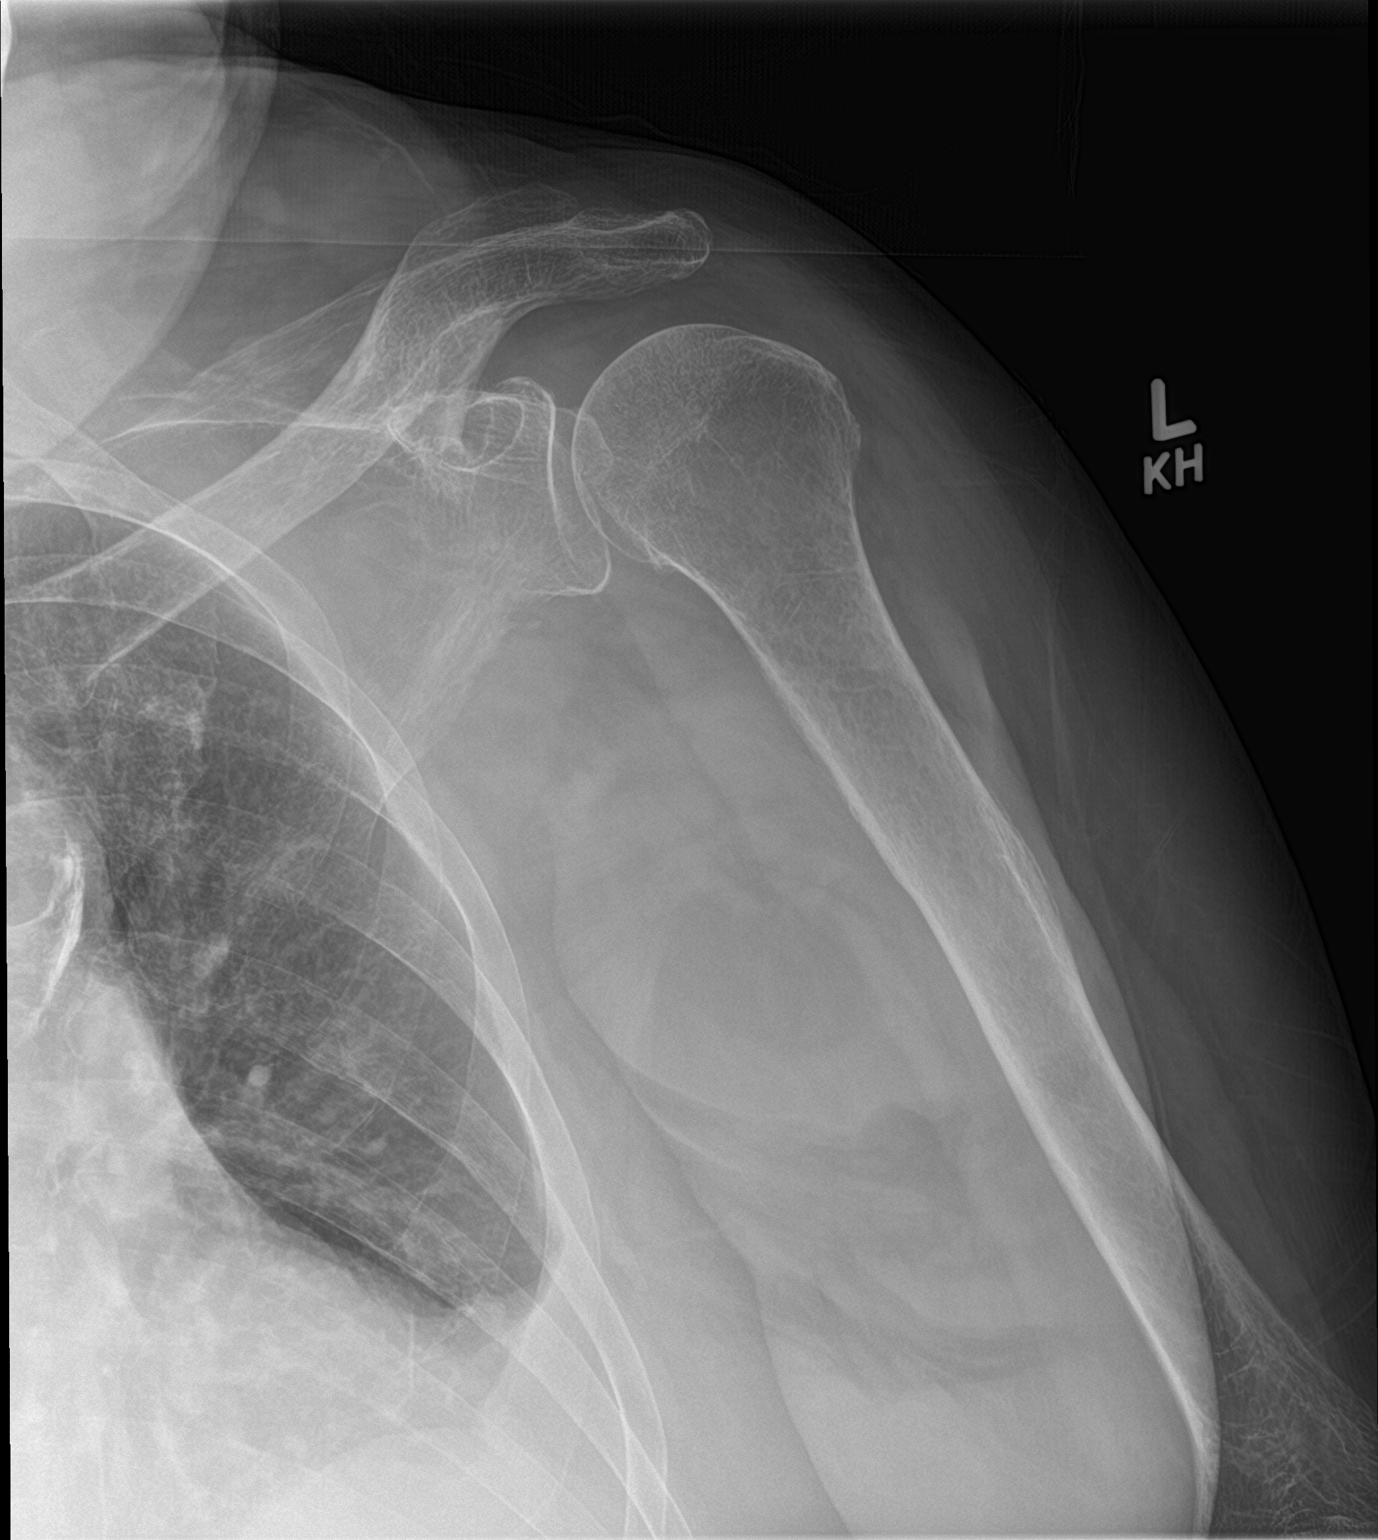

[shoulder y view]
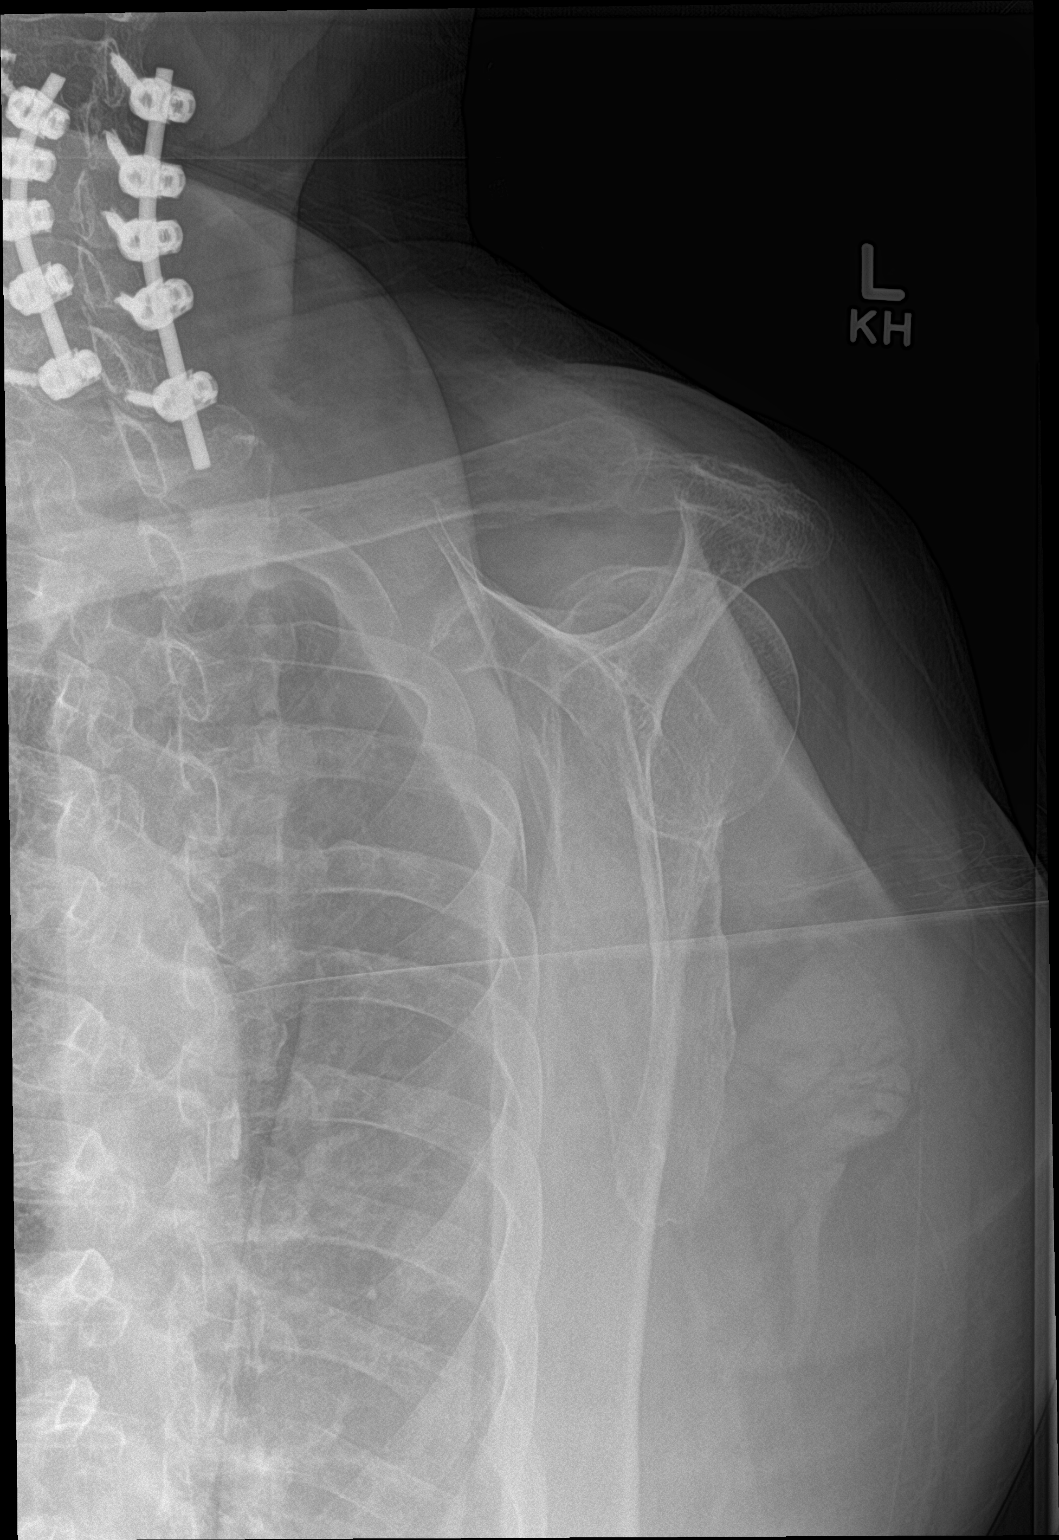

[shoulder axillary]
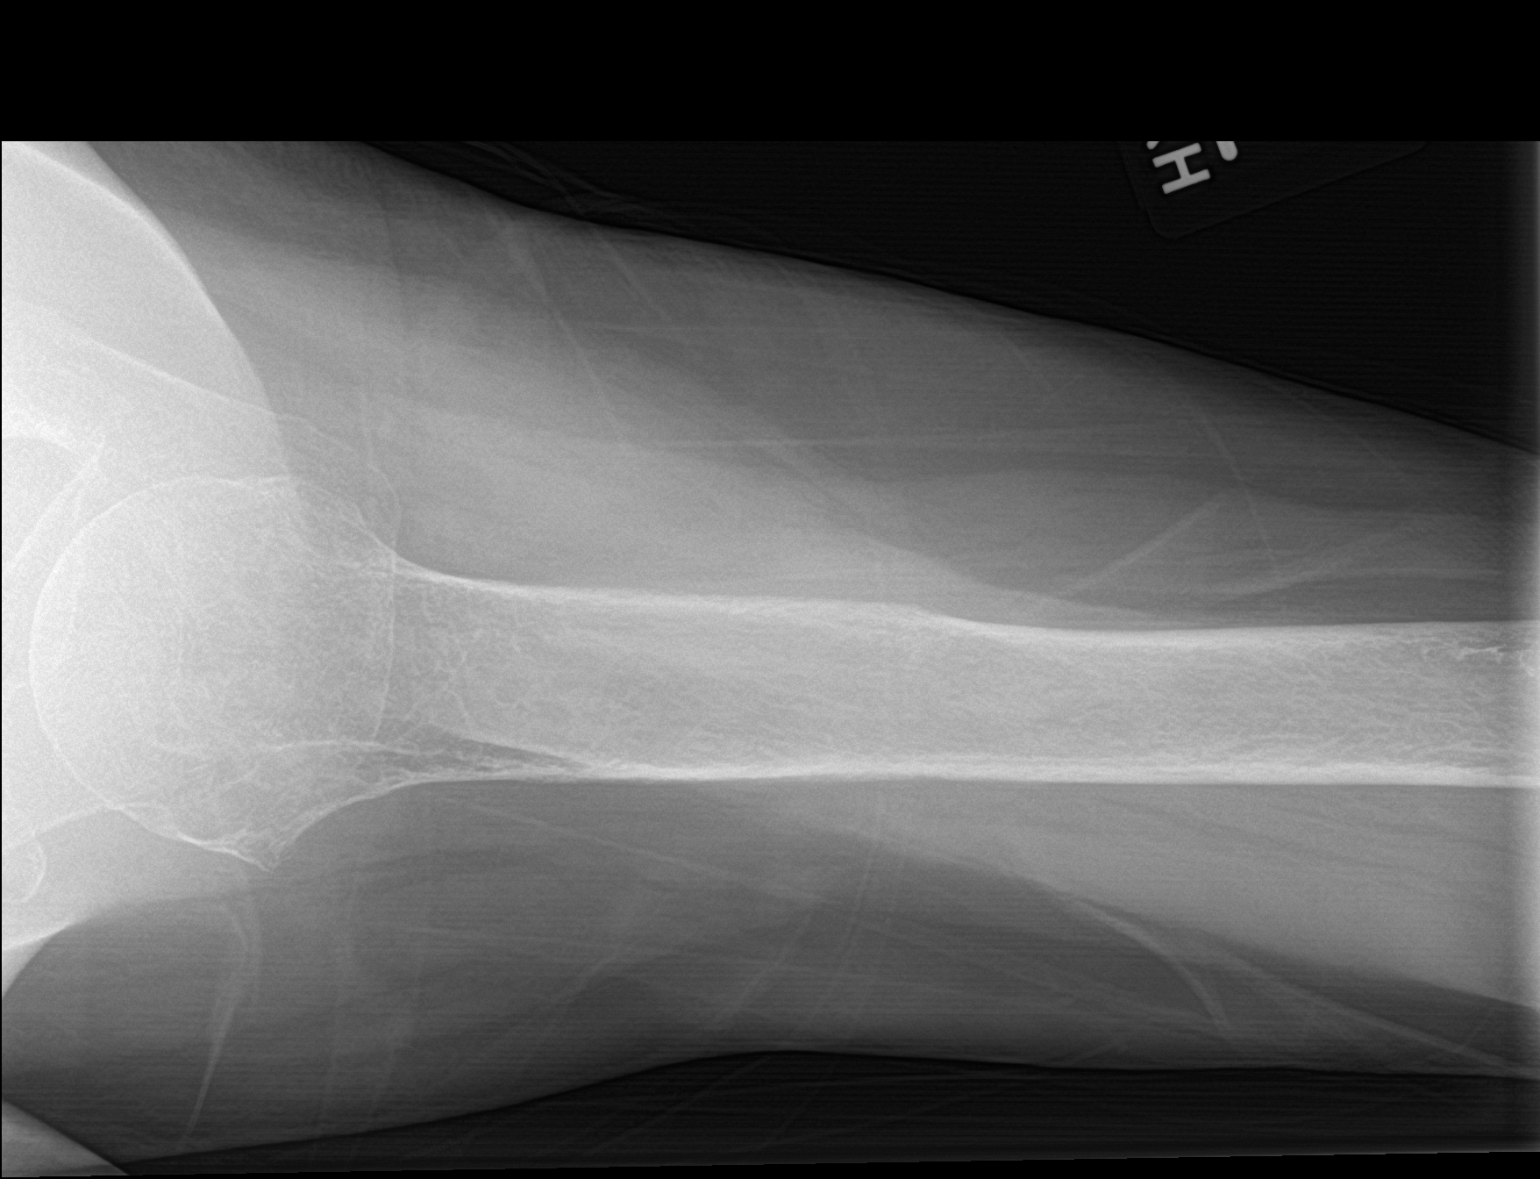

[3 of 3 positions shown; findings below may reference images not displayed]

FINDINGS: There is no evidence of fracture or dislocation. There is no
evidence of arthropathy or other focal bone abnormality. Soft
tissues are unremarkable.
IMPRESSION: Normal left shoulder.

## 2019-04-27 NOTE — Addendum Note (Signed)
Addended by: Scharlene Gloss B on: 04/27/2019 08:35 AM   Modules accepted: Orders

## 2019-04-27 NOTE — Telephone Encounter (Signed)
That's fine. Ty.  

## 2019-04-27 NOTE — Telephone Encounter (Signed)
Informed the wife PT order put in again to continue

## 2019-04-28 DIAGNOSIS — M19041 Primary osteoarthritis, right hand: Secondary | ICD-10-CM | POA: Diagnosis not present

## 2019-04-28 DIAGNOSIS — J439 Emphysema, unspecified: Secondary | ICD-10-CM | POA: Diagnosis not present

## 2019-04-28 DIAGNOSIS — E785 Hyperlipidemia, unspecified: Secondary | ICD-10-CM | POA: Diagnosis not present

## 2019-04-28 DIAGNOSIS — I4510 Unspecified right bundle-branch block: Secondary | ICD-10-CM | POA: Diagnosis not present

## 2019-04-28 DIAGNOSIS — M19042 Primary osteoarthritis, left hand: Secondary | ICD-10-CM | POA: Diagnosis not present

## 2019-04-28 DIAGNOSIS — I700 Atherosclerosis of aorta: Secondary | ICD-10-CM | POA: Diagnosis not present

## 2019-04-28 DIAGNOSIS — I5030 Unspecified diastolic (congestive) heart failure: Secondary | ICD-10-CM | POA: Diagnosis not present

## 2019-04-28 DIAGNOSIS — M47892 Other spondylosis, cervical region: Secondary | ICD-10-CM | POA: Diagnosis not present

## 2019-04-28 DIAGNOSIS — G8929 Other chronic pain: Secondary | ICD-10-CM | POA: Diagnosis not present

## 2019-04-28 DIAGNOSIS — K219 Gastro-esophageal reflux disease without esophagitis: Secondary | ICD-10-CM | POA: Diagnosis not present

## 2019-05-02 DIAGNOSIS — R339 Retention of urine, unspecified: Secondary | ICD-10-CM | POA: Diagnosis not present

## 2019-05-03 DIAGNOSIS — I5030 Unspecified diastolic (congestive) heart failure: Secondary | ICD-10-CM | POA: Diagnosis not present

## 2019-05-03 DIAGNOSIS — M47892 Other spondylosis, cervical region: Secondary | ICD-10-CM | POA: Diagnosis not present

## 2019-05-03 DIAGNOSIS — M19041 Primary osteoarthritis, right hand: Secondary | ICD-10-CM | POA: Diagnosis not present

## 2019-05-03 DIAGNOSIS — I700 Atherosclerosis of aorta: Secondary | ICD-10-CM | POA: Diagnosis not present

## 2019-05-03 DIAGNOSIS — K219 Gastro-esophageal reflux disease without esophagitis: Secondary | ICD-10-CM | POA: Diagnosis not present

## 2019-05-03 DIAGNOSIS — J439 Emphysema, unspecified: Secondary | ICD-10-CM | POA: Diagnosis not present

## 2019-05-03 DIAGNOSIS — G8929 Other chronic pain: Secondary | ICD-10-CM | POA: Diagnosis not present

## 2019-05-03 DIAGNOSIS — E785 Hyperlipidemia, unspecified: Secondary | ICD-10-CM | POA: Diagnosis not present

## 2019-05-03 DIAGNOSIS — I4510 Unspecified right bundle-branch block: Secondary | ICD-10-CM | POA: Diagnosis not present

## 2019-05-03 DIAGNOSIS — M19042 Primary osteoarthritis, left hand: Secondary | ICD-10-CM | POA: Diagnosis not present

## 2019-05-08 DIAGNOSIS — G8929 Other chronic pain: Secondary | ICD-10-CM | POA: Diagnosis not present

## 2019-05-08 DIAGNOSIS — E785 Hyperlipidemia, unspecified: Secondary | ICD-10-CM | POA: Diagnosis not present

## 2019-05-08 DIAGNOSIS — J439 Emphysema, unspecified: Secondary | ICD-10-CM | POA: Diagnosis not present

## 2019-05-08 DIAGNOSIS — M47892 Other spondylosis, cervical region: Secondary | ICD-10-CM | POA: Diagnosis not present

## 2019-05-08 DIAGNOSIS — M19042 Primary osteoarthritis, left hand: Secondary | ICD-10-CM | POA: Diagnosis not present

## 2019-05-08 DIAGNOSIS — M19041 Primary osteoarthritis, right hand: Secondary | ICD-10-CM | POA: Diagnosis not present

## 2019-05-08 DIAGNOSIS — I5030 Unspecified diastolic (congestive) heart failure: Secondary | ICD-10-CM | POA: Diagnosis not present

## 2019-05-08 DIAGNOSIS — I700 Atherosclerosis of aorta: Secondary | ICD-10-CM | POA: Diagnosis not present

## 2019-05-08 DIAGNOSIS — I4510 Unspecified right bundle-branch block: Secondary | ICD-10-CM | POA: Diagnosis not present

## 2019-05-08 DIAGNOSIS — G20 Parkinson's disease: Secondary | ICD-10-CM | POA: Diagnosis not present

## 2019-05-11 DIAGNOSIS — J439 Emphysema, unspecified: Secondary | ICD-10-CM | POA: Diagnosis not present

## 2019-05-11 DIAGNOSIS — I700 Atherosclerosis of aorta: Secondary | ICD-10-CM | POA: Diagnosis not present

## 2019-05-11 DIAGNOSIS — G8929 Other chronic pain: Secondary | ICD-10-CM | POA: Diagnosis not present

## 2019-05-11 DIAGNOSIS — I4510 Unspecified right bundle-branch block: Secondary | ICD-10-CM | POA: Diagnosis not present

## 2019-05-11 DIAGNOSIS — M19042 Primary osteoarthritis, left hand: Secondary | ICD-10-CM | POA: Diagnosis not present

## 2019-05-11 DIAGNOSIS — E785 Hyperlipidemia, unspecified: Secondary | ICD-10-CM | POA: Diagnosis not present

## 2019-05-11 DIAGNOSIS — M47892 Other spondylosis, cervical region: Secondary | ICD-10-CM | POA: Diagnosis not present

## 2019-05-11 DIAGNOSIS — M19041 Primary osteoarthritis, right hand: Secondary | ICD-10-CM | POA: Diagnosis not present

## 2019-05-11 DIAGNOSIS — I5030 Unspecified diastolic (congestive) heart failure: Secondary | ICD-10-CM | POA: Diagnosis not present

## 2019-05-11 DIAGNOSIS — G20 Parkinson's disease: Secondary | ICD-10-CM | POA: Diagnosis not present

## 2019-05-14 DIAGNOSIS — M47892 Other spondylosis, cervical region: Secondary | ICD-10-CM | POA: Diagnosis not present

## 2019-05-14 DIAGNOSIS — G20 Parkinson's disease: Secondary | ICD-10-CM | POA: Diagnosis not present

## 2019-05-14 DIAGNOSIS — G8929 Other chronic pain: Secondary | ICD-10-CM | POA: Diagnosis not present

## 2019-05-14 DIAGNOSIS — J439 Emphysema, unspecified: Secondary | ICD-10-CM | POA: Diagnosis not present

## 2019-05-14 DIAGNOSIS — I4510 Unspecified right bundle-branch block: Secondary | ICD-10-CM | POA: Diagnosis not present

## 2019-05-14 DIAGNOSIS — E785 Hyperlipidemia, unspecified: Secondary | ICD-10-CM | POA: Diagnosis not present

## 2019-05-14 DIAGNOSIS — I700 Atherosclerosis of aorta: Secondary | ICD-10-CM | POA: Diagnosis not present

## 2019-05-14 DIAGNOSIS — M19042 Primary osteoarthritis, left hand: Secondary | ICD-10-CM | POA: Diagnosis not present

## 2019-05-14 DIAGNOSIS — M19041 Primary osteoarthritis, right hand: Secondary | ICD-10-CM | POA: Diagnosis not present

## 2019-05-14 DIAGNOSIS — I5030 Unspecified diastolic (congestive) heart failure: Secondary | ICD-10-CM | POA: Diagnosis not present

## 2019-05-18 DIAGNOSIS — M19042 Primary osteoarthritis, left hand: Secondary | ICD-10-CM | POA: Diagnosis not present

## 2019-05-18 DIAGNOSIS — I4510 Unspecified right bundle-branch block: Secondary | ICD-10-CM | POA: Diagnosis not present

## 2019-05-18 DIAGNOSIS — I700 Atherosclerosis of aorta: Secondary | ICD-10-CM | POA: Diagnosis not present

## 2019-05-18 DIAGNOSIS — J439 Emphysema, unspecified: Secondary | ICD-10-CM | POA: Diagnosis not present

## 2019-05-18 DIAGNOSIS — E785 Hyperlipidemia, unspecified: Secondary | ICD-10-CM | POA: Diagnosis not present

## 2019-05-18 DIAGNOSIS — M19041 Primary osteoarthritis, right hand: Secondary | ICD-10-CM | POA: Diagnosis not present

## 2019-05-18 DIAGNOSIS — M47892 Other spondylosis, cervical region: Secondary | ICD-10-CM | POA: Diagnosis not present

## 2019-05-18 DIAGNOSIS — G8929 Other chronic pain: Secondary | ICD-10-CM | POA: Diagnosis not present

## 2019-05-18 DIAGNOSIS — I5030 Unspecified diastolic (congestive) heart failure: Secondary | ICD-10-CM | POA: Diagnosis not present

## 2019-05-18 DIAGNOSIS — G20 Parkinson's disease: Secondary | ICD-10-CM | POA: Diagnosis not present

## 2019-05-22 DIAGNOSIS — J439 Emphysema, unspecified: Secondary | ICD-10-CM | POA: Diagnosis not present

## 2019-05-22 DIAGNOSIS — M19041 Primary osteoarthritis, right hand: Secondary | ICD-10-CM | POA: Diagnosis not present

## 2019-05-22 DIAGNOSIS — E785 Hyperlipidemia, unspecified: Secondary | ICD-10-CM | POA: Diagnosis not present

## 2019-05-22 DIAGNOSIS — G8929 Other chronic pain: Secondary | ICD-10-CM | POA: Diagnosis not present

## 2019-05-22 DIAGNOSIS — I4510 Unspecified right bundle-branch block: Secondary | ICD-10-CM | POA: Diagnosis not present

## 2019-05-22 DIAGNOSIS — M47892 Other spondylosis, cervical region: Secondary | ICD-10-CM | POA: Diagnosis not present

## 2019-05-22 DIAGNOSIS — I5030 Unspecified diastolic (congestive) heart failure: Secondary | ICD-10-CM | POA: Diagnosis not present

## 2019-05-22 DIAGNOSIS — G20 Parkinson's disease: Secondary | ICD-10-CM | POA: Diagnosis not present

## 2019-05-22 DIAGNOSIS — I700 Atherosclerosis of aorta: Secondary | ICD-10-CM | POA: Diagnosis not present

## 2019-05-22 DIAGNOSIS — M19042 Primary osteoarthritis, left hand: Secondary | ICD-10-CM | POA: Diagnosis not present

## 2019-05-25 DIAGNOSIS — G8929 Other chronic pain: Secondary | ICD-10-CM | POA: Diagnosis not present

## 2019-05-25 DIAGNOSIS — I5030 Unspecified diastolic (congestive) heart failure: Secondary | ICD-10-CM | POA: Diagnosis not present

## 2019-05-25 DIAGNOSIS — M47892 Other spondylosis, cervical region: Secondary | ICD-10-CM | POA: Diagnosis not present

## 2019-05-25 DIAGNOSIS — I700 Atherosclerosis of aorta: Secondary | ICD-10-CM | POA: Diagnosis not present

## 2019-05-25 DIAGNOSIS — J439 Emphysema, unspecified: Secondary | ICD-10-CM | POA: Diagnosis not present

## 2019-05-25 DIAGNOSIS — E785 Hyperlipidemia, unspecified: Secondary | ICD-10-CM | POA: Diagnosis not present

## 2019-05-25 DIAGNOSIS — I4510 Unspecified right bundle-branch block: Secondary | ICD-10-CM | POA: Diagnosis not present

## 2019-05-25 DIAGNOSIS — G20 Parkinson's disease: Secondary | ICD-10-CM | POA: Diagnosis not present

## 2019-05-25 DIAGNOSIS — M19041 Primary osteoarthritis, right hand: Secondary | ICD-10-CM | POA: Diagnosis not present

## 2019-05-25 DIAGNOSIS — M19042 Primary osteoarthritis, left hand: Secondary | ICD-10-CM | POA: Diagnosis not present

## 2019-05-26 DIAGNOSIS — R339 Retention of urine, unspecified: Secondary | ICD-10-CM | POA: Diagnosis not present

## 2019-05-28 DIAGNOSIS — E785 Hyperlipidemia, unspecified: Secondary | ICD-10-CM | POA: Diagnosis not present

## 2019-05-28 DIAGNOSIS — I4510 Unspecified right bundle-branch block: Secondary | ICD-10-CM | POA: Diagnosis not present

## 2019-05-28 DIAGNOSIS — M19042 Primary osteoarthritis, left hand: Secondary | ICD-10-CM | POA: Diagnosis not present

## 2019-05-28 DIAGNOSIS — M19041 Primary osteoarthritis, right hand: Secondary | ICD-10-CM | POA: Diagnosis not present

## 2019-05-28 DIAGNOSIS — J439 Emphysema, unspecified: Secondary | ICD-10-CM | POA: Diagnosis not present

## 2019-05-28 DIAGNOSIS — M47892 Other spondylosis, cervical region: Secondary | ICD-10-CM | POA: Diagnosis not present

## 2019-05-28 DIAGNOSIS — G8929 Other chronic pain: Secondary | ICD-10-CM | POA: Diagnosis not present

## 2019-05-28 DIAGNOSIS — I700 Atherosclerosis of aorta: Secondary | ICD-10-CM | POA: Diagnosis not present

## 2019-05-28 DIAGNOSIS — I5030 Unspecified diastolic (congestive) heart failure: Secondary | ICD-10-CM | POA: Diagnosis not present

## 2019-05-28 DIAGNOSIS — G20 Parkinson's disease: Secondary | ICD-10-CM | POA: Diagnosis not present

## 2019-06-01 DIAGNOSIS — I5030 Unspecified diastolic (congestive) heart failure: Secondary | ICD-10-CM | POA: Diagnosis not present

## 2019-06-01 DIAGNOSIS — I4510 Unspecified right bundle-branch block: Secondary | ICD-10-CM | POA: Diagnosis not present

## 2019-06-01 DIAGNOSIS — E785 Hyperlipidemia, unspecified: Secondary | ICD-10-CM | POA: Diagnosis not present

## 2019-06-01 DIAGNOSIS — J439 Emphysema, unspecified: Secondary | ICD-10-CM | POA: Diagnosis not present

## 2019-06-01 DIAGNOSIS — G8929 Other chronic pain: Secondary | ICD-10-CM | POA: Diagnosis not present

## 2019-06-01 DIAGNOSIS — G20 Parkinson's disease: Secondary | ICD-10-CM | POA: Diagnosis not present

## 2019-06-01 DIAGNOSIS — M19041 Primary osteoarthritis, right hand: Secondary | ICD-10-CM | POA: Diagnosis not present

## 2019-06-01 DIAGNOSIS — M47892 Other spondylosis, cervical region: Secondary | ICD-10-CM | POA: Diagnosis not present

## 2019-06-01 DIAGNOSIS — M19042 Primary osteoarthritis, left hand: Secondary | ICD-10-CM | POA: Diagnosis not present

## 2019-06-01 DIAGNOSIS — I700 Atherosclerosis of aorta: Secondary | ICD-10-CM | POA: Diagnosis not present

## 2019-06-07 ENCOUNTER — Other Ambulatory Visit: Payer: Self-pay | Admitting: Family Medicine

## 2019-06-09 ENCOUNTER — Emergency Department (HOSPITAL_COMMUNITY): Payer: Medicare Other

## 2019-06-09 ENCOUNTER — Emergency Department (HOSPITAL_COMMUNITY)
Admission: EM | Admit: 2019-06-09 | Discharge: 2019-06-09 | Disposition: A | Discharge status: Home / Self Care | Payer: Medicare Other | Source: Home / Self Care | Attending: Emergency Medicine | Admitting: Emergency Medicine

## 2019-06-09 ENCOUNTER — Encounter: Payer: Self-pay | Admitting: Family Medicine

## 2019-06-09 ENCOUNTER — Other Ambulatory Visit: Payer: Self-pay

## 2019-06-09 DIAGNOSIS — Y9301 Activity, walking, marching and hiking: Secondary | ICD-10-CM | POA: Insufficient documentation

## 2019-06-09 DIAGNOSIS — M1A9XX0 Chronic gout, unspecified, without tophus (tophi): Secondary | ICD-10-CM | POA: Diagnosis not present

## 2019-06-09 DIAGNOSIS — Z66 Do not resuscitate: Secondary | ICD-10-CM | POA: Diagnosis not present

## 2019-06-09 DIAGNOSIS — R1312 Dysphagia, oropharyngeal phase: Secondary | ICD-10-CM | POA: Diagnosis not present

## 2019-06-09 DIAGNOSIS — S79912A Unspecified injury of left hip, initial encounter: Secondary | ICD-10-CM | POA: Diagnosis not present

## 2019-06-09 DIAGNOSIS — S299XXA Unspecified injury of thorax, initial encounter: Secondary | ICD-10-CM | POA: Diagnosis not present

## 2019-06-09 DIAGNOSIS — G629 Polyneuropathy, unspecified: Secondary | ICD-10-CM | POA: Diagnosis not present

## 2019-06-09 DIAGNOSIS — W19XXXA Unspecified fall, initial encounter: Secondary | ICD-10-CM | POA: Insufficient documentation

## 2019-06-09 DIAGNOSIS — R509 Fever, unspecified: Secondary | ICD-10-CM | POA: Diagnosis not present

## 2019-06-09 DIAGNOSIS — Z79899 Other long term (current) drug therapy: Secondary | ICD-10-CM | POA: Insufficient documentation

## 2019-06-09 DIAGNOSIS — J439 Emphysema, unspecified: Secondary | ICD-10-CM | POA: Diagnosis not present

## 2019-06-09 DIAGNOSIS — G20 Parkinson's disease: Secondary | ICD-10-CM | POA: Diagnosis not present

## 2019-06-09 DIAGNOSIS — Z743 Need for continuous supervision: Secondary | ICD-10-CM | POA: Diagnosis not present

## 2019-06-09 DIAGNOSIS — I700 Atherosclerosis of aorta: Secondary | ICD-10-CM | POA: Diagnosis not present

## 2019-06-09 DIAGNOSIS — Z87891 Personal history of nicotine dependence: Secondary | ICD-10-CM | POA: Insufficient documentation

## 2019-06-09 DIAGNOSIS — E785 Hyperlipidemia, unspecified: Secondary | ICD-10-CM | POA: Diagnosis not present

## 2019-06-09 DIAGNOSIS — Y92009 Unspecified place in unspecified non-institutional (private) residence as the place of occurrence of the external cause: Secondary | ICD-10-CM | POA: Insufficient documentation

## 2019-06-09 DIAGNOSIS — M19042 Primary osteoarthritis, left hand: Secondary | ICD-10-CM | POA: Diagnosis not present

## 2019-06-09 DIAGNOSIS — S86912A Strain of unspecified muscle(s) and tendon(s) at lower leg level, left leg, initial encounter: Secondary | ICD-10-CM

## 2019-06-09 DIAGNOSIS — J449 Chronic obstructive pulmonary disease, unspecified: Secondary | ICD-10-CM | POA: Insufficient documentation

## 2019-06-09 DIAGNOSIS — K219 Gastro-esophageal reflux disease without esophagitis: Secondary | ICD-10-CM | POA: Diagnosis not present

## 2019-06-09 DIAGNOSIS — R296 Repeated falls: Secondary | ICD-10-CM | POA: Diagnosis not present

## 2019-06-09 DIAGNOSIS — M545 Low back pain, unspecified: Secondary | ICD-10-CM

## 2019-06-09 DIAGNOSIS — S86911A Strain of unspecified muscle(s) and tendon(s) at lower leg level, right leg, initial encounter: Secondary | ICD-10-CM | POA: Diagnosis not present

## 2019-06-09 DIAGNOSIS — N200 Calculus of kidney: Secondary | ICD-10-CM | POA: Diagnosis not present

## 2019-06-09 DIAGNOSIS — N319 Neuromuscular dysfunction of bladder, unspecified: Secondary | ICD-10-CM

## 2019-06-09 DIAGNOSIS — R05 Cough: Secondary | ICD-10-CM | POA: Diagnosis not present

## 2019-06-09 DIAGNOSIS — G8929 Other chronic pain: Secondary | ICD-10-CM

## 2019-06-09 DIAGNOSIS — M19041 Primary osteoarthritis, right hand: Secondary | ICD-10-CM | POA: Diagnosis not present

## 2019-06-09 DIAGNOSIS — I469 Cardiac arrest, cause unspecified: Secondary | ICD-10-CM | POA: Diagnosis not present

## 2019-06-09 DIAGNOSIS — S8992XA Unspecified injury of left lower leg, initial encounter: Secondary | ICD-10-CM | POA: Diagnosis not present

## 2019-06-09 DIAGNOSIS — M25562 Pain in left knee: Secondary | ICD-10-CM | POA: Diagnosis not present

## 2019-06-09 DIAGNOSIS — R279 Unspecified lack of coordination: Secondary | ICD-10-CM | POA: Diagnosis not present

## 2019-06-09 DIAGNOSIS — J189 Pneumonia, unspecified organism: Secondary | ICD-10-CM | POA: Diagnosis not present

## 2019-06-09 DIAGNOSIS — M25551 Pain in right hip: Secondary | ICD-10-CM | POA: Insufficient documentation

## 2019-06-09 DIAGNOSIS — I5032 Chronic diastolic (congestive) heart failure: Secondary | ICD-10-CM | POA: Insufficient documentation

## 2019-06-09 DIAGNOSIS — R262 Difficulty in walking, not elsewhere classified: Secondary | ICD-10-CM | POA: Insufficient documentation

## 2019-06-09 DIAGNOSIS — Z981 Arthrodesis status: Secondary | ICD-10-CM | POA: Diagnosis not present

## 2019-06-09 DIAGNOSIS — R52 Pain, unspecified: Secondary | ICD-10-CM | POA: Diagnosis not present

## 2019-06-09 DIAGNOSIS — J9601 Acute respiratory failure with hypoxia: Secondary | ICD-10-CM | POA: Diagnosis not present

## 2019-06-09 DIAGNOSIS — Z9049 Acquired absence of other specified parts of digestive tract: Secondary | ICD-10-CM | POA: Diagnosis not present

## 2019-06-09 DIAGNOSIS — R2689 Other abnormalities of gait and mobility: Secondary | ICD-10-CM | POA: Diagnosis not present

## 2019-06-09 DIAGNOSIS — Y999 Unspecified external cause status: Secondary | ICD-10-CM | POA: Insufficient documentation

## 2019-06-09 LAB — URINALYSIS, ROUTINE W REFLEX MICROSCOPIC
Bilirubin Urine: NEGATIVE
Glucose, UA: NEGATIVE mg/dL
Hgb urine dipstick: NEGATIVE
Ketones, ur: 5 mg/dL — AB
Nitrite: NEGATIVE
Protein, ur: NEGATIVE mg/dL
Specific Gravity, Urine: 1.015 (ref 1.005–1.030)
pH: 5 (ref 5.0–8.0)

## 2019-06-09 LAB — COMPREHENSIVE METABOLIC PANEL
ALT: 17 U/L (ref 0–44)
AST: 28 U/L (ref 15–41)
Albumin: 3.3 g/dL — ABNORMAL LOW (ref 3.5–5.0)
Alkaline Phosphatase: 77 U/L (ref 38–126)
Anion gap: 9 (ref 5–15)
BUN: 18 mg/dL (ref 8–23)
CO2: 24 mmol/L (ref 22–32)
Calcium: 8.6 mg/dL — ABNORMAL LOW (ref 8.9–10.3)
Chloride: 106 mmol/L (ref 98–111)
Creatinine, Ser: 0.73 mg/dL (ref 0.61–1.24)
GFR calc Af Amer: >60 mL/min (ref >60)
GFR calc non Af Amer: >60 mL/min (ref >60)
Glucose, Bld: 102 mg/dL — ABNORMAL HIGH (ref 70–99)
Potassium: 4.3 mmol/L (ref 3.5–5.1)
Sodium: 139 mmol/L (ref 135–145)
Total Bilirubin: 0.6 mg/dL (ref 0.3–1.2)
Total Protein: 6.9 g/dL (ref 6.5–8.1)

## 2019-06-09 LAB — CBC WITH DIFFERENTIAL/PLATELET
Abs Immature Granulocytes: 0.03 10*3/uL (ref 0.00–0.07)
Basophils Absolute: 0 10*3/uL (ref 0.0–0.1)
Basophils Relative: 1 %
Eosinophils Absolute: 0.2 10*3/uL (ref 0.0–0.5)
Eosinophils Relative: 3 %
HCT: 37.7 % — ABNORMAL LOW (ref 39.0–52.0)
Hemoglobin: 12.2 g/dL — ABNORMAL LOW (ref 13.0–17.0)
Immature Granulocytes: 0 %
Lymphocytes Relative: 18 %
Lymphs Abs: 1.5 10*3/uL (ref 0.7–4.0)
MCH: 30.9 pg (ref 26.0–34.0)
MCHC: 32.4 g/dL (ref 30.0–36.0)
MCV: 95.4 fL (ref 80.0–100.0)
Monocytes Absolute: 0.7 10*3/uL (ref 0.1–1.0)
Monocytes Relative: 8 %
Neutro Abs: 5.8 10*3/uL (ref 1.7–7.7)
Neutrophils Relative %: 70 %
Platelets: 243 10*3/uL (ref 150–400)
RBC: 3.95 MIL/uL — ABNORMAL LOW (ref 4.22–5.81)
RDW: 13.2 % (ref 11.5–15.5)
WBC: 8.3 10*3/uL (ref 4.0–10.5)
nRBC: 0 % (ref 0.0–0.2)

## 2019-06-09 NOTE — ED Notes (Signed)
Barrier cream applied to pts groin area

## 2019-06-09 NOTE — ED Notes (Signed)
Pt verbalized understanding of DC instructions. PTAR transport home.

## 2019-06-09 NOTE — ED Notes (Addendum)
PTAR called paperwork at bedside. {er [place] MD pt can go home with Cath and sself remove. Catheter instructions and syringe provided to pt to self remove at home. Pt verbalizes understanding of all instructions.

## 2019-06-09 NOTE — ED Notes (Signed)
PTAR at bedside. Pts contact aware he is coming home. Person confirmed someone is at house to let them in

## 2019-06-09 NOTE — ED Triage Notes (Signed)
Patient BIB [facility], patient has been having more falls at home and wife concerned about this. Wife told EMS that patient has chronic back pain and left hip pain, wife stated to EMS that she would like for EMS to bring patient to ED to get Xrays so she doesn't have to run patient all around town next week for Xrays. Patient denies pain. No LOC during fall. Patient was sitting in chair and slid out of chair. Did not hit head.

## 2019-06-09 NOTE — ED Provider Notes (Signed)
[facility]-EMERGENCY DEPT Provider Note   CSN: [id number] Arrival date & time: 06/09/19  1457     History Chief Complaint  Patient presents with  . Fall    Evan [name] is a 79 y.o. male.  Pt presents to the ED today with right hip, back, and left knee pain.  Pt is nonambulatory from several back surgeries.  He has been having back and right hip pain before the fall.  Pt said he got tired of waiting for his wife to get back from the grocery store and tried to walk.  Pt fell.  He hurt his left knee when he fell.  Pt said it does not hurt unless he tries to put weight on it.  He did not hit his head.  No loc.  No blood thinners.        Past Medical History:  Diagnosis Date  . Abnormality of gait    multiple cervical spondylosis  . Anxiety   . Aortic atherosclerosis (HCC)   . Bilateral leg weakness    due to back surgeries--  mostly uses wheelchair and at home very short distant w/ walker  . Bilateral lower extremity edema   . BPH (benign prostatic hyperplasia)   . Cervical spondylosis without myelopathy    per dr cram (neuro surg.)  . Chronic back pain   . Chronic bronchitis (HCC)   . Chronic gout    08-02-2017  per pt gout stable ,  last bout beginning March 2019  . Depression   . Diastolic CHF (HCC)   . Diverticulosis of colon   . Emphysema/COPD [facility]) pulmologist-  dr Chilton Greathouse   last acute exacerbation 07-12-2017 (documented in epic)  . Full dentures   . GERD (gastroesophageal reflux disease)   . Hard of hearing    does not wear hearing aides  . Hemorrhoids   . History of sepsis    07-15-2017  urosepsis secondary to kidney stone extraction--  completed 14d IV vancomyocin   . Hyperlipidemia   . Nephrolithiasis    per CT 07-15-2017 multiple non-obstructive renal stones  . Numbness    hands --- related to neck  . OA (osteoarthritis)    hands  . Pressure ulcer of sacral region, stage 1    per pcp note in epic dated 07-29-2017   . RBBB (right bundle branch block)   . Right ureteral stone   . Self-catheterizes urinary bladder     Patient Active Problem List   Diagnosis Date Noted  . Aortic atherosclerosis (HCC)   . Epidermal cyst 01/16/2018  . Hearing loss of right ear 01/16/2018  . Pressure injury of right buttock, stage 2 (HCC) 08/08/2017  . Pressure ulcer of sacral region, stage 1 07/29/2017  . Sepsis (HCC) 07/15/2017  . Ureteropelvic junction (UPJ) obstruction   . Mixed simple and mucopurulent chronic bronchitis (HCC)   . Chronic bronchitis with emphysema (HCC) 05/31/2017  . Bilateral hearing loss 04/25/2017  . Parkinsonism (HCC) 09/03/2016  . Right leg weakness 09/03/2016  . Diverticulosis of colon with hemorrhage   . UTI (urinary tract infection) 12/09/2015  . Rectal bleed 12/07/2015  . Chronic diastolic CHF (congestive heart failure) (HCC) 12/07/2015  . Rectal bleeding 12/07/2015  . Abnormality of gait 10/29/2015  . 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 Weakness of both legs 07/27/2015  . Shortness of breath on exertion 07/24/2015  . Tremor 07/24/2015  . Acute stress reaction 07/24/2015  . Screening, ischemic heart disease 01/29/2015  .  Hernia of abdominal cavity 12/20/2014  . Gastroesophageal reflux disease without esophagitis 09/03/2014  . Depression with anxiety 09/03/2014  . BPH (benign prostatic hyperplasia) 09/03/2014  . Gout, chronic 09/03/2014  . Spondylolisthesis at L4-L5 level 07/22/2014  . Diastolic dysfunction with heart failure (HCC) 01/01/2014  . Peripheral neuropathy 01/01/2014  . DOE (dyspnea on exertion) 11/15/2013  . Leg edema 11/15/2013  . Snoring 11/15/2013  . Pulmonary emphysema (HCC) 05/10/2013    Past Surgical History:  Procedure Laterality Date  . ABDOMINAL HERNIA REPAIR  1970's  . ANTERIOR CERVICAL DECOMP/DISCECTOMY FUSION  07/03/2001   C4 -- C5/  post op Exploration and evacuation cervical hematoma  . BLEPHAROPLASTY  2008  . CARDIOVASCULAR  STRESS TEST  05/27/1998   normal nuclear study w/ no ischemia/  normal LV function and wall motion , ef 64%  . CARPAL TUNNEL RELEASE Left 2003 approx.  . CERVICAL FUSION  1995   C5 -- C6  . COLONOSCOPY WITH PROPOFOL N/A 02/10/2016   Procedure: COLONOSCOPY WITH PROPOFOL;  Surgeon: Hilarie Fredrickson, MD;  Location: [facility];  Service: Endoscopy;  Laterality: N/A;  . CYSTO/  LEFT RETROGRADE PYELOGRAM/  BALLOON DILATION LEFT URETER/  URETEROSCOPY/  STENT PLACEMENT  07/28/2000  . CYSTOSCOPY W/ URETERAL STENT PLACEMENT Right 07/15/2017   Procedure: CYSTOSCOPY WITH RETROGRADE PYELOGRAM/URETERAL STENT PLACEMENT;  Surgeon: Ihor Gully, MD;  Location: [facility];  Service: Urology;  Laterality: Right;  . CYSTOSCOPY/URETEROSCOPY/HOLMIUM LASER/STENT PLACEMENT Right 08/10/2017   Procedure: CYSTOSCOPY/URETEROSCOPY/HOLMIUM LASER/STENT PLACEMENT;  Surgeon: Rene Paci, MD;  Location: [facility];  Service: Urology;  Laterality: Right;  ONLY NEEDS 45 MIN FOR PROCEDURE  . ESOPHAGOGASTRODUODENOSCOPY    . EXPLORATION AND RE-DO CERVICAL FUSION C4--5 AND ANTERIOR CERVIAL DISKECTOMY FUSION C3--4  08/09/2007   POST-OP EXPLORATION AND EVACUATION RETROPHARYNGEAL HEMATOMA  . LAPAROSCOPIC CHOLECYSTECTOMY  1990'S  . LUMBAR LAMINECTOMY/DECOMPRESSION MICRODISCECTOMY  10/29/2011   Procedure: LUMBAR LAMINECTOMY/DECOMPRESSION MICRODISCECTOMY 1 LEVEL;  Surgeon: Mariam Dollar, MD;  Location: [facility];  Service: Neurosurgery;  Laterality: Right;  Right Lumbar Three-Four Lumbar Laminectomy/Microdiscectomy  . MAXIMUM ACCESS (MAS)POSTERIOR LUMBAR INTERBODY FUSION (PLIF) 1 LEVEL N/A 07/22/2014   Procedure: FOR MAXIMUM ACCESS (MAS) POSTERIOR LUMBAR INTERBODY FUSION (PLIF) 1 LEVEL;  Surgeon: Donalee Citrin, MD;  Location: [facility];  Service: Neurosurgery;  Laterality: N/A;  FOR MAXIMUM ACCESS (MAS) POSTERIOR LUMBAR INTERBODY FUSION (PLIF) 1 LEVEL L4-5  . NEUROPLASTY / TRANSPOSITION ULNAR NERVE AT ELBOW Left  07/23/2008  . POSTERIOR CERVICAL FUSION/FORAMINOTOMY  06/09/2011   Procedure: POSTERIOR CERVICAL FUSION/FORAMINOTOMY LEVEL 4;  Surgeon: Mariam Dollar, MD;  Location: [facility];  Service: Neurosurgery;  Laterality: N/A;  Cervical three to seven  posterior cervical fusion, Cervical four to seven Laminectomy, bone graft from right iliac crest   . POSTERIOR LUMBAR LAMINECTOMY AND DISKECTOMY  10/15/2009   L3 -- L4  . STENT REMOVAL  08/10/2017   Procedure: STENT REMOVAL;  Surgeon: Rene Paci, MD;  Location: [facility];  Service: Urology;;  . TONSILLECTOMY AND ADENOIDECTOMY  child  . TRANSTHORACIC ECHOCARDIOGRAM  11/27/2013   grade 1 diastolic dysfunction, ef 50-55%/  trivial AR and PR/ mild MR and TR/  PASP  . TRANSURETHRAL RESECTION OF PROSTATE N/A 05/31/2016   Procedure: TRANSURETHRAL RESECTION OF THE PROSTATE (TURP);  Surgeon: Jethro Bolus, MD;  Location: [facility];  Service: Urology;  Laterality: N/A;       Family History  Problem Relation Age of Onset  . Diabetes Mother   .  Parkinson's disease Father   . Heart disease Father     Social History   Tobacco Use  . Smoking status: Former Smoker    Packs/day: 1.50    Years: 55.00    Pack years: 82.50    Types: Cigarettes    Quit date: 05/27/2013    Years since quitting: 6.0  . Smokeless tobacco: Former Neurosurgeon    Types: Chew    Quit date: 02/01/2017  Substance Use Topics  . Alcohol use: No    Alcohol/week: 0.0 standard drinks  . Drug use: No    Home Medications Prior to Admission medications   Medication Sig Start Date End Date Taking? Authorizing Provider  albuterol (PROVENTIL HFA;VENTOLIN HFA) 108 (90 Base) MCG/ACT inhaler Inhale 2 puffs into the lungs every 6 (six) hours as needed for wheezing or shortness of breath. 04/28/17  Yes Copland, Gwenlyn Found, MD  allopurinol (ZYLOPRIM) 100 MG tablet Take 1 tablet (100 mg total) by mouth daily. 03/06/19  Yes Sharlene Dory, DO  atorvastatin (LIPITOR) 20 MG tablet Take 1 tablet (20 mg total) by mouth daily. 03/06/19  Yes Wendling, Jilda Roche, DO  BREO ELLIPTA 200-25 MCG/INH AEPB INHALE 1 PUFF INTO THE LUNGS DAILY Patient taking differently: Inhale 1 puff into the lungs daily.  06/07/19  Yes Wendling, Jilda Roche, DO  colchicine 0.6 MG tablet TAKE 1 TABLET BY MOUTH EVERY 2 HOURS UNTIL RELIEF as needed 07/14/16  Yes Sharlene Dory, DO  FLOVENT HFA 110 MCG/ACT inhaler INHALE 2 PUFFS INTO THE LUNGS TWICE DAILY 12/18/18  Yes Sharlene Dory, DO  furosemide (LASIX) 20 MG tablet TAKE 2 TABLETS BY MOUTH DAILY 04/10/19  Yes Sharlene Dory, DO  ipratropium-albuterol (DUONEB) 0.5-2.5 (3) MG/3ML SOLN Take 3 mLs by nebulization every 6 (six) hours as needed (wheezing/shortness of breath). 12/27/18  Yes Sharlene Dory, DO  magnesium hydroxide (MILK OF MAGNESIA) 800 MG/5ML suspension Take by mouth daily as needed for constipation.   Yes [provider]  Multiple Vitamin (MULTIVITAMIN WITH MINERALS) TABS tablet Take 1 tablet by mouth daily.   Yes [provider]  niacin 500 MG tablet Take 500 mg by mouth daily.    Yes [provider]  omeprazole (PRILOSEC) 20 MG capsule TAKE 1 CAPSULE BY MOUTH DAILY IN THE MORNING Patient taking differently: Take 20 mg by mouth every morning.  03/05/19  Yes Wendling, Jilda Roche, DO  Polyethyl Glycol-Propyl Glycol (SYSTANE OP) Place 2 drops into both eyes at bedtime as needed (For dry eyes.).    Yes [provider]  potassium chloride (KLOR-CON) 10 MEQ tablet TAKE 2 TABLETS BY MOUTH DAILY 03/05/19  Yes Sharlene Dory, DO  Probiotic Product (PROBIOTIC PO) Take 1 capsule by mouth every morning.    Yes [provider]  sertraline (ZOLOFT) 50 MG tablet Take 1 tablet (50 mg total) by mouth daily. 03/06/19  Yes Sharlene Dory, DO    Allergies    Hydromorphone and Oxycodone  Review of Systems     Review of Systems  Musculoskeletal: Positive for back pain.       Left knee pain Right hip pain   All other systems reviewed and are negative.   Physical Exam Updated Vital Signs BP (!) 150/74   Pulse 68   Temp 98 F (36.7 C) (Oral)   Resp 18   Ht 5\' 11"  (1.803 m)   Wt 77.1 kg   SpO2 97%   BMI 23.71 kg/m   Physical Exam  Vitals and nursing note reviewed.  Constitutional:      Appearance: Normal appearance.  HENT:     Head: Normocephalic and atraumatic.     Right Ear: External ear normal.     Left Ear: External ear normal.     Nose: Nose normal.     Mouth/Throat:     Mouth: Mucous membranes are moist.     Pharynx: Oropharynx is clear.  Eyes:     Extraocular Movements: Extraocular movements intact.     Conjunctiva/sclera: Conjunctivae normal.     Pupils: Pupils are equal, round, and reactive to light.  Cardiovascular:     Rate and Rhythm: Normal rate and regular rhythm.     Pulses: Normal pulses.     Heart sounds: Normal heart sounds.  Pulmonary:     Effort: Pulmonary effort is normal.     Breath sounds: Normal breath sounds.  Abdominal:     General: Abdomen is flat. Bowel sounds are normal.     Palpations: Abdomen is soft.  Musculoskeletal:     Cervical back: Normal range of motion and neck supple.     Lumbar back: Tenderness present.       Back:       Legs:  Skin:    General: Skin is warm.     Capillary Refill: Capillary refill takes less than 2 seconds.  Neurological:     Mental Status: He is alert and oriented to person, place, and time.     Comments: Weakness BLE (chronic)  Psychiatric:        Mood and Affect: Mood normal.        Behavior: Behavior normal.     ED Results / Procedures / Treatments   Labs (all labs ordered are listed, but only abnormal results are displayed) Labs Reviewed  COMPREHENSIVE METABOLIC PANEL - Abnormal; Notable for the following components:      Result Value   Glucose, Bld 102 (*)    Calcium 8.6 (*)    Albumin 3.3  (*)    All other components within normal limits  CBC WITH DIFFERENTIAL/PLATELET - Abnormal; Notable for the following components:   RBC 3.95 (*)    Hemoglobin 12.2 (*)    HCT 37.7 (*)    All other components within normal limits  URINALYSIS, ROUTINE W REFLEX MICROSCOPIC - Abnormal; Notable for the following components:   Ketones, ur 5 (*)    Leukocytes,Ua MODERATE (*)    Bacteria, UA RARE (*)    All other components within normal limits  URINE CULTURE    EKG None  Radiology DG Chest 2 View  Result Date: 06/09/2019 CLINICAL DATA:  Fall with pain EXAM: CHEST - 2 VIEW COMPARISON:  Chest radiograph dated 06/04/2018. FINDINGS: The heart size is within normal limits. Vascular calcifications are seen in the aortic arch. The lungs are mildly hyperinflated, likely reflecting emphysema. Both lungs are clear. Fixation hardware is seen in the lower cervical spine. IMPRESSION: No active cardiopulmonary disease. Electronically Signed   By: Zerita Boers M.D.   On: 06/09/2019 16:17   DG Lumbar Spine Complete  Result Date: 06/09/2019 CLINICAL DATA:  79 year old male with fall and back pain. EXAM: LUMBAR SPINE - COMPLETE 4+ VIEW COMPARISON:  Lumbar spine radiograph dated 12/10/2014. FINDINGS: Five lumbar type vertebra. No definite acute fracture identified. Evaluation for fracture however is very limited due to advanced osteopenia. L4-L5 posterior fusion and disc spacer noted. There is multilevel disc space narrowing most prominent at T12-L1. Multilevel facet arthropathy.  There is advanced atherosclerotic calcification of the abdominal aorta. The aorta is ectatic measuring up to 2.6 cm in diameter. Right upper quadrant cholecystectomy clips. IMPRESSION: 1. No definite acute fracture or subluxation. Evaluation however is very limited due to advanced osteopenia. 2. Multilevel degenerative changes with lower lumbar fusion. 3. Advanced Aortic Atherosclerosis (ICD10-I70.0). Electronically Signed   By: Elgie Collard M.D.   On: 06/09/2019 16:10   DG Knee Complete 4 Views Left  Result Date: 06/09/2019 CLINICAL DATA:  Fall with knee pain EXAM: LEFT KNEE - COMPLETE 4+ VIEW COMPARISON:  None. FINDINGS: No evidence of fracture, dislocation, or joint effusion. Mild patellofemoral joint osteoarthritis is noted. There is diffuse osseous demineralization. Soft tissues are unremarkable. IMPRESSION: No acute osseous abnormality. Electronically Signed   By: Romona Curls M.D.   On: 06/09/2019 16:11   CT Renal Stone Study  Result Date: 06/09/2019 CLINICAL DATA:  Flank pain. EXAM: CT ABDOMEN AND PELVIS WITHOUT CONTRAST TECHNIQUE: Multidetector CT imaging of the abdomen and pelvis was performed following the standard protocol without IV contrast. COMPARISON:  CT abdomen pelvis dated 07/15/2017. FINDINGS: Lower chest: Mild bibasilar atelectasis is noted. Hepatobiliary: No focal liver abnormality is seen. Status post cholecystectomy. No biliary dilatation. Pancreas: There is fatty atrophy of the pancreas. Spleen: Normal in size without focal abnormality. Adrenals/Urinary Tract: Adrenal glands are unremarkable. Bilateral nonobstructive renal calculi measure up to 9 mm on the right and 5 mm on the left. The distal aspect of the right ureter near the ureterovesical junction is focally dilated which may reflect changes from prior obstruction in this location. No finding is identified at the ureterovesical junction to suggest ongoing obstruction. Left renal cysts measure up to 4.2 cm. There is no hydronephrosis on the left. The bladder is unremarkable. Stomach/Bowel: Stomach is within normal limits. Appendix appears normal. There is colonic diverticulosis without evidence of diverticulitis. No evidence of bowel wall thickening, distention, or inflammatory changes. Vascular/Lymphatic: Aortic atherosclerosis. No enlarged abdominal or pelvic lymph nodes. Reproductive: Prostate is unremarkable. Other: No abdominal wall hernia or  abnormality. No abdominopelvic ascites. Musculoskeletal: No acute osseous findings. Fixation hardware is seen at L4-L5. IMPRESSION: 1. Focal dilatation of the distal right ureter near the ureterovesical junction may reflect changes from prior obstruction in this location. No finding is identified to suggest ongoing obstruction. 2. Bilateral nonobstructive renal calculi. No hydronephrosis on either side. Aortic Atherosclerosis (ICD10-I70.0). Electronically Signed   By: Romona Curls M.D.   On: 06/09/2019 16:42   DG HIPS BILAT WITH PELVIS MIN 5 VIEWS  Result Date: 06/09/2019 CLINICAL DATA:  Fall with left hip pain EXAM: DG HIP (WITH OR WITHOUT PELVIS) 5+V BILAT COMPARISON:  None. FINDINGS: There is no evidence of hip fracture or dislocation. There is diffuse osseous demineralization. Mild degenerative changes are seen in both hips. Fixation hardware is seen in the lower lumbar spine. IMPRESSION: No acute fracture or dislocation. Electronically Signed   By: Romona Curls M.D.   On: 06/09/2019 16:12    Procedures Procedures (including critical care time)  Medications Ordered in ED Medications - No data to display  ED Course  I have reviewed the triage vital signs and the nursing notes.  Pertinent labs & imaging results that were available during my care of the patient were reviewed by me and considered in my medical decision making (see chart for details).    MDM Rules/Calculators/A&P  Pt does not have any fx.  He will be placed in a knee immobilizer to use if he is transferring.  Pt self-caths and requested a foley to be placed while he is here.  He does not want us to take it out prior to d/c.  He said he'd take it out himself when he gets home.  He will be instructed on how to do so and will be given a syringe.    CT renal does not look to have anything new.  Pt's urine sent for cx.  Pt has been on abx for a UTI, but does not know which one.  Pt's wife requested home  health, so I ordered a SW consult and filled out a face to face form.  Pt knows to return if worse. Final Clinical Impression(s) / ED Diagnoses Final diagnoses:  Fall, initial encounter  Strain of left knee, initial encounter  Intermittent self-catheterization of bladder  Chronic low back pain without sciatica, unspecified back pain laterality    Rx / DC Orders ED Discharge Orders    None       Jacalyn LefevreHaviland, Ric Rosenberg, MD 06/09/19 [phone]

## 2019-06-09 NOTE — ED Notes (Signed)
Pt transported to Xray. 

## 2019-06-11 ENCOUNTER — Emergency Department (HOSPITAL_COMMUNITY): Payer: Medicare Other

## 2019-06-11 ENCOUNTER — Inpatient Hospital Stay (HOSPITAL_COMMUNITY)
Admission: EM | Admit: 2019-06-11 | Discharge: 2019-06-13 | DRG: 193 | Disposition: A | Discharge status: Skilled Nursing Facility | Payer: Medicare Other | Attending: Internal Medicine | Admitting: Internal Medicine

## 2019-06-11 ENCOUNTER — Other Ambulatory Visit: Payer: Self-pay

## 2019-06-11 ENCOUNTER — Encounter: Payer: Self-pay | Admitting: Family Medicine

## 2019-06-11 ENCOUNTER — Telehealth: Payer: Self-pay | Admitting: Family Medicine

## 2019-06-11 ENCOUNTER — Ambulatory Visit (INDEPENDENT_AMBULATORY_CARE_PROVIDER_SITE_OTHER): Payer: Medicare Other | Admitting: Family Medicine

## 2019-06-11 ENCOUNTER — Encounter (HOSPITAL_COMMUNITY): Payer: Self-pay

## 2019-06-11 DIAGNOSIS — J439 Emphysema, unspecified: Secondary | ICD-10-CM | POA: Diagnosis present

## 2019-06-11 DIAGNOSIS — M1A9XX0 Chronic gout, unspecified, without tophus (tophi): Secondary | ICD-10-CM | POA: Diagnosis present

## 2019-06-11 DIAGNOSIS — G8929 Other chronic pain: Secondary | ICD-10-CM | POA: Diagnosis present

## 2019-06-11 DIAGNOSIS — F329 Major depressive disorder, single episode, unspecified: Secondary | ICD-10-CM | POA: Diagnosis present

## 2019-06-11 DIAGNOSIS — G629 Polyneuropathy, unspecified: Secondary | ICD-10-CM

## 2019-06-11 DIAGNOSIS — M19042 Primary osteoarthritis, left hand: Secondary | ICD-10-CM | POA: Diagnosis present

## 2019-06-11 DIAGNOSIS — R69 Illness, unspecified: Secondary | ICD-10-CM

## 2019-06-11 DIAGNOSIS — Z9079 Acquired absence of other genital organ(s): Secondary | ICD-10-CM

## 2019-06-11 DIAGNOSIS — R488 Other symbolic dysfunctions: Secondary | ICD-10-CM | POA: Diagnosis not present

## 2019-06-11 DIAGNOSIS — R05 Cough: Secondary | ICD-10-CM | POA: Diagnosis not present

## 2019-06-11 DIAGNOSIS — E785 Hyperlipidemia, unspecified: Secondary | ICD-10-CM | POA: Diagnosis present

## 2019-06-11 DIAGNOSIS — R0602 Shortness of breath: Secondary | ICD-10-CM | POA: Diagnosis not present

## 2019-06-11 DIAGNOSIS — Z820 Family history of epilepsy and other diseases of the nervous system: Secondary | ICD-10-CM

## 2019-06-11 DIAGNOSIS — Z9049 Acquired absence of other specified parts of digestive tract: Secondary | ICD-10-CM

## 2019-06-11 DIAGNOSIS — I5030 Unspecified diastolic (congestive) heart failure: Secondary | ICD-10-CM | POA: Diagnosis present

## 2019-06-11 DIAGNOSIS — F418 Other specified anxiety disorders: Secondary | ICD-10-CM | POA: Diagnosis present

## 2019-06-11 DIAGNOSIS — J9601 Acute respiratory failure with hypoxia: Secondary | ICD-10-CM | POA: Diagnosis not present

## 2019-06-11 DIAGNOSIS — R262 Difficulty in walking, not elsewhere classified: Secondary | ICD-10-CM | POA: Diagnosis not present

## 2019-06-11 DIAGNOSIS — F419 Anxiety disorder, unspecified: Secondary | ICD-10-CM | POA: Diagnosis present

## 2019-06-11 DIAGNOSIS — J449 Chronic obstructive pulmonary disease, unspecified: Secondary | ICD-10-CM | POA: Diagnosis not present

## 2019-06-11 DIAGNOSIS — Z981 Arthrodesis status: Secondary | ICD-10-CM

## 2019-06-11 DIAGNOSIS — M6281 Muscle weakness (generalized): Secondary | ICD-10-CM | POA: Diagnosis not present

## 2019-06-11 DIAGNOSIS — Z743 Need for continuous supervision: Secondary | ICD-10-CM | POA: Diagnosis not present

## 2019-06-11 DIAGNOSIS — Z66 Do not resuscitate: Secondary | ICD-10-CM | POA: Diagnosis present

## 2019-06-11 DIAGNOSIS — Z885 Allergy status to narcotic agent status: Secondary | ICD-10-CM

## 2019-06-11 DIAGNOSIS — R296 Repeated falls: Secondary | ICD-10-CM | POA: Diagnosis present

## 2019-06-11 DIAGNOSIS — Z8249 Family history of ischemic heart disease and other diseases of the circulatory system: Secondary | ICD-10-CM

## 2019-06-11 DIAGNOSIS — Z9181 History of falling: Secondary | ICD-10-CM

## 2019-06-11 DIAGNOSIS — M25562 Pain in left knee: Secondary | ICD-10-CM | POA: Diagnosis present

## 2019-06-11 DIAGNOSIS — G6289 Other specified polyneuropathies: Secondary | ICD-10-CM | POA: Diagnosis not present

## 2019-06-11 DIAGNOSIS — R5381 Other malaise: Secondary | ICD-10-CM | POA: Diagnosis not present

## 2019-06-11 DIAGNOSIS — Z9114 Patient's other noncompliance with medication regimen: Secondary | ICD-10-CM

## 2019-06-11 DIAGNOSIS — M109 Gout, unspecified: Secondary | ICD-10-CM | POA: Diagnosis not present

## 2019-06-11 DIAGNOSIS — K219 Gastro-esophageal reflux disease without esophagitis: Secondary | ICD-10-CM | POA: Diagnosis present

## 2019-06-11 DIAGNOSIS — M19041 Primary osteoarthritis, right hand: Secondary | ICD-10-CM | POA: Diagnosis present

## 2019-06-11 DIAGNOSIS — G20 Parkinson's disease: Secondary | ICD-10-CM | POA: Diagnosis present

## 2019-06-11 DIAGNOSIS — N400 Enlarged prostate without lower urinary tract symptoms: Secondary | ICD-10-CM | POA: Diagnosis present

## 2019-06-11 DIAGNOSIS — Z87891 Personal history of nicotine dependence: Secondary | ICD-10-CM

## 2019-06-11 DIAGNOSIS — J441 Chronic obstructive pulmonary disease with (acute) exacerbation: Secondary | ICD-10-CM

## 2019-06-11 DIAGNOSIS — R29898 Other symptoms and signs involving the musculoskeletal system: Secondary | ICD-10-CM | POA: Diagnosis not present

## 2019-06-11 DIAGNOSIS — R1312 Dysphagia, oropharyngeal phase: Secondary | ICD-10-CM | POA: Diagnosis not present

## 2019-06-11 DIAGNOSIS — M2550 Pain in unspecified joint: Secondary | ICD-10-CM | POA: Diagnosis not present

## 2019-06-11 DIAGNOSIS — J189 Pneumonia, unspecified organism: Secondary | ICD-10-CM | POA: Diagnosis not present

## 2019-06-11 DIAGNOSIS — I700 Atherosclerosis of aorta: Secondary | ICD-10-CM | POA: Diagnosis present

## 2019-06-11 DIAGNOSIS — R269 Unspecified abnormalities of gait and mobility: Secondary | ICD-10-CM | POA: Diagnosis not present

## 2019-06-11 DIAGNOSIS — G9009 Other idiopathic peripheral autonomic neuropathy: Secondary | ICD-10-CM | POA: Diagnosis not present

## 2019-06-11 DIAGNOSIS — Z79899 Other long term (current) drug therapy: Secondary | ICD-10-CM

## 2019-06-11 DIAGNOSIS — M545 Low back pain: Secondary | ICD-10-CM | POA: Diagnosis present

## 2019-06-11 DIAGNOSIS — N319 Neuromuscular dysfunction of bladder, unspecified: Secondary | ICD-10-CM | POA: Diagnosis present

## 2019-06-11 DIAGNOSIS — I5032 Chronic diastolic (congestive) heart failure: Secondary | ICD-10-CM | POA: Diagnosis present

## 2019-06-11 DIAGNOSIS — R1310 Dysphagia, unspecified: Secondary | ICD-10-CM

## 2019-06-11 DIAGNOSIS — Z8619 Personal history of other infectious and parasitic diseases: Secondary | ICD-10-CM

## 2019-06-11 DIAGNOSIS — R0902 Hypoxemia: Secondary | ICD-10-CM | POA: Diagnosis not present

## 2019-06-11 DIAGNOSIS — W19XXXA Unspecified fall, initial encounter: Secondary | ICD-10-CM | POA: Diagnosis not present

## 2019-06-11 DIAGNOSIS — Z7401 Bed confinement status: Secondary | ICD-10-CM | POA: Diagnosis not present

## 2019-06-11 DIAGNOSIS — Z9089 Acquired absence of other organs: Secondary | ICD-10-CM

## 2019-06-11 DIAGNOSIS — Z833 Family history of diabetes mellitus: Secondary | ICD-10-CM

## 2019-06-11 DIAGNOSIS — Z20822 Contact with and (suspected) exposure to covid-19: Secondary | ICD-10-CM | POA: Diagnosis present

## 2019-06-11 DIAGNOSIS — G20C Parkinsonism, unspecified: Secondary | ICD-10-CM | POA: Diagnosis present

## 2019-06-11 DIAGNOSIS — I959 Hypotension, unspecified: Secondary | ICD-10-CM | POA: Diagnosis not present

## 2019-06-11 DIAGNOSIS — R509 Fever, unspecified: Secondary | ICD-10-CM | POA: Diagnosis not present

## 2019-06-11 LAB — COMPREHENSIVE METABOLIC PANEL
ALT: 17 U/L (ref 0–44)
AST: 22 U/L (ref 15–41)
Albumin: 3.6 g/dL (ref 3.5–5.0)
Alkaline Phosphatase: 87 U/L (ref 38–126)
Anion gap: 9 (ref 5–15)
BUN: 18 mg/dL (ref 8–23)
CO2: 26 mmol/L (ref 22–32)
Calcium: 8.9 mg/dL (ref 8.9–10.3)
Chloride: 105 mmol/L (ref 98–111)
Creatinine, Ser: 0.78 mg/dL (ref 0.61–1.24)
GFR calc Af Amer: >60 mL/min (ref >60)
GFR calc non Af Amer: >60 mL/min (ref >60)
Glucose, Bld: 113 mg/dL — ABNORMAL HIGH (ref 70–99)
Potassium: 4.1 mmol/L (ref 3.5–5.1)
Sodium: 140 mmol/L (ref 135–145)
Total Bilirubin: 0.9 mg/dL (ref 0.3–1.2)
Total Protein: 7.3 g/dL (ref 6.5–8.1)

## 2019-06-11 LAB — LACTIC ACID, PLASMA: Lactic Acid, Venous: 1.3 mmol/L (ref 0.5–1.9)

## 2019-06-11 LAB — RESPIRATORY PANEL BY RT PCR (FLU A&B, COVID)
Influenza A by PCR: NEGATIVE
Influenza B by PCR: NEGATIVE
SARS Coronavirus 2 by RT PCR: NEGATIVE

## 2019-06-11 LAB — CBC WITH DIFFERENTIAL/PLATELET
Abs Immature Granulocytes: 0.07 10*3/uL (ref 0.00–0.07)
Basophils Absolute: 0 10*3/uL (ref 0.0–0.1)
Basophils Relative: 0 %
Eosinophils Absolute: 0.2 10*3/uL (ref 0.0–0.5)
Eosinophils Relative: 2 %
HCT: 41 % (ref 39.0–52.0)
Hemoglobin: 13 g/dL (ref 13.0–17.0)
Immature Granulocytes: 1 %
Lymphocytes Relative: 11 %
Lymphs Abs: 1.2 10*3/uL (ref 0.7–4.0)
MCH: 30.2 pg (ref 26.0–34.0)
MCHC: 31.7 g/dL (ref 30.0–36.0)
MCV: 95.3 fL (ref 80.0–100.0)
Monocytes Absolute: 1 10*3/uL (ref 0.1–1.0)
Monocytes Relative: 9 %
Neutro Abs: 8.8 10*3/uL — ABNORMAL HIGH (ref 1.7–7.7)
Neutrophils Relative %: 77 %
Platelets: 272 10*3/uL (ref 150–400)
RBC: 4.3 MIL/uL (ref 4.22–5.81)
RDW: 13.3 % (ref 11.5–15.5)
WBC: 11.3 10*3/uL — ABNORMAL HIGH (ref 4.0–10.5)
nRBC: 0 % (ref 0.0–0.2)

## 2019-06-11 LAB — URINE CULTURE: Culture: <10000 — AB

## 2019-06-11 LAB — POC SARS CORONAVIRUS 2 AG -  ED: SARS Coronavirus 2 Ag: NEGATIVE

## 2019-06-11 LAB — LIPASE, BLOOD: Lipase: 13 U/L (ref 11–51)

## 2019-06-11 LAB — PROTIME-INR
INR: 1 (ref 0.8–1.2)
Prothrombin Time: 12.9 seconds (ref 11.4–15.2)

## 2019-06-11 LAB — TROPONIN I (HIGH SENSITIVITY)
Troponin I (High Sensitivity): 9 ng/L (ref <18)
Troponin I (High Sensitivity): 8 ng/L (ref <18)

## 2019-06-11 MED ORDER — IPRATROPIUM-ALBUTEROL 0.5-2.5 (3) MG/3ML IN SOLN: 3.0000 mL | Freq: Four times a day (QID) | RESPIRATORY_TRACT | Status: DC | PRN

## 2019-06-11 MED ORDER — SODIUM CHLORIDE 0.9 % IV SOLN: INTRAVENOUS | Status: DC

## 2019-06-11 MED ORDER — NIACIN 500 MG PO TABS
500.0000 mg | ORAL_TABLET | Freq: Every day | ORAL | Status: DC
  Administered 2019-06-12 – 2019-06-13 (×2): 500 mg via ORAL
  Filled 2019-06-11 (×2): qty 1

## 2019-06-11 MED ORDER — OMEGA-3-ACID ETHYL ESTERS 1 G PO CAPS
1.0000 g | ORAL_CAPSULE | Freq: Every day | ORAL | Status: DC
  Administered 2019-06-12 – 2019-06-13 (×2): 1 g via ORAL
  Filled 2019-06-11 (×2): qty 1

## 2019-06-11 MED ORDER — ASCORBIC ACID 500 MG PO TABS
1000.0000 mg | ORAL_TABLET | Freq: Every day | ORAL | Status: DC
  Administered 2019-06-12 – 2019-06-13 (×2): 1000 mg via ORAL
  Filled 2019-06-11 (×2): qty 2

## 2019-06-11 MED ORDER — PANTOPRAZOLE SODIUM 40 MG PO TBEC
40.0000 mg | DELAYED_RELEASE_TABLET | Freq: Every day | ORAL | Status: DC
  Administered 2019-06-12 – 2019-06-13 (×2): 40 mg via ORAL
  Filled 2019-06-11 (×2): qty 1

## 2019-06-11 MED ORDER — SERTRALINE HCL 25 MG PO TABS
25.0000 mg | ORAL_TABLET | Freq: Every day | ORAL | Status: DC
  Administered 2019-06-12 – 2019-06-13 (×2): 25 mg via ORAL
  Filled 2019-06-11 (×2): qty 1

## 2019-06-11 MED ORDER — FLUTICASONE FUROATE-VILANTEROL 200-25 MCG/INH IN AEPB
1.0000 | INHALATION_SPRAY | Freq: Every day | RESPIRATORY_TRACT | Status: DC
  Administered 2019-06-12 – 2019-06-13 (×2): 1 via RESPIRATORY_TRACT
  Filled 2019-06-11: qty 28

## 2019-06-11 MED ORDER — SODIUM CHLORIDE 0.9 % IV SOLN
1.0000 g | Freq: Once | INTRAVENOUS | Status: AC
  Administered 2019-06-11: 1 g via INTRAVENOUS
  Filled 2019-06-11: qty 10

## 2019-06-11 MED ORDER — SODIUM CHLORIDE 0.9 % IV SOLN: 500.0000 mg | INTRAVENOUS | Status: DC

## 2019-06-11 MED ORDER — DIAZEPAM 5 MG PO TABS: 2.5000 mg | ORAL_TABLET | Freq: Three times a day (TID) | ORAL | Status: DC | PRN

## 2019-06-11 MED ORDER — POLYVINYL ALCOHOL 1.4 % OP SOLN
1.0000 [drp] | Freq: Every evening | OPHTHALMIC | Status: DC | PRN
  Filled 2019-06-11: qty 15

## 2019-06-11 MED ORDER — ENOXAPARIN SODIUM 40 MG/0.4ML ~~LOC~~ SOLN
40.0000 mg | SUBCUTANEOUS | Status: DC
  Administered 2019-06-11 – 2019-06-13 (×2): 40 mg via SUBCUTANEOUS
  Filled 2019-06-11 (×2): qty 0.4

## 2019-06-11 MED ORDER — POLYETHYL GLYCOL-PROPYL GLYCOL 0.4-0.3 % OP GEL: Freq: Every evening | OPHTHALMIC | Status: DC | PRN

## 2019-06-11 MED ORDER — SODIUM CHLORIDE 0.9 % IV SOLN
500.0000 mg | Freq: Once | INTRAVENOUS | Status: AC
  Administered 2019-06-11: 500 mg via INTRAVENOUS
  Filled 2019-06-11: qty 500

## 2019-06-11 MED ORDER — ADULT MULTIVITAMIN W/MINERALS CH
1.0000 | ORAL_TABLET | Freq: Every day | ORAL | Status: DC
  Administered 2019-06-12 – 2019-06-13 (×2): 1 via ORAL
  Filled 2019-06-11 (×2): qty 1

## 2019-06-11 MED ORDER — ALLOPURINOL 100 MG PO TABS
100.0000 mg | ORAL_TABLET | Freq: Every day | ORAL | Status: DC
  Administered 2019-06-12 – 2019-06-13 (×2): 100 mg via ORAL
  Filled 2019-06-11 (×2): qty 1

## 2019-06-11 MED ORDER — ATORVASTATIN CALCIUM 20 MG PO TABS
20.0000 mg | ORAL_TABLET | Freq: Every day | ORAL | Status: DC
  Administered 2019-06-12 – 2019-06-13 (×2): 20 mg via ORAL
  Filled 2019-06-11 (×2): qty 1

## 2019-06-11 MED ORDER — SODIUM CHLORIDE 0.9 % IV SOLN
2.0000 g | INTRAVENOUS | Status: DC
  Administered 2019-06-12: 2 g via INTRAVENOUS
  Filled 2019-06-11: qty 2
  Filled 2019-06-11: qty 20

## 2019-06-11 NOTE — Telephone Encounter (Signed)
FYI

## 2019-06-11 NOTE — Telephone Encounter (Signed)
Per Junious Dresser @ [facility] patient canceled his physical therapy for last week . Due to pain .

## 2019-06-11 NOTE — H&P (Signed)
History and Physical    Evan [name] HMC:[id number] DOB: 1941/03/09 DOA: 06/11/2019  PCP: Sharlene Dory, DO Patient coming from: Home  Chief Complaint: Cough and fever  HPI: Evan [name] is a 79 y.o. male with medical history significant of COPD, parkinson disease, BPH, chronic diastolic heart failure, chronic back pain status post back surgeries. Patient unable to give a history as to why he has presented to the hospital; poor historian. His wife says he is unable to walk now and has had multiple falls. He started running a fever with some incoherent verbiage for the past few months and has worsened. He had a chronic cough secondary to COPD and non adherence with inhalers but has worsened recently with new sputum production. He had an elevated temperature of 99.9 F.   ED Course: Vitals: Temperature of 98.3 F, pulse of temperature of 83, respirations of 18, blood pressure 106 6/94, SPO2 of 99% on 2 L  Labs: WBC count of 11,300 Imaging: Chest x-ray significant for left basilar airspace disease Medications/Course: Ceftriaxone, azithromycin  Review of Systems: Review of Systems  Respiratory: Positive for cough, sputum production and shortness of breath.   Cardiovascular: Negative for chest pain.  Gastrointestinal: Negative for nausea and vomiting.  All other systems reviewed and are negative.   Past Medical History:  Diagnosis Date  . Abnormality of gait    multiple cervical spondylosis  . Anxiety   . Aortic atherosclerosis (HCC)   . Bilateral leg weakness    due to back surgeries--  mostly uses wheelchair and at home very short distant w/ walker  . Bilateral lower extremity edema   . BPH (benign prostatic hyperplasia)   . Cervical spondylosis without myelopathy    per dr cram (neuro surg.)  . Chronic back pain   . Chronic bronchitis (HCC)   . Chronic gout    08-02-2017  per pt gout stable ,  last bout beginning March 2019  . Depression   .  Diastolic CHF (HCC)   . Diverticulosis of colon   . Emphysema/COPD [facility]) pulmologist-  dr Chilton Greathouse   last acute exacerbation 07-12-2017 (documented in epic)  . Full dentures   . GERD (gastroesophageal reflux disease)   . Hard of hearing    does not wear hearing aides  . Hemorrhoids   . History of sepsis    07-15-2017  urosepsis secondary to kidney stone extraction--  completed 14d IV vancomyocin   . Hyperlipidemia   . Nephrolithiasis    per CT 07-15-2017 multiple non-obstructive renal stones  . Numbness    hands --- related to neck  . OA (osteoarthritis)    hands  . Pressure ulcer of sacral region, stage 1    per pcp note in epic dated 07-29-2017  . RBBB (right bundle branch block)   . Right ureteral stone   . Self-catheterizes urinary bladder     Past Surgical History:  Procedure Laterality Date  . ABDOMINAL HERNIA REPAIR  1970's  . ANTERIOR CERVICAL DECOMP/DISCECTOMY FUSION  07/03/2001   C4 -- C5/  post op Exploration and evacuation cervical hematoma  . BLEPHAROPLASTY  2008  . CARDIOVASCULAR STRESS TEST  05/27/1998   normal nuclear study w/ no ischemia/  normal LV function and wall motion , ef 64%  . CARPAL TUNNEL RELEASE Left 2003 approx.  . CERVICAL FUSION  1995   C5 -- C6  . COLONOSCOPY WITH PROPOFOL N/A 02/10/2016   Procedure: COLONOSCOPY WITH PROPOFOL;  Surgeon: Wilhemina Bonito  Marina Goodell, MD;  Location: [facility];  Service: Endoscopy;  Laterality: N/A;  . CYSTO/  LEFT RETROGRADE PYELOGRAM/  BALLOON DILATION LEFT URETER/  URETEROSCOPY/  STENT PLACEMENT  07/28/2000  . CYSTOSCOPY W/ URETERAL STENT PLACEMENT Right 07/15/2017   Procedure: CYSTOSCOPY WITH RETROGRADE PYELOGRAM/URETERAL STENT PLACEMENT;  Surgeon: Ihor Gully, MD;  Location: [facility];  Service: Urology;  Laterality: Right;  . CYSTOSCOPY/URETEROSCOPY/HOLMIUM LASER/STENT PLACEMENT Right 08/10/2017   Procedure: CYSTOSCOPY/URETEROSCOPY/HOLMIUM LASER/STENT PLACEMENT;  Surgeon: Rene Paci, MD;  Location:  [facility];  Service: Urology;  Laterality: Right;  ONLY NEEDS 45 MIN FOR PROCEDURE  . ESOPHAGOGASTRODUODENOSCOPY    . EXPLORATION AND RE-DO CERVICAL FUSION C4--5 AND ANTERIOR CERVIAL DISKECTOMY FUSION C3--4  08/09/2007   POST-OP EXPLORATION AND EVACUATION RETROPHARYNGEAL HEMATOMA  . LAPAROSCOPIC CHOLECYSTECTOMY  1990'S  . LUMBAR LAMINECTOMY/DECOMPRESSION MICRODISCECTOMY  10/29/2011   Procedure: LUMBAR LAMINECTOMY/DECOMPRESSION MICRODISCECTOMY 1 LEVEL;  Surgeon: Mariam Dollar, MD;  Location: [facility];  Service: Neurosurgery;  Laterality: Right;  Right Lumbar Three-Four Lumbar Laminectomy/Microdiscectomy  . MAXIMUM ACCESS (MAS)POSTERIOR LUMBAR INTERBODY FUSION (PLIF) 1 LEVEL N/A 07/22/2014   Procedure: FOR MAXIMUM ACCESS (MAS) POSTERIOR LUMBAR INTERBODY FUSION (PLIF) 1 LEVEL;  Surgeon: Donalee Citrin, MD;  Location: [facility];  Service: Neurosurgery;  Laterality: N/A;  FOR MAXIMUM ACCESS (MAS) POSTERIOR LUMBAR INTERBODY FUSION (PLIF) 1 LEVEL L4-5  . NEUROPLASTY / TRANSPOSITION ULNAR NERVE AT ELBOW Left 07/23/2008  . POSTERIOR CERVICAL FUSION/FORAMINOTOMY  06/09/2011   Procedure: POSTERIOR CERVICAL FUSION/FORAMINOTOMY LEVEL 4;  Surgeon: Mariam Dollar, MD;  Location: [facility];  Service: Neurosurgery;  Laterality: N/A;  Cervical three to seven  posterior cervical fusion, Cervical four to seven Laminectomy, bone graft from right iliac crest   . POSTERIOR LUMBAR LAMINECTOMY AND DISKECTOMY  10/15/2009   L3 -- L4  . STENT REMOVAL  08/10/2017   Procedure: STENT REMOVAL;  Surgeon: Rene Paci, MD;  Location: [facility];  Service: Urology;;  . TONSILLECTOMY AND ADENOIDECTOMY  child  . TRANSTHORACIC ECHOCARDIOGRAM  11/27/2013   grade 1 diastolic dysfunction, ef 50-55%/  trivial AR and PR/ mild MR and TR/  PASP  . TRANSURETHRAL RESECTION OF PROSTATE N/A 05/31/2016   Procedure: TRANSURETHRAL RESECTION OF THE PROSTATE (TURP);  Surgeon: Jethro Bolus, MD;   Location: [facility];  Service: Urology;  Laterality: N/A;     reports that he quit smoking about 6 years ago. His smoking use included cigarettes. He has a 82.50 pack-year smoking history. He quit smokeless tobacco use about 2 years ago.  His smokeless tobacco use included chew. He reports that he does not drink alcohol or use drugs.  Allergies  Allergen Reactions  . Hydromorphone Itching  . Oxycodone Itching and Nausea Only    Family History  Problem Relation Age of Onset  . Diabetes Mother   . Parkinson's disease Father   . Heart disease Father     Prior to Admission medications   Medication Sig Start Date End Date Taking? Authorizing Provider  acetaminophen (TYLENOL) 500 MG tablet Take 1,000 mg by mouth every 6 (six) hours as needed for mild pain or fever.   Yes [provider]  albuterol (PROVENTIL HFA;VENTOLIN HFA) 108 (90 Base) MCG/ACT inhaler Inhale 2 puffs into the lungs every 6 (six) hours as needed for wheezing or shortness of breath. 04/28/17  Yes Copland, Gwenlyn Found, MD  allopurinol (ZYLOPRIM) 100 MG tablet Take 1 tablet (100 mg total) by mouth daily. 03/06/19  Yes Wendling,  Jilda Roche, DO  ascorbic acid (VITAMIN C) 1000 MG tablet Take 1,000 mg by mouth daily.   Yes [provider]  aspirin 325 MG EC tablet Take 325 mg by mouth 3 (three) times daily as needed for pain.    Yes [provider]  atorvastatin (LIPITOR) 20 MG tablet Take 1 tablet (20 mg total) by mouth daily. 03/06/19  Yes Sharlene Dory, DO  AZO-CRANBERRY PO Take 1 tablet by mouth daily.   Yes [provider]  BREO ELLIPTA 200-25 MCG/INH AEPB INHALE 1 PUFF INTO THE LUNGS DAILY Patient taking differently: Inhale 1 puff into the lungs daily.  06/07/19  Yes Wendling, Jilda Roche, DO  colchicine 0.6 MG tablet TAKE 1 TABLET BY MOUTH EVERY 2 HOURS UNTIL RELIEF as needed Patient taking differently: Take 0.6 mg by mouth See admin instructions. TAKE 1 TABLET  BY MOUTH EVERY 2 HOURS UNTIL RELIEF as needed. For gout. 07/14/16  Yes Wendling, Jilda Roche, DO  diazepam (VALIUM) 10 MG tablet Take 2.5 mg by mouth every 8 (eight) hours as needed for anxiety.   Yes [provider]  FLOVENT HFA 110 MCG/ACT inhaler INHALE 2 PUFFS INTO THE LUNGS TWICE DAILY Patient taking differently: Inhale 2 puffs into the lungs 2 (two) times daily as needed (shortness of breath).  12/18/18  Yes Sharlene Dory, DO  furosemide (LASIX) 20 MG tablet TAKE 2 TABLETS BY MOUTH DAILY Patient taking differently: Take 40 mg by mouth daily as needed for fluid or edema.  04/10/19  Yes Sharlene Dory, DO  ipratropium-albuterol (DUONEB) 0.5-2.5 (3) MG/3ML SOLN Take 3 mLs by nebulization every 6 (six) hours as needed (wheezing/shortness of breath). 12/27/18  Yes Sharlene Dory, DO  magnesium hydroxide (MILK OF MAGNESIA) 800 MG/5ML suspension Take 30 mLs by mouth daily as needed for constipation.    Yes [provider]  Multiple Vitamin (MULTIVITAMIN WITH MINERALS) TABS tablet Take 1 tablet by mouth daily.   Yes [provider]  niacin 500 MG tablet Take 500 mg by mouth daily.    Yes [provider]  Omega-3 Fatty Acids (FISH OIL PO) Take 1 capsule by mouth daily.    Yes [provider]  omeprazole (PRILOSEC) 20 MG capsule TAKE 1 CAPSULE BY MOUTH DAILY IN THE MORNING Patient taking differently: Take 20 mg by mouth every morning.  03/05/19  Yes Wendling, Jilda Roche, DO  Polyethyl Glycol-Propyl Glycol (SYSTANE OP) Place 2 drops into both eyes at bedtime as needed (For dry eyes.).    Yes [provider]  potassium chloride (KLOR-CON) 10 MEQ tablet TAKE 2 TABLETS BY MOUTH DAILY Patient taking differently: Take 20 mEq by mouth daily as needed (Takes with every dose of furosemide).  03/05/19  Yes Sharlene Dory, DO  sertraline (ZOLOFT) 50 MG tablet Take 1 tablet (50 mg total) by mouth daily. Patient taking  differently: Take 25 mg by mouth daily.  03/06/19  Yes Sharlene Dory, DO  trimethoprim (TRIMPEX) 100 MG tablet Take 100 mg by mouth daily.   Yes [provider]    Physical Exam:  Physical Exam Constitutional:      General: He is not in acute distress.    Appearance: He is well-developed. He is not diaphoretic.  Eyes:     Conjunctiva/sclera: Conjunctivae normal.     Pupils: Pupils are equal, round, and reactive to light.  Cardiovascular:     Rate and Rhythm: Normal rate and regular rhythm.     Heart  sounds: Normal heart sounds. No murmur.  Pulmonary:     Effort: Pulmonary effort is normal. No respiratory distress.     Breath sounds: Transmitted upper airway sounds present. Examination of the left-lower field reveals decreased breath sounds. Decreased breath sounds present. No wheezing or rales.  Abdominal:     General: Bowel sounds are normal. There is no distension.     Palpations: Abdomen is soft.     Tenderness: There is no abdominal tenderness. There is no guarding or rebound.  Musculoskeletal:        General: No tenderness. Normal range of motion.     Cervical back: Normal range of motion.  Lymphadenopathy:     Cervical: No cervical adenopathy.  Skin:    General: Skin is warm and dry.  Neurological:     Mental Status: He is alert.     Motor: Weakness (2/5 strength of RLE and 3/5 strengh of LLE. UE strength is intact) present. No atrophy.     Comments: Oriented to person and place      Labs on Admission: I have personally reviewed following labs and imaging studies  CBC: Recent Labs  Lab 06/09/19 1624 06/11/19 1339  WBC 8.3 11.3*  NEUTROABS 5.8 8.8*  HGB 12.2* 13.0  HCT 37.7* 41.0  MCV 95.4 95.3  PLT 243 409    Basic Metabolic Panel: Recent Labs  Lab 06/09/19 1624 06/11/19 1339  NA 139 140  K 4.3 4.1  CL 106 105  CO2 24 26  GLUCOSE 102* 113*  BUN 18 18  CREATININE 0.73 0.78  CALCIUM 8.6* 8.9    GFR: Estimated Creatinine  Clearance: 81.1 mL/min (by C-G formula based on SCr of 0.78 mg/dL).  Liver Function Tests: Recent Labs  Lab 06/09/19 1624 06/11/19 1339  AST 28 22  ALT 17 17  ALKPHOS 77 87  BILITOT 0.6 0.9  PROT 6.9 7.3  ALBUMIN 3.3* 3.6   Recent Labs  Lab 06/11/19 1339  LIPASE 13   No results for input(s): AMMONIA in the last 168 hours.  Coagulation Profile: Recent Labs  Lab 06/11/19 1339  INR 1.0    Cardiac Enzymes: No results for input(s): CKTOTAL, CKMB, CKMBINDEX, TROPONINI in the last 168 hours.  BNP (last 3 results) No results for input(s): PROBNP in the last 8760 hours.  HbA1C: No results for input(s): HGBA1C in the last 72 hours.  CBG: No results for input(s): GLUCAP in the last 168 hours.  Lipid Profile: No results for input(s): CHOL, HDL, LDLCALC, TRIG, CHOLHDL, LDLDIRECT in the last 72 hours.  Thyroid Function Tests: No results for input(s): TSH, T4TOTAL, FREET4, T3FREE, THYROIDAB in the last 72 hours.  Anemia Panel: No results for input(s): VITAMINB12, FOLATE, FERRITIN, TIBC, IRON, RETICCTPCT in the last 72 hours.  Urine analysis:    Component Value Date/Time   COLORURINE YELLOW 06/09/2019 1712   APPEARANCEUR CLEAR 06/09/2019 1712   LABSPEC 1.015 06/09/2019 1712   PHURINE 5.0 06/09/2019 1712   GLUCOSEU NEGATIVE 06/09/2019 1712   GLUCOSEU NEGATIVE 01/21/2015 1437   HGBUR NEGATIVE 06/09/2019 1712   BILIRUBINUR NEGATIVE 06/09/2019 1712   BILIRUBINUR neg 03/08/2016 1702   KETONESUR 5 (A) 06/09/2019 1712   PROTEINUR NEGATIVE 06/09/2019 1712   UROBILINOGEN 0.2 03/08/2016 1702   UROBILINOGEN 0.2 01/21/2015 1437   NITRITE NEGATIVE 06/09/2019 1712   LEUKOCYTESUR MODERATE (A) 06/09/2019 1712     Radiological Exams on Admission: DG Chest Port 1 View  Result Date: 06/11/2019 CLINICAL DATA:  Cough, fever and back  pain. EXAM: PORTABLE CHEST 1 VIEW COMPARISON:  Single-view of the chest 06/09/2019. CT chest 01/11/2019. FINDINGS: The lungs are emphysematous. There  is new airspace disease in the periphery of the left lower lobe. Right lung is clear. Atherosclerosis is noted. Heart size is normal. No pneumothorax. IMPRESSION: New left basilar airspace disease could be due to atelectasis or pneumonia. Emphysema. Atherosclerosis. Electronically Signed   By: Drusilla Kanner M.D.   On: 06/11/2019 15:13    Assessment/Plan Active Problems:   Chronic diastolic CHF (congestive heart failure) (HCC)   Left lower lobe pneumonia  Left lower lobe pneumonia Patient does not meet sepsis criteria on admission. Associated leukocytosis. Patient empirically started on Ceftriaxone and Azithromycin. Patient has chronic coughing secondary to COPD but does mention some coughing with eating as well -Ceftriaxone/Azithromycin -Check urine strep ag and legionella -Blood cultures pending -Flutter valve -Aspiration precautions and SLP evaluation -NS IV fluids overnight  Weakness Progressive. Patient has started PT at home. Very weak in bilateral LE but good upper extremity strength. History of back surgeries which have resulted in weak limbs. -PT eval  COPD -Continue Breo and Duonebs  Chronic diastolic heart failure Patient is on Lasix as needed as an outpatient. Appears dry. Last Transthoracic Echocardiogram from 2015 significant for normal systolic dysfunction and diastolic dysfunction per Cardiology note. -Watch daily weights and in/out -Hold lasix since it is prn  Hyperlipidemia -Continue Lipitor  Anxiety Depression -Continue Valium and Zoloft  Bladder dysfunction Unknown etiology. Self-catheterizes QID. On trimethroprim -CIC q 6 hours -Hold trimethoprim while on current IV antibiotic treatment  Gout On allopurinol daily and colchicine prn. -Continue allopurinol   DVT prophylaxis: Lovenox Code Status: DNR Family Communication: Wife on telephone Disposition Plan: Medical floor Consults called: None Admission status: Inpatient   Jacquelin Hawking,  MD [facility] 06/11/2019, 5:52 PM

## 2019-06-11 NOTE — ED Triage Notes (Signed)
EMS reports from home, wife called for generalized neck and back pain. Hx of chronic pain. Pt denied pain during transport, seen for fall Friday. Hx of COPD and non compliance with meds.   BP 110/70 HR 86 RR 18 Sp02 92 2ltrs CBG 118

## 2019-06-11 NOTE — ED Notes (Signed)
ED Provider Pfeiffer notified Poc Covid result was Negative

## 2019-06-11 NOTE — Progress Notes (Signed)
[facility] [address] [place], [place] [address] [phone]  Date:  06/11/2019   Name:  Evan [name]   DOB:  [birth year]   MRN:  [id number]  PCP:  Evan Dory, DO    Chief Complaint: No chief complaint on file.   History of Present Illness:  Evan [name] is a 79 y.o. very pleasant male patient who presents with the following:  Here today for a virtual visit due to COVID-19 pandemic Patient location is home, provider location is office.  Patient identity confirmed with 2 factors, wife gives consent for virtual visit today.  Myself and [name] are present on the call today  I have seen this patient once in the past for a COPD exacerbation, otherwise he is a patient of my partner Dr. Carmelia Roller  He has history of UTI, emphysema, neuropathy, parkinsonism, CHF He is nonambulatory due to back operations and neck operations   He was seen in the ER 2 days ago after a fall-he hurt his knee, no fracture Urine culture was done-this showed insignificant growth He was discharged back to home from the ER-unfortunately he has gotten worse since that time  Today Evan [name] notes that he has "pain from his head to his toes," they are using Valium as needed for pain control and muscle spasm treatment He has a temperature of 99.2- this started yesterday. They are using tylenol to control fever His wife notes that he is often "non-coherent"-it is difficult for me to ascertain if this is a new issue, but wife states his confusion is worse than baseline. She also notes chest congestion, some cough.  She notes that he has an indwelling catheter at this time, he often gets urinary tract infection   Patient Active Problem List   Diagnosis Date Noted  . Aortic atherosclerosis (HCC)   . Epidermal cyst 01/16/2018  . Hearing loss of right ear 01/16/2018  . Pressure injury of right buttock, stage 2 (HCC)  08/08/2017  . Pressure ulcer of sacral region, stage 1 07/29/2017  . Sepsis (HCC) 07/15/2017  . Ureteropelvic junction (UPJ) obstruction   . Mixed simple and mucopurulent chronic bronchitis (HCC)   . Chronic bronchitis with emphysema (HCC) 05/31/2017  . Bilateral hearing loss 04/25/2017  . Parkinsonism (HCC) 09/03/2016  . Right leg weakness 09/03/2016  . Diverticulosis of colon with hemorrhage   . UTI (urinary tract infection) 12/09/2015  . Rectal bleed 12/07/2015  . Chronic diastolic CHF (congestive heart failure) (HCC) 12/07/2015  . Rectal bleeding 12/07/2015  . Abnormality of gait 10/29/2015  . 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 Weakness of both legs 07/27/2015  . Shortness of breath on exertion 07/24/2015  . Tremor 07/24/2015  . Acute stress reaction 07/24/2015  . Screening, ischemic heart disease 01/29/2015  . Hernia of abdominal cavity 12/20/2014  . Gastroesophageal reflux disease without esophagitis 09/03/2014  . Depression with anxiety 09/03/2014  . BPH (benign prostatic hyperplasia) 09/03/2014  . Gout, chronic 09/03/2014  . Spondylolisthesis at L4-L5 level 07/22/2014  . Diastolic dysfunction with heart failure (HCC) 01/01/2014  . Peripheral neuropathy 01/01/2014  . DOE (dyspnea on exertion) 11/15/2013  . Leg edema 11/15/2013  . Snoring 11/15/2013  . Pulmonary emphysema (HCC) 05/10/2013    Past Medical History:  Diagnosis Date  . Abnormality of gait    multiple cervical spondylosis  . Anxiety   . Aortic atherosclerosis (HCC)   . Bilateral leg  weakness    due to back surgeries--  mostly uses wheelchair and at home very short distant w/ walker  . Bilateral lower extremity edema   . BPH (benign prostatic hyperplasia)   . Cervical spondylosis without myelopathy    per dr cram (neuro surg.)  . Chronic back pain   . Chronic bronchitis (HCC)   . Chronic gout    08-02-2017  per pt gout stable ,  last bout beginning March 2019  . Depression   .  Diastolic CHF (HCC)   . Diverticulosis of colon   . Emphysema/COPD [facility]) pulmologist-  dr Chilton Greathouse   last acute exacerbation 07-12-2017 (documented in epic)  . Full dentures   . GERD (gastroesophageal reflux disease)   . Hard of hearing    does not wear hearing aides  . Hemorrhoids   . History of sepsis    07-15-2017  urosepsis secondary to kidney stone extraction--  completed 14d IV vancomyocin   . Hyperlipidemia   . Nephrolithiasis    per CT 07-15-2017 multiple non-obstructive renal stones  . Numbness    hands --- related to neck  . OA (osteoarthritis)    hands  . Pressure ulcer of sacral region, stage 1    per pcp note in epic dated 07-29-2017  . RBBB (right bundle branch block)   . Right ureteral stone   . Self-catheterizes urinary bladder     Past Surgical History:  Procedure Laterality Date  . ABDOMINAL HERNIA REPAIR  1970's  . ANTERIOR CERVICAL DECOMP/DISCECTOMY FUSION  07/03/2001   C4 -- C5/  post op Exploration and evacuation cervical hematoma  . BLEPHAROPLASTY  2008  . CARDIOVASCULAR STRESS TEST  05/27/1998   normal nuclear study w/ no ischemia/  normal LV function and wall motion , ef 64%  . CARPAL TUNNEL RELEASE Left 2003 approx.  . CERVICAL FUSION  1995   C5 -- C6  . COLONOSCOPY WITH PROPOFOL N/A 02/10/2016   Procedure: COLONOSCOPY WITH PROPOFOL;  Surgeon: Hilarie Fredrickson, MD;  Location: [facility];  Service: Endoscopy;  Laterality: N/A;  . CYSTO/  LEFT RETROGRADE PYELOGRAM/  BALLOON DILATION LEFT URETER/  URETEROSCOPY/  STENT PLACEMENT  07/28/2000  . CYSTOSCOPY W/ URETERAL STENT PLACEMENT Right 07/15/2017   Procedure: CYSTOSCOPY WITH RETROGRADE PYELOGRAM/URETERAL STENT PLACEMENT;  Surgeon: Ihor Gully, MD;  Location: [facility];  Service: Urology;  Laterality: Right;  . CYSTOSCOPY/URETEROSCOPY/HOLMIUM LASER/STENT PLACEMENT Right 08/10/2017   Procedure: CYSTOSCOPY/URETEROSCOPY/HOLMIUM LASER/STENT PLACEMENT;  Surgeon: Rene Paci, MD;  Location:  [facility];  Service: Urology;  Laterality: Right;  ONLY NEEDS 45 MIN FOR PROCEDURE  . ESOPHAGOGASTRODUODENOSCOPY    . EXPLORATION AND RE-DO CERVICAL FUSION C4--5 AND ANTERIOR CERVIAL DISKECTOMY FUSION C3--4  08/09/2007   POST-OP EXPLORATION AND EVACUATION RETROPHARYNGEAL HEMATOMA  . LAPAROSCOPIC CHOLECYSTECTOMY  1990'S  . LUMBAR LAMINECTOMY/DECOMPRESSION MICRODISCECTOMY  10/29/2011   Procedure: LUMBAR LAMINECTOMY/DECOMPRESSION MICRODISCECTOMY 1 LEVEL;  Surgeon: Mariam Dollar, MD;  Location: [facility];  Service: Neurosurgery;  Laterality: Right;  Right Lumbar Three-Four Lumbar Laminectomy/Microdiscectomy  . MAXIMUM ACCESS (MAS)POSTERIOR LUMBAR INTERBODY FUSION (PLIF) 1 LEVEL N/A 07/22/2014   Procedure: FOR MAXIMUM ACCESS (MAS) POSTERIOR LUMBAR INTERBODY FUSION (PLIF) 1 LEVEL;  Surgeon: Donalee Citrin, MD;  Location: [facility];  Service: Neurosurgery;  Laterality: N/A;  FOR MAXIMUM ACCESS (MAS) POSTERIOR LUMBAR INTERBODY FUSION (PLIF) 1 LEVEL L4-5  . NEUROPLASTY / TRANSPOSITION ULNAR NERVE AT ELBOW Left 07/23/2008  . POSTERIOR CERVICAL FUSION/FORAMINOTOMY  06/09/2011   Procedure: POSTERIOR CERVICAL  FUSION/FORAMINOTOMY LEVEL 4;  Surgeon: Elaina Hoops, MD;  Location: [place] [facility];  Service: Neurosurgery;  Laterality: N/A;  Cervical three to seven  posterior cervical fusion, Cervical four to seven Laminectomy, bone graft from right iliac crest   . POSTERIOR LUMBAR LAMINECTOMY AND DISKECTOMY  10/15/2009   L3 -- L4  . STENT REMOVAL  08/10/2017   Procedure: STENT REMOVAL;  Surgeon: Ceasar Mons, MD;  Location: [facility];  Service: Urology;;  . TONSILLECTOMY AND ADENOIDECTOMY  child  . TRANSTHORACIC ECHOCARDIOGRAM  11/27/2013   grade 1 diastolic dysfunction, ef 62-37%/  trivial AR and PR/ mild MR and TR/  PASP 75mmHg  . TRANSURETHRAL RESECTION OF PROSTATE N/A 05/31/2016   Procedure: TRANSURETHRAL RESECTION OF THE PROSTATE (TURP);  Surgeon: Carolan Clines, MD;   Location: [facility];  Service: Urology;  Laterality: N/A;    Social History   Tobacco Use  . Smoking status: Former Smoker    Packs/day: 1.50    Years: 55.00    Pack years: 82.50    Types: Cigarettes    Quit date: 05/27/2013    Years since quitting: 6.0  . Smokeless tobacco: Former Systems developer    Types: Chew    Quit date: 02/01/2017  Substance Use Topics  . Alcohol use: No    Alcohol/week: 0.0 standard drinks  . Drug use: No    Family History  Problem Relation Age of Onset  . Diabetes Mother   . Parkinson's disease Father   . Heart disease Father     Allergies  Allergen Reactions  . Hydromorphone Itching  . Oxycodone Itching and Nausea Only    Medication list has been reviewed and updated.  Current Outpatient Medications on File Prior to Visit  Medication Sig Dispense Refill  . albuterol (PROVENTIL HFA;VENTOLIN HFA) 108 (90 Base) MCG/ACT inhaler Inhale 2 puffs into the lungs every 6 (six) hours as needed for wheezing or shortness of breath. 1 Inhaler 2  . allopurinol (ZYLOPRIM) 100 MG tablet Take 1 tablet (100 mg total) by mouth daily. 90 tablet 2  . atorvastatin (LIPITOR) 20 MG tablet Take 1 tablet (20 mg total) by mouth daily. 90 tablet 2  . BREO ELLIPTA 200-25 MCG/INH AEPB INHALE 1 PUFF INTO THE LUNGS DAILY (Patient taking differently: Inhale 1 puff into the lungs daily. ) 60 each 2  . colchicine 0.6 MG tablet TAKE 1 TABLET BY MOUTH EVERY 2 HOURS UNTIL RELIEF as needed 30 tablet 0  . FLOVENT HFA 110 MCG/ACT inhaler INHALE 2 PUFFS INTO THE LUNGS TWICE DAILY 12 g 2  . furosemide (LASIX) 20 MG tablet TAKE 2 TABLETS BY MOUTH DAILY 60 tablet 1  . ipratropium-albuterol (DUONEB) 0.5-2.5 (3) MG/3ML SOLN Take 3 mLs by nebulization every 6 (six) hours as needed (wheezing/shortness of breath). 360 mL 0  . magnesium hydroxide (MILK OF MAGNESIA) 800 MG/5ML suspension Take by mouth daily as needed for constipation.    . Multiple Vitamin (MULTIVITAMIN WITH MINERALS) TABS  tablet Take 1 tablet by mouth daily.    . niacin 500 MG tablet Take 500 mg by mouth daily.     Marland Kitchen omeprazole (PRILOSEC) 20 MG capsule TAKE 1 CAPSULE BY MOUTH DAILY IN THE MORNING (Patient taking differently: Take 20 mg by mouth every morning. ) 90 capsule 1  . Polyethyl Glycol-Propyl Glycol (SYSTANE OP) Place 2 drops into both eyes at bedtime as needed (For dry eyes.).     Marland Kitchen potassium chloride (KLOR-CON) 10 MEQ tablet TAKE 2  TABLETS BY MOUTH DAILY 60 tablet 1  . Probiotic Product (PROBIOTIC PO) Take 1 capsule by mouth every morning.     . sertraline (ZOLOFT) 50 MG tablet Take 1 tablet (50 mg total) by mouth daily. 90 tablet 2   No current facility-administered medications on file prior to visit.    Review of Systems:  As per HPI- otherwise negative.   Physical Examination: There were no vitals filed for this visit. There were no vitals filed for this visit. There is no height or weight on file to calculate BMI. Ideal Body Weight:    Spoke with patient's wife on the telephone.  No video available She is not checking any vital signs except for temperature at this time  Assessment and Plan: Fever, unspecified fever cause  Debilitated patient  Phone consultation today with wife of this elderly and debilitated patient.  He is nonambulatory due to orthopedic issues, currently has an indwelling Foley catheter.  His wife notes he is more confused than baseline, he is running a low-grade fever.  She is unable to get him into the car herself due to his physical debility.  I advised her to call EMS and have him transferred to the emergency department for further evaluation.  He may require admission for current illness.  He may also need discharge to a care facility, because per patient wife they are not able to help him get out of bed even to go to the restroom at home any longer  Moderate medical decision making today Spoke with patient's wife for 7 minutes  Signed Abbe Amsterdam, MD

## 2019-06-11 NOTE — Progress Notes (Signed)
Called for report at 77 and RN was starting IV will wait on returned called.

## 2019-06-11 NOTE — TOC Initial Note (Addendum)
Transition of Care [facility]) - Initial/Assessment Note    Patient Details  Name: Evan [name] MRN: [id number] Date of Birth: [birth year]  Transition of Care [facility] CM/SW Contact:    Elliot Cousin, RN Phone Number: [phone]  06/11/2019, 12:26 PM  Clinical Narrative:                 TOC CM spoke to pt's wife, wife stated pt was on his way back to [facility] ED. States he is active with [facility] for HHPT. States he is running a fever and is very weak. Pt has wheelchair x2 and lift chair. States she is going to need additional assistance in the home with helping with bathing. Explained she will need to look into private duty caregiver to assist. States she believes she can cover hiring a caregiver 4 hours per day. Will continue to follow for dc needs. Contacted [facility] rep, Alfonse Flavors to make aware pt was in the ED. ED provider updated.   Received confirmation back from [facility] rep, Clydie Braun. They have added HHRN, OT, aide.   Expected Discharge Plan: Home w Home Health Services Barriers to Discharge: Continued Medical Work up   Patient Goals and CMS Choice Patient states their goals for this hospitalization and ongoing recovery are:: will need additional asssistance in the home CMS Medicare.gov Compare Post Acute Care list provided to:: Patient Represenative (must comment)(wife, Elpidio Anis) Choice offered to / list presented to : Spouse  Expected Discharge Plan and Services Expected Discharge Plan: Home w Home Health Services In-house Referral: Clinical Social Work Discharge Planning Services: CM Consult Post Acute Care Choice: Home Health Living arrangements for the past 2 months: Single Family Home                           HH Arranged: RN, PT, OT, Nurse's Aide, Social Work [employer] Agency: [facility] Date HH Agency Contacted: 06/11/19 Time HH Agency Contacted: 1221 Representative spoke with at [facility] Agency: Alfonse Flavors  Prior Living  Arrangements/Services Living arrangements for the past 2 months: Single Family Home Lives with:: Spouse Patient language and need for interpreter reviewed:: Yes Do you feel safe going back to the place where you live?: Yes      Need for Family Participation in Patient Care: Yes (Comment) Care giver support system in place?: Yes (comment) Current home services: DME, Home PT(wheelchair x2, lift chair) Criminal Activity/Legal Involvement Pertinent to Current Situation/Hospitalization: No - Comment as needed  Activities of Daily Living      Permission Sought/Granted Permission sought to share information with : Case Manager, PCP, Family Supports Permission granted to share information with : Yes, Verbal Permission Granted  Share Information with NAME: Jabri Blancett  Permission granted to share info w AGENCY: Home Health  Permission granted to share info w Relationship: wife  Permission granted to share info w Contact Information: [phone]  Emotional Assessment           Psych Involvement: No (comment)  Admission diagnosis:  fall/pain Patient Active Problem List   Diagnosis Date Noted  . Aortic atherosclerosis (HCC)   . Epidermal cyst 01/16/2018  . Hearing loss of right ear 01/16/2018  . Pressure injury of right buttock, stage 2 (HCC) 08/08/2017  . Pressure ulcer of sacral region, stage 1 07/29/2017  . Sepsis (HCC) 07/15/2017  . Ureteropelvic junction (UPJ) obstruction   . Mixed simple and mucopurulent chronic bronchitis (HCC)   .  Chronic bronchitis with emphysema ([place]) 05/31/2017  . Bilateral hearing loss 04/25/2017  . Parkinsonism ([place]) 09/03/2016  . Right leg weakness 09/03/2016  . Diverticulosis of colon with hemorrhage   . UTI (urinary tract infection) 12/09/2015  . Rectal bleed 12/07/2015  . Chronic diastolic CHF (congestive heart failure) ([place]) 12/07/2015  . Rectal bleeding 12/07/2015  . Abnormality of gait 10/29/2015  . Spondylosis, cervical, with  myelopathy 10/29/2015  . Weakness of both legs 07/27/2015  . Shortness of breath on exertion 07/24/2015  . Tremor 07/24/2015  . Acute stress reaction 07/24/2015  . Screening, ischemic heart disease 01/29/2015  . Hernia of abdominal cavity 12/20/2014  . Gastroesophageal reflux disease without esophagitis 09/03/2014  . Depression with anxiety 09/03/2014  . BPH (benign prostatic hyperplasia) 09/03/2014  . Gout, chronic 09/03/2014  . Spondylolisthesis at L4-L5 level 07/22/2014  . Diastolic dysfunction with heart failure ([place]) 01/01/2014  . Peripheral neuropathy 01/01/2014  . DOE (dyspnea on exertion) 11/15/2013  . Leg edema 11/15/2013  . Snoring 11/15/2013  . Pulmonary emphysema ([place]) 05/10/2013   PCP:  Shelda Pal, DO Pharmacy:   [place], [place] - [address] [place] [address] Phone: [phone] Fax: [phone]     Social Determinants of Health (SDOH) Interventions    Readmission Risk Interventions No flowsheet data found.

## 2019-06-11 NOTE — ED Provider Notes (Signed)
[facility] DEPT Provider Note   CSN: [id number] Arrival date & time: 06/11/19  1146     History Chief Complaint  Patient presents with  . Back Pain    Evan [name] is a 79 y.o. male.  HPI History is predominantly obtained from the patient's wife.  The patient reports he is not sure why he had to come to the emergency department.  He reports he has been sick and weak and apparently his wife called the doctor in the said to bring him to the emergency department.  The patient reports he has a lot of problems with chronic severe pain in his back due to old surgeries.  He reports it got somewhat worse after he fell 2 days ago.  He reports he can be reasonably comfortable in certain positions but he moves a certain way he gets a lot of pain again in his lower back and somewhat to the left.  Patient's wife reports that at baseline, the patient has very limited mobility.  Up until 6 days ago he was able to stand and shuffle a bit to assist in transfers.  Since then, he is gotten increasingly weak and is fallen 2 times.  She reports that yesterday and this morning he had, what for him is a low-grade fever.  She reports his normal temperature is 96 degrees.  Last night his temperature was 99.7, he felt very hot overnight.  This morning his temperature was 99.3.  She reports he has been somewhat more confused and disoriented.  She reports the last time this happened, he ended up having a UTI and developing sepsis.  She reports subsequent to that he came home with a PICC line.  That was about 2 years ago.  He has not been in any group living environment.  He has not been in a nursing home or assisted living.  A physical therapist comes to the home.  Patient has been cared for by his wife and son.    Past Medical History:  Diagnosis Date  . Abnormality of gait    multiple cervical spondylosis  . Anxiety   . Aortic atherosclerosis ([place])   . Bilateral leg  weakness    due to back surgeries--  mostly uses wheelchair and at home very short distant w/ walker  . Bilateral lower extremity edema   . BPH (benign prostatic hyperplasia)   . Cervical spondylosis without myelopathy    per dr cram (neuro surg.)  . Chronic back pain   . Chronic bronchitis ([place])   . Chronic gout    08-02-2017  per pt gout stable ,  last bout beginning March 2019  . Depression   . Diastolic CHF ([place])   . Diverticulosis of colon   . Emphysema/COPD [facility]) pulmologist-  dr Marshell Garfinkel   last acute exacerbation 07-12-2017 (documented in epic)  . Full dentures   . GERD (gastroesophageal reflux disease)   . Hard of hearing    does not wear hearing aides  . Hemorrhoids   . History of sepsis    07-15-2017  urosepsis secondary to kidney stone extraction--  completed 14d IV vancomyocin   . Hyperlipidemia   . Nephrolithiasis    per CT 07-15-2017 multiple non-obstructive renal stones  . Numbness    hands --- related to neck  . OA (osteoarthritis)    hands  . Pressure ulcer of sacral region, stage 1    per pcp note in epic dated 07-29-2017  .  RBBB (right bundle branch block)   . Right ureteral stone   . Self-catheterizes urinary bladder     Patient Active Problem List   Diagnosis Date Noted  . Aortic atherosclerosis (HCC)   . Epidermal cyst 01/16/2018  . Hearing loss of right ear 01/16/2018  . Pressure injury of right buttock, stage 2 (HCC) 08/08/2017  . Pressure ulcer of sacral region, stage 1 07/29/2017  . Sepsis (HCC) 07/15/2017  . Ureteropelvic junction (UPJ) obstruction   . Mixed simple and mucopurulent chronic bronchitis (HCC)   . Chronic bronchitis with emphysema (HCC) 05/31/2017  . Bilateral hearing loss 04/25/2017  . Parkinsonism (HCC) 09/03/2016  . Right leg weakness 09/03/2016  . Diverticulosis of colon with hemorrhage   . UTI (urinary tract infection) 12/09/2015  . Rectal bleed 12/07/2015  . Chronic diastolic CHF (congestive heart failure) (HCC)  12/07/2015  . Rectal bleeding 12/07/2015  . Abnormality of gait 10/29/2015  . 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 Weakness of both legs 07/27/2015  . Shortness of breath on exertion 07/24/2015  . Tremor 07/24/2015  . Acute stress reaction 07/24/2015  . Screening, ischemic heart disease 01/29/2015  . Hernia of abdominal cavity 12/20/2014  . Gastroesophageal reflux disease without esophagitis 09/03/2014  . Depression with anxiety 09/03/2014  . BPH (benign prostatic hyperplasia) 09/03/2014  . Gout, chronic 09/03/2014  . Spondylolisthesis at L4-L5 level 07/22/2014  . Diastolic dysfunction with heart failure (HCC) 01/01/2014  . Peripheral neuropathy 01/01/2014  . DOE (dyspnea on exertion) 11/15/2013  . Leg edema 11/15/2013  . Snoring 11/15/2013  . Pulmonary emphysema (HCC) 05/10/2013    Past Surgical History:  Procedure Laterality Date  . ABDOMINAL HERNIA REPAIR  1970's  . ANTERIOR CERVICAL DECOMP/DISCECTOMY FUSION  07/03/2001   C4 -- C5/  post op Exploration and evacuation cervical hematoma  . BLEPHAROPLASTY  2008  . CARDIOVASCULAR STRESS TEST  05/27/1998   normal nuclear study w/ no ischemia/  normal LV function and wall motion , ef 64%  . CARPAL TUNNEL RELEASE Left 2003 approx.  . CERVICAL FUSION  1995   C5 -- C6  . COLONOSCOPY WITH PROPOFOL N/A 02/10/2016   Procedure: COLONOSCOPY WITH PROPOFOL;  Surgeon: Hilarie Fredrickson, MD;  Location: [facility];  Service: Endoscopy;  Laterality: N/A;  . CYSTO/  LEFT RETROGRADE PYELOGRAM/  BALLOON DILATION LEFT URETER/  URETEROSCOPY/  STENT PLACEMENT  07/28/2000  . CYSTOSCOPY W/ URETERAL STENT PLACEMENT Right 07/15/2017   Procedure: CYSTOSCOPY WITH RETROGRADE PYELOGRAM/URETERAL STENT PLACEMENT;  Surgeon: Ihor Gully, MD;  Location: [facility];  Service: Urology;  Laterality: Right;  . CYSTOSCOPY/URETEROSCOPY/HOLMIUM LASER/STENT PLACEMENT Right 08/10/2017   Procedure: CYSTOSCOPY/URETEROSCOPY/HOLMIUM LASER/STENT PLACEMENT;  Surgeon:  Rene Paci, MD;  Location[facility];  Service: Urology;  Laterality: Right;  ONLY NEEDS 45 MIN FOR PROCEDURE  . ESOPHAGOGASTRODUODENOSCOPY    . EXPLORATION AND RE-DO CERVICAL FUSION C4--5 AND ANTERIOR CERVIAL DISKECTOMY FUSION C3--4  08/09/2007   POST-OP EXPLORATION AND EVACUATION RETROPHARYNGEAL HEMATOMA  . LAPAROSCOPIC CHOLECYSTECTOMY  1990'S  . LUMBAR LAMINECTOMY/DECOMPRESSION MICRODISCECTOMY  10/29/2011   Procedure: LUMBAR LAMINECTOMY/DECOMPRESSION MICRODISCECTOMY 1 LEVEL;  Surgeon: Mariam Dollar, MD;  Location: [facility];  Service: Neurosurgery;  Laterality: Right;  Right Lumbar Three-Four Lumbar Laminectomy/Microdiscectomy  . MAXIMUM ACCESS (MAS)POSTERIOR LUMBAR INTERBODY FUSION (PLIF) 1 LEVEL N/A 07/22/2014   Procedure: FOR MAXIMUM ACCESS (MAS) POSTERIOR LUMBAR INTERBODY FUSION (PLIF) 1 LEVEL;  Surgeon: Donalee Citrin, MD;  Location: [facility];  Service: Neurosurgery;  Laterality: N/A;  FOR MAXIMUM  ACCESS (MAS) POSTERIOR LUMBAR INTERBODY FUSION (PLIF) 1 LEVEL L4-5  . NEUROPLASTY / TRANSPOSITION ULNAR NERVE AT ELBOW Left 07/23/2008  . POSTERIOR CERVICAL FUSION/FORAMINOTOMY  06/09/2011   Procedure: POSTERIOR CERVICAL FUSION/FORAMINOTOMY LEVEL 4;  Surgeon: Mariam Dollar, MD;  Location: [facility];  Service: Neurosurgery;  Laterality: N/A;  Cervical three to seven  posterior cervical fusion, Cervical four to seven Laminectomy, bone graft from right iliac crest   . POSTERIOR LUMBAR LAMINECTOMY AND DISKECTOMY  10/15/2009   L3 -- L4  . STENT REMOVAL  08/10/2017   Procedure: STENT REMOVAL;  Surgeon: Rene Paci, MD;  Location: [facility];  Service: Urology;;  . TONSILLECTOMY AND ADENOIDECTOMY  child  . TRANSTHORACIC ECHOCARDIOGRAM  11/27/2013   grade 1 diastolic dysfunction, ef 50-55%/  trivial AR and PR/ mild MR and TR/  PASP  . TRANSURETHRAL RESECTION OF PROSTATE N/A 05/31/2016   Procedure: TRANSURETHRAL RESECTION OF THE PROSTATE  (TURP);  Surgeon: Jethro Bolus, MD;  Location: [facility];  Service: Urology;  Laterality: N/A;       Family History  Problem Relation Age of Onset  . Diabetes Mother   . Parkinson's disease Father   . Heart disease Father     Social History   Tobacco Use  . Smoking status: Former Smoker    Packs/day: 1.50    Years: 55.00    Pack years: 82.50    Types: Cigarettes    Quit date: 05/27/2013    Years since quitting: 6.0  . Smokeless tobacco: Former Neurosurgeon    Types: Chew    Quit date: 02/01/2017  Substance Use Topics  . Alcohol use: No    Alcohol/week: 0.0 standard drinks  . Drug use: No    Home Medications Prior to Admission medications   Medication Sig Start Date End Date Taking? Authorizing Provider  acetaminophen (TYLENOL) 500 MG tablet Take 1,000 mg by mouth every 6 (six) hours as needed for mild pain or fever.   Yes [provider]  albuterol (PROVENTIL HFA;VENTOLIN HFA) 108 (90 Base) MCG/ACT inhaler Inhale 2 puffs into the lungs every 6 (six) hours as needed for wheezing or shortness of breath. 04/28/17  Yes Copland, Gwenlyn Found, MD  allopurinol (ZYLOPRIM) 100 MG tablet Take 1 tablet (100 mg total) by mouth daily. 03/06/19  Yes Sharlene Dory, DO  ascorbic acid (VITAMIN C) 1000 MG tablet Take 1,000 mg by mouth daily.   Yes [provider]  aspirin 325 MG EC tablet Take 325 mg by mouth 3 (three) times daily as needed for pain.    Yes [provider]  atorvastatin (LIPITOR) 20 MG tablet Take 1 tablet (20 mg total) by mouth daily. 03/06/19  Yes Sharlene Dory, DO  AZO-CRANBERRY PO Take 1 tablet by mouth daily.   Yes [provider]  BREO ELLIPTA 200-25 MCG/INH AEPB INHALE 1 PUFF INTO THE LUNGS DAILY Patient taking differently: Inhale 1 puff into the lungs daily.  06/07/19  Yes Wendling, Jilda Roche, DO  colchicine 0.6 MG tablet TAKE 1 TABLET BY MOUTH EVERY 2 HOURS UNTIL RELIEF as needed Patient taking  differently: Take 0.6 mg by mouth See admin instructions. TAKE 1 TABLET BY MOUTH EVERY 2 HOURS UNTIL RELIEF as needed. For gout. 07/14/16  Yes Wendling, Jilda Roche, DO  diazepam (VALIUM) 10 MG tablet Take 2.5 mg by mouth every 8 (eight) hours as needed for anxiety.   Yes [provider]  FLOVENT HFA 110 MCG/ACT inhaler  INHALE 2 PUFFS INTO THE LUNGS TWICE DAILY Patient taking differently: Inhale 2 puffs into the lungs 2 (two) times daily as needed (shortness of breath).  12/18/18  Yes Sharlene Dory, DO  furosemide (LASIX) 20 MG tablet TAKE 2 TABLETS BY MOUTH DAILY Patient taking differently: Take 40 mg by mouth daily as needed for fluid or edema.  04/10/19  Yes Sharlene Dory, DO  ipratropium-albuterol (DUONEB) 0.5-2.5 (3) MG/3ML SOLN Take 3 mLs by nebulization every 6 (six) hours as needed (wheezing/shortness of breath). 12/27/18  Yes Sharlene Dory, DO  magnesium hydroxide (MILK OF MAGNESIA) 800 MG/5ML suspension Take 30 mLs by mouth daily as needed for constipation.    Yes [provider]  Multiple Vitamin (MULTIVITAMIN WITH MINERALS) TABS tablet Take 1 tablet by mouth daily.   Yes [provider]  niacin 500 MG tablet Take 500 mg by mouth daily.    Yes [provider]  Omega-3 Fatty Acids (FISH OIL PO) Take 1 capsule by mouth daily.    Yes [provider]  omeprazole (PRILOSEC) 20 MG capsule TAKE 1 CAPSULE BY MOUTH DAILY IN THE MORNING Patient taking differently: Take 20 mg by mouth every morning.  03/05/19  Yes Wendling, Jilda Roche, DO  Polyethyl Glycol-Propyl Glycol (SYSTANE OP) Place 2 drops into both eyes at bedtime as needed (For dry eyes.).    Yes [provider]  potassium chloride (KLOR-CON) 10 MEQ tablet TAKE 2 TABLETS BY MOUTH DAILY Patient taking differently: Take 20 mEq by mouth daily as needed (Takes with every dose of furosemide).  03/05/19  Yes Sharlene Dory, DO  sertraline (ZOLOFT) 50 MG  tablet Take 1 tablet (50 mg total) by mouth daily. Patient taking differently: Take 25 mg by mouth daily.  03/06/19  Yes Sharlene Dory, DO  trimethoprim (TRIMPEX) 100 MG tablet Take 100 mg by mouth daily.   Yes [provider]    Allergies    Hydromorphone and Oxycodone  Review of Systems   Review of Systems 10 Systems reviewed and are negative for acute change except as noted in the HPI. Physical Exam Updated Vital Signs BP (!) 146/102   Pulse 86   Temp 98.3 F (36.8 C) (Oral)   Resp (!) 26   SpO2 97%   Physical Exam Constitutional:      Comments: Patient is alert.  No respiratory distress.  He is answering questions.  He does appear deconditioned.  HENT:     Head: Normocephalic and atraumatic.  Eyes:     Extraocular Movements: Extraocular movements intact.     Conjunctiva/sclera: Conjunctivae normal.  Cardiovascular:     Rate and Rhythm: Normal rate and regular rhythm.  Pulmonary:     Effort: Pulmonary effort is normal.     Breath sounds: Normal breath sounds.  Abdominal:     General: There is no distension.     Palpations: Abdomen is soft.     Tenderness: There is no abdominal tenderness. There is no guarding.  Musculoskeletal:     Comments: No significant peripheral edema of the lower extremities.  Calves are soft nontender.  Feet and lower legs are in good condition.  Warm and dry.  I can do some limited motion at the hips with both legs without significant pain.  No effusions at the joints.  No reproducible pain over the trochanters.  Skin:    General: Skin is warm and dry.  Neurological:     Comments: Patient is alert.  He  is situationally oriented.  Speech is clear.  He does have poor general recall.  Patient can try to assist in movement with his arms.  He has very weak lower extremities and has very limited baseline ability to move himself or raise his legs.  (He reports this is chronic).  Psychiatric:        Mood and Affect: Mood normal.      ED Results / Procedures / Treatments   Labs (all labs ordered are listed, but only abnormal results are displayed) Labs Reviewed  COMPREHENSIVE METABOLIC PANEL - Abnormal; Notable for the following components:      Result Value   Glucose, Bld 113 (*)    All other components within normal limits  CBC WITH DIFFERENTIAL/PLATELET - Abnormal; Notable for the following components:   WBC 11.3 (*)    Neutro Abs 8.8 (*)    All other components within normal limits  CULTURE, BLOOD (ROUTINE X 2)  CULTURE, BLOOD (ROUTINE X 2)  RESPIRATORY PANEL BY RT PCR (FLU A&B, COVID)  LIPASE, BLOOD  LACTIC ACID, PLASMA  PROTIME-INR  URINALYSIS, ROUTINE W REFLEX MICROSCOPIC  POC SARS CORONAVIRUS 2 AG -  ED  TROPONIN I (HIGH SENSITIVITY)  TROPONIN I (HIGH SENSITIVITY)    EKG None  Radiology DG Chest 2 View  Result Date: 06/09/2019 CLINICAL DATA:  Fall with pain EXAM: CHEST - 2 VIEW COMPARISON:  Chest radiograph dated 06/04/2018. FINDINGS: The heart size is within normal limits. Vascular calcifications are seen in the aortic arch. The lungs are mildly hyperinflated, likely reflecting emphysema. Both lungs are clear. Fixation hardware is seen in the lower cervical spine. IMPRESSION: No active cardiopulmonary disease. Electronically Signed   By: Romona Curls M.D.   On: 06/09/2019 16:17   DG Lumbar Spine Complete  Result Date: 06/09/2019 CLINICAL DATA:  79 year old male with fall and back pain. EXAM: LUMBAR SPINE - COMPLETE 4+ VIEW COMPARISON:  Lumbar spine radiograph dated 12/10/2014. FINDINGS: Five lumbar type vertebra. No definite acute fracture identified. Evaluation for fracture however is very limited due to advanced osteopenia. L4-L5 posterior fusion and disc spacer noted. There is multilevel disc space narrowing most prominent at T12-L1. Multilevel facet arthropathy. There is advanced atherosclerotic calcification of the abdominal aorta. The aorta is ectatic measuring up to 2.6 cm in diameter.  Right upper quadrant cholecystectomy clips. IMPRESSION: 1. No definite acute fracture or subluxation. Evaluation however is very limited due to advanced osteopenia. 2. Multilevel degenerative changes with lower lumbar fusion. 3. Advanced Aortic Atherosclerosis (ICD10-I70.0). Electronically Signed   By: Elgie Collard M.D.   On: 06/09/2019 16:10   DG Chest Port 1 View  Result Date: 06/11/2019 CLINICAL DATA:  Cough, fever and back pain. EXAM: PORTABLE CHEST 1 VIEW COMPARISON:  Single-view of the chest 06/09/2019. CT chest 01/11/2019. FINDINGS: The lungs are emphysematous. There is new airspace disease in the periphery of the left lower lobe. Right lung is clear. Atherosclerosis is noted. Heart size is normal. No pneumothorax. IMPRESSION: New left basilar airspace disease could be due to atelectasis or pneumonia. Emphysema. Atherosclerosis. Electronically Signed   By: Drusilla Kanner M.D.   On: 06/11/2019 15:13   DG Knee Complete 4 Views Left  Result Date: 06/09/2019 CLINICAL DATA:  Fall with knee pain EXAM: LEFT KNEE - COMPLETE 4+ VIEW COMPARISON:  None. FINDINGS: No evidence of fracture, dislocation, or joint effusion. Mild patellofemoral joint osteoarthritis is noted. There is diffuse osseous demineralization. Soft tissues are unremarkable. IMPRESSION: No acute osseous abnormality. Electronically Signed  By: Romona Curlsyler  Litton M.D.   On: 06/09/2019 16:11   CT Renal Stone Study  Result Date: 06/09/2019 CLINICAL DATA:  Flank pain. EXAM: CT ABDOMEN AND PELVIS WITHOUT CONTRAST TECHNIQUE: Multidetector CT imaging of the abdomen and pelvis was performed following the standard protocol without IV contrast. COMPARISON:  CT abdomen pelvis dated 07/15/2017. FINDINGS: Lower chest: Mild bibasilar atelectasis is noted. Hepatobiliary: No focal liver abnormality is seen. Status post cholecystectomy. No biliary dilatation. Pancreas: There is fatty atrophy of the pancreas. Spleen: Normal in size without focal abnormality.  Adrenals/Urinary Tract: Adrenal glands are unremarkable. Bilateral nonobstructive renal calculi measure up to 9 mm on the right and 5 mm on the left. The distal aspect of the right ureter near the ureterovesical junction is focally dilated which may reflect changes from prior obstruction in this location. No finding is identified at the ureterovesical junction to suggest ongoing obstruction. Left renal cysts measure up to 4.2 cm. There is no hydronephrosis on the left. The bladder is unremarkable. Stomach/Bowel: Stomach is within normal limits. Appendix appears normal. There is colonic diverticulosis without evidence of diverticulitis. No evidence of bowel wall thickening, distention, or inflammatory changes. Vascular/Lymphatic: Aortic atherosclerosis. No enlarged abdominal or pelvic lymph nodes. Reproductive: Prostate is unremarkable. Other: No abdominal wall hernia or abnormality. No abdominopelvic ascites. Musculoskeletal: No acute osseous findings. Fixation hardware is seen at L4-L5. IMPRESSION: 1. Focal dilatation of the distal right ureter near the ureterovesical junction may reflect changes from prior obstruction in this location. No finding is identified to suggest ongoing obstruction. 2. Bilateral nonobstructive renal calculi. No hydronephrosis on either side. Aortic Atherosclerosis (ICD10-I70.0). Electronically Signed   By: Romona Curlsyler  Litton M.D.   On: 06/09/2019 16:42   DG HIPS BILAT WITH PELVIS MIN 5 VIEWS  Result Date: 06/09/2019 CLINICAL DATA:  Fall with left hip pain EXAM: DG HIP (WITH OR WITHOUT PELVIS) 5+V BILAT COMPARISON:  None. FINDINGS: There is no evidence of hip fracture or dislocation. There is diffuse osseous demineralization. Mild degenerative changes are seen in both hips. Fixation hardware is seen in the lower lumbar spine. IMPRESSION: No acute fracture or dislocation. Electronically Signed   By: Romona Curlsyler  Litton M.D.   On: 06/09/2019 16:12    Procedures Procedures (including critical  care time)  Medications Ordered in ED Medications  cefTRIAXone (ROCEPHIN) 1 g in sodium chloride 0.9 % 100 mL IVPB (has no administration in time range)  azithromycin (ZITHROMAX) 500 mg in sodium chloride 0.9 % 250 mL IVPB (has no administration in time range)    ED Course  I have reviewed the triage vital signs and the nursing notes.  Pertinent labs & imaging results that were available during my care of the patient were reviewed by me and considered in my medical decision making (see chart for details).    MDM Rules/Calculators/A&P                     Patient has had increasing generalized weakness and now increase in his baseline temperature with some increased mild confusion and decreased ADLs.  Patient's wife reports he has had some coughing.  He has not appeared short of breath or complained of chest pain.  Chest x-ray shows consolidation versus atelectasis left lower base.  Patient does have mild elevation in white blood cell count.  Lactic acid is normal.  Patient is afebrile at this time.  Currently suspect pneumonia with early constitutional symptoms.  This may be etiology for increased weakness, falls and confusion.  Will plan for admission.  Patient will also require PT evaluation and evaluation for possible nursing home placement/rehabilitation for baseline mobility function. Final Clinical Impression(s) / ED Diagnoses Final diagnoses:  Community acquired pneumonia of left lower lobe of lung  Severe comorbid illness    Rx / DC Orders ED Discharge Orders    None       Arby BarrettePfeiffer, Jaime Grizzell, MD 06/11/19 1540

## 2019-06-12 ENCOUNTER — Institutional Professional Consult (permissible substitution): Payer: Medicare Other | Admitting: Diagnostic Neuroimaging

## 2019-06-12 ENCOUNTER — Inpatient Hospital Stay (HOSPITAL_COMMUNITY): Payer: Medicare Other

## 2019-06-12 DIAGNOSIS — F418 Other specified anxiety disorders: Secondary | ICD-10-CM

## 2019-06-12 DIAGNOSIS — G6289 Other specified polyneuropathies: Secondary | ICD-10-CM

## 2019-06-12 DIAGNOSIS — R269 Unspecified abnormalities of gait and mobility: Secondary | ICD-10-CM

## 2019-06-12 DIAGNOSIS — K219 Gastro-esophageal reflux disease without esophagitis: Secondary | ICD-10-CM

## 2019-06-12 DIAGNOSIS — N400 Enlarged prostate without lower urinary tract symptoms: Secondary | ICD-10-CM

## 2019-06-12 DIAGNOSIS — J439 Emphysema, unspecified: Secondary | ICD-10-CM

## 2019-06-12 DIAGNOSIS — G20 Parkinson's disease: Secondary | ICD-10-CM

## 2019-06-12 LAB — COMPREHENSIVE METABOLIC PANEL
ALT: 15 U/L (ref 0–44)
AST: 22 U/L (ref 15–41)
Albumin: 3.2 g/dL — ABNORMAL LOW (ref 3.5–5.0)
Alkaline Phosphatase: 83 U/L (ref 38–126)
Anion gap: 11 (ref 5–15)
BUN: 20 mg/dL (ref 8–23)
CO2: 22 mmol/L (ref 22–32)
Calcium: 8.7 mg/dL — ABNORMAL LOW (ref 8.9–10.3)
Chloride: 106 mmol/L (ref 98–111)
Creatinine, Ser: 0.68 mg/dL (ref 0.61–1.24)
GFR calc Af Amer: >60 mL/min (ref >60)
GFR calc non Af Amer: >60 mL/min (ref >60)
Glucose, Bld: 108 mg/dL — ABNORMAL HIGH (ref 70–99)
Potassium: 4.2 mmol/L (ref 3.5–5.1)
Sodium: 139 mmol/L (ref 135–145)
Total Bilirubin: 1.3 mg/dL — ABNORMAL HIGH (ref 0.3–1.2)
Total Protein: 7.1 g/dL (ref 6.5–8.1)

## 2019-06-12 LAB — CBC
HCT: 37.8 % — ABNORMAL LOW (ref 39.0–52.0)
Hemoglobin: 11.8 g/dL — ABNORMAL LOW (ref 13.0–17.0)
MCH: 29.7 pg (ref 26.0–34.0)
MCHC: 31.2 g/dL (ref 30.0–36.0)
MCV: 95.2 fL (ref 80.0–100.0)
Platelets: 253 10*3/uL (ref 150–400)
RBC: 3.97 MIL/uL — ABNORMAL LOW (ref 4.22–5.81)
RDW: 13.2 % (ref 11.5–15.5)
WBC: 9.8 10*3/uL (ref 4.0–10.5)
nRBC: 0 % (ref 0.0–0.2)

## 2019-06-12 LAB — HIV ANTIBODY (ROUTINE TESTING W REFLEX): HIV Screen 4th Generation wRfx: NONREACTIVE

## 2019-06-12 MED ORDER — TRAMADOL HCL 50 MG PO TABS
50.0000 mg | ORAL_TABLET | Freq: Two times a day (BID) | ORAL | Status: DC | PRN
  Administered 2019-06-13: 50 mg via ORAL
  Filled 2019-06-12: qty 1

## 2019-06-12 MED ORDER — RESOURCE THICKENUP CLEAR PO POWD
ORAL | Status: DC | PRN
  Filled 2019-06-12: qty 125

## 2019-06-12 MED ORDER — CHLORHEXIDINE GLUCONATE CLOTH 2 % EX PADS
6.0000 | MEDICATED_PAD | Freq: Every day | CUTANEOUS | Status: DC
  Administered 2019-06-12 – 2019-06-13 (×2): 6 via TOPICAL

## 2019-06-12 MED ORDER — AZITHROMYCIN 250 MG PO TABS
500.0000 mg | ORAL_TABLET | Freq: Every day | ORAL | Status: DC
  Administered 2019-06-12 – 2019-06-13 (×2): 500 mg via ORAL
  Filled 2019-06-12 (×2): qty 2

## 2019-06-12 MED ORDER — ACETAMINOPHEN 325 MG PO TABS
650.0000 mg | ORAL_TABLET | Freq: Four times a day (QID) | ORAL | Status: DC | PRN
  Administered 2019-06-12 – 2019-06-13 (×2): 650 mg via ORAL
  Filled 2019-06-12 (×2): qty 2

## 2019-06-12 NOTE — Progress Notes (Signed)
PHARMACIST - PHYSICIAN COMMUNICATION  CONCERNING: Antibiotic IV to Oral Route Change Policy  RECOMMENDATION: This patient is receiving azithromycin by the intravenous route.  Based on criteria approved by the Pharmacy and Therapeutics Committee, the antibiotic(s) is/are being converted to the equivalent oral dose form(s).   DESCRIPTION: These criteria include:  Patient being treated for a respiratory tract infection, urinary tract infection, cellulitis or clostridium difficile associated diarrhea if on metronidazole  The patient is not neutropenic and does not exhibit a GI malabsorption state  The patient is eating (either orally or via tube) and/or has been taking other orally administered medications for a least 24 hours  The patient is improving clinically and has a Tmax < 100.5  If you have questions about this conversion, please contact the Pharmacy Department  []  ( [phone] )  [place] []  ( [phone]  [facility] []  ( [phone]  [place] []  ( [phone]  [facility] [x]  ( [phone] )  [facility]  

## 2019-06-12 NOTE — Progress Notes (Signed)
PROGRESS NOTE    Evan [name]  JOA:[id number] DOB: June 13, 1940 DOA: 06/11/2019 PCP: Shelda Pal, DO    Brief Narrative:  Evan [name] is a 79 year old Caucasian gentleman with past medical history remarkable for chronic diastolic congestive heart failure, chronic back pain, HLD, anxiety, GERD, Parkinson's disease, BPH who presents to the ED with fever, progressive fatigue and weakness.  Poor historian at baseline, HPI gathered from ED reports and spouse.  Spouse reports he is unable to walk with multiple falls.  He has had a chronic cough secondary to his COPD and nonadherence with his home inhaler therapy but recently with new sputum production.  She also reports an elevated temperature of 99.9 F.  In the ED, temperature 98.3 F, HR 83, RR 18, BP 106/94, SPO2 99% on 2 L nasal cannula.  The BC count 11.3, chest x-ray with findings of left basilar airspace disease.  Patient was started on ceftriaxone and azithromycin and the EDP referred for admission given acute hypoxic respiratory failure likely secondary to pneumonia in the setting of history of COPD.   Assessment & Plan:   Principal Problem:   Left lower lobe pneumonia Active Problems:   Pulmonary emphysema (HCC)   Diastolic dysfunction with heart failure (HCC)   Peripheral neuropathy   Gastroesophageal reflux disease without esophagitis   Depression with anxiety   BPH (benign prostatic hyperplasia)   Abnormality of gait   Chronic diastolic CHF (congestive heart failure) (HCC)   Parkinsonism (HCC)   Acute respiratory failure with hypoxia; present on admission Left lower lobe pneumonia Patient presenting with progressive fatigue/weakness associated with temperature of 99.9 at home.  Also noted to be hypoxic, not on home O2.  Patient was found to have an elevated WBC count 11.3, chest x-ray findings with left basilar airspace disease consistent with pneumonia. --Urine strep antigen and Legionella  antigen: Pending --Continue ceftriaxone 2g IV q24h --Azithromycin 500 mg p.o. daily, plan 5-day course --Flutter valve --Speech therapy evaluation to evaluate aspiration risk --Continue supplemental oxygen, titrate to maintain SPO2 greater than 88% --6-minute walk test today --Supportive care  History of COPD Not oxygen dependent at baseline.  Noted to be hypoxic on presentation.  Apparently has not been utilizing his home inhalers recently. --Breo Elipta 200-51mg 1 puff daily --Duo nebs every 6 hours as needed wheezing/shortness of breath --Titrate supplemental oxygen to maintain SPO2 greater than 860% Chronic diastolic congestive heart failure Appears compensated.  Chest x-ray with no findings of vascular congestion.  On furosemide 40 mg p.o. daily as needed for fluid/edema. --Continue monitor strict I's and O's and daily weights  HLD: Continue atorvastatin 10 mg p.o. daily, niacin 500 mg  Anxiety/depression: Zoloft 25 mg periodic, diazepam 2.5 mg every 8 hours as needed  GERD: Continue PPI  Parkinson's disease. Patient's mobility and speech pattern has been declining over last several months per spouse.  Roughly 2 weeks ago, at baseline he was able to ambulate from his bed with the use of a wheelchair to his lift chair, now currently is unable to navigate from the bed to the wheelchair the past week. --PT/OT evaluation  History of gout: Continue allopurinol 100 mg p.o. daily   DVT prophylaxis: Lovenox Code Status: DNR Family Communication: Updated patients spouse DButch Pennyvia telephone this morning. Disposition Plan:      Patient is from: Home with spouse with current home health therapy with [facility]     Anticipated Disposition:  To be determined, pending PT/OT evaluation, anticipate  likely need of SNF; social work updated on likely need     Barriers to discharge or conditions that needs to be met prior to discharge: Continues with hypoxia on supplemental oxygen  which he is oxygen independent at baseline, pending PT/OT evaluation, continues on IV antibiotics for pneumonia  Consultants:   None  Procedures:   None  Antimicrobials:   Azithromycin 2/22  Ceftriaxone 2/22   Subjective: Patient seen and examined bedside, resting comfortably.  Difficult to understand as his speech is garbled.  Asking for orange juice this morning.  Continues with some shortness of breath and productive cough.  No other specific complaints at this time.  No family present at bedside.  Denies headache, no chest pain, no palpitations, no abdominal pain, no current fever/chills/night sweats, no nausea/vomiting/diarrhea.  No acute events overnight per nursing staff.  Objective: Vitals:   06/11/19 1852 06/11/19 2134 06/11/19 2153 06/12/19 0542  BP: 132/80 125/74  (!) 117/92  Pulse: 92 84  85  Resp: _0 Temp: 97.9 F (36.6 C) 99.4 F (37.4 C)  98.3 F (36.8 C)  TempSrc: Oral Oral  Oral  SpO2: 99% 98%  96%  Weight:   86 kg   Height:   5' 7" (1.702 m)     Intake/Output Summary (Last 24 hours) at 06/12/2019 1100 Last data filed at 06/12/2019 1000 Gross per 24 hour  Intake 350 ml  Output 800 ml  Net -450 ml   Filed Weights   06/11/19 2153  Weight: 86 kg    Examination:  General exam: Appears calm and comfortable, difficult to understand with some garbled speech Respiratory system: Decreased breath sounds bilateral bases, otherwise clear to auscultation. Respiratory effort normal.  On 2 L nasal cannula oxygenating 96% Cardiovascular system: S1 & S2 heard, RRR. No JVD, murmurs, rubs, gallops or clicks. No pedal edema. Gastrointestinal system: Abdomen is nondistended, soft and nontender. No organomegaly or masses felt. Normal bowel sounds heard. Central nervous system: Alert, oriented to person/place, not time. No focal neurological deficits. Extremities: Mild asymmetric weakness right lower extremity in comparison with left lower extremity 2/5, upper  extremities strength intact. Skin: No rashes, lesions or ulcers Psychiatry: Judgement and insight appear normal. Mood & affect appropriate.     Data Reviewed: I have personally reviewed following labs and imaging studies  CBC: Recent Labs  Lab 06/09/19 1624 06/11/19 1339 06/12/19 0432  WBC 8.3 11.3* 9.8  NEUTROABS 5.8 8.8*  --   HGB 12.2* 13.0 11.8*  HCT 37.7* 41.0 37.8*  MCV 95.4 95.3 95.2  PLT 243 272 016   Basic Metabolic Panel: Recent Labs  Lab 06/09/19 1624 06/11/19 1339 06/12/19 0432  NA 139 140 139  K 4.3 4.1 4.2  CL 106 105 106  CO2 _1 GLUCOSE 102* 113* 108*  BUN _2 CREATININE 0.73 0.78 0.68  CALCIUM 8.6* 8.9 8.7*   GFR: Estimated Creatinine Clearance: 79.8 mL/min (by C-G formula based on SCr of 0.68 mg/dL). Liver Function Tests: Recent Labs  Lab 06/09/19 1624 06/11/19 1339 06/12/19 0432  AST _3 ALT _4 ALKPHOS 77 87 83  BILITOT 0.6 0.9 1.3*  PROT 6.9 7.3 7.1  ALBUMIN 3.3* 3.6 3.2*   Recent Labs  Lab 06/11/19 1339  LIPASE 13   No results for input(s): AMMONIA in the last 168 hours. Coagulation Profile: Recent Labs  Lab 06/11/19 1339  INR 1.0   Cardiac Enzymes: No results  for input(s): CKTOTAL, CKMB, CKMBINDEX, TROPONINI in the last 168 hours. BNP (last 3 results) No results for input(s): PROBNP in the last 8760 hours. HbA1C: No results for input(s): HGBA1C in the last 72 hours. CBG: No results for input(s): GLUCAP in the last 168 hours. Lipid Profile: No results for input(s): CHOL, HDL, LDLCALC, TRIG, CHOLHDL, LDLDIRECT in the last 72 hours. Thyroid Function Tests: No results for input(s): TSH, T4TOTAL, FREET4, T3FREE, THYROIDAB in the last 72 hours. Anemia Panel: No results for input(s): VITAMINB12, FOLATE, FERRITIN, TIBC, IRON, RETICCTPCT in the last 72 hours. Sepsis Labs: Recent Labs  Lab 06/11/19 1339  LATICACIDVEN 1.3    Recent Results (from the past 240 hour(s))  Urine culture     Status:  Abnormal   Collection Time: 06/09/19  5:12 PM   Specimen: Urine, Random  Result Value Ref Range Status   Specimen Description   Final    URINE, RANDOM Performed at [facility] [address] [place], [place] [address]    Special Requests   Final    NONE Performed at [facility], [place] [address], [place], [place] [address]    Culture (A)  Final    <10,000 COLONIES/mL INSIGNIFICANT GROWTH Performed at [facility] [address] [place], [place] [address]    Report Status 06/11/2019 FINAL  Final  Respiratory Panel by RT PCR (Flu A&B, Covid) - Nasopharyngeal Swab     Status: None   Collection Time: 06/11/19  3:32 PM   Specimen: Nasopharyngeal Swab  Result Value Ref Range Status   SARS Coronavirus 2 by RT PCR NEGATIVE NEGATIVE Final    Comment: (NOTE) SARS-CoV-2 target nucleic acids are NOT DETECTED. The SARS-CoV-2 RNA is generally detectable in upper respiratoy specimens during the acute phase of infection. The lowest concentration of SARS-CoV-2 viral copies this assay can detect is 131 copies/mL. A negative result does not preclude SARS-Cov-2 infection and should not be used as the sole basis for treatment or other patient management decisions. A negative result may occur with  improper specimen collection/handling, submission of specimen other than nasopharyngeal swab, presence of viral mutation(s) within the areas targeted by this assay, and inadequate number of viral copies (<131 copies/mL). A negative result must be combined with clinical observations, patient history, and epidemiological information. The expected result is Negative. Fact Sheet for Patients:  PinkCheek.be Fact Sheet for Healthcare Providers:  GravelBags.it This test is not yet ap proved or cleared by the [place] FDA and  has been authorized for detection and/or diagnosis of SARS-CoV-2 by FDA  under an Emergency Use Authorization (EUA). This EUA will remain  in effect (meaning this test can be used) for the duration of the COVID-19 declaration under Section 564(b)(1) of the Act, 21 U.S.C. section 360bbb-3(b)(1), unless the authorization is terminated or revoked sooner.    Influenza A by PCR NEGATIVE NEGATIVE Final   Influenza B by PCR NEGATIVE NEGATIVE Final    Comment: (NOTE) The Xpert Xpress SARS-CoV-2/FLU/RSV assay is intended as an aid in  the diagnosis of influenza from Nasopharyngeal swab specimens and  should not be used as a sole basis for treatment. Nasal washings and  aspirates are unacceptable for Xpert Xpress SARS-CoV-2/FLU/RSV  testing. Fact Sheet for Patients: PinkCheek.be Fact Sheet for Healthcare Providers: GravelBags.it This test is not yet approved or cleared by the [place] FDA and  has been authorized for detection and/or diagnosis of SARS-CoV-2 by  FDA under an Emergency Use Authorization (  EUA). This EUA will remain  in effect (meaning this test can be used) for the duration of the  Covid-19 declaration under Section 564(b)(1) of the Act, 21  U.S.C. section 360bbb-3(b)(1), unless the authorization is  terminated or revoked. Performed at [facility], [place] [address], [place], [place] [address]   Culture, blood (routine x 2)     Status: None (Preliminary result)   Collection Time: 06/11/19  3:59 PM   Specimen: BLOOD  Result Value Ref Range Status   Specimen Description   Final    BLOOD LEFT ANTECUBITAL Performed at [facility] [address] [place], [place] [address]    Special Requests   Final    BOTTLES DRAWN AEROBIC AND ANAEROBIC Blood Culture adequate volume Performed at [facility] [address] [place], [place] [address]    Culture   Final    NO GROWTH < 12 HOURS Performed at [facility] [address] [place], [place] [address]    Report Status PENDING  Incomplete         Radiology Studies: DG Chest Port 1 View  Result Date: 06/11/2019 CLINICAL DATA:  Cough, fever and back pain. EXAM: PORTABLE CHEST 1 VIEW COMPARISON:  Single-view of the chest 06/09/2019. CT chest 01/11/2019. FINDINGS: The lungs are emphysematous. There is new airspace disease in the periphery of the left lower lobe. Right lung is clear. Atherosclerosis is noted. Heart size is normal. No pneumothorax. IMPRESSION: New left basilar airspace disease could be due to atelectasis or pneumonia. Emphysema. Atherosclerosis. Electronically Signed   By: Inge Rise M.D.   On: 06/11/2019 15:13        Scheduled Meds:  allopurinol  100 mg Oral Daily   ascorbic acid  1,000 mg Oral Daily   atorvastatin  20 mg Oral Daily   azithromycin  500 mg Oral Daily   Chlorhexidine Gluconate Cloth  6 each Topical Daily   enoxaparin (LOVENOX) injection  40 mg Subcutaneous Q24H   fluticasone furoate-vilanterol  1 puff Inhalation Daily   multivitamin with minerals  1 tablet Oral Daily   niacin  500 mg Oral Daily   omega-3 acid ethyl esters  1 g Oral Daily   pantoprazole  40 mg Oral Daily   sertraline  25 mg Oral Daily   Continuous Infusions:  sodium chloride 75 mL/hr at 06/11/19 2044   cefTRIAXone (ROCEPHIN)  IV       LOS: 1 day    Time spent: 37 minutes spent on chart review, discussion with nursing staff, consultants, updating family and interview/physical exam; more than 50% of that time was spent in counseling and/or coordination of care.    Eric J [place] DO [facility] Available via Epic secure chat 7am-7pm After these hours, please refer to coverage provider listed on [email or web address] 06/12/2019, 11:00 AM

## 2019-06-12 NOTE — Progress Notes (Signed)
Modified Barium Swallow Progress Note  Patient Details  Name: Evan [name] MRN: [id number] Date of Birth: [birth year]  Today's Date: 06/12/2019  Modified Barium Swallow completed.  Full report located under Chart Review in the Imaging Section.  Brief recommendations include the following:  Clinical Impression  Pt presents with mild oropharyngeal dysphagia with sensorimotor deficits likely from his Parkinson's disease.  He demonstrated delayed in oral transiting (up to 5 seconds with thin) with dis coordination/lingual pumping allowing premature spillage into pharynx. Anterior loss of thin barium noted due to decreased labial seal.   Mild thin residuals noted thin oral cavity which pt often swallow to clear but trace retention remained posterior lateral sulci.   Pharyngeal swallow c/b decreased laryngeal closure/elevation and inconsistently impaired epiglottic deflection with thin and solid allowing pharyngeal retention and mild penetration of thin liquids.  Epiglottic deflection improved with puree bolus due to increased muscle recruitment likely.  Penetration of thin occurred during the swallow as barium transited into open larynx during swallow.  Cued cough did not fully clear penetrates and "Hock" did not clear pharynx/vallecular region. Advised pt work to strengthen these to improve airway protection.   Given pt also with difficulty controlling bolus amount and cohesion, nectar liquids advised at this time.  Barium tablet taken with pudding precariously lodged at vallecular region without pt awareness - further bolus of pudding facilitated clearance into esophagus. Given his lack of sensation to large amount of pharyngeal residuals, strict precautions advised.  Upon esophageal sweep, pt with only minimal residuals.   Recommend dys3/nectar, allowing tsp of thin, free water between meals after mouth care and medications crushed.  Using video monitor, educated pt to findings/recommendations  during session.   Swallow Evaluation Recommendations       SLP Diet Recommendations: Dysphagia 3 (Mech soft) solids;Nectar thick liquid THIN WATER OK BETWEEN MEALS AFTER MOUTH CARE   Liquid Administration via: Cup;Straw   Medication Administration: Crushed with puree   Supervision: Full supervision/cueing for compensatory strategies;Patient able to self feed   Compensations: Slow rate;Follow solids with liquid;Small sips/bites       Oral Care Recommendations: Oral care QID       Rolena Infante, MS [facility] SLP Acute Rehab Services Office [phone]  Chales Abrahams 06/12/2019,4:12 PM

## 2019-06-12 NOTE — NC FL2 (Signed)
[place] MEDICAID FL2 LEVEL OF CARE SCREENING TOOL     IDENTIFICATION  Patient Name: Evan [name] Birthdate: [birth year] Sex: male Admission Date (Current Location): 06/11/2019  [facility] and [place] Number:  Producer, television/film/video and Address:  [facility],  [address], [place] [address]      Provider Number: [id number]  Attending Physician Name and Address:  [place], Eric J, DO  Relative Name and Phone Number:  Jakiah Goree [phone]    Current Level of Care: Hospital Recommended Level of Care: Skilled Nursing Facility Prior Approval Number:    Date Approved/Denied:   PASRR Number: [id number]  Discharge Plan: SNF    Current Diagnoses: Patient Active Problem List   Diagnosis Date Noted  . Left lower lobe pneumonia 06/11/2019  . Aortic atherosclerosis (HCC)   . Epidermal cyst 01/16/2018  . Hearing loss of right ear 01/16/2018  . Pressure injury of right buttock, stage 2 (HCC) 08/08/2017  . Pressure ulcer of sacral region, stage 1 07/29/2017  . Sepsis (HCC) 07/15/2017  . Ureteropelvic junction (UPJ) obstruction   . Mixed simple and mucopurulent chronic bronchitis (HCC)   . Chronic bronchitis with emphysema (HCC) 05/31/2017  . Bilateral hearing loss 04/25/2017  . Parkinsonism (HCC) 09/03/2016  . Right leg weakness 09/03/2016  . Diverticulosis of colon with hemorrhage   . UTI (urinary tract infection) 12/09/2015  . Rectal bleed 12/07/2015  . Chronic diastolic CHF (congestive heart failure) (HCC) 12/07/2015  . Rectal bleeding 12/07/2015  . Abnormality of gait 10/29/2015  . 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 Weakness of both legs 07/27/2015  . Shortness of breath on exertion 07/24/2015  . Tremor 07/24/2015  . Acute stress reaction 07/24/2015  . Screening, ischemic heart disease 01/29/2015  . Hernia of abdominal cavity 12/20/2014  . Gastroesophageal reflux disease without esophagitis 09/03/2014  . Depression  with anxiety 09/03/2014  . BPH (benign prostatic hyperplasia) 09/03/2014  . Gout, chronic 09/03/2014  . Spondylolisthesis at L4-L5 level 07/22/2014  . Diastolic dysfunction with heart failure (HCC) 01/01/2014  . Peripheral neuropathy 01/01/2014  . DOE (dyspnea on exertion) 11/15/2013  . Leg edema 11/15/2013  . Snoring 11/15/2013  . Pulmonary emphysema (HCC) 05/10/2013    Orientation RESPIRATION BLADDER Height & Weight     Self, Time, Situation, Place  Normal Continent Weight: 86 kg Height:  5\' 7"  (170.2 cm)  BEHAVIORAL SYMPTOMS/MOOD NEUROLOGICAL BOWEL NUTRITION STATUS      Continent Diet(Soft)  AMBULATORY STATUS COMMUNICATION OF NEEDS Skin   Extensive Assist Verbally Skin abrasions(Moisture associated skin damage)                       Personal Care Assistance Level of Assistance  Bathing, Dressing Bathing Assistance: Maximum assistance   Dressing Assistance: Maximum assistance     Functional Limitations Info             SPECIAL CARE FACTORS FREQUENCY  PT (By licensed PT), OT (By licensed OT)     PT Frequency: 5 x weekly OT Frequency: 5 x weekly            Contractures Contractures Info: Not present    Additional Factors Info  Code Status, Allergies Code Status Info: DNR Allergies Info: Hydromorphone, Oxycodone           Current Medications (06/12/2019):  This is the current hospital active medication list Current Facility-Administered Medications  Medication Dose Route Frequency Provider Last Rate Last Admin  . 0.9 %  sodium  chloride infusion   Intravenous Continuous Mariel Aloe, MD 75 mL/hr at 06/11/19 2044 New Bag at 06/11/19 2044  . allopurinol (ZYLOPRIM) tablet 100 mg  100 mg Oral Daily Mariel Aloe, MD   100 mg at 06/12/19 0956  . ascorbic acid (VITAMIN C) tablet 1,000 mg  1,000 mg Oral Daily Mariel Aloe, MD   1,000 mg at 06/12/19 0956  . atorvastatin (LIPITOR) tablet 20 mg  20 mg Oral Daily Mariel Aloe, MD   20 mg at 06/12/19  0956  . azithromycin (ZITHROMAX) tablet 500 mg  500 mg Oral Daily Leodis Sias T, RPH   500 mg at 06/12/19 0956  . cefTRIAXone (ROCEPHIN) 2 g in sodium chloride 0.9 % 100 mL IVPB  2 g Intravenous Q24H Mariel Aloe, MD      . Chlorhexidine Gluconate Cloth 2 % PADS 6 each  6 each Topical Daily [place] Eric J, DO   6 each at 06/12/19 0957  . diazepam (VALIUM) tablet 2.5 mg  2.5 mg Oral Q8H PRN Mariel Aloe, MD      . enoxaparin (LOVENOX) injection 40 mg  40 mg Subcutaneous Q24H Mariel Aloe, MD   40 mg at 06/11/19 2046  . fluticasone furoate-vilanterol (BREO ELLIPTA) 200-25 MCG/INH 1 puff  1 puff Inhalation Daily Mariel Aloe, MD   1 puff at 06/12/19 1127  . ipratropium-albuterol (DUONEB) 0.5-2.5 (3) MG/3ML nebulizer solution 3 mL  3 mL Nebulization Q6H PRN Mariel Aloe, MD      . multivitamin with minerals tablet 1 tablet  1 tablet Oral Daily Mariel Aloe, MD   1 tablet at 06/12/19 [id number]  . niacin tablet 500 mg  500 mg Oral Daily Mariel Aloe, MD   500 mg at 06/12/19 8182  . omega-3 acid ethyl esters (LOVAZA) capsule 1 g  1 g Oral Daily Mariel Aloe, MD   1 g at 06/12/19 0956  . pantoprazole (PROTONIX) EC tablet 40 mg  40 mg Oral Daily Mariel Aloe, MD   40 mg at 06/12/19 0956  . polyvinyl alcohol (LIQUIFILM TEARS) 1.4 % ophthalmic solution 1 drop  1 drop Both Eyes QHS PRN Mariel Aloe, MD      . sertraline (ZOLOFT) tablet 25 mg  25 mg Oral Daily Mariel Aloe, MD   25 mg at 06/12/19 9937     Discharge Medications: Please see discharge summary for a list of discharge medications.  Relevant Imaging Results:  Relevant Lab Results:   Additional Information ss# [id number]  Tereso Unangst, Marjie Skiff, RN

## 2019-06-12 NOTE — Evaluation (Signed)
Physical Therapy Evaluation Patient Details Name: Evan [name] MRN: [id number] DOB: 12-03-1940 Today's Date: 06/12/2019   History of Present Illness  79 yo male admitted with Pna. Hx of Parkinson's, CHF, chronic neck/back pain, neuropathy, falls, COPD  Clinical Impression  Bed level eval on today. Pt declined OOB with PT-"not gonna happen today." He is agreeable to attempting OOB/further mobility next session. Max-Total assist for bed mobility/repositioning. Pt demonstrates heavy R side lean even with back supported. Pt presents with general weakness, decreased activity tolerance, and impaired balance. Pt does not appear to be at a level where he and his wife can safely mobilize/manage care at home. Recommendation is for ST rehab at [facility]. Will follow during hospital stay and progress activity as tolerated.    Follow Up Recommendations SNF    Equipment Recommendations  None recommended by PT    Recommendations for Other Services       Precautions / Restrictions        Mobility  Bed Mobility Overal bed mobility: Needs Assistance            General bed mobility comments: Max-Total A to reposition in bed. Pt leaning heavily to R side. Pt was able to assist a little by pulling on L lower bedrail. Utilized bedpad to scoot, reposition x 3. eclined OOB with PT on today. He will very likely require +2 assist to get to EOB.  Transfers                    Ambulation/Gait                Stairs            Wheelchair Mobility    Modified Rankin (Stroke Patients Only)       Balance Overall balance assessment: Needs assistance   Sitting balance-Leahy Scale: Zero Sitting balance - Comments: Heavy R side lean even with back supported by bed. Pt unable to self correct. Postural control: Right lateral lean                                   Pertinent Vitals/Pain Pain Assessment: Faces Faces Pain Scale: Hurts even more Pain Location:  back,neck Pain Descriptors / Indicators: Discomfort;Sore;Aching Pain Intervention(s): Limited activity within patient's tolerance;Repositioned    Home Living Family/patient expects to be discharged to:: Unsure Living Arrangements: Spouse/significant other   Type of Home: House Home Access: Ramped entrance     Home Layout: One level Home Equipment: Wheelchair - power;Wheelchair - manual;Grab bars - tub/shower      Prior Function Level of Independence: Needs assistance   Gait / Transfers Assistance Needed: transfers only  ADL's / Homemaking Assistance Needed: assist for bathing, dressing        Hand Dominance        Extremity/Trunk Assessment   Upper Extremity Assessment Upper Extremity Assessment: Defer to OT evaluation    Lower Extremity Assessment Lower Extremity Assessment: Generalized weakness    Cervical / Trunk Assessment Cervical / Trunk Assessment: Kyphotic  Communication   Communication: HOH  Cognition Arousal/Alertness: Awake/alert Behavior During Therapy: WFL for tasks assessed/performed Overall Cognitive Status: Within Functional Limits for tasks assessed                                        General Comments  Exercises     Assessment/Plan    PT Assessment Patient needs continued PT services  PT Problem List Decreased strength;Decreased mobility;Decreased range of motion;Decreased activity tolerance;Decreased balance;Pain       PT Treatment Interventions DME instruction;Gait training;Therapeutic activities;Therapeutic exercise;Patient/family education;Balance training;Functional mobility training    PT Goals (Current goals can be found in the Care Plan section)  Acute Rehab PT Goals Patient Stated Goal: none stated during session on today PT Goal Formulation: With patient Time For Goal Achievement: 07/03/19 Potential to Achieve Goals: Fair    Frequency Min 2X/week   Barriers to discharge        Co-evaluation                AM-PAC PT "6 Clicks" Mobility  Outcome Measure Help needed turning from your back to your side while in a flat bed without using bedrails?: Total Help needed moving from lying on your back to sitting on the side of a flat bed without using bedrails?: Total Help needed moving to and from a bed to a chair (including a wheelchair)?: Total Help needed standing up from a chair using your arms (e.g., wheelchair or bedside chair)?: Total Help needed to walk in hospital room?: Total Help needed climbing 3-5 steps with a railing? : Total 6 Click Score: 6    End of Session   Activity Tolerance: Patient limited by pain;Patient limited by fatigue Patient left: in bed;with call bell/phone within reach;with bed alarm set   PT Visit Diagnosis: Muscle weakness (generalized) (M62.81);Other symptoms and signs involving the nervous system (R29.898)    Time: 3329-5188 PT Time Calculation (min) (ACUTE ONLY): 8 min   Charges:   PT Evaluation $PT Eval Moderate Complexity: 1 Mod            Colene Mines P, PT Acute Rehabilitation

## 2019-06-12 NOTE — Progress Notes (Signed)
MBS completed, full report to follow.  Pt presents with mild oropharyngeal dysphagia with sensorimotor deficits likely from his Parkinson's disease.  He demonstrated delayed in oral transiting (up to 5 seconds with thin) with dis coordination/lingual pumping allowing premature spillage into pharynx. Anterior loss of thin barium noted due to decreased labial seal.   Mild thin residuals noted thin oral cavity which pt often swallow to clear but trace retention remained posterior lateral sulci.    Pharyngeal swallow c/b decreased laryngeal closure/elevation and inconsistently impaired epiglottic deflection with thin and solid allowing pharyngeal retention and mild penetration of thin liquids.  Epiglottic deflection improved with puree bolus due to increased muscle recruitment likely.  Penetration of thin occurred during the swallow as barium transited into open larynx during swallow.  Cued cough did not fully clear penetrates and "Hock" did not clear pharynx/vallecular region. Advised pt work to strengthen these to improve airway protection.    Given pt also with difficulty controlling bolus amount and cohesion, nectar liquids advised at this time.  Barium tablet taken with pudding precariously lodged at vallecular region without pt awareness - further bolus of pudding facilitated clearance into esophagus. Given his lack of sensation to large amount of pharyngeal residuals, strict precautions advised.  Upon esophageal sweep, pt with only minimal residuals.    Recommend dys3/nectar, allowing tsp of thin, free water between meals after mouth care and medications crushed.  Using video monitor, educated pt to findings/recommendations during session. Will follow for treatment, RMST advised.    Rolena Infante, MS [facility] SLP Acute [employer] [phone]

## 2019-06-12 NOTE — TOC Progression Note (Signed)
Transition of Care [facility]) - Progression Note    Patient Details  Name: Evan [name] MRN: [id number] Date of Birth: 21-Nov-1940  Transition of Care [facility]) CM/SW Contact  Roshni Burbano, Meriam Sprague, RN Phone Number: 06/12/2019, 2:37 PM  Clinical Narrative:     This cm spoke to pt at bedside who defers dc planning to his wife. This cm called wife Evan [name] to discuss dc plans. Evan [name] states she would like pt to go to SNF for a little while as she can not care for pt by herself right now. FL2 faxed out to area facilities.  Expected Discharge Plan: Skilled Nursing Facility Barriers to Discharge: Continued Medical Work up  Expected Discharge Plan and Services Expected Discharge Plan: Skilled Nursing Facility In-house Referral: Clinical Social Work Discharge Planning Services: CM Consult Post Acute Care Choice: Home Health Living arrangements for the past 2 months: Single Family Home                           HH Arranged: RN, PT, OT, Nurse's Aide, Social Work [employer] Agency: [facility] ([facility]) Date HH Agency Contacted: 06/11/19 Time HH Agency Contacted: 1221 Representative spoke with at [facility] Agency: Alfonse Flavors   Social Determinants of Health (SDOH) Interventions    Readmission Risk Interventions No flowsheet data found.

## 2019-06-12 NOTE — Progress Notes (Signed)
PT able to demonstrate hands on understanding of Flutter device at this time. NPC at this time.

## 2019-06-12 NOTE — Plan of Care (Signed)
  Problem: Activity: Goal: Risk for activity intolerance will decrease Outcome: Progressing   Problem: Safety: Goal: Ability to remain free from injury will improve Outcome: Progressing   Problem: Pain Managment: Goal: General experience of comfort will improve Outcome: Not Progressing   Problem: Skin Integrity: Goal: Risk for impaired skin integrity will decrease Outcome: Not Progressing

## 2019-06-12 NOTE — Evaluation (Signed)
Clinical/Bedside Swallow Evaluation Patient Details  Name: Evan [name] MRN: [id number] Date of Birth: 1940/06/11  Today's Date: 06/12/2019 Time: SLP Start Time (ACUTE ONLY): 1211 SLP Stop Time (ACUTE ONLY): 1235 SLP Time Calculation (min) (ACUTE ONLY): 24 min  Past Medical History:  Past Medical History:  Diagnosis Date  . Abnormality of gait    multiple cervical spondylosis  . Anxiety   . Aortic atherosclerosis (HCC)   . Bilateral leg weakness    due to back surgeries--  mostly uses wheelchair and at home very short distant w/ walker  . Bilateral lower extremity edema   . BPH (benign prostatic hyperplasia)   . Cervical spondylosis without myelopathy    per dr cram (neuro surg.)  . Chronic back pain   . Chronic bronchitis (HCC)   . Chronic gout    08-02-2017  per pt gout stable ,  last bout beginning March 2019  . Depression   . Diastolic CHF (HCC)   . Diverticulosis of colon   . Emphysema/COPD [facility]) pulmologist-  dr Chilton Greathouse   last acute exacerbation 07-12-2017 (documented in epic)  . Full dentures   . GERD (gastroesophageal reflux disease)   . Hard of hearing    does not wear hearing aides  . Hemorrhoids   . History of sepsis    07-15-2017  urosepsis secondary to kidney stone extraction--  completed 14d IV vancomyocin   . Hyperlipidemia   . Nephrolithiasis    per CT 07-15-2017 multiple non-obstructive renal stones  . Numbness    hands --- related to neck  . OA (osteoarthritis)    hands  . Pressure ulcer of sacral region, stage 1    per pcp note in epic dated 07-29-2017  . RBBB (right bundle branch block)   . Right ureteral stone   . Self-catheterizes urinary bladder    Past Surgical History:  Past Surgical History:  Procedure Laterality Date  . ABDOMINAL HERNIA REPAIR  1970's  . ANTERIOR CERVICAL DECOMP/DISCECTOMY FUSION  07/03/2001   C4 -- C5/  post op Exploration and evacuation cervical hematoma  . BLEPHAROPLASTY  2008  . CARDIOVASCULAR  STRESS TEST  05/27/1998   normal nuclear study w/ no ischemia/  normal LV function and wall motion , ef 64%  . CARPAL TUNNEL RELEASE Left 2003 approx.  . CERVICAL FUSION  1995   C5 -- C6  . COLONOSCOPY WITH PROPOFOL N/A 02/10/2016   Procedure: COLONOSCOPY WITH PROPOFOL;  Surgeon: Hilarie Fredrickson, MD;  Location: [facility];  Service: Endoscopy;  Laterality: N/A;  . CYSTO/  LEFT RETROGRADE PYELOGRAM/  BALLOON DILATION LEFT URETER/  URETEROSCOPY/  STENT PLACEMENT  07/28/2000  . CYSTOSCOPY W/ URETERAL STENT PLACEMENT Right 07/15/2017   Procedure: CYSTOSCOPY WITH RETROGRADE PYELOGRAM/URETERAL STENT PLACEMENT;  Surgeon: Ihor Gully, MD;  Location: [facility];  Service: Urology;  Laterality: Right;  . CYSTOSCOPY/URETEROSCOPY/HOLMIUM LASER/STENT PLACEMENT Right 08/10/2017   Procedure: CYSTOSCOPY/URETEROSCOPY/HOLMIUM LASER/STENT PLACEMENT;  Surgeon: Rene Paci, MD;  Location: [facility] [place];  Service: Urology;  Laterality: Right;  ONLY NEEDS 45 MIN FOR PROCEDURE  . ESOPHAGOGASTRODUODENOSCOPY    . EXPLORATION AND RE-DO CERVICAL FUSION C4--5 AND ANTERIOR CERVIAL DISKECTOMY FUSION C3--4  08/09/2007   POST-OP EXPLORATION AND EVACUATION RETROPHARYNGEAL HEMATOMA  . LAPAROSCOPIC CHOLECYSTECTOMY  1990'S  . LUMBAR LAMINECTOMY/DECOMPRESSION MICRODISCECTOMY  10/29/2011   Procedure: LUMBAR LAMINECTOMY/DECOMPRESSION MICRODISCECTOMY 1 LEVEL;  Surgeon: Mariam Dollar, MD;  Location: [facility];  Service: Neurosurgery;  Laterality: Right;  Right Lumbar Three-Four Lumbar Laminectomy/Microdiscectomy  .  MAXIMUM ACCESS (MAS)POSTERIOR LUMBAR INTERBODY FUSION (PLIF) 1 LEVEL N/A 07/22/2014   Procedure: FOR MAXIMUM ACCESS (MAS) POSTERIOR LUMBAR INTERBODY FUSION (PLIF) 1 LEVEL;  Surgeon: Kary Kos, MD;  Location: [place] [facility];  Service: Neurosurgery;  Laterality: N/A;  FOR MAXIMUM ACCESS (MAS) POSTERIOR LUMBAR INTERBODY FUSION (PLIF) 1 LEVEL L4-5  . NEUROPLASTY / TRANSPOSITION ULNAR NERVE AT ELBOW Left  07/23/2008  . POSTERIOR CERVICAL FUSION/FORAMINOTOMY  06/09/2011   Procedure: POSTERIOR CERVICAL FUSION/FORAMINOTOMY LEVEL 4;  Surgeon: Elaina Hoops, MD;  Location: [place] NEURO ORS;  Service: Neurosurgery;  Laterality: N/A;  Cervical three to seven  posterior cervical fusion, Cervical four to seven Laminectomy, bone graft from right iliac crest   . POSTERIOR LUMBAR LAMINECTOMY AND DISKECTOMY  10/15/2009   L3 -- L4  . STENT REMOVAL  08/10/2017   Procedure: STENT REMOVAL;  Surgeon: Ceasar Mons, MD;  Location: [facility];  Service: Urology;;  . TONSILLECTOMY AND ADENOIDECTOMY  child  . TRANSTHORACIC ECHOCARDIOGRAM  11/27/2013   grade 1 diastolic dysfunction, ef 67-89%/  trivial AR and PR/ mild MR and TR/  PASP 40mmHg  . TRANSURETHRAL RESECTION OF PROSTATE N/A 05/31/2016   Procedure: TRANSURETHRAL RESECTION OF THE PROSTATE (TURP);  Surgeon: Carolan Clines, MD;  Location: [facility];  Service: Urology;  Laterality: N/A;   HPI:  Pt is a 79 yo male adm to [facility] with left lobe pna.  Pt PMH + for recent Parkinsons, CHF, GERD, chronic back pain - neck surgeries (ACDF - C3-C5, PCDF C3-C7),  COPD and recent decreased mobility and dysarthria over the last few months.  Left basilar ATX noted on CXR - Pt with hypoxic resp failure.   Assessment / Plan / Recommendation Clinical Impression  Pt is presenting with clinical indications of pharyngeal dysphagia with concern for aspiration c/b coughing post-swallow.  Pt is hyponasal and reports dysphagia since his multiple cervical spine surgeries.  He admits to coughing up food prior to admission at times and sensing residuals in pharynx. MBS indicated given pt's dysphagia, decreased po intake recently and to delineate dysphagia.  Pt agreeable to plan.  Marland Kitchen SLP Visit Diagnosis: Dysphagia, oropharyngeal phase (R13.12);Dysphagia, pharyngoesophageal phase (R13.14)    Aspiration Risk  Moderate aspiration risk    Diet  Recommendation   MBS at 1400 per xray, defer until after test       Other  Recommendations   tbd  Follow up Recommendations        Frequency and Duration   tbd         Prognosis   tbd     Swallow Study   General Date of Onset: 06/12/19 HPI: Pt is a 79 yo male adm to [facility] with left lobe pna.  Pt PMH + for recent Parkinsons, CHF, GERD, chronic back pain - neck surgeries (ACDF - C3-C5, PCDF C3-C7),  COPD and recent decreased mobility and dysarthria over the last few months.  Left basilar ATX noted on CXR - Pt with hypoxic resp failure. Type of Study: Bedside Swallow Evaluation Respiratory Status: Nasal cannula History of Recent Intubation: No Behavior/Cognition: Alert;Cooperative Oral Cavity Assessment: Within Functional Limits Oral Care Completed by SLP: No Oral Cavity - Dentition: Adequate natural dentition Vision: Functional for self-feeding Self-Feeding Abilities: Able to feed self Patient Positioning: Upright in bed Baseline Vocal Quality: Suspected CN X (Vagus) involvement;Other (comment)(low vocal intensity) Volitional Cough: Strong Volitional Swallow: Able to elicit    Oral/Motor/Sensory Function Overall Oral Motor/Sensory Function: Other (comment)(pt with hyponasal output)  Ice Chips Ice chips: Not tested   Thin Liquid      Nectar Thick     Honey Thick     Puree Puree: Within functional limits Presentation: Self Fed;Spoon   Solid     Solid: Impaired Presentation: Self Fed;Spoon Pharyngeal Phase Impairments: Cough - Delayed      Chales Abrahams 06/12/2019,1:00 PM  Rolena Infante, MS [facility] SLP Acute Rehab Services Office [phone]

## 2019-06-13 DIAGNOSIS — Z743 Need for continuous supervision: Secondary | ICD-10-CM | POA: Diagnosis not present

## 2019-06-13 DIAGNOSIS — S0990XA Unspecified injury of head, initial encounter: Secondary | ICD-10-CM | POA: Diagnosis not present

## 2019-06-13 DIAGNOSIS — N400 Enlarged prostate without lower urinary tract symptoms: Secondary | ICD-10-CM | POA: Diagnosis not present

## 2019-06-13 DIAGNOSIS — M542 Cervicalgia: Secondary | ICD-10-CM | POA: Diagnosis not present

## 2019-06-13 DIAGNOSIS — M25512 Pain in left shoulder: Secondary | ICD-10-CM | POA: Diagnosis not present

## 2019-06-13 DIAGNOSIS — I110 Hypertensive heart disease with heart failure: Secondary | ICD-10-CM | POA: Diagnosis not present

## 2019-06-13 DIAGNOSIS — I5032 Chronic diastolic (congestive) heart failure: Secondary | ICD-10-CM | POA: Diagnosis not present

## 2019-06-13 DIAGNOSIS — R05 Cough: Secondary | ICD-10-CM | POA: Diagnosis not present

## 2019-06-13 DIAGNOSIS — K219 Gastro-esophageal reflux disease without esophagitis: Secondary | ICD-10-CM | POA: Diagnosis not present

## 2019-06-13 DIAGNOSIS — S0003XA Contusion of scalp, initial encounter: Secondary | ICD-10-CM | POA: Diagnosis not present

## 2019-06-13 DIAGNOSIS — M79622 Pain in left upper arm: Secondary | ICD-10-CM | POA: Diagnosis not present

## 2019-06-13 DIAGNOSIS — Y92531 Health care provider office as the place of occurrence of the external cause: Secondary | ICD-10-CM | POA: Diagnosis not present

## 2019-06-13 DIAGNOSIS — R0989 Other specified symptoms and signs involving the circulatory and respiratory systems: Secondary | ICD-10-CM | POA: Diagnosis not present

## 2019-06-13 DIAGNOSIS — M6281 Muscle weakness (generalized): Secondary | ICD-10-CM | POA: Diagnosis not present

## 2019-06-13 DIAGNOSIS — N319 Neuromuscular dysfunction of bladder, unspecified: Secondary | ICD-10-CM | POA: Diagnosis not present

## 2019-06-13 DIAGNOSIS — M79602 Pain in left arm: Secondary | ICD-10-CM | POA: Diagnosis not present

## 2019-06-13 DIAGNOSIS — R488 Other symbolic dysfunctions: Secondary | ICD-10-CM | POA: Diagnosis not present

## 2019-06-13 DIAGNOSIS — Y9389 Activity, other specified: Secondary | ICD-10-CM | POA: Diagnosis not present

## 2019-06-13 DIAGNOSIS — J189 Pneumonia, unspecified organism: Secondary | ICD-10-CM | POA: Diagnosis not present

## 2019-06-13 DIAGNOSIS — R1310 Dysphagia, unspecified: Secondary | ICD-10-CM

## 2019-06-13 DIAGNOSIS — W19XXXA Unspecified fall, initial encounter: Secondary | ICD-10-CM | POA: Diagnosis not present

## 2019-06-13 DIAGNOSIS — W010XXA Fall on same level from slipping, tripping and stumbling without subsequent striking against object, initial encounter: Secondary | ICD-10-CM | POA: Diagnosis not present

## 2019-06-13 DIAGNOSIS — M109 Gout, unspecified: Secondary | ICD-10-CM | POA: Diagnosis not present

## 2019-06-13 DIAGNOSIS — G629 Polyneuropathy, unspecified: Secondary | ICD-10-CM | POA: Diagnosis not present

## 2019-06-13 DIAGNOSIS — Z79899 Other long term (current) drug therapy: Secondary | ICD-10-CM | POA: Diagnosis not present

## 2019-06-13 DIAGNOSIS — M2550 Pain in unspecified joint: Secondary | ICD-10-CM | POA: Diagnosis not present

## 2019-06-13 DIAGNOSIS — R1312 Dysphagia, oropharyngeal phase: Secondary | ICD-10-CM | POA: Diagnosis not present

## 2019-06-13 DIAGNOSIS — I4519 Other right bundle-branch block: Secondary | ICD-10-CM | POA: Diagnosis not present

## 2019-06-13 DIAGNOSIS — M25522 Pain in left elbow: Secondary | ICD-10-CM | POA: Diagnosis not present

## 2019-06-13 DIAGNOSIS — R262 Difficulty in walking, not elsewhere classified: Secondary | ICD-10-CM | POA: Diagnosis not present

## 2019-06-13 DIAGNOSIS — Z87891 Personal history of nicotine dependence: Secondary | ICD-10-CM | POA: Diagnosis not present

## 2019-06-13 DIAGNOSIS — J9601 Acute respiratory failure with hypoxia: Secondary | ICD-10-CM | POA: Diagnosis not present

## 2019-06-13 DIAGNOSIS — J449 Chronic obstructive pulmonary disease, unspecified: Secondary | ICD-10-CM | POA: Diagnosis not present

## 2019-06-13 DIAGNOSIS — G9009 Other idiopathic peripheral autonomic neuropathy: Secondary | ICD-10-CM | POA: Diagnosis not present

## 2019-06-13 DIAGNOSIS — E785 Hyperlipidemia, unspecified: Secondary | ICD-10-CM | POA: Diagnosis not present

## 2019-06-13 DIAGNOSIS — R269 Unspecified abnormalities of gait and mobility: Secondary | ICD-10-CM | POA: Diagnosis not present

## 2019-06-13 DIAGNOSIS — I959 Hypotension, unspecified: Secondary | ICD-10-CM | POA: Diagnosis not present

## 2019-06-13 DIAGNOSIS — J439 Emphysema, unspecified: Secondary | ICD-10-CM | POA: Diagnosis not present

## 2019-06-13 DIAGNOSIS — Z7401 Bed confinement status: Secondary | ICD-10-CM | POA: Diagnosis not present

## 2019-06-13 DIAGNOSIS — G20 Parkinson's disease: Secondary | ICD-10-CM | POA: Diagnosis not present

## 2019-06-13 DIAGNOSIS — Y999 Unspecified external cause status: Secondary | ICD-10-CM | POA: Diagnosis not present

## 2019-06-13 LAB — BASIC METABOLIC PANEL
Anion gap: 8 (ref 5–15)
BUN: 20 mg/dL (ref 8–23)
CO2: 21 mmol/L — ABNORMAL LOW (ref 22–32)
Calcium: 8.3 mg/dL — ABNORMAL LOW (ref 8.9–10.3)
Chloride: 109 mmol/L (ref 98–111)
Creatinine, Ser: 0.64 mg/dL (ref 0.61–1.24)
GFR calc Af Amer: >60 mL/min (ref >60)
GFR calc non Af Amer: >60 mL/min (ref >60)
Glucose, Bld: 118 mg/dL — ABNORMAL HIGH (ref 70–99)
Potassium: 3.9 mmol/L (ref 3.5–5.1)
Sodium: 138 mmol/L (ref 135–145)

## 2019-06-13 LAB — CBC
HCT: 35.6 % — ABNORMAL LOW (ref 39.0–52.0)
Hemoglobin: 11.1 g/dL — ABNORMAL LOW (ref 13.0–17.0)
MCH: 29.9 pg (ref 26.0–34.0)
MCHC: 31.2 g/dL (ref 30.0–36.0)
MCV: 96 fL (ref 80.0–100.0)
Platelets: 253 10*3/uL (ref 150–400)
RBC: 3.71 MIL/uL — ABNORMAL LOW (ref 4.22–5.81)
RDW: 13.2 % (ref 11.5–15.5)
WBC: 9.1 10*3/uL (ref 4.0–10.5)
nRBC: 0 % (ref 0.0–0.2)

## 2019-06-13 LAB — MAGNESIUM: Magnesium: 2 mg/dL (ref 1.7–2.4)

## 2019-06-13 MED ORDER — SERTRALINE HCL 50 MG PO TABS: 25.0000 mg | ORAL_TABLET | Freq: Every day | ORAL | 0 refills | Status: AC

## 2019-06-13 MED ORDER — OMEPRAZOLE 20 MG PO CPDR: 20.0000 mg | DELAYED_RELEASE_CAPSULE | Freq: Every morning | ORAL | 0 refills | Status: AC

## 2019-06-13 MED ORDER — ALLOPURINOL 100 MG PO TABS: 100.0000 mg | ORAL_TABLET | Freq: Every day | ORAL | 0 refills | Status: AC

## 2019-06-13 MED ORDER — POTASSIUM CHLORIDE ER 10 MEQ PO TBCR: 20.0000 meq | EXTENDED_RELEASE_TABLET | Freq: Every day | ORAL | 0 refills | Status: AC | PRN

## 2019-06-13 MED ORDER — ASPIRIN 325 MG PO TBEC: 325.0000 mg | DELAYED_RELEASE_TABLET | Freq: Three times a day (TID) | ORAL | 0 refills | Status: AC | PRN

## 2019-06-13 MED ORDER — FUROSEMIDE 20 MG PO TABS: 40.0000 mg | ORAL_TABLET | Freq: Every day | ORAL | 0 refills | Status: AC | PRN

## 2019-06-13 MED ORDER — ADULT MULTIVITAMIN W/MINERALS CH: 1.0000 | ORAL_TABLET | Freq: Every day | ORAL | 0 refills | Status: AC

## 2019-06-13 MED ORDER — ACETAMINOPHEN 325 MG PO TABS: 650.0000 mg | ORAL_TABLET | Freq: Four times a day (QID) | ORAL | 0 refills | Status: AC | PRN

## 2019-06-13 MED ORDER — ASCORBIC ACID 1000 MG PO TABS: 1000.0000 mg | ORAL_TABLET | Freq: Every day | ORAL | 0 refills | Status: AC

## 2019-06-13 MED ORDER — ALBUTEROL SULFATE HFA 108 (90 BASE) MCG/ACT IN AERS: 2.0000 | INHALATION_SPRAY | Freq: Four times a day (QID) | RESPIRATORY_TRACT | 0 refills | Status: AC | PRN

## 2019-06-13 MED ORDER — DIAZEPAM 5 MG PO TABS: 2.5000 mg | ORAL_TABLET | Freq: Three times a day (TID) | ORAL | 0 refills | Status: AC | PRN

## 2019-06-13 MED ORDER — BREO ELLIPTA 200-25 MCG/INH IN AEPB: 1.0000 | INHALATION_SPRAY | Freq: Every day | RESPIRATORY_TRACT | 0 refills | Status: AC

## 2019-06-13 MED ORDER — FLOVENT HFA 110 MCG/ACT IN AERO: 2.0000 | INHALATION_SPRAY | Freq: Two times a day (BID) | RESPIRATORY_TRACT | 0 refills | Status: AC | PRN

## 2019-06-13 MED ORDER — NIACIN 500 MG PO TABS: 500.0000 mg | ORAL_TABLET | Freq: Every day | ORAL | 0 refills | Status: AC

## 2019-06-13 MED ORDER — CEFDINIR 300 MG PO CAPS: 300.0000 mg | ORAL_CAPSULE | Freq: Two times a day (BID) | ORAL | 0 refills | Status: AC

## 2019-06-13 MED ORDER — TRAMADOL HCL 50 MG PO TABS: 50.0000 mg | ORAL_TABLET | Freq: Two times a day (BID) | ORAL | 0 refills | Status: AC | PRN

## 2019-06-13 MED ORDER — ATORVASTATIN CALCIUM 20 MG PO TABS: 20.0000 mg | ORAL_TABLET | Freq: Every day | ORAL | 0 refills | Status: AC

## 2019-06-13 MED ORDER — AZITHROMYCIN 500 MG PO TABS: 500.0000 mg | ORAL_TABLET | Freq: Every day | ORAL | 0 refills | Status: AC

## 2019-06-13 MED ORDER — RESOURCE THICKENUP CLEAR PO POWD: ORAL | 0 refills | Status: AC

## 2019-06-13 NOTE — TOC Transition Note (Signed)
Transition of Care [facility]) - CM/SW Discharge Note   Patient Details  Name: Evan [name] MRN: [id number] Date of Birth: 1940-05-08  Transition of Care [facility]) CM/SW Contact:  Bartholome Bill, RN Phone Number: 06/13/2019, 11:39 AM   Clinical Narrative:     Bed offers given to wife Lupita Leash. [facility] chosen. To DC there today. Pt to go to room 118p and will transport by [facility]. RN to call report to [phone]. Yellow DNR to go with pt to facility. DC summary sent via hub.  Final next level of care: Skilled Nursing Facility Barriers to Discharge: Continued Medical Work up   Patient Goals and CMS Choice Patient states their goals for this hospitalization and ongoing recovery are:: will need additional asssistance in the home CMS Medicare.gov Compare Post Acute Care list provided to:: Patient Represenative (must comment)(wife, Elpidio Anis) Choice offered to / list presented to : Spouse Discharge Plan and Services In-house Referral: Clinical Social Work Discharge Planning Services: CM Consult

## 2019-06-13 NOTE — Progress Notes (Signed)
  Speech Language Pathology Treatment: Dysphagia  Patient Details Name: Evan [name] MRN: [id number] DOB: 1941/04/15 Today's Date: 06/13/2019 Time: 4128-7867 SLP Time Calculation (min) (ACUTE ONLY): 12 min  Assessment / Plan / Recommendation Clinical Impression  SLP educated spouse to MBS using video and explained clinical reasoning for dietary consistencies and compenstion strategies and need for follow up SLP for dysphagia treatment at SNF.  Recommended use of puree to help transit oral residuals/retention of solids.  Given pt with significant pain negatively impacting ability to sit upright = advised pain medicine be timed with his meals to maximize his positioning with less pain.  Did not observe pt with po today as he is lethargic but education completed with wife.  Pt to dc to SNF today, please have follow up SLP for dysphagia management.    HPI HPI: Pt is a 79 yo male adm to [facility] with left lobe pna.  Pt PMH + for recent Parkinsons, CHF, GERD, chronic back pain - neck surgeries (ACDF - C3-C5, PCDF C3-C7),  COPD and recent decreased mobility and dysarthria over the last few months.  Left basilar ATX noted on CXR - Pt with hypoxic resp failure.Pt seen today to assess po tolerance and educate pt/spouse to rfindings of MBS.  Pt's wife Evan [name] states he choked last night severely with fish.  States today he did well with lunch.      SLP Plan  Continue with current plan of care       Recommendations  Diet recommendations: Dysphagia 3 (mechanical soft);Nectar-thick liquid(free water protocol) Medication Administration: Crushed with puree(crush if large) Compensations: Slow rate;Follow solids with liquid;Small sips/bites Postural Changes and/or Swallow Maneuvers: Seated upright 90 degrees;Upright 30-60 min after meal                Follow up Recommendations: Skilled Nursing facility SLP Visit Diagnosis: Dysphagia, oropharyngeal phase (R13.12) Plan: Continue with current plan of  care       GO                Chales Abrahams 06/13/2019, 1:07 PM   Rolena Infante, MS [facility] SLP Acute Rehab Services Office [phone]

## 2019-06-13 NOTE — Progress Notes (Signed)
Patient picked up by PATR.  Report provided to [facility] ([facility])  Patient not in distress AOX2 forgetful but otherwise in stable condition. Spouse at bedside

## 2019-06-13 NOTE — Discharge Summary (Signed)
Physician Discharge Summary  Evan [name] XVQ:[id number] DOB: 08/21/1940 DOA: 06/11/2019  PCP: Sharlene Dory, DO  Admit date: 06/11/2019 Discharge date: 06/13/2019  Admitted From: Home Disposition: [facility]  Recommendations for Outpatient Follow-up:  1. Follow up with PCP in 1-2 weeks 2. Continue antibiotics with azithromycin and cefdinir for left lower lobe pneumonia 3. May require additional help at home after short-term rehab versus may need assisted living  Home Health: No Equipment/Devices: None  Discharge Condition: Stable CODE STATUS: DNR Diet recommendation: Dysphagia 3 diet, ground meats, nectar thick liquids Diet Orders (From admission, onward)    Start     Ordered   06/13/19 0000  Diet - low sodium heart healthy     06/13/19 1101   06/12/19 1532  DIET DYS 3 Room service appropriate? Yes; Fluid consistency: Nectar Thick  Diet effective now    Comments: GROUND MEATS, EXTRA GRAVY/SAUCE  Question Answer Comment  Room service appropriate? Yes   Fluid consistency: Nectar Thick      06/12/19 1532          History of present illness:  Evan [name] is a 79 year old Caucasian gentleman with past medical history remarkable for chronic diastolic congestive heart failure, chronic back pain, HLD, anxiety, GERD, Parkinson's disease, BPH who presents to the ED with fever, progressive fatigue and weakness.  Poor historian at baseline, HPI gathered from ED reports and spouse.  Spouse reports he is unable to walk with multiple falls.  He has had a chronic cough secondary to his COPD and nonadherence with his home inhaler therapy but recently with new sputum production.  She also reports an elevated temperature of 99.9 F.  In the ED, temperature 98.3 F, HR 83, RR 18, BP 106/94, SPO2 99% on 2 L nasal cannula.  The BC count 11.3, chest x-ray with findings of left basilar airspace disease.  Patient was started on ceftriaxone and azithromycin and  the EDP referred for admission given acute hypoxic respiratory failure likely secondary to pneumonia in the setting of history of COPD.  Hospital course:  Acute respiratory failure with hypoxia; present on admission Left lower lobe pneumonia Patient presenting with progressive fatigue/weakness associated with temperature of 99.9 at home.  Also noted to be hypoxic, not on home O2.  Patient was found to have an elevated WBC count 11.3, chest x-ray findings with left basilar airspace disease consistent with pneumonia.  Patient was started on azithromycin and ceftriaxone.  Patient was able to be titrated off of supplemental oxygen.  Continue azithromycin and cefdinir following discharge.  Aspiration precautions secondary to his underlying dysphagia from Parkinson's disease.  Continue flutter valve, incentive spirometry.  History of COPD Not oxygen dependent at baseline.  Noted to be hypoxic on presentation.  Apparently has not been utilizing his home inhalers recently.  With treatment of his underlying pneumonia, his supplemental oxygen was titrated off.  Continue home Breo Elipta 200-44mcg 1 puff daily, fluticasone as needed and Duo nebs every 6 hours as needed wheezing/shortness of breath  Chronic diastolic congestive heart failure Appears compensated.  Chest x-ray with no findings of vascular congestion.  On furosemide 40 mg p.o. daily as needed for fluid/edema.  HLD: Continue atorvastatin 10 mg p.o. daily, niacin 500 mg  Anxiety/depression: Zoloft 25 mg p.o. daily, diazepam 2.5 mg every 8 hours as needed  GERD: Continue PPI  Parkinson's disease. Patient's mobility and speech pattern has been declining over last several months per spouse.  Roughly 2 weeks ago, at baseline  he was able to ambulate from his bed with the use of a wheelchair to his lift chair, now currently is unable to navigate from the bed to the wheelchair the past week.  Discharging to [facility] SNF.  May need further  assistance at home when ready for discharge versus assisted living.  Oral pharyngeal dysphagia Seen by speech therapy.  Underwent modified barium swallow on 06/12/2019.  Speech therapy recommends dysphagia 3 diet, ground meats and nectar thick liquids with meals.  Continue speech therapy efforts outpatient.  History of gout: Continue allopurinol 100 mg p.o. daily  Discharge Diagnoses:  Principal Problem:   Left lower lobe pneumonia Active Problems:   Pulmonary emphysema (HCC)   Diastolic dysfunction with heart failure (HCC)   Peripheral neuropathy   Gastroesophageal reflux disease without esophagitis   Depression with anxiety   BPH (benign prostatic hyperplasia)   Abnormality of gait   Chronic diastolic CHF (congestive heart failure) (HCC)   Parkinsonism (HCC)   Dysphagia    Discharge Instructions  Discharge Instructions    (HEART FAILURE PATIENTS) Call MD:  Anytime you have any of the following symptoms: 1) 3 pound weight gain in 24 hours or 5 pounds in 1 week 2) shortness of breath, with or without a dry hacking cough 3) swelling in the hands, feet or stomach 4) if you have to sleep on extra pillows at night in order to breathe.   Complete by: As directed    Call MD for:  difficulty breathing, headache or visual disturbances   Complete by: As directed    Call MD for:  extreme fatigue   Complete by: As directed    Call MD for:  persistant dizziness or light-headedness   Complete by: As directed    Call MD for:  persistant nausea and vomiting   Complete by: As directed    Call MD for:  severe uncontrolled pain   Complete by: As directed    Call MD for:  temperature >100.4   Complete by: As directed    Diet - low sodium heart healthy   Complete by: As directed    Increase activity slowly   Complete by: As directed      Allergies as of 06/13/2019      Reactions   Hydromorphone Itching   Oxycodone Itching, Nausea Only      Medication List    TAKE these medications    acetaminophen 325 MG tablet Commonly known as: TYLENOL Take 2 tablets (650 mg total) by mouth every 6 (six) hours as needed for mild pain, fever or headache. What changed:   medication strength  how much to take  reasons to take this   albuterol 108 (90 Base) MCG/ACT inhaler Commonly known as: VENTOLIN HFA Inhale 2 puffs into the lungs every 6 (six) hours as needed for wheezing or shortness of breath.   allopurinol 100 MG tablet Commonly known as: ZYLOPRIM Take 1 tablet (100 mg total) by mouth daily.   ascorbic acid 1000 MG tablet Commonly known as: VITAMIN C Take 1 tablet (1,000 mg total) by mouth daily.   aspirin 325 MG EC tablet Take 1 tablet (325 mg total) by mouth 3 (three) times daily as needed for pain.   atorvastatin 20 MG tablet Commonly known as: LIPITOR Take 1 tablet (20 mg total) by mouth daily.   azithromycin 500 MG tablet Commonly known as: Zithromax Take 1 tablet (500 mg total) by mouth daily for 3 days. Take 1 tablet daily for  3 days. Start taking on: June 14, 2019   AZO-CRANBERRY PO Take 1 tablet by mouth daily.   Breo Ellipta 200-25 MCG/INH Aepb Generic drug: fluticasone furoate-vilanterol Inhale 1 puff into the lungs daily. What changed: See the new instructions.   cefdinir 300 MG capsule Commonly known as: OMNICEF Take 1 capsule (300 mg total) by mouth 2 (two) times daily for 5 days.   colchicine 0.6 MG tablet TAKE 1 TABLET BY MOUTH EVERY 2 HOURS UNTIL RELIEF as needed What changed:   how much to take  how to take this  when to take this  additional instructions   diazepam 5 MG tablet Commonly known as: VALIUM Take 0.5 tablets (2.5 mg total) by mouth every 8 (eight) hours as needed for anxiety. What changed: medication strength   FISH OIL PO Take 1 capsule by mouth daily.   Flovent HFA 110 MCG/ACT inhaler Generic drug: fluticasone Inhale 2 puffs into the lungs 2 (two) times daily as needed (shortness of breath).    furosemide 20 MG tablet Commonly known as: LASIX Take 2 tablets (40 mg total) by mouth daily as needed for fluid or edema.   ipratropium-albuterol 0.5-2.5 (3) MG/3ML Soln Commonly known as: DUONEB Take 3 mLs by nebulization every 6 (six) hours as needed (wheezing/shortness of breath).   magnesium hydroxide 800 MG/5ML suspension Commonly known as: MILK OF MAGNESIA Take 30 mLs by mouth daily as needed for constipation.   multivitamin with minerals Tabs tablet Take 1 tablet by mouth daily.   niacin 500 MG tablet Take 1 tablet (500 mg total) by mouth daily.   omeprazole 20 MG capsule Commonly known as: PRILOSEC Take 1 capsule (20 mg total) by mouth every morning. What changed: See the new instructions.   potassium chloride 10 MEQ tablet Commonly known as: KLOR-CON Take 2 tablets (20 mEq total) by mouth daily as needed (Takes with every dose of furosemide).   Resource ThickenUp Clear Powd Use as directed to thicken up liquids for underlying dysphagia   sertraline 50 MG tablet Commonly known as: ZOLOFT Take 0.5 tablets (25 mg total) by mouth daily.   SYSTANE OP Place 2 drops into both eyes at bedtime as needed (For dry eyes.).   traMADol 50 MG tablet Commonly known as: ULTRAM Take 1 tablet (50 mg total) by mouth every 12 (twelve) hours as needed for severe pain.   trimethoprim 100 MG tablet Commonly known as: TRIMPEX Take 100 mg by mouth daily.      Follow-up Information    Sharlene Dory, DO. Schedule an appointment as soon as possible for a visit in 1 week(s).   Specialty: Family Medicine Contact information: [address] [place] [address] [phone]          Allergies  Allergen Reactions  . Hydromorphone Itching  . Oxycodone Itching and Nausea Only    Consultations:  None   Procedures/Studies: DG Chest 2 View  Result Date: 06/09/2019 CLINICAL DATA:  Fall with pain EXAM: CHEST - 2 VIEW COMPARISON:  Chest  radiograph dated 06/04/2018. FINDINGS: The heart size is within normal limits. Vascular calcifications are seen in the aortic arch. The lungs are mildly hyperinflated, likely reflecting emphysema. Both lungs are clear. Fixation hardware is seen in the lower cervical spine. IMPRESSION: No active cardiopulmonary disease. Electronically Signed   By: Romona Curls M.D.   On: 06/09/2019 16:17   DG Lumbar Spine Complete  Result Date: 06/09/2019 CLINICAL DATA:  79 year old male with fall  and back pain. EXAM: LUMBAR SPINE - COMPLETE 4+ VIEW COMPARISON:  Lumbar spine radiograph dated 12/10/2014. FINDINGS: Five lumbar type vertebra. No definite acute fracture identified. Evaluation for fracture however is very limited due to advanced osteopenia. L4-L5 posterior fusion and disc spacer noted. There is multilevel disc space narrowing most prominent at T12-L1. Multilevel facet arthropathy. There is advanced atherosclerotic calcification of the abdominal aorta. The aorta is ectatic measuring up to 2.6 cm in diameter. Right upper quadrant cholecystectomy clips. IMPRESSION: 1. No definite acute fracture or subluxation. Evaluation however is very limited due to advanced osteopenia. 2. Multilevel degenerative changes with lower lumbar fusion. 3. Advanced Aortic Atherosclerosis (ICD10-I70.0). Electronically Signed   By: Elgie CollardArash  Radparvar M.D.   On: 06/09/2019 16:10   DG Chest Port 1 View  Result Date: 06/11/2019 CLINICAL DATA:  Cough, fever and back pain. EXAM: PORTABLE CHEST 1 VIEW COMPARISON:  Single-view of the chest 06/09/2019. CT chest 01/11/2019. FINDINGS: The lungs are emphysematous. There is new airspace disease in the periphery of the left lower lobe. Right lung is clear. Atherosclerosis is noted. Heart size is normal. No pneumothorax. IMPRESSION: New left basilar airspace disease could be due to atelectasis or pneumonia. Emphysema. Atherosclerosis. Electronically Signed   By: Drusilla Kannerhomas  Dalessio M.D.   On: 06/11/2019  15:13   DG Knee Complete 4 Views Left  Result Date: 06/09/2019 CLINICAL DATA:  Fall with knee pain EXAM: LEFT KNEE - COMPLETE 4+ VIEW COMPARISON:  None. FINDINGS: No evidence of fracture, dislocation, or joint effusion. Mild patellofemoral joint osteoarthritis is noted. There is diffuse osseous demineralization. Soft tissues are unremarkable. IMPRESSION: No acute osseous abnormality. Electronically Signed   By: Romona Curlsyler  Litton M.D.   On: 06/09/2019 16:11   DG Swallowing Func-Speech Pathology  Result Date: 06/12/2019 Objective Swallowing Evaluation: Type of Study: MBS-Modified Barium Swallow Study  Patient Details Name: Darrelyn HillockRichard A Olano MRN: [id number] Date of Birth: August 14, 1940 Today's Date: 06/12/2019 Time: SLP Start Time (ACUTE ONLY): 1415 -SLP Stop Time (ACUTE ONLY): 1500 SLP Time Calculation (min) (ACUTE ONLY): 45 min Past Medical History: Past Medical History: Diagnosis Date . Abnormality of gait   multiple cervical spondylosis . Anxiety  . Aortic atherosclerosis (HCC)  . Bilateral leg weakness   due to back surgeries--  mostly uses wheelchair and at home very short distant w/ walker . Bilateral lower extremity edema  . BPH (benign prostatic hyperplasia)  . Cervical spondylosis without myelopathy   per dr cram (neuro surg.) . Chronic back pain  . Chronic bronchitis (HCC)  . Chronic gout   08-02-2017  per pt gout stable ,  last bout beginning March 2019 . Depression  . Diastolic CHF (HCC)  . Diverticulosis of colon  . Emphysema/COPD [facility](HCC) pulmologist-  dr Chilton Greathousepraveen mannam  last acute exacerbation 07-12-2017 (documented in epic) . Full dentures  . GERD (gastroesophageal reflux disease)  . Hard of hearing   does not wear hearing aides . Hemorrhoids  . History of sepsis   07-15-2017  urosepsis secondary to kidney stone extraction--  completed 14d IV vancomyocin  . Hyperlipidemia  . Nephrolithiasis   per CT 07-15-2017 multiple non-obstructive renal stones . Numbness   hands --- related to neck . OA  (osteoarthritis)   hands . Pressure ulcer of sacral region, stage 1   per pcp note in epic dated 07-29-2017 . RBBB (right bundle branch block)  . Right ureteral stone  . Self-catheterizes urinary bladder  Past Surgical History: Past Surgical History: Procedure Laterality Date . ABDOMINAL HERNIA REPAIR  1970's . ANTERIOR CERVICAL DECOMP/DISCECTOMY FUSION  07/03/2001  C4 -- C5/  post op Exploration and evacuation cervical hematoma . BLEPHAROPLASTY  2008 . CARDIOVASCULAR STRESS TEST  05/27/1998  normal nuclear study w/ no ischemia/  normal LV function and wall motion , ef 64% . CARPAL TUNNEL RELEASE Left 2003 approx. . CERVICAL FUSION  1995  C5 -- C6 . COLONOSCOPY WITH PROPOFOL N/A 02/10/2016  Procedure: COLONOSCOPY WITH PROPOFOL;  Surgeon: Hilarie Fredrickson, MD;  Location: [facility];  Service: Endoscopy;  Laterality: N/A; . CYSTO/  LEFT RETROGRADE PYELOGRAM/  BALLOON DILATION LEFT URETER/  URETEROSCOPY/  STENT PLACEMENT  07/28/2000 . CYSTOSCOPY W/ URETERAL STENT PLACEMENT Right 07/15/2017  Procedure: CYSTOSCOPY WITH RETROGRADE PYELOGRAM/URETERAL STENT PLACEMENT;  Surgeon: Ihor Gully, MD;  Location: [facility];  Service: Urology;  Laterality: Right; . CYSTOSCOPY/URETEROSCOPY/HOLMIUM LASER/STENT PLACEMENT Right 08/10/2017  Procedure: CYSTOSCOPY/URETEROSCOPY/HOLMIUM LASER/STENT PLACEMENT;  Surgeon: Rene Paci, MD;  Location: [facility];  Service: Urology;  Laterality: Right;  ONLY NEEDS 45 MIN FOR PROCEDURE . ESOPHAGOGASTRODUODENOSCOPY   . EXPLORATION AND RE-DO CERVICAL FUSION C4--5 AND ANTERIOR CERVIAL DISKECTOMY FUSION C3--4  08/09/2007  POST-OP EXPLORATION AND EVACUATION RETROPHARYNGEAL HEMATOMA . LAPAROSCOPIC CHOLECYSTECTOMY  1990'S . LUMBAR LAMINECTOMY/DECOMPRESSION MICRODISCECTOMY  10/29/2011  Procedure: LUMBAR LAMINECTOMY/DECOMPRESSION MICRODISCECTOMY 1 LEVEL;  Surgeon: Mariam Dollar, MD;  Location: [facility];  Service: Neurosurgery;  Laterality: Right;  Right Lumbar Three-Four Lumbar  Laminectomy/Microdiscectomy . MAXIMUM ACCESS (MAS)POSTERIOR LUMBAR INTERBODY FUSION (PLIF) 1 LEVEL N/A 07/22/2014  Procedure: FOR MAXIMUM ACCESS (MAS) POSTERIOR LUMBAR INTERBODY FUSION (PLIF) 1 LEVEL;  Surgeon: Donalee Citrin, MD;  Location: [facility];  Service: Neurosurgery;  Laterality: N/A;  FOR MAXIMUM ACCESS (MAS) POSTERIOR LUMBAR INTERBODY FUSION (PLIF) 1 LEVEL L4-5 . NEUROPLASTY / TRANSPOSITION ULNAR NERVE AT ELBOW Left 07/23/2008 . POSTERIOR CERVICAL FUSION/FORAMINOTOMY  06/09/2011  Procedure: POSTERIOR CERVICAL FUSION/FORAMINOTOMY LEVEL 4;  Surgeon: Mariam Dollar, MD;  Location: [facility];  Service: Neurosurgery;  Laterality: N/A;  Cervical three to seven  posterior cervical fusion, Cervical four to seven Laminectomy, bone graft from right iliac crest  . POSTERIOR LUMBAR LAMINECTOMY AND DISKECTOMY  10/15/2009  L3 -- L4 . STENT REMOVAL  08/10/2017  Procedure: STENT REMOVAL;  Surgeon: Rene Paci, MD;  Location: [facility];  Service: Urology;; . TONSILLECTOMY AND ADENOIDECTOMY  child . TRANSTHORACIC ECHOCARDIOGRAM  11/27/2013  grade 1 diastolic dysfunction, ef 50-55%/  trivial AR and PR/ mild MR and TR/  PASP . TRANSURETHRAL RESECTION OF PROSTATE N/A 05/31/2016  Procedure: TRANSURETHRAL RESECTION OF THE PROSTATE (TURP);  Surgeon: Jethro Bolus, MD;  Location: [facility]  Service: Urology;  Laterality: N/A; HPI: Pt is a 79 yo male adm [facility] with left lobe pna.  Pt PMH + for recent Parkinsons, CHF, GERD, chronic back pain - neck surgeries (ACDF - C3-C5, PCDF C3-C7),  COPD and recent decreased mobility and dysarthria over the last few months.  Left basilar ATX noted on CXR - Pt with hypoxic resp failure.  Subjective: pt sitting up in flouro chair Assessment / Plan / Recommendation CHL IP CLINICAL IMPRESSIONS 06/12/2019 Clinical Impression Pt presents with mild oropharyngeal dysphagia with sensorimotor deficits likely from his Parkinson's disease.  He  demonstrated delayed in oral transiting (up to 5 seconds with thin) with dis coordination/lingual pumping allowing premature spillage into pharynx. Anterior loss of thin barium noted due to decreased labial seal.   Mild thin residuals noted thin oral cavity which pt often swallow to clear but  trace retention remained posterior lateral sulci.   Pharyngeal swallow c/b decreased laryngeal closure/elevation and inconsistently impaired epiglottic deflection with thin and solid allowing pharyngeal retention and mild penetration of thin liquids.  Epiglottic deflection improved with puree bolus due to increased muscle recruitment likely.  Penetration of thin occurred during the swallow as barium transited into open larynx during swallow.  Cued cough did not fully clear penetrates and "Hock" did not clear pharynx/vallecular region. Advised pt work to strengthen these to improve airway protection.   Given pt also with difficulty controlling bolus amount and cohesion, nectar liquids advised at this time.  Barium tablet taken with pudding precariously lodged at vallecular region without pt awareness - further bolus of pudding facilitated clearance into esophagus. Given his lack of sensation to large amount of pharyngeal residuals, strict precautions advised.  Upon esophageal sweep, pt with only minimal residuals.   Recommend dys3/nectar, allowing tsp of thin, free water between meals after mouth care and medications crushed.  Using video monitor, educated pt to findings/recommendations during session. SLP Visit Diagnosis Dysphagia, oropharyngeal phase (R13.12) Attention and concentration deficit following -- Frontal lobe and executive function deficit following -- Impact on safety and function Moderate aspiration risk;Mild aspiration risk   CHL IP TREATMENT RECOMMENDATION 06/12/2019 Treatment Recommendations Therapy as outlined in treatment plan below   Prognosis 06/12/2019 Prognosis for Safe Diet Advancement Fair Barriers to  Reach Goals Time post onset Barriers/Prognosis Comment pt reports dysphagia since his last cervical spine surgery CHL IP DIET RECOMMENDATION 06/12/2019 SLP Diet Recommendations Dysphagia 3 (Mech soft) solids;Nectar thick liquid Liquid Administration via Cup;Straw Medication Administration Crushed with puree Compensations Slow rate;Follow solids with liquid;Small sips/bites Postural Changes --   CHL IP OTHER RECOMMENDATIONS 06/12/2019 Recommended Consults -- Oral Care Recommendations Oral care QID Other Recommendations --   CH[facility] IP FOLLOW UP RECOMMENDATIONS 06/12/2019 Follow up Recommendations Skilled Nursing facility   [facility] IP FREQUENCY AND DURATION 06/12/2019 Speech Therapy Frequency (ACUTE ONLY) min 2x/week Treatment Duration 2 weeks      CHL IP ORAL PHASE 06/12/2019 Oral Phase Impaired Oral - Pudding Teaspoon -- Oral - Pudding Cup -- Oral - Honey Teaspoon -- Oral - Honey Cup -- Oral - Nectar Teaspoon Decreased bolus cohesion;Delayed oral transit;Premature spillage Oral - Nectar Cup -- Oral - Nectar Straw Premature spillage;Decreased bolus cohesion;Delayed oral transit Oral - Thin Teaspoon Decreased bolus cohesion;Premature spillage;Delayed oral transit;Lingual/palatal residue Oral - Thin Cup Piecemeal swallowing;Delayed oral transit;Decreased bolus cohesion;Premature spillage;Lingual/palatal residue Oral - Thin Straw Decreased bolus cohesion;Premature spillage;Delayed oral transit;Lingual/palatal residue Oral - Puree WFL Oral - Mech Soft Delayed oral transit;Weak lingual manipulation Oral - Regular -- Oral - Multi-Consistency -- Oral - Pill Reduced posterior propulsion Oral Phase - Comment --  CHL IP PHARYNGEAL PHASE 06/12/2019 Pharyngeal Phase Impaired Pharyngeal- Pudding Teaspoon -- Pharyngeal -- Pharyngeal- Pudding Cup -- Pharyngeal -- Pharyngeal- Honey Teaspoon -- Pharyngeal -- Pharyngeal- Honey Cup -- Pharyngeal -- Pharyngeal- Nectar Teaspoon WFL Pharyngeal Material does not enter airway Pharyngeal- Nectar Cup [facility]  Pharyngeal Material does not enter airway Pharyngeal- Nectar Straw WFL Pharyngeal Material does not enter airway Pharyngeal- Thin Teaspoon Delayed swallow initiation-pyriform sinuses;WFL Pharyngeal Material does not enter airway Pharyngeal- Thin Cup Penetration/Aspiration during swallow;Reduced epiglottic inversion;Reduced airway/laryngeal closure;Reduced laryngeal elevation;Pharyngeal residue - valleculae Pharyngeal Material enters airway, remains ABOVE vocal cords and not ejected out Pharyngeal- Thin Straw Reduced epiglottic inversion;Reduced anterior laryngeal mobility;Reduced laryngeal elevation;Penetration/Aspiration during swallow;Pharyngeal residue - valleculae Pharyngeal Material enters airway, remains ABOVE vocal cords and not ejected out Pharyngeal- Puree WFL Pharyngeal  Material does not enter airway Pharyngeal- Mechanical Soft Reduced laryngeal elevation;Reduced airway/laryngeal closure;Pharyngeal residue - valleculae Pharyngeal Material does not enter airway Pharyngeal- Regular -- Pharyngeal -- Pharyngeal- Multi-consistency -- Pharyngeal -- Pharyngeal- Pill Pharyngeal residue - valleculae;Reduced epiglottic inversion Pharyngeal Material does not enter airway Pharyngeal Comment --  CHL IP CERVICAL ESOPHAGEAL PHASE 06/12/2019 Cervical Esophageal Phase WFL Pudding Teaspoon -- Pudding Cup -- Honey Teaspoon -- Honey Cup -- Nectar Teaspoon -- Nectar Cup -- Nectar Straw -- Thin Teaspoon -- Thin Cup -- Thin Straw -- Puree -- Mechanical Soft -- Regular -- Multi-consistency -- Pill -- Cervical Esophageal Comment -- Chales Abrahams 06/12/2019, 4:12 PM  Rolena Infante, MS [facility] SLP Acute Rehab Services Office [phone]             CT Renal Stone Study  Result Date: 06/09/2019 CLINICAL DATA:  Flank pain. EXAM: CT ABDOMEN AND PELVIS WITHOUT CONTRAST TECHNIQUE: Multidetector CT imaging of the abdomen and pelvis was performed following the standard protocol without IV contrast. COMPARISON:  CT abdomen pelvis dated  07/15/2017. FINDINGS: Lower chest: Mild bibasilar atelectasis is noted. Hepatobiliary: No focal liver abnormality is seen. Status post cholecystectomy. No biliary dilatation. Pancreas: There is fatty atrophy of the pancreas. Spleen: Normal in size without focal abnormality. Adrenals/Urinary Tract: Adrenal glands are unremarkable. Bilateral nonobstructive renal calculi measure up to 9 mm on the right and 5 mm on the left. The distal aspect of the right ureter near the ureterovesical junction is focally dilated which may reflect changes from prior obstruction in this location. No finding is identified at the ureterovesical junction to suggest ongoing obstruction. Left renal cysts measure up to 4.2 cm. There is no hydronephrosis on the left. The bladder is unremarkable. Stomach/Bowel: Stomach is within normal limits. Appendix appears normal. There is colonic diverticulosis without evidence of diverticulitis. No evidence of bowel wall thickening, distention, or inflammatory changes. Vascular/Lymphatic: Aortic atherosclerosis. No enlarged abdominal or pelvic lymph nodes. Reproductive: Prostate is unremarkable. Other: No abdominal wall hernia or abnormality. No abdominopelvic ascites. Musculoskeletal: No acute osseous findings. Fixation hardware is seen at L4-L5. IMPRESSION: 1. Focal dilatation of the distal right ureter near the ureterovesical junction may reflect changes from prior obstruction in this location. No finding is identified to suggest ongoing obstruction. 2. Bilateral nonobstructive renal calculi. No hydronephrosis on either side. Aortic Atherosclerosis (ICD10-I70.0). Electronically Signed   By: Romona Curls M.D.   On: 06/09/2019 16:42   DG HIPS BILAT WITH PELVIS MIN 5 VIEWS  Result Date: 06/09/2019 CLINICAL DATA:  Fall with left hip pain EXAM: DG HIP (WITH OR WITHOUT PELVIS) 5+V BILAT COMPARISON:  None. FINDINGS: There is no evidence of hip fracture or dislocation. 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 Mild degenerative changes are seen in both hips. Fixation hardware is seen in the lower lumbar spine. IMPRESSION: No acute fracture or dislocation. Electronically Signed   By: Romona Curls M.D.   On: 06/09/2019 16:12      Subjective: Patient seen and examined bedside, resting comfortably.  Now titrated off supplemental oxygen.  No specific complaints this morning, wishes he could go home but continues with significant weakness and debility.  Denies headache, no fever/chills/night sweats, no chest pain, palpitations, no shortness of breath, no abdominal pain.  No acute events overnight per nursing staff.  Discharge Exam: Vitals:   06/13/19 0546 06/13/19 0742  BP: (!) 109/56   Pulse: 81   Resp: 16   Temp: 98.4 F (36.9 C)   SpO2: 92% 92%   Vitals:  06/12/19 1959 06/13/19 0500 06/13/19 0546 06/13/19 0742  BP: 104/66  (!) 109/56   Pulse: 82  81   Resp: 18  16   Temp: 98.4 F (36.9 C)  98.4 F (36.9 C)   TempSrc: Axillary  Oral   SpO2: 92%  92% 92%  Weight:  86.5 kg    Height:        General: Pt is alert, awake, not in acute distress, difficult to understand with some garbled speech Cardiovascular: RRR, S1/S2 +, no rubs, no gallops Respiratory: CTA bilaterally, no wheezing, no rhonchi, oxygenating well on room air Abdominal: Soft, NT, ND, bowel sounds + Extremities: no edema, no cyanosis    The results of significant diagnostics from this hospitalization (including imaging, microbiology, ancillary and laboratory) are listed below for reference.     Microbiology: Recent Results (from the past 240 hour(s))  Urine culture     Status: Abnormal   Collection Time: 06/09/19  5:12 PM   Specimen: Urine, Random  Result Value Ref Range Status   Specimen Description   Final    URINE, RANDOM Performed at [facility], [address], [place], [place] [address]    Special Requests   Final    NONE Performed at [facility],  [address], [place], [place] [address]    Culture (A)  Final    <10,000 COLONIES/mL INSIGNIFICANT GROWTH Performed at [facility], [address], [place], [place] [address]    Report Status 06/11/2019 FINAL  Final  Respiratory Panel by RT PCR (Flu A&B, Covid) - Nasopharyngeal Swab     Status: None   Collection Time: 06/11/19  3:32 PM   Specimen: Nasopharyngeal Swab  Result Value Ref Range Status   SARS Coronavirus 2 by RT PCR NEGATIVE NEGATIVE Final    Comment: (NOTE) SARS-CoV-2 target nucleic acids are NOT DETECTED. The SARS-CoV-2 RNA is generally detectable in upper respiratoy specimens during the acute phase of infection. The lowest concentration of SARS-CoV-2 viral copies this assay can detect is 131 copies/mL. A negative result does not preclude SARS-Cov-2 infection and should not be used as the sole basis for treatment or other patient management decisions. A negative result may occur with  improper specimen collection/handling, submission of specimen other than nasopharyngeal swab, presence of viral mutation(s) within the areas targeted by this assay, and inadequate number of viral copies (<131 copies/mL). A negative result must be combined with clinical observations, patient history, and epidemiological information. The expected result is Negative. Fact Sheet for Patients:  https://www.moore.com/ Fact Sheet for Healthcare Providers:  https://www.young.biz/ This test is not yet ap proved or cleared by the [place] FDA and  has been authorized for detection and/or diagnosis of SARS-CoV-2 by FDA under an Emergency Use Authorization (EUA). This EUA will remain  in effect (meaning this test can be used) for the duration of the COVID-19 declaration under Section 564(b)(1) of the Act, 21 U.S.C. section 360bbb-3(b)(1), unless the authorization is terminated or revoked sooner.    Influenza A by PCR NEGATIVE NEGATIVE Final    Influenza B by PCR NEGATIVE NEGATIVE Final    Comment: (NOTE) The Xpert Xpress SARS-CoV-2/FLU/RSV assay is intended as an aid in  the diagnosis of influenza from Nasopharyngeal swab specimens and  should not be used as a sole basis for treatment. Nasal washings and  aspirates are unacceptable for Xpert Xpress SARS-CoV-2/FLU/RSV  testing. Fact Sheet for Patients: https://www.moore.com/ Fact Sheet for Healthcare Providers: https://www.young.biz/ This test  is not yet approved or cleared by the [place] FDA and  has been authorized for detection and/or diagnosis of SARS-CoV-2 by  FDA under an Emergency Use Authorization (EUA). This EUA will remain  in effect (meaning this test can be used) for the duration of the  Covid-19 declaration under Section 564(b)(1) of the Act, 21  U.S.C. section 360bbb-3(b)(1), unless the authorization is  terminated or revoked. Performed at [facility], [address] [place], [place] [address]   Culture, blood (routine x 2)     Status: None (Preliminary result)   Collection Time: 06/11/19  3:59 PM   Specimen: BLOOD  Result Value Ref Range Status   Specimen Description   Final    BLOOD LEFT ANTECUBITAL Performed at [facility], [address] [place], [place] [address]    Special Requests   Final    BOTTLES DRAWN AEROBIC AND ANAEROBIC Blood Culture adequate volume Performed at [facility], [address] [place], [place] [address]    Culture   Final    NO GROWTH < 12 HOURS Performed at [facility], [address] [place], [place] [address]    Report Status PENDING  Incomplete     Labs: BNP (last 3 results) No results for input(s): BNP in the last 8760 hours. Basic Metabolic Panel: Recent Labs  Lab 06/09/19 1624 06/11/19 1339 06/12/19 0432 06/13/19 0513  NA 139 140 139 138  K 4.3 4.1 4.2 3.9  CL 106 105 106 109  CO2 24 26 22  21*   GLUCOSE 102* 113* 108* 118*  BUN 18 18 20 20   CREATININE 0.73 0.78 0.68 0.64  CALCIUM 8.6* 8.9 8.7* 8.3*  MG  --   --   --  2.0   Liver Function Tests: Recent Labs  Lab 06/09/19 1624 06/11/19 1339 06/12/19 0432  AST 28 22 22   ALT 17 17 15   ALKPHOS 77 87 83  BILITOT 0.6 0.9 1.3*  PROT 6.9 7.3 7.1  ALBUMIN 3.3* 3.6 3.2*   Recent Labs  Lab 06/11/19 1339  LIPASE 13   No results for input(s): AMMONIA in the last 168 hours. CBC: Recent Labs  Lab 06/09/19 1624 06/11/19 1339 06/12/19 0432 06/13/19 0513  WBC 8.3 11.3* 9.8 9.1  NEUTROABS 5.8 8.8*  --   --   HGB 12.2* 13.0 11.8* 11.1*  HCT 37.7* 41.0 37.8* 35.6*  MCV 95.4 95.3 95.2 96.0  PLT 243 272 253 253   Cardiac Enzymes: No results for input(s): CKTOTAL, CKMB, CKMBINDEX, TROPONINI in the last 168 hours. BNP: Invalid input(s): POCBNP CBG: No results for input(s): GLUCAP in the last 168 hours. D-Dimer No results for input(s): DDIMER in the last 72 hours. Hgb A1c No results for input(s): HGBA1C in the last 72 hours. Lipid Profile No results for input(s): CHOL, HDL, LDLCALC, TRIG, CHOLHDL, LDLDIRECT in the last 72 hours. Thyroid function studies No results for input(s): TSH, T4TOTAL, T3FREE, THYROIDAB in the last 72 hours.  Invalid input(s): FREET3 Anemia work up No results for input(s): VITAMINB12, FOLATE, FERRITIN, TIBC, IRON, RETICCTPCT in the last 72 hours. Urinalysis    Component Value Date/Time   COLORURINE YELLOW 06/09/2019 1712   APPEARANCEUR CLEAR 06/09/2019 1712   LABSPEC 1.015 06/09/2019 1712   PHURINE 5.0 06/09/2019 1712   GLUCOSEU NEGATIVE 06/09/2019 1712   GLUCOSEU NEGATIVE 01/21/2015 1437   HGBUR NEGATIVE 06/09/2019 1712   BILIRUBINUR NEGATIVE 06/09/2019 1712   BILIRUBINUR neg 03/08/2016 1702  KETONESUR 5 (A) 06/09/2019 1712   PROTEINUR NEGATIVE 06/09/2019 1712   UROBILINOGEN 0.2 03/08/2016 1702   UROBILINOGEN 0.2 01/21/2015 1437   NITRITE NEGATIVE 06/09/2019 1712   LEUKOCYTESUR  MODERATE (A) 06/09/2019 1712   Sepsis Labs Invalid input(s): PROCALCITONIN,  WBC,  LACTICIDVEN Microbiology Recent Results (from the past 240 hour(s))  Urine culture     Status: Abnormal   Collection Time: 06/09/19  5:12 PM   Specimen: Urine, Random  Result Value Ref Range Status   Specimen Description   Final    URINE, RANDOM Performed at [facility] [place] [address] [place], [place] [address]    Special Requests   Final    NONE Performed at [facility], [place] [address], [place], [place] [address]    Culture (A)  Final    <10,000 COLONIES/mL INSIGNIFICANT GROWTH Performed at [facility] [address] [place], [place] [address]    Report Status 06/11/2019 FINAL  Final  Respiratory Panel by RT PCR (Flu A&B, Covid) - Nasopharyngeal Swab     Status: None   Collection Time: 06/11/19  3:32 PM   Specimen: Nasopharyngeal Swab  Result Value Ref Range Status   SARS Coronavirus 2 by RT PCR NEGATIVE NEGATIVE Final    Comment: (NOTE) SARS-CoV-2 target nucleic acids are NOT DETECTED. The SARS-CoV-2 RNA is generally detectable in upper respiratoy specimens during the acute phase of infection. The lowest concentration of SARS-CoV-2 viral copies this assay can detect is 131 copies/mL. A negative result does not preclude SARS-Cov-2 infection and should not be used as the sole basis for treatment or other patient management decisions. A negative result may occur with  improper specimen collection/handling, submission of specimen other than nasopharyngeal swab, presence of viral mutation(s) within the areas targeted by this assay, and inadequate number of viral copies (<131 copies/mL). A negative result must be combined with clinical observations, patient history, and epidemiological information. The expected result is Negative. Fact Sheet for Patients:  PinkCheek.be Fact Sheet for Healthcare Providers:   GravelBags.it This test is not yet ap proved or cleared by the [place] FDA and  has been authorized for detection and/or diagnosis of SARS-CoV-2 by FDA under an Emergency Use Authorization (EUA). This EUA will remain  in effect (meaning this test can be used) for the duration of the COVID-19 declaration under Section 564(b)(1) of the Act, 21 U.S.C. section 360bbb-3(b)(1), unless the authorization is terminated or revoked sooner.    Influenza A by PCR NEGATIVE NEGATIVE Final   Influenza B by PCR NEGATIVE NEGATIVE Final    Comment: (NOTE) The Xpert Xpress SARS-CoV-2/FLU/RSV assay is intended as an aid in  the diagnosis of influenza from Nasopharyngeal swab specimens and  should not be used as a sole basis for treatment. Nasal washings and  aspirates are unacceptable for Xpert Xpress SARS-CoV-2/FLU/RSV  testing. Fact Sheet for Patients: PinkCheek.be Fact Sheet for Healthcare Providers: GravelBags.it This test is not yet approved or cleared by the [place] FDA and  has been authorized for detection and/or diagnosis of SARS-CoV-2 by  FDA under an Emergency Use Authorization (EUA). This EUA will remain  in effect (meaning this test can be used) for the duration of the  Covid-19 declaration under Section 564(b)(1) of the Act, 21  U.S.C. section 360bbb-3(b)(1), unless the authorization is  terminated or revoked. Performed at [facility], [place] [address], [place], [place] [address]   Culture, blood (routine x 2)  Status: None (Preliminary result)   Collection Time: 06/11/19  3:59 PM   Specimen: BLOOD  Result Value Ref Range Status   Specimen Description   Final    BLOOD LEFT ANTECUBITAL Performed at [facility], [address], [place], [place] [address]    Special Requests   Final    BOTTLES DRAWN AEROBIC AND ANAEROBIC Blood Culture adequate  volume Performed at [facility], [address] [place], [place] [address]    Culture   Final    NO GROWTH < 12 HOURS Performed at [facility], [address], [place], [place] [address]    Report Status PENDING  Incomplete     Time coordinating discharge: Over 30 minutes  SIGNED:   Alvira Philips [place], DO  [facility] 06/13/2019, 11:02 AM

## 2019-06-13 NOTE — Consult Note (Signed)
WOC Nurse Consult Note: Reason for Consult: suspected deep tissue injury; sacrum Wound type: blanchable redness Pressure Injury POA: NA Measurement: area of concern is darker but not purple and it blanches  Wound bed: NA Drainage (amount, consistency, odor) none Periwound: intact  Dressing procedure/placement/frequency: Continue silicone prophylactic foam dressing to protect area. Turn Q 2 hours. NT and WOC nurse turned patient and provided CHG bath.   Discussed POC with patient and bedside nurse.  Re consult if needed, will not follow at this time. Thanks  Octavia Mottola [employer], RN,CWOCN, CNS, CWON-AP [phone])

## 2019-06-14 DIAGNOSIS — R1312 Dysphagia, oropharyngeal phase: Secondary | ICD-10-CM | POA: Diagnosis not present

## 2019-06-14 DIAGNOSIS — G629 Polyneuropathy, unspecified: Secondary | ICD-10-CM | POA: Diagnosis not present

## 2019-06-14 DIAGNOSIS — J439 Emphysema, unspecified: Secondary | ICD-10-CM | POA: Diagnosis not present

## 2019-06-14 LAB — CULTURE, BLOOD (ROUTINE X 2): Special Requests: ADEQUATE

## 2019-06-17 LAB — CULTURE, BLOOD (ROUTINE X 2)
Culture: NO GROWTH
Special Requests: ADEQUATE

## 2019-06-19 ENCOUNTER — Ambulatory Visit: Payer: Medicare Other | Admitting: Family Medicine

## 2019-06-20 DIAGNOSIS — R1312 Dysphagia, oropharyngeal phase: Secondary | ICD-10-CM | POA: Diagnosis not present

## 2019-06-20 DIAGNOSIS — R05 Cough: Secondary | ICD-10-CM | POA: Diagnosis not present

## 2019-06-20 DIAGNOSIS — G20 Parkinson's disease: Secondary | ICD-10-CM | POA: Diagnosis not present

## 2019-06-20 DIAGNOSIS — J189 Pneumonia, unspecified organism: Secondary | ICD-10-CM | POA: Diagnosis not present

## 2019-06-20 DIAGNOSIS — R0989 Other specified symptoms and signs involving the circulatory and respiratory systems: Secondary | ICD-10-CM | POA: Diagnosis not present

## 2019-06-21 DIAGNOSIS — W19XXXA Unspecified fall, initial encounter: Secondary | ICD-10-CM | POA: Diagnosis not present

## 2019-06-21 DIAGNOSIS — G20 Parkinson's disease: Secondary | ICD-10-CM | POA: Diagnosis not present

## 2019-06-21 DIAGNOSIS — M25512 Pain in left shoulder: Secondary | ICD-10-CM | POA: Diagnosis not present

## 2019-06-21 DIAGNOSIS — M79602 Pain in left arm: Secondary | ICD-10-CM | POA: Diagnosis not present

## 2019-06-21 DIAGNOSIS — M6281 Muscle weakness (generalized): Secondary | ICD-10-CM | POA: Diagnosis not present

## 2019-06-22 DIAGNOSIS — M79602 Pain in left arm: Secondary | ICD-10-CM | POA: Diagnosis not present

## 2019-06-22 DIAGNOSIS — M25512 Pain in left shoulder: Secondary | ICD-10-CM | POA: Diagnosis not present

## 2019-06-22 DIAGNOSIS — G20 Parkinson's disease: Secondary | ICD-10-CM | POA: Diagnosis not present

## 2019-06-24 ENCOUNTER — Encounter (HOSPITAL_COMMUNITY): Payer: Self-pay

## 2019-06-24 ENCOUNTER — Other Ambulatory Visit: Payer: Self-pay

## 2019-06-24 ENCOUNTER — Emergency Department (HOSPITAL_COMMUNITY): Payer: Medicare Other

## 2019-06-24 ENCOUNTER — Emergency Department (HOSPITAL_COMMUNITY)
Admission: EM | Admit: 2019-06-24 | Discharge: 2019-06-24 | Disposition: A | Discharge status: Home / Self Care | Payer: Medicare Other | Attending: Emergency Medicine | Admitting: Emergency Medicine

## 2019-06-24 DIAGNOSIS — M542 Cervicalgia: Secondary | ICD-10-CM | POA: Diagnosis not present

## 2019-06-24 DIAGNOSIS — W010XXA Fall on same level from slipping, tripping and stumbling without subsequent striking against object, initial encounter: Secondary | ICD-10-CM | POA: Insufficient documentation

## 2019-06-24 DIAGNOSIS — I110 Hypertensive heart disease with heart failure: Secondary | ICD-10-CM | POA: Diagnosis not present

## 2019-06-24 DIAGNOSIS — Z79899 Other long term (current) drug therapy: Secondary | ICD-10-CM | POA: Insufficient documentation

## 2019-06-24 DIAGNOSIS — W19XXXA Unspecified fall, initial encounter: Secondary | ICD-10-CM

## 2019-06-24 DIAGNOSIS — Y9389 Activity, other specified: Secondary | ICD-10-CM | POA: Insufficient documentation

## 2019-06-24 DIAGNOSIS — G20 Parkinson's disease: Secondary | ICD-10-CM | POA: Diagnosis not present

## 2019-06-24 DIAGNOSIS — I4519 Other right bundle-branch block: Secondary | ICD-10-CM | POA: Diagnosis not present

## 2019-06-24 DIAGNOSIS — Z87891 Personal history of nicotine dependence: Secondary | ICD-10-CM | POA: Insufficient documentation

## 2019-06-24 DIAGNOSIS — I5032 Chronic diastolic (congestive) heart failure: Secondary | ICD-10-CM | POA: Insufficient documentation

## 2019-06-24 DIAGNOSIS — S0003XA Contusion of scalp, initial encounter: Secondary | ICD-10-CM

## 2019-06-24 DIAGNOSIS — Y92531 Health care provider office as the place of occurrence of the external cause: Secondary | ICD-10-CM | POA: Diagnosis not present

## 2019-06-24 DIAGNOSIS — Y999 Unspecified external cause status: Secondary | ICD-10-CM | POA: Insufficient documentation

## 2019-06-24 DIAGNOSIS — S0990XA Unspecified injury of head, initial encounter: Secondary | ICD-10-CM | POA: Diagnosis not present

## 2019-06-24 LAB — CBG MONITORING, ED: Glucose-Capillary: 124 mg/dL — ABNORMAL HIGH (ref 70–99)

## 2019-06-24 MED ORDER — ACETAMINOPHEN 500 MG PO TABS
1000.0000 mg | ORAL_TABLET | Freq: Once | ORAL | Status: AC
  Administered 2019-06-24: 1000 mg via ORAL
  Filled 2019-06-24: qty 2

## 2019-06-24 NOTE — ED Notes (Signed)
PTAR at bedside 

## 2019-06-24 NOTE — ED Provider Notes (Signed)
[facility]-EMERGENCY DEPT Provider Note   CSN: [id number] Arrival date & time: 06/24/19  7741     History No chief complaint on file.   Evan [name] is a 79 y.o. male.  Pt is a 78y/o with hx of chronic diastolic congestive heart failure, chronic back pain, HLD, GERD, Parkinson's disease with recent hospitalization at the end of feb for PNA which may be from aspiration from his parkinson's presenting today from rehab after a fall.  Patient reports that he was trying to grab something off of a table and he had to reach too far causing him to fall hitting his head on the floor.  He reports that he did catch himself some with his hands and he did not lose consciousness.  He has some mild tenderness in the right side of his forehead and reports mild neck pain.  He denies significant tenderness to his arms or legs.  He has no chest pain, shortness of breath or abdominal pain.  Patient does not take anticoagulation.  The history is provided by the patient and the nursing home.       Past Medical History:  Diagnosis Date  . Abnormality of gait    multiple cervical spondylosis  . Anxiety   . Aortic atherosclerosis (HCC)   . Bilateral leg weakness    due to back surgeries--  mostly uses wheelchair and at home very short distant w/ walker  . Bilateral lower extremity edema   . BPH (benign prostatic hyperplasia)   . Cervical spondylosis without myelopathy    per dr cram (neuro surg.)  . Chronic back pain   . Chronic bronchitis (HCC)   . Chronic gout    08-02-2017  per pt gout stable ,  last bout beginning March 2019  . Depression   . Diastolic CHF (HCC)   . Diverticulosis of colon   . Emphysema/COPD [facility]) pulmologist-  dr Chilton Greathouse   last acute exacerbation 07-12-2017 (documented in epic)  . Full dentures   . GERD (gastroesophageal reflux disease)   . Hard of hearing    does not wear hearing aides  . Hemorrhoids   . History of sepsis    07-15-2017  urosepsis secondary to kidney stone extraction--  completed 14d IV vancomyocin   . Hyperlipidemia   . Nephrolithiasis    per CT 07-15-2017 multiple non-obstructive renal stones  . Numbness    hands --- related to neck  . OA (osteoarthritis)    hands  . Pressure ulcer of sacral region, stage 1    per pcp note in epic dated 07-29-2017  . RBBB (right bundle branch block)   . Right ureteral stone   . Self-catheterizes urinary bladder     Patient Active Problem List   Diagnosis Date Noted  . Dysphagia 06/13/2019  . Left lower lobe pneumonia 06/11/2019  . Aortic atherosclerosis (HCC)   . Epidermal cyst 01/16/2018  . Hearing loss of right ear 01/16/2018  . Pressure injury of right buttock, stage 2 (HCC) 08/08/2017  . Pressure ulcer of sacral region, stage 1 07/29/2017  . Sepsis (HCC) 07/15/2017  . Ureteropelvic junction (UPJ) obstruction   . Mixed simple and mucopurulent chronic bronchitis (HCC)   . Chronic bronchitis with emphysema (HCC) 05/31/2017  . Bilateral hearing loss 04/25/2017  . Parkinsonism (HCC) 09/03/2016  . Right leg weakness 09/03/2016  . Diverticulosis of colon with hemorrhage   . UTI (urinary tract infection) 12/09/2015  . Rectal bleed 12/07/2015  . Chronic  diastolic CHF (congestive heart failure) (HCC) 12/07/2015  . Rectal bleeding 12/07/2015  . Abnormality of gait 10/29/2015  . 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 Weakness of both legs 07/27/2015  . Shortness of breath on exertion 07/24/2015  . Tremor 07/24/2015  . Acute stress reaction 07/24/2015  . Screening, ischemic heart disease 01/29/2015  . Hernia of abdominal cavity 12/20/2014  . Gastroesophageal reflux disease without esophagitis 09/03/2014  . Depression with anxiety 09/03/2014  . BPH (benign prostatic hyperplasia) 09/03/2014  . Gout, chronic 09/03/2014  . Spondylolisthesis at L4-L5 level 07/22/2014  . Diastolic dysfunction with heart failure (HCC) 01/01/2014  .  Peripheral neuropathy 01/01/2014  . DOE (dyspnea on exertion) 11/15/2013  . Leg edema 11/15/2013  . Snoring 11/15/2013  . Pulmonary emphysema (HCC) 05/10/2013    Past Surgical History:  Procedure Laterality Date  . ABDOMINAL HERNIA REPAIR  1970's  . ANTERIOR CERVICAL DECOMP/DISCECTOMY FUSION  07/03/2001   C4 -- C5/  post op Exploration and evacuation cervical hematoma  . BLEPHAROPLASTY  2008  . CARDIOVASCULAR STRESS TEST  05/27/1998   normal nuclear study w/ no ischemia/  normal LV function and wall motion , ef 64%  . CARPAL TUNNEL RELEASE Left 2003 approx.  . CERVICAL FUSION  1995   C5 -- C6  . COLONOSCOPY WITH PROPOFOL N/A 02/10/2016   Procedure: COLONOSCOPY WITH PROPOFOL;  Surgeon: Hilarie Fredrickson, MD;  Location: [facility];  Service: Endoscopy;  Laterality: N/A;  . CYSTO/  LEFT RETROGRADE PYELOGRAM/  BALLOON DILATION LEFT URETER/  URETEROSCOPY/  STENT PLACEMENT  07/28/2000  . CYSTOSCOPY W/ URETERAL STENT PLACEMENT Right 07/15/2017   Procedure: CYSTOSCOPY WITH RETROGRADE PYELOGRAM/URETERAL STENT PLACEMENT;  Surgeon: Ihor Gully, MD;  Location: [facility];  Service: Urology;  Laterality: Right;  . CYSTOSCOPY/URETEROSCOPY/HOLMIUM LASER/STENT PLACEMENT Right 08/10/2017   Procedure: CYSTOSCOPY/URETEROSCOPY/HOLMIUM LASER/STENT PLACEMENT;  Surgeon: Rene Paci, MD;  Location: [facility];  Service: Urology;  Laterality: Right;  ONLY NEEDS 45 MIN FOR PROCEDURE  . ESOPHAGOGASTRODUODENOSCOPY    . EXPLORATION AND RE-DO CERVICAL FUSION C4--5 AND ANTERIOR CERVIAL DISKECTOMY FUSION C3--4  08/09/2007   POST-OP EXPLORATION AND EVACUATION RETROPHARYNGEAL HEMATOMA  . LAPAROSCOPIC CHOLECYSTECTOMY  1990'S  . LUMBAR LAMINECTOMY/DECOMPRESSION MICRODISCECTOMY  10/29/2011   Procedure: LUMBAR LAMINECTOMY/DECOMPRESSION MICRODISCECTOMY 1 LEVEL;  Surgeon: Mariam Dollar, MD;  Location: [facility];  Service: Neurosurgery;  Laterality: Right;  Right Lumbar Three-Four Lumbar  Laminectomy/Microdiscectomy  . MAXIMUM ACCESS (MAS)POSTERIOR LUMBAR INTERBODY FUSION (PLIF) 1 LEVEL N/A 07/22/2014   Procedure: FOR MAXIMUM ACCESS (MAS) POSTERIOR LUMBAR INTERBODY FUSION (PLIF) 1 LEVEL;  Surgeon: Donalee Citrin, MD;  Location: [facility];  Service: Neurosurgery;  Laterality: N/A;  FOR MAXIMUM ACCESS (MAS) POSTERIOR LUMBAR INTERBODY FUSION (PLIF) 1 LEVEL L4-5  . NEUROPLASTY / TRANSPOSITION ULNAR NERVE AT ELBOW Left 07/23/2008  . POSTERIOR CERVICAL FUSION/FORAMINOTOMY  06/09/2011   Procedure: POSTERIOR CERVICAL FUSION/FORAMINOTOMY LEVEL 4;  Surgeon: Mariam Dollar, MD;  Location: [facility];  Service: Neurosurgery;  Laterality: N/A;  Cervical three to seven  posterior cervical fusion, Cervical four to seven Laminectomy, bone graft from right iliac crest   . POSTERIOR LUMBAR LAMINECTOMY AND DISKECTOMY  10/15/2009   L3 -- L4  . STENT REMOVAL  08/10/2017   Procedure: STENT REMOVAL;  Surgeon: Rene Paci, MD;  Location: [facility];  Service: Urology;;  . TONSILLECTOMY AND ADENOIDECTOMY  child  . TRANSTHORACIC ECHOCARDIOGRAM  11/27/2013   grade 1 diastolic dysfunction, ef 50-55%/  trivial AR and PR/  mild MR and TR/  PASP  . TRANSURETHRAL RESECTION OF PROSTATE N/A 05/31/2016   Procedure: TRANSURETHRAL RESECTION OF THE PROSTATE (TURP);  Surgeon: Jethro Bolus, MD;  Location: [facility];  Service: Urology;  Laterality: N/A;       Family History  Problem Relation Age of Onset  . Diabetes Mother   . Parkinson's disease Father   . Heart disease Father     Social History   Tobacco Use  . Smoking status: Former Smoker    Packs/day: 1.50    Years: 55.00    Pack years: 82.50    Types: Cigarettes    Quit date: 05/27/2013    Years since quitting: 6.0  . Smokeless tobacco: Former Neurosurgeon    Types: Chew    Quit date: 02/01/2017  Substance Use Topics  . Alcohol use: No    Alcohol/week: 0.0 standard drinks  . Drug use: No    Home  Medications Prior to Admission medications   Medication Sig Start Date End Date Taking? Authorizing Provider  acetaminophen (TYLENOL) 325 MG tablet Take 2 tablets (650 mg total) by mouth every 6 (six) hours as needed for mild pain, fever or headache. 06/13/19   [place], Alvira Philips, DO  albuterol (VENTOLIN HFA) 108 (90 Base) MCG/ACT inhaler Inhale 2 puffs into the lungs every 6 (six) hours as needed for wheezing or shortness of breath. 06/13/19   [place], Alvira Philips, DO  allopurinol (ZYLOPRIM) 100 MG tablet Take 1 tablet (100 mg total) by mouth daily. 06/13/19 07/13/19  [place], Eric J, DO  ascorbic acid (VITAMIN C) 1000 MG tablet Take 1 tablet (1,000 mg total) by mouth daily. 06/13/19 07/13/19  [place], Eric J, DO  aspirin 325 MG EC tablet Take 1 tablet (325 mg total) by mouth 3 (three) times daily as needed for pain. 06/13/19 07/13/19  [place], Alvira Philips, DO  atorvastatin (LIPITOR) 20 MG tablet Take 1 tablet (20 mg total) by mouth daily. 06/13/19 07/13/19  [place], Eric J, DO  AZO-CRANBERRY PO Take 1 tablet by mouth daily.    [provider]  colchicine 0.6 MG tablet TAKE 1 TABLET BY MOUTH EVERY 2 HOURS UNTIL RELIEF as needed Patient taking differently: Take 0.6 mg by mouth See admin instructions. TAKE 1 TABLET BY MOUTH EVERY 2 HOURS UNTIL RELIEF as needed. For gout. 07/14/16   Sharlene Dory, DO  diazepam (VALIUM) 5 MG tablet Take 0.5 tablets (2.5 mg total) by mouth every 8 (eight) hours as needed for anxiety. 06/13/19 07/13/19  [place], Eric J, DO  fluticasone (FLOVENT HFA) 110 MCG/ACT inhaler Inhale 2 puffs into the lungs 2 (two) times daily as needed (shortness of breath). 06/13/19   [place], Eric J, DO  fluticasone furoate-vilanterol (BREO ELLIPTA) 200-25 MCG/INH AEPB Inhale 1 puff into the lungs daily. 06/13/19   [place], Alvira Philips, DO  furosemide (LASIX) 20 MG tablet Take 2 tablets (40 mg total) by mouth daily as needed for fluid or edema. 06/13/19 07/13/19  [place], Alvira Philips, DO  ipratropium-albuterol  (DUONEB) 0.5-2.5 (3) MG/3ML SOLN Take 3 mLs by nebulization every 6 (six) hours as needed (wheezing/shortness of breath). 12/27/18   Sharlene Dory, DO  magnesium hydroxide (MILK OF MAGNESIA) 800 MG/5ML suspension Take 30 mLs by mouth daily as needed for constipation.     [provider]  Maltodextrin-Xanthan Gum (RESOURCE THICKENUP CLEAR) POWD Use as directed to thicken up liquids for underlying dysphagia 06/13/19   [place], Alvira Philips, DO  Multiple Vitamin (MULTIVITAMIN WITH  MINERALS) TABS tablet Take 1 tablet by mouth daily. 06/13/19   [place], Alvira PhilipsEric J, DO  niacin 500 MG tablet Take 1 tablet (500 mg total) by mouth daily. 06/13/19 07/13/19  [place], Eric J, DO  Omega-3 Fatty Acids (FISH OIL PO) Take 1 capsule by mouth daily.     [provider]  omeprazole (PRILOSEC) 20 MG capsule Take 1 capsule (20 mg total) by mouth every morning. 06/13/19 07/13/19  [place], Alvira PhilipsEric J, DO  Polyethyl Glycol-Propyl Glycol (SYSTANE OP) Place 2 drops into both eyes at bedtime as needed (For dry eyes.).     [provider]  potassium chloride (KLOR-CON) 10 MEQ tablet Take 2 tablets (20 mEq total) by mouth daily as needed (Takes with every dose of furosemide). 06/13/19 07/13/19  [place], Alvira PhilipsEric J, DO  sertraline (ZOLOFT) 50 MG tablet Take 0.5 tablets (25 mg total) by mouth daily. 06/13/19 07/13/19  [place], Alvira PhilipsEric J, DO  traMADol (ULTRAM) 50 MG tablet Take 1 tablet (50 mg total) by mouth every 12 (twelve) hours as needed for severe pain. 06/13/19 07/13/19  [place], Alvira PhilipsEric J, DO  trimethoprim (TRIMPEX) 100 MG tablet Take 100 mg by mouth daily.    [provider]    Allergies    Hydromorphone and Oxycodone  Review of Systems   Review of Systems  All other systems reviewed and are negative.   Physical Exam Updated Vital Signs BP 112/77 (BP Location: Left Arm)   Pulse 95   Temp 97.8 F (36.6 C) (Oral)   Resp 17   Ht 5\' 5"  (1.651 m)   Wt 81.6 kg   SpO2 97%   BMI 29.95 kg/m   Physical  Exam Vitals and nursing note reviewed.  Constitutional:      General: He is not in acute distress.    Appearance: Normal appearance. He is well-developed.  HENT:     Head: Normocephalic and atraumatic.      Mouth/Throat:     Mouth: Mucous membranes are dry.  Eyes:     Conjunctiva/sclera: Conjunctivae normal.     Pupils: Pupils are equal, round, and reactive to light.  Neck:   Cardiovascular:     Rate and Rhythm: Normal rate and regular rhythm.     Heart sounds: No murmur.  Pulmonary:     Effort: Pulmonary effort is normal. No respiratory distress.     Breath sounds: Normal breath sounds. No wheezing or rales.  Abdominal:     General: There is no distension.     Palpations: Abdomen is soft.     Tenderness: There is no abdominal tenderness. There is no guarding or rebound.  Genitourinary:    Comments: Foley catheter in place draining urine that appears mildly bloody Musculoskeletal:        General: No tenderness. Normal range of motion.     Cervical back: Neck supple. Pain with movement and spinous process tenderness present. No muscular tenderness.     Right lower leg: No edema.     Left lower leg: No edema.     Comments: Diffuse ecchymosis over upper and lower extremities in various stages of healing.  However able to fully range all 4 extremities without significant tenderness  Skin:    General: Skin is warm and dry.     Findings: No erythema or rash.  Neurological:     Mental Status: He is alert. Mental status is at baseline.     Cranial Nerves: No cranial nerve deficit.     Sensory: No  sensory deficit.     Motor: No weakness.     Comments: Oriented to person and place.  Can have a conversation and follow commands  Psychiatric:        Mood and Affect: Mood normal.     ED Results / Procedures / Treatments   Labs (all labs ordered are listed, but only abnormal results are displayed) Labs Reviewed  CBG MONITORING, ED - Abnormal; Notable for the following components:        Result Value   Glucose-Capillary 124 (*)    All other components within normal limits    EKG None  Radiology CT Head Wo Contrast  Result Date: 06/24/2019 CLINICAL DATA:  79 year old male with history of fall.  Neck pain. EXAM: CT HEAD WITHOUT CONTRAST CT CERVICAL SPINE WITHOUT CONTRAST TECHNIQUE: Multidetector CT imaging of the head and cervical spine was performed following the standard protocol without intravenous contrast. Multiplanar CT image reconstructions of the cervical spine were also generated. COMPARISON:  None. FINDINGS: CT HEAD FINDINGS Brain: Cerebral atrophy with ex vacuo dilatation of the ventricular system (left greater than right). Patchy and confluent areas of decreased attenuation are noted throughout the deep and periventricular white matter of the cerebral hemispheres bilaterally, compatible with chronic microvascular ischemic disease. Well-defined areas of low attenuation in the basal ganglia bilaterally, compatible with old lacunar infarcts. Well-defined areas of low attenuation in the cerebellar hemispheres bilaterally, compatible with old infarctions. No evidence of acute infarction, hemorrhage, hydrocephalus, extra-axial collection or mass lesion/mass effect. Vascular: No hyperdense vessel or unexpected calcification. Skull: Normal. Negative for fracture or focal lesion. Sinuses/Orbits: No acute finding. Other: None. CT CERVICAL SPINE FINDINGS Alignment: Normal. Skull base and vertebrae: No acute fracture. No primary bone lesion or focal pathologic process. Anterior plate and screw fixation device extending from C3-C5. Posterior rod and screw fixation device extending from C3-C7. Soft tissues and spinal canal: No prevertebral fluid or swelling. No visible canal hematoma. Disc levels: Multilevel degenerative disc disease, most pronounced at C5-C6 (complete fusion) and C6-C7. Multilevel facet arthropathy. Upper chest: Emphysematous changes in the lung apices bilaterally.  Other: None. IMPRESSION: 1. No evidence of significant acute traumatic injury to the skull, brain or cervical spine. 2. 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 3. Multilevel degenerative disc disease and cervical spondylosis with postoperative changes in the cervical spine, as above. Electronically Signed   By: Trudie Reed M.D.   On: 06/24/2019 08:02   CT Cervical Spine Wo Contrast  Result Date: 06/24/2019 CLINICAL DATA:  79 year old male with history of fall.  Neck pain. EXAM: CT HEAD WITHOUT CONTRAST CT CERVICAL SPINE WITHOUT CONTRAST TECHNIQUE: Multidetector CT imaging of the head and cervical spine was performed following the standard protocol without intravenous contrast. Multiplanar CT image reconstructions of the cervical spine were also generated. COMPARISON:  None. FINDINGS: CT HEAD FINDINGS Brain: Cerebral atrophy with ex vacuo dilatation of the ventricular system (left greater than right). Patchy and confluent areas of decreased attenuation are noted throughout the deep and periventricular white matter of the cerebral hemispheres bilaterally, compatible with chronic microvascular ischemic disease. Well-defined areas of low attenuation in the basal ganglia bilaterally, compatible with old lacunar infarcts. Well-defined areas of low attenuation in the cerebellar hemispheres bilaterally, compatible with old infarctions. No evidence of acute infarction, hemorrhage, hydrocephalus, extra-axial collection or mass lesion/mass effect. Vascular: No hyperdense vessel or  unexpected calcification. Skull: Normal. Negative for fracture or focal lesion. Sinuses/Orbits: No acute finding. Other: None. CT CERVICAL SPINE FINDINGS Alignment: Normal. Skull base and vertebrae: No acute fracture. No primary bone lesion or focal pathologic process.  Anterior plate and screw fixation device extending from C3-C5. Posterior rod and screw fixation device extending from C3-C7. Soft tissues and spinal canal: No prevertebral fluid or swelling. No visible canal hematoma. Disc levels: Multilevel degenerative disc disease, most pronounced at C5-C6 (complete fusion) and C6-C7. Multilevel facet arthropathy. Upper chest: Emphysematous changes in the lung apices bilaterally. Other: None. IMPRESSION: 1. No evidence of significant acute traumatic injury to the skull, brain or cervical spine. 2. 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 3. Multilevel degenerative disc disease and cervical spondylosis with postoperative changes in the cervical spine, as above. Electronically Signed   By: Vinnie Langton M.D.   On: 06/24/2019 08:02    Procedures Procedures (including critical care time)  Medications Ordered in ED Medications  acetaminophen (TYLENOL) tablet 1,000 mg (has no administration in time range)    ED Course  I have reviewed the triage vital signs and the nursing notes.  Pertinent labs & imaging results that were available during my care of the patient were reviewed by me and considered in my medical decision making (see chart for details).    MDM Rules/Calculators/A&P                      Elderly male with a fall at nursing facility when he reports he was trying to grab something and could not reach it and fell.  Patient denies any dizziness, chest pain or shortness of breath.  He remembers the entire event and did not have loss of consciousness.  He has a small area of ecchymosis over the right forehead and some mild tenderness in the mid cervical spine.  He does have prior history of spinal surgery but cannot give further details.  Moving arms and legs appropriately without  notable neurologic compromise.  Low suspicion for acute bony injury of the upper or lower extremities.  Patient is not anticoagulated.  Vital signs are otherwise reassuring and he is in no acute distress.  Patient given Tylenol.  Head and cervical imaging is pending.  8:11 AM CT without acute findings.  Pt resting comfortably and feel pt is safe for d/c.  Final Clinical Impression(s) / ED Diagnoses Final diagnoses:  Fall, initial encounter  Contusion of scalp, initial encounter    Rx / DC Orders ED Discharge Orders    None       Blanchie Dessert, MD 06/24/19 [phone]

## 2019-06-24 NOTE — ED Notes (Addendum)
RN noticed that patient goes into vtach for a few seconds while sleeping but then quickly returns to normal sinus rhythm. Patient exhibiting no signs/symptoms of cardiac distress and vitals WNL. Dr. Anitra Lauth notified. Will continue to monitor.

## 2019-06-24 NOTE — Discharge Instructions (Signed)
CAT scans today without acute injury.

## 2019-06-24 NOTE — ED Notes (Signed)
[facility] called for transport at this time 

## 2019-06-24 NOTE — ED Triage Notes (Addendum)
From [facility] via EMS  Unwitnessed fall - patient told staff that he fell and his neck was hurting  Patient has recent surgery removing tumor in neck  Hx: dysphagia  128/90 89 HR 97% RA  Patient alert and oriented and has a urinary catheter with drainage bag upon assessment.

## 2019-06-27 DIAGNOSIS — N319 Neuromuscular dysfunction of bladder, unspecified: Secondary | ICD-10-CM | POA: Diagnosis not present

## 2019-06-27 DIAGNOSIS — G20 Parkinson's disease: Secondary | ICD-10-CM | POA: Diagnosis not present

## 2019-06-27 DIAGNOSIS — M6281 Muscle weakness (generalized): Secondary | ICD-10-CM | POA: Diagnosis not present

## 2019-07-02 ENCOUNTER — Other Ambulatory Visit: Payer: Self-pay

## 2019-07-02 ENCOUNTER — Emergency Department (HOSPITAL_COMMUNITY)
Admission: EM | Admit: 2019-07-02 | Discharge: 2019-07-02 | Disposition: A | Discharge status: Skilled Nursing Facility | Payer: Medicare Other | Attending: Emergency Medicine | Admitting: Emergency Medicine

## 2019-07-02 ENCOUNTER — Emergency Department (HOSPITAL_COMMUNITY): Payer: Medicare Other

## 2019-07-02 DIAGNOSIS — J189 Pneumonia, unspecified organism: Secondary | ICD-10-CM | POA: Diagnosis not present

## 2019-07-02 DIAGNOSIS — J449 Chronic obstructive pulmonary disease, unspecified: Secondary | ICD-10-CM | POA: Insufficient documentation

## 2019-07-02 DIAGNOSIS — R4182 Altered mental status, unspecified: Secondary | ICD-10-CM | POA: Diagnosis present

## 2019-07-02 DIAGNOSIS — R531 Weakness: Secondary | ICD-10-CM

## 2019-07-02 DIAGNOSIS — N390 Urinary tract infection, site not specified: Secondary | ICD-10-CM

## 2019-07-02 DIAGNOSIS — Z20822 Contact with and (suspected) exposure to covid-19: Secondary | ICD-10-CM | POA: Insufficient documentation

## 2019-07-02 DIAGNOSIS — R5383 Other fatigue: Secondary | ICD-10-CM | POA: Diagnosis not present

## 2019-07-02 DIAGNOSIS — R404 Transient alteration of awareness: Secondary | ICD-10-CM | POA: Diagnosis not present

## 2019-07-02 DIAGNOSIS — I5030 Unspecified diastolic (congestive) heart failure: Secondary | ICD-10-CM | POA: Diagnosis not present

## 2019-07-02 DIAGNOSIS — R5381 Other malaise: Secondary | ICD-10-CM | POA: Diagnosis not present

## 2019-07-02 DIAGNOSIS — R627 Adult failure to thrive: Secondary | ICD-10-CM | POA: Diagnosis not present

## 2019-07-02 DIAGNOSIS — Z743 Need for continuous supervision: Secondary | ICD-10-CM | POA: Diagnosis not present

## 2019-07-02 DIAGNOSIS — N401 Enlarged prostate with lower urinary tract symptoms: Secondary | ICD-10-CM | POA: Insufficient documentation

## 2019-07-02 DIAGNOSIS — R0902 Hypoxemia: Secondary | ICD-10-CM | POA: Diagnosis not present

## 2019-07-02 DIAGNOSIS — Z960 Presence of urogenital implants: Secondary | ICD-10-CM | POA: Diagnosis not present

## 2019-07-02 DIAGNOSIS — G20 Parkinson's disease: Secondary | ICD-10-CM | POA: Diagnosis not present

## 2019-07-02 DIAGNOSIS — Z7401 Bed confinement status: Secondary | ICD-10-CM | POA: Diagnosis not present

## 2019-07-02 DIAGNOSIS — M2550 Pain in unspecified joint: Secondary | ICD-10-CM | POA: Diagnosis not present

## 2019-07-02 LAB — URINALYSIS, ROUTINE W REFLEX MICROSCOPIC
Bilirubin Urine: NEGATIVE
Glucose, UA: NEGATIVE mg/dL
Hgb urine dipstick: NEGATIVE
Ketones, ur: 20 mg/dL — AB
Nitrite: NEGATIVE
Protein, ur: 30 mg/dL — AB
Specific Gravity, Urine: 1.019 (ref 1.005–1.030)
WBC, UA: >50 WBC/hpf — ABNORMAL HIGH (ref 0–5)
pH: 5 (ref 5.0–8.0)

## 2019-07-02 LAB — COMPREHENSIVE METABOLIC PANEL
ALT: 20 U/L (ref 0–44)
AST: 24 U/L (ref 15–41)
Albumin: 3.1 g/dL — ABNORMAL LOW (ref 3.5–5.0)
Alkaline Phosphatase: 84 U/L (ref 38–126)
Anion gap: 16 — ABNORMAL HIGH (ref 5–15)
BUN: 19 mg/dL (ref 8–23)
CO2: 20 mmol/L — ABNORMAL LOW (ref 22–32)
Calcium: 8.8 mg/dL — ABNORMAL LOW (ref 8.9–10.3)
Chloride: 100 mmol/L (ref 98–111)
Creatinine, Ser: 0.75 mg/dL (ref 0.61–1.24)
GFR calc Af Amer: >60 mL/min (ref >60)
GFR calc non Af Amer: >60 mL/min (ref >60)
Glucose, Bld: 105 mg/dL — ABNORMAL HIGH (ref 70–99)
Potassium: 4 mmol/L (ref 3.5–5.1)
Sodium: 136 mmol/L (ref 135–145)
Total Bilirubin: 0.8 mg/dL (ref 0.3–1.2)
Total Protein: 6.5 g/dL (ref 6.5–8.1)

## 2019-07-02 LAB — CBC
HCT: 40 % (ref 39.0–52.0)
Hemoglobin: 12.1 g/dL — ABNORMAL LOW (ref 13.0–17.0)
MCH: 29.5 pg (ref 26.0–34.0)
MCHC: 30.3 g/dL (ref 30.0–36.0)
MCV: 97.6 fL (ref 80.0–100.0)
Platelets: 280 10*3/uL (ref 150–400)
RBC: 4.1 MIL/uL — ABNORMAL LOW (ref 4.22–5.81)
RDW: 13.9 % (ref 11.5–15.5)
WBC: 8.2 10*3/uL (ref 4.0–10.5)
nRBC: 0 % (ref 0.0–0.2)

## 2019-07-02 LAB — SARS CORONAVIRUS 2 (TAT 6-24 HRS): SARS Coronavirus 2: NEGATIVE

## 2019-07-02 LAB — LACTIC ACID, PLASMA: Lactic Acid, Venous: 1.9 mmol/L (ref 0.5–1.9)

## 2019-07-02 MED ORDER — SODIUM CHLORIDE 0.9 % IV SOLN
2.0000 g | Freq: Once | INTRAVENOUS | Status: AC
  Administered 2019-07-02: 2 g via INTRAVENOUS
  Filled 2019-07-02: qty 2

## 2019-07-02 MED ORDER — CEPHALEXIN 500 MG PO CAPS: 500.0000 mg | ORAL_CAPSULE | Freq: Four times a day (QID) | ORAL | 0 refills | Status: AC

## 2019-07-02 MED ORDER — SODIUM CHLORIDE 0.9 % IV BOLUS
1000.0000 mL | Freq: Once | INTRAVENOUS | Status: AC
  Administered 2019-07-02: 15:00:00 1000 mL via INTRAVENOUS

## 2019-07-02 MED ORDER — VANCOMYCIN HCL IN DEXTROSE 1-5 GM/200ML-% IV SOLN
1000.0000 mg | Freq: Once | INTRAVENOUS | Status: AC
  Administered 2019-07-02: 1000 mg via INTRAVENOUS
  Filled 2019-07-02: qty 200

## 2019-07-02 MED ORDER — SODIUM CHLORIDE 0.9% FLUSH
3.0000 mL | Freq: Once | INTRAVENOUS | Status: AC
  Administered 2019-07-02: 3 mL via INTRAVENOUS

## 2019-07-02 NOTE — Progress Notes (Signed)
A consult was received from an ED physician for Vancomycin & Cefepime per pharmacy dosing.  The patient's profile has been reviewed for ht/wt/allergies/indication/available labs.    A one time order has been placed for Vancomycin 1gm & Cefepime 2gm.  Further antibiotics/pharmacy consults should be ordered by admitting physician if indicated.                       Thank you,  Otho Bellows PharmD 07/02/2019  3:06 PM

## 2019-07-02 NOTE — ED Notes (Signed)
[facility] called for transport.  

## 2019-07-02 NOTE — ED Provider Notes (Signed)
[facility]-EMERGENCY DEPT Provider Note   CSN: [id number] Arrival date & time: 07/02/19  1419     History Chief Complaint  Patient presents with  . Altered Mental Status    Evan [name] is a 79 y.o. male.  Patient arrives via EMS from [facility] with generalized weakness, and urine in foley that appears cloudy. ?hx uti. Patient very limited historian - level 5 caveat. Symptoms acute onset in past day, constant, moderate-sev, persistent. Pt denies any pain - no headache, no cp or discomfort, no abd pain. Denies cough or uri symptoms. No known covid exposure. Denies vomiting or diarrhea. No rash/skin lesions - pt does have minor, superficial sacral skin breakdown.   The history is provided by the patient and the EMS personnel. The history is limited by the condition of the patient.  Altered Mental Status Associated symptoms: weakness   Associated symptoms: no abdominal pain, no agitation, no fever, no headaches, no rash and no vomiting        Past Medical History:  Diagnosis Date  . Abnormality of gait    multiple cervical spondylosis  . Anxiety   . Aortic atherosclerosis (HCC)   . Bilateral leg weakness    due to back surgeries--  mostly uses wheelchair and at home very short distant w/ walker  . Bilateral lower extremity edema   . BPH (benign prostatic hyperplasia)   . Cervical spondylosis without myelopathy    per dr cram (neuro surg.)  . Chronic back pain   . Chronic bronchitis (HCC)   . Chronic gout    08-02-2017  per pt gout stable ,  last bout beginning March 2019  . Depression   . Diastolic CHF (HCC)   . Diverticulosis of colon   . Emphysema/COPD [facility]) pulmologist-  dr Chilton [place]athouse   last acute exacerbation 07-12-2017 (documented in epic)  . Full dentures   . GERD (gastroesophageal reflux disease)   . Hard of hearing    does not wear hearing aides  . Hemorrhoids   . History of sepsis    07-15-2017  urosepsis secondary to kidney  stone extraction--  completed 14d IV vancomyocin   . Hyperlipidemia   . Nephrolithiasis    per CT 07-15-2017 multiple non-obstructive renal stones  . Numbness    hands --- related to neck  . OA (osteoarthritis)    hands  . Pressure ulcer of sacral region, stage 1    per pcp note in epic dated 07-29-2017  . RBBB (right bundle branch block)   . Right ureteral stone   . Self-catheterizes urinary bladder     Patient Active Problem List   Diagnosis Date Noted  . Dysphagia 06/13/2019  . Left lower lobe pneumonia 06/11/2019  . Aortic atherosclerosis (HCC)   . Epidermal cyst 01/16/2018  . Hearing loss of right ear 01/16/2018  . Pressure injury of right buttock, stage 2 (HCC) 08/08/2017  . Pressure ulcer of sacral region, stage 1 07/29/2017  . Sepsis (HCC) 07/15/2017  . Ureteropelvic junction (UPJ) obstruction   . Mixed simple and mucopurulent chronic bronchitis (HCC)   . Chronic bronchitis with emphysema (HCC) 05/31/2017  . Bilateral hearing loss 04/25/2017  . Parkinsonism (HCC) 09/03/2016  . Right leg weakness 09/03/2016  . Diverticulosis of colon with hemorrhage   . UTI (urinary tract infection) 12/09/2015  . Rectal bleed 12/07/2015  . Chronic diastolic CHF (congestive heart failure) (HCC) 12/07/2015  . Rectal bleeding 12/07/2015  . Abnormality of gait 10/29/2015  .  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 Weakness of both legs 07/27/2015  . Shortness of breath on exertion 07/24/2015  . Tremor 07/24/2015  . Acute stress reaction 07/24/2015  . Screening, ischemic heart disease 01/29/2015  . Hernia of abdominal cavity 12/20/2014  . Gastroesophageal reflux disease without esophagitis 09/03/2014  . Depression with anxiety 09/03/2014  . BPH (benign prostatic hyperplasia) 09/03/2014  . Gout, chronic 09/03/2014  . Spondylolisthesis at L4-L5 level 07/22/2014  . Diastolic dysfunction with heart failure (HCC) 01/01/2014  . Peripheral neuropathy 01/01/2014  . DOE  (dyspnea on exertion) 11/15/2013  . Leg edema 11/15/2013  . Snoring 11/15/2013  . Pulmonary emphysema (HCC) 05/10/2013    Past Surgical History:  Procedure Laterality Date  . ABDOMINAL HERNIA REPAIR  1970's  . ANTERIOR CERVICAL DECOMP/DISCECTOMY FUSION  07/03/2001   C4 -- C5/  post op Exploration and evacuation cervical hematoma  . BLEPHAROPLASTY  2008  . CARDIOVASCULAR STRESS TEST  05/27/1998   normal nuclear study w/ no ischemia/  normal LV function and wall motion , ef 64%  . CARPAL TUNNEL RELEASE Left 2003 approx.  . CERVICAL FUSION  1995   C5 -- C6  . COLONOSCOPY WITH PROPOFOL N/A 02/10/2016   Procedure: COLONOSCOPY WITH PROPOFOL;  Surgeon: Hilarie Fredrickson, MD;  Location: [facility];  Service: Endoscopy;  Laterality: N/A;  . CYSTO/  LEFT RETROGRADE PYELOGRAM/  BALLOON DILATION LEFT URETER/  URETEROSCOPY/  STENT PLACEMENT  07/28/2000  . CYSTOSCOPY W/ URETERAL STENT PLACEMENT Right 07/15/2017   Procedure: CYSTOSCOPY WITH RETROGRADE PYELOGRAM/URETERAL STENT PLACEMENT;  Surgeon: Ihor Gully, MD;  Location: [facility];  Service: Urology;  Laterality: Right;  . CYSTOSCOPY/URETEROSCOPY/HOLMIUM LASER/STENT PLACEMENT Right 08/10/2017   Procedure: CYSTOSCOPY/URETEROSCOPY/HOLMIUM LASER/STENT PLACEMENT;  Surgeon: Rene Paci, MD;  Location: [facility];  Service: Urology;  Laterality: Right;  ONLY NEEDS 45 MIN FOR PROCEDURE  . ESOPHAGOGASTRODUODENOSCOPY    . EXPLORATION AND RE-DO CERVICAL FUSION C4--5 AND ANTERIOR CERVIAL DISKECTOMY FUSION C3--4  08/09/2007   POST-OP EXPLORATION AND EVACUATION RETROPHARYNGEAL HEMATOMA  . LAPAROSCOPIC CHOLECYSTECTOMY  1990'S  . LUMBAR LAMINECTOMY/DECOMPRESSION MICRODISCECTOMY  10/29/2011   Procedure: LUMBAR LAMINECTOMY/DECOMPRESSION MICRODISCECTOMY 1 LEVEL;  Surgeon: Mariam Dollar, MD;  Location: [facility];  Service: Neurosurgery;  Laterality: Right;  Right Lumbar Three-Four Lumbar Laminectomy/Microdiscectomy  . MAXIMUM ACCESS  (MAS)POSTERIOR LUMBAR INTERBODY FUSION (PLIF) 1 LEVEL N/A 07/22/2014   Procedure: FOR MAXIMUM ACCESS (MAS) POSTERIOR LUMBAR INTERBODY FUSION (PLIF) 1 LEVEL;  Surgeon: Donalee Citrin, MD;  Location: [facility];  Service: Neurosurgery;  Laterality: N/A;  FOR MAXIMUM ACCESS (MAS) POSTERIOR LUMBAR INTERBODY FUSION (PLIF) 1 LEVEL L4-5  . NEUROPLASTY / TRANSPOSITION ULNAR NERVE AT ELBOW Left 07/23/2008  . POSTERIOR CERVICAL FUSION/FORAMINOTOMY  06/09/2011   Procedure: POSTERIOR CERVICAL FUSION/FORAMINOTOMY LEVEL 4;  Surgeon: Mariam Dollar, MD;  Location: [facility];  Service: Neurosurgery;  Laterality: N/A;  Cervical three to seven  posterior cervical fusion, Cervical four to seven Laminectomy, bone graft from right iliac crest   . POSTERIOR LUMBAR LAMINECTOMY AND DISKECTOMY  10/15/2009   L3 -- L4  . STENT REMOVAL  08/10/2017   Procedure: STENT REMOVAL;  Surgeon: Rene Paci, MD;  Location: [facility];  Service: Urology;;  . TONSILLECTOMY AND ADENOIDECTOMY  child  . TRANSTHORACIC ECHOCARDIOGRAM  11/27/2013   grade 1 diastolic dysfunction, ef 50-55%/  trivial AR and PR/ mild MR and TR/  PASP  . TRANSURETHRAL RESECTION OF PROSTATE N/A 05/31/2016   Procedure: TRANSURETHRAL RESECTION  OF THE PROSTATE (TURP);  Surgeon: Jethro Bolus, MD;  Location: [facility];  Service: Urology;  Laterality: N/A;       Family History  Problem Relation Age of Onset  . Diabetes Mother   . Parkinson's disease Father   . Heart disease Father     Social History   Tobacco Use  . Smoking status: Former Smoker    Packs/day: 1.50    Years: 55.00    Pack years: 82.50    Types: Cigarettes    Quit date: 05/27/2013    Years since quitting: 6.1  . Smokeless tobacco: Former Neurosurgeon    Types: Chew    Quit date: 02/01/2017  Substance Use Topics  . Alcohol use: No    Alcohol/week: 0.0 standard drinks  . Drug use: No    Home Medications Prior to Admission medications     Medication Sig Start Date End Date Taking? Authorizing Provider  acetaminophen (TYLENOL) 325 MG tablet Take 2 tablets (650 mg total) by mouth every 6 (six) hours as needed for mild pain, fever or headache. 06/13/19  Yes [place], Eric J, DO  albuterol (VENTOLIN HFA) 108 (90 Base) MCG/ACT inhaler Inhale 2 puffs into the lungs every 6 (six) hours as needed for wheezing or shortness of breath. 06/13/19  Yes [place], Eric J, DO  allopurinol (ZYLOPRIM) 100 MG tablet Take 1 tablet (100 mg total) by mouth daily. 06/13/19 07/13/19 Yes [place], Eric J, DO  Amantadine HCl 100 MG tablet Take 50 mg by mouth daily.   Yes [provider]  ascorbic acid (VITAMIN C) 1000 MG tablet Take 1 tablet (1,000 mg total) by mouth daily. 06/13/19 07/13/19 Yes [place], Alvira Philips, DO  aspirin 325 MG EC tablet Take 1 tablet (325 mg total) by mouth 3 (three) times daily as needed for pain. 06/13/19 07/13/19 Yes [place], Eric J, DO  atorvastatin (LIPITOR) 20 MG tablet Take 1 tablet (20 mg total) by mouth daily. 06/13/19 07/13/19 Yes [place], Eric J, DO  AZO-CRANBERRY PO Take 425 mg by mouth daily.    Yes [provider]  colchicine 0.6 MG tablet TAKE 1 TABLET BY MOUTH EVERY 2 HOURS UNTIL RELIEF as needed Patient taking differently: Take 0.6 mg by mouth See admin instructions. Every 2 hours as needed for gout until relief 07/14/16  Yes Wendling, Jilda Roche, DO  fluticasone (FLOVENT HFA) 110 MCG/ACT inhaler Inhale 2 puffs into the lungs 2 (two) times daily as needed (shortness of breath). 06/13/19  Yes [place], Eric J, DO  fluticasone furoate-vilanterol (BREO ELLIPTA) 200-25 MCG/INH AEPB Inhale 1 puff into the lungs daily. 06/13/19  Yes [place], Eric J, DO  furosemide (LASIX) 20 MG tablet Take 2 tablets (40 mg total) by mouth daily as needed for fluid or edema. 06/13/19 07/13/19 Yes [place], Eric J, DO  ipratropium-albuterol (DUONEB) 0.5-2.5 (3) MG/3ML SOLN Take 3 mLs by nebulization every 6 (six) hours as needed  (wheezing/shortness of breath). 12/27/18  Yes Sharlene Dory, DO  magnesium hydroxide (MILK OF MAGNESIA) 800 MG/5ML suspension Take 30 mLs by mouth daily as needed for constipation.    Yes [provider]  Maltodextrin-Xanthan Gum (RESOURCE THICKENUP CLEAR) POWD Use as directed to thicken up liquids for underlying dysphagia 06/13/19  Yes [place], Alvira Philips, DO  Multiple Vitamin (MULTIVITAMIN WITH MINERALS) TABS tablet Take 1 tablet by mouth daily. 06/13/19  Yes [place], Alvira Philips, DO  niacin 500 MG tablet Take 1 tablet (500 mg total) by mouth daily. 06/13/19 07/13/19 Yes [place],  Alvira Philips, DO  Omega-3 Fatty Acids (FISH OIL PO) Take 1,000 mg by mouth daily.    Yes [provider]  omeprazole (PRILOSEC) 20 MG capsule Take 1 capsule (20 mg total) by mouth every morning. 06/13/19 07/13/19 Yes [place], Alvira Philips, DO  Polyethyl Glycol-Propyl Glycol (SYSTANE OP) Place 2 drops into both eyes at bedtime as needed (For dry eyes.).    Yes [provider]  potassium chloride (KLOR-CON) 10 MEQ tablet Take 2 tablets (20 mEq total) by mouth daily as needed (Takes with every dose of furosemide). 06/13/19 07/13/19 Yes [place], Eric J, DO  sertraline (ZOLOFT) 50 MG tablet Take 0.5 tablets (25 mg total) by mouth daily. 06/13/19 07/13/19 Yes [place], Eric J, DO  traMADol (ULTRAM) 50 MG tablet Take 1 tablet (50 mg total) by mouth every 12 (twelve) hours as needed for severe pain. 06/13/19 07/13/19 Yes [place], Eric J, DO  diazepam (VALIUM) 5 MG tablet Take 0.5 tablets (2.5 mg total) by mouth every 8 (eight) hours as needed for anxiety. Patient not taking: Reported on 07/02/2019 06/13/19 07/13/19  [place], Eric J, DO    Allergies    Hydromorphone and Oxycodone  Review of Systems   Review of Systems  Constitutional: Negative for fever.  HENT: Negative for sore throat.   Eyes: Negative for redness.  Respiratory: Negative for cough and shortness of breath.   Cardiovascular: Negative for chest pain.   Gastrointestinal: Negative for abdominal pain and vomiting.  Endocrine: Negative for polyuria.  Genitourinary: Negative for dysuria and flank pain.  Musculoskeletal: Negative for back pain and neck pain.  Skin: Negative for rash.  Neurological: Positive for weakness. Negative for numbness and headaches.  Hematological: Does not bruise/bleed easily.  Psychiatric/Behavioral: Negative for agitation.    Physical Exam Updated Vital Signs BP 110/69 (BP Location: Right Arm)   Pulse 79   Temp 98.1 F (36.7 C) (Oral)   Resp 17   SpO2 96%   Physical Exam Vitals and nursing note reviewed.  Constitutional:      Appearance: Normal appearance. He is well-developed.  HENT:     Head: Atraumatic.     Nose: Nose normal.     Mouth/Throat:     Mouth: Mucous membranes are moist.     Pharynx: Oropharynx is clear.  Eyes:     General: No scleral icterus.    Conjunctiva/sclera: Conjunctivae normal.     Pupils: Pupils are equal, round, and reactive to light.  Neck:     Trachea: No tracheal deviation.     Comments: No stiffness or rigidity.  Cardiovascular:     Rate and Rhythm: Normal rate and regular rhythm.     Pulses: Normal pulses.     Heart sounds: Normal heart sounds. No murmur. No friction rub. No gallop.   Pulmonary:     Effort: Pulmonary effort is normal. No accessory muscle usage or respiratory distress.     Breath sounds: Normal breath sounds.  Abdominal:     General: Bowel sounds are normal. There is no distension.     Palpations: Abdomen is soft.     Tenderness: There is no abdominal tenderness. There is no guarding.  Genitourinary:    Comments: No cva tenderness. Normal external gu exam. Foley cath in place, urine in tube very cloudy.  Musculoskeletal:        General: No swelling.     Cervical back: Normal range of motion and neck supple. No rigidity.  Skin:    General: Skin is warm  and dry.     Findings: No rash.     Comments: Very superficial skin breakdown sacral area,  without sign of infection.   Neurological:     Mental Status: He is alert.     Comments: Alert, speech clear. Motor/sens grossly intact bil.   Psychiatric:        Mood and Affect: Mood normal.     ED Results / Procedures / Treatments   Labs (all labs ordered are listed, but only abnormal results are displayed) Results for orders placed or performed during the hospital encounter of 07/02/19  Comprehensive metabolic panel  Result Value Ref Range   Sodium 136 135 - 145 mmol/L   Potassium 4.0 3.5 - 5.1 mmol/L   Chloride 100 98 - 111 mmol/L   CO2 20 (L) 22 - 32 mmol/L   Glucose, Bld 105 (H) 70 - 99 mg/dL   BUN 19 8 - 23 mg/dL   Creatinine, Ser 0.980.75 0.61 - 1.24 mg/dL   Calcium 8.8 (L) 8.9 - 10.3 mg/dL   Total Protein 6.5 6.5 - 8.1 g/dL   Albumin 3.1 (L) 3.5 - 5.0 g/dL   AST 24 15 - 41 U/L   ALT 20 0 - 44 U/L   Alkaline Phosphatase 84 38 - 126 U/L   Total Bilirubin 0.8 0.3 - 1.2 mg/dL   GFR calc non Af Amer >60 >60 mL/min   GFR calc Af Amer >60 >60 mL/min   Anion gap 16 (H) 5 - 15  CBC  Result Value Ref Range   WBC 8.2 4.0 - 10.5 K/uL   RBC 4.10 (L) 4.22 - 5.81 MIL/uL   Hemoglobin 12.1 (L) 13.0 - 17.0 g/dL   HCT 11.940.0 14.739.0 - 82.952.0 %   MCV 97.6 80.0 - 100.0 fL   MCH 29.5 26.0 - 34.0 pg   MCHC 30.3 30.0 - 36.0 g/dL   RDW 56.213.9 13.011.5 - 86.515.5 %   Platelets 280 150 - 400 K/uL   nRBC 0.0 0.0 - 0.2 %  Urinalysis, Routine w reflex microscopic  Result Value Ref Range   Color, Urine AMBER (A) YELLOW   APPearance TURBID (A) CLEAR   Specific Gravity, Urine 1.019 1.005 - 1.030   pH 5.0 5.0 - 8.0   Glucose, UA NEGATIVE NEGATIVE mg/dL   Hgb urine dipstick NEGATIVE NEGATIVE   Bilirubin Urine NEGATIVE NEGATIVE   Ketones, ur 20 (A) NEGATIVE mg/dL   Protein, ur 30 (A) NEGATIVE mg/dL   Nitrite NEGATIVE NEGATIVE   Leukocytes,Ua LARGE (A) NEGATIVE   WBC, UA >50 (H) 0 - 5 WBC/hpf   Bacteria, UA MANY (A) NONE SEEN   Squamous Epithelial / LPF 0-5 0 - 5   Mucus PRESENT    Budding Yeast PRESENT    Lactic acid, plasma  Result Value Ref Range   Lactic Acid, Venous 1.9 0.5 - 1.9 mmol/L   DG Chest 2 View  Result Date: 06/09/2019 CLINICAL DATA:  Fall with pain EXAM: CHEST - 2 VIEW COMPARISON:  Chest radiograph dated 06/04/2018. FINDINGS: The heart size is within normal limits. Vascular calcifications are seen in the aortic arch. The lungs are mildly hyperinflated, likely reflecting emphysema. Both lungs are clear. Fixation hardware is seen in the lower cervical spine. IMPRESSION: No active cardiopulmonary disease. Electronically Signed   By: Romona Curlsyler  Litton M.D.   On: 06/09/2019 16:17   DG Lumbar Spine Complete  Result Date: 06/09/2019 CLINICAL DATA:  79 year old male with fall and back pain. EXAM: LUMBAR SPINE -  COMPLETE 4+ VIEW COMPARISON:  Lumbar spine radiograph dated 12/10/2014. FINDINGS: Five lumbar type vertebra. No definite acute fracture identified. Evaluation for fracture however is very limited due to advanced osteopenia. L4-L5 posterior fusion and disc spacer noted. There is multilevel disc space narrowing most prominent at T12-L1. Multilevel facet arthropathy. There is advanced atherosclerotic calcification of the abdominal aorta. The aorta is ectatic measuring up to 2.6 cm in diameter. Right upper quadrant cholecystectomy clips. IMPRESSION: 1. No definite acute fracture or subluxation. Evaluation however is very limited due to advanced osteopenia. 2. Multilevel degenerative changes with lower lumbar fusion. 3. Advanced Aortic Atherosclerosis (ICD10-I70.0). Electronically Signed   By: Anner Crete M.D.   On: 06/09/2019 16:10   CT Head Wo Contrast  Result Date: 06/24/2019 CLINICAL DATA:  79 year old male with history of fall.  Neck pain. EXAM: CT HEAD WITHOUT CONTRAST CT CERVICAL SPINE WITHOUT CONTRAST TECHNIQUE: Multidetector CT imaging of the head and cervical spine was performed following the standard protocol without intravenous contrast. Multiplanar CT image reconstructions of  the cervical spine were also generated. COMPARISON:  None. FINDINGS: CT HEAD FINDINGS Brain: Cerebral atrophy with ex vacuo dilatation of the ventricular system (left greater than right). Patchy and confluent areas of decreased attenuation are noted throughout the deep and periventricular white matter of the cerebral hemispheres bilaterally, compatible with chronic microvascular ischemic disease. Well-defined areas of low attenuation in the basal ganglia bilaterally, compatible with old lacunar infarcts. Well-defined areas of low attenuation in the cerebellar hemispheres bilaterally, compatible with old infarctions. No evidence of acute infarction, hemorrhage, hydrocephalus, extra-axial collection or mass lesion/mass effect. Vascular: No hyperdense vessel or unexpected calcification. Skull: Normal. Negative for fracture or focal lesion. Sinuses/Orbits: No acute finding. Other: None. CT CERVICAL SPINE FINDINGS Alignment: Normal. Skull base and vertebrae: No acute fracture. No primary bone lesion or focal pathologic process. Anterior plate and screw fixation device extending from C3-C5. Posterior rod and screw fixation device extending from C3-C7. Soft tissues and spinal canal: No prevertebral fluid or swelling. No visible canal hematoma. Disc levels: Multilevel degenerative disc disease, most pronounced at C5-C6 (complete fusion) and C6-C7. Multilevel facet arthropathy. Upper chest: Emphysematous changes in the lung apices bilaterally. Other: None. IMPRESSION: 1. No evidence of significant acute traumatic injury to the skull, brain or cervical spine. 2. 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 3. Multilevel degenerative disc disease and cervical spondylosis with postoperative changes in the cervical spine, as above. Electronically Signed   By:  Vinnie Langton M.D.   On: 06/24/2019 08:02   CT Cervical Spine Wo Contrast  Result Date: 06/24/2019 CLINICAL DATA:  79 year old male with history of fall.  Neck pain. EXAM: CT HEAD WITHOUT CONTRAST CT CERVICAL SPINE WITHOUT CONTRAST TECHNIQUE: Multidetector CT imaging of the head and cervical spine was performed following the standard protocol without intravenous contrast. Multiplanar CT image reconstructions of the cervical spine were also generated. COMPARISON:  None. FINDINGS: CT HEAD FINDINGS Brain: Cerebral atrophy with ex vacuo dilatation of the ventricular system (left greater than right). Patchy and confluent areas of decreased attenuation are noted throughout the deep and periventricular white matter of the cerebral hemispheres bilaterally, compatible with chronic microvascular ischemic disease. Well-defined areas of low attenuation in the basal ganglia bilaterally, compatible with old lacunar infarcts. Well-defined areas of low attenuation in the cerebellar hemispheres bilaterally, compatible with old infarctions. No evidence of  acute infarction, hemorrhage, hydrocephalus, extra-axial collection or mass lesion/mass effect. Vascular: No hyperdense vessel or unexpected calcification. Skull: Normal. Negative for fracture or focal lesion. Sinuses/Orbits: No acute finding. Other: None. CT CERVICAL SPINE FINDINGS Alignment: Normal. Skull base and vertebrae: No acute fracture. No primary bone lesion or focal pathologic process. Anterior plate and screw fixation device extending from C3-C5. Posterior rod and screw fixation device extending from C3-C7. Soft tissues and spinal canal: No prevertebral fluid or swelling. No visible canal hematoma. Disc levels: Multilevel degenerative disc disease, most pronounced at C5-C6 (complete fusion) and C6-C7. Multilevel facet arthropathy. Upper chest: Emphysematous changes in the lung apices bilaterally. Other: None. IMPRESSION: 1. No evidence of significant acute  traumatic injury to the skull, brain or cervical spine. 2. 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 3. Multilevel degenerative disc disease and cervical spondylosis with postoperative changes in the cervical spine, as above. Electronically Signed   By: Trudie Reed M.D.   On: 06/24/2019 08:02   DG Chest Port 1 View  Result Date: 07/02/2019 CLINICAL DATA:  Weakness EXAM: PORTABLE CHEST 1 VIEW COMPARISON:  06/11/2019 FINDINGS: Right costophrenic angle is excluded. Portion of the extreme right apex is excluded. Emphysema with chronic interstitial prominence. Possible patchy increased density in the right lung. Stable elevation the left hemidiaphragm. Small left pleural effusion or pleural thickening. No pneumothorax. Heart size is likely within normal limits for portable technique. Probable hiatal hernia. There is residual contrast within the colon. IMPRESSION: Possible patchy increased density in the right lung superimposed on chronic bilateral interstitial prominence. This could reflect asymmetric edema or atypical pneumonia in the appropriate setting. Small left pleural effusion or pleural thickening. Electronically Signed   By: Guadlupe Spanish M.D.   On: 07/02/2019 15:20   DG Chest Port 1 View  Result Date: 06/11/2019 CLINICAL DATA:  Cough, fever and back pain. EXAM: PORTABLE CHEST 1 VIEW COMPARISON:  Single-view of the chest 06/09/2019. CT chest 01/11/2019. FINDINGS: The lungs are emphysematous. There is new airspace disease in the periphery of the left lower lobe. Right lung is clear. Atherosclerosis is noted. Heart size is normal. No pneumothorax. IMPRESSION: New left basilar airspace disease could be due to atelectasis or pneumonia. Emphysema. Atherosclerosis. Electronically Signed   By: Drusilla Kanner M.D.   On: 06/11/2019  15:13   DG Knee Complete 4 Views Left  Result Date: 06/09/2019 CLINICAL DATA:  Fall with knee pain EXAM: LEFT KNEE - COMPLETE 4+ VIEW COMPARISON:  None. FINDINGS: No evidence of fracture, dislocation, or joint effusion. Mild patellofemoral joint osteoarthritis is noted. There is diffuse osseous demineralization. Soft tissues are unremarkable. IMPRESSION: No acute osseous abnormality. Electronically Signed   By: Romona Curls M.D.   On: 06/09/2019 16:11   DG Swallowing Func-Speech Pathology  Result Date: 06/12/2019 Objective Swallowing Evaluation: Type of Study: MBS-Modified Barium Swallow Study  Patient Details Name: KALI AMBLER MRN: [id number] Date of Birth: 01/31/1941 Today's Date: 06/12/2019 Time: SLP Start Time (ACUTE ONLY): 1415 -SLP Stop Time (ACUTE ONLY): 1500 SLP Time Calculation (min) (ACUTE ONLY): 45 min Past Medical History: Past Medical History: Diagnosis Date . Abnormality of gait   multiple cervical spondylosis . Anxiety  . Aortic atherosclerosis (HCC)  . Bilateral leg weakness   due to back surgeries--  mostly uses wheelchair and at home very short distant w/ walker . Bilateral lower extremity edema  .  BPH (benign prostatic hyperplasia)  . Cervical spondylosis without myelopathy   per dr cram (neuro surg.) . Chronic back pain  . Chronic bronchitis (HCC)  . Chronic gout   08-02-2017  per pt gout stable ,  last bout beginning March 2019 . Depression  . Diastolic CHF (HCC)  . Diverticulosis of colon  . Emphysema/COPD [facility]) pulmologist-  dr Chilton Greathouse  last acute exacerbation 07-12-2017 (documented in epic) . Full dentures  . GERD (gastroesophageal reflux disease)  . Hard of hearing   does not wear hearing aides . Hemorrhoids  . History of sepsis   07-15-2017  urosepsis secondary to kidney stone extraction--  completed 14d IV vancomyocin  . Hyperlipidemia  . Nephrolithiasis   per CT 07-15-2017 multiple non-obstructive renal stones . Numbness   hands --- related to neck . OA  (osteoarthritis)   hands . Pressure ulcer of sacral region, stage 1   per pcp note in epic dated 07-29-2017 . RBBB (right bundle branch block)  . Right ureteral stone  . Self-catheterizes urinary bladder  Past Surgical History: Past Surgical History: Procedure Laterality Date . ABDOMINAL HERNIA REPAIR  1970's . ANTERIOR CERVICAL DECOMP/DISCECTOMY FUSION  07/03/2001  C4 -- C5/  post op Exploration and evacuation cervical hematoma . BLEPHAROPLASTY  2008 . CARDIOVASCULAR STRESS TEST  05/27/1998  normal nuclear study w/ no ischemia/  normal LV function and wall motion , ef 64% . CARPAL TUNNEL RELEASE Left 2003 approx. . CERVICAL FUSION  1995  C5 -- C6 . COLONOSCOPY WITH PROPOFOL N/A 02/10/2016  Procedure: COLONOSCOPY WITH PROPOFOL;  Surgeon: Hilarie Fredrickson, MD;  Location: [facility];  Service: Endoscopy;  Laterality: N/A; . CYSTO/  LEFT RETROGRADE PYELOGRAM/  BALLOON DILATION LEFT URETER/  URETEROSCOPY/  STENT PLACEMENT  07/28/2000 . CYSTOSCOPY W/ URETERAL STENT PLACEMENT Right 07/15/2017  Procedure: CYSTOSCOPY WITH RETROGRADE PYELOGRAM/URETERAL STENT PLACEMENT;  Surgeon: Ihor Gully, MD;  Location: [facility];  Service: Urology;  Laterality: Right; . CYSTOSCOPY/URETEROSCOPY/HOLMIUM LASER/STENT PLACEMENT Right 08/10/2017  Procedure: CYSTOSCOPY/URETEROSCOPY/HOLMIUM LASER/STENT PLACEMENT;  Surgeon: Rene Paci, MD;  Location: [facility];  Service: Urology;  Laterality: Right;  ONLY NEEDS 45 MIN FOR PROCEDURE . ESOPHAGOGASTRODUODENOSCOPY   . EXPLORATION AND RE-DO CERVICAL FUSION C4--5 AND ANTERIOR CERVIAL DISKECTOMY FUSION C3--4  08/09/2007  POST-OP EXPLORATION AND EVACUATION RETROPHARYNGEAL HEMATOMA . LAPAROSCOPIC CHOLECYSTECTOMY  1990'S . LUMBAR LAMINECTOMY/DECOMPRESSION MICRODISCECTOMY  10/29/2011  Procedure: LUMBAR LAMINECTOMY/DECOMPRESSION MICRODISCECTOMY 1 LEVEL;  Surgeon: Mariam Dollar, MD;  Location: [facility];  Service: Neurosurgery;  Laterality: Right;  Right Lumbar Three-Four Lumbar  Laminectomy/Microdiscectomy . MAXIMUM ACCESS (MAS)POSTERIOR LUMBAR INTERBODY FUSION (PLIF) 1 LEVEL N/A 07/22/2014  Procedure: FOR MAXIMUM ACCESS (MAS) POSTERIOR LUMBAR INTERBODY FUSION (PLIF) 1 LEVEL;  Surgeon: Donalee Citrin, MD;  Location: [facility];  Service: Neurosurgery;  Laterality: N/A;  FOR MAXIMUM ACCESS (MAS) POSTERIOR LUMBAR INTERBODY FUSION (PLIF) 1 LEVEL L4-5 . NEUROPLASTY / TRANSPOSITION ULNAR NERVE AT ELBOW Left 07/23/2008 . POSTERIOR CERVICAL FUSION/FORAMINOTOMY  06/09/2011  Procedure: POSTERIOR CERVICAL FUSION/FORAMINOTOMY LEVEL 4;  Surgeon: Mariam Dollar, MD;  Location: [facility];  Service: Neurosurgery;  Laterality: N/A;  Cervical three to seven  posterior cervical fusion, Cervical four to seven Laminectomy, bone graft from right iliac crest  . POSTERIOR LUMBAR LAMINECTOMY AND DISKECTOMY  10/15/2009  L3 -- L4 . STENT REMOVAL  08/10/2017  Procedure: STENT REMOVAL;  Surgeon: Rene Paci, MD;  Location: [facility];  Service: Urology;; . TONSILLECTOMY AND ADENOIDECTOMY  child . TRANSTHORACIC ECHOCARDIOGRAM  11/27/2013  grade 1 diastolic dysfunction, ef 50-55%/  trivial AR and PR/ mild MR and TR/  PASP . TRANSURETHRAL RESECTION OF PROSTATE N/A 05/31/2016  Procedure: TRANSURETHRAL RESECTION OF THE PROSTATE (TURP);  Surgeon: Jethro Bolus, MD;  Location: [facility];  Service: Urology;  Laterality: N/A; HPI: Pt is a 79 yo male adm to [facility] with left lobe pna.  Pt PMH + for recent Parkinsons, CHF, GERD, chronic back pain - neck surgeries (ACDF - C3-C5, PCDF C3-C7),  COPD and recent decreased mobility and dysarthria over the last few months.  Left basilar ATX noted on CXR - Pt with hypoxic resp failure.  Subjective: pt sitting up in flouro chair Assessment / Plan / Recommendation CHL IP CLINICAL IMPRESSIONS 06/12/2019 Clinical Impression Pt presents with mild oropharyngeal dysphagia with sensorimotor deficits likely from his Parkinson's disease.  He  demonstrated delayed in oral transiting (up to 5 seconds with thin) with dis coordination/lingual pumping allowing premature spillage into pharynx. Anterior loss of thin barium noted due to decreased labial seal.   Mild thin residuals noted thin oral cavity which pt often swallow to clear but trace retention remained posterior lateral sulci.   Pharyngeal swallow c/b decreased laryngeal closure/elevation and inconsistently impaired epiglottic deflection with thin and solid allowing pharyngeal retention and mild penetration of thin liquids.  Epiglottic deflection improved with puree bolus due to increased muscle recruitment likely.  Penetration of thin occurred during the swallow as barium transited into open larynx during swallow.  Cued cough did not fully clear penetrates and "Hock" did not clear pharynx/vallecular region. Advised pt work to strengthen these to improve airway protection.   Given pt also with difficulty controlling bolus amount and cohesion, nectar liquids advised at this time.  Barium tablet taken with pudding precariously lodged at vallecular region without pt awareness - further bolus of pudding facilitated clearance into esophagus. Given his lack of sensation to large amount of pharyngeal residuals, strict precautions advised.  Upon esophageal sweep, pt with only minimal residuals.   Recommend dys3/nectar, allowing tsp of thin, free water between meals after mouth care and medications crushed.  Using video monitor, educated pt to findings/recommendations during session. SLP Visit Diagnosis Dysphagia, oropharyngeal phase (R13.12) Attention and concentration deficit following -- Frontal lobe and executive function deficit following -- Impact on safety and function Moderate aspiration risk;Mild aspiration risk   [facility] IP TREATMENT RECOMMENDATION 06/12/2019 Treatment Recommendations Therapy as outlined in treatment plan below   Prognosis 06/12/2019 Prognosis for Safe Diet Advancement Fair Barriers to  Reach Goals Time post onset Barriers/Prognosis Comment pt reports dysphagia since his last cervical spine surgery CH[facility] IP DIET RECOMMENDATION 06/12/2019 SLP Diet Recommendations Dysphagia 3 (Mech soft) solids;Nectar thick liquid Liquid Administration via Cup;Straw Medication Administration Crushed with puree Compensations Slow rate;Follow solids with liquid;Small sips/bites Postural Changes --   [facility] IP OTHER RECOMMENDATIONS 06/12/2019 Recommended Consults -- Oral Care Recommendations Oral care QID Other Recommendations --   [facility] IP FOLLOW UP RECOMMENDATIONS 06/12/2019 Follow up Recommendations Skilled Nursing facility   [facility] IP FREQUENCY AND DURATION 06/12/2019 Speech Therapy Frequency (ACUTE ONLY) min 2x/week Treatment Duration 2 weeks      [facility] IP ORAL PHASE 06/12/2019 Oral Phase Impaired Oral - Pudding Teaspoon -- Oral - Pudding Cup -- Oral - Honey Teaspoon -- Oral - Honey Cup -- Oral - Nectar Teaspoon Decreased bolus cohesion;Delayed oral transit;Premature spillage Oral - Nectar Cup -- Oral - Nectar Straw Premature spillage;Decreased bolus cohesion;Delayed oral transit Oral - Thin Teaspoon Decreased bolus  cohesion;Premature spillage;Delayed oral transit;Lingual/palatal residue Oral - Thin Cup Piecemeal swallowing;Delayed oral transit;Decreased bolus cohesion;Premature spillage;Lingual/palatal residue Oral - Thin Straw Decreased bolus cohesion;Premature spillage;Delayed oral transit;Lingual/palatal residue Oral - Puree WFL Oral - Mech Soft Delayed oral transit;Weak lingual manipulation Oral - Regular -- Oral - Multi-Consistency -- Oral - Pill Reduced posterior propulsion Oral Phase - Comment --  CH[facility] IP PHARYNGEAL PHASE 06/12/2019 Pharyngeal Phase Impaired Pharyngeal- Pudding Teaspoon -- Pharyngeal -- Pharyngeal- Pudding Cup -- Pharyngeal -- Pharyngeal- Honey Teaspoon -- Pharyngeal -- Pharyngeal- Honey Cup -- Pharyngeal -- Pharyngeal- Nectar Teaspoon WFL Pharyngeal Material does not enter airway Pharyngeal- Nectar Cup [facility]  Pharyngeal Material does not enter airway Pharyngeal- Nectar Straw WFL Pharyngeal Material does not enter airway Pharyngeal- Thin Teaspoon Delayed swallow initiation-pyriform sinuses;WFL Pharyngeal Material does not enter airway Pharyngeal- Thin Cup Penetration/Aspiration during swallow;Reduced epiglottic inversion;Reduced airway/laryngeal closure;Reduced laryngeal elevation;Pharyngeal residue - valleculae Pharyngeal Material enters airway, remains ABOVE vocal cords and not ejected out Pharyngeal- Thin Straw Reduced epiglottic inversion;Reduced anterior laryngeal mobility;Reduced laryngeal elevation;Penetration/Aspiration during swallow;Pharyngeal residue - valleculae Pharyngeal Material enters airway, remains ABOVE vocal cords and not ejected out Pharyngeal- Puree WFL Pharyngeal Material does not enter airway Pharyngeal- Mechanical Soft Reduced laryngeal elevation;Reduced airway/laryngeal closure;Pharyngeal residue - valleculae Pharyngeal Material does not enter airway Pharyngeal- Regular -- Pharyngeal -- Pharyngeal- Multi-consistency -- Pharyngeal -- Pharyngeal- Pill Pharyngeal residue - valleculae;Reduced epiglottic inversion Pharyngeal Material does not enter airway Pharyngeal Comment --  CHL IP CERVICAL ESOPHAGEAL PHASE 06/12/2019 Cervical Esophageal Phase WFL Pudding Teaspoon -- Pudding Cup -- Honey Teaspoon -- Honey Cup -- Nectar Teaspoon -- Nectar Cup -- Nectar Straw -- Thin Teaspoon -- Thin Cup -- Thin Straw -- Puree -- Mechanical Soft -- Regular -- Multi-consistency -- Pill -- Cervical Esophageal Comment -- Chales Abrahams 06/12/2019, 4:12 PM  Rolena Infante, MS [facility] SLP Acute Rehab Services Office [phone]             CT Renal Stone Study  Result Date: 06/09/2019 CLINICAL DATA:  Flank pain. EXAM: CT ABDOMEN AND PELVIS WITHOUT CONTRAST TECHNIQUE: Multidetector CT imaging of the abdomen and pelvis was performed following the standard protocol without IV contrast. COMPARISON:  CT abdomen pelvis dated  07/15/2017. FINDINGS: Lower chest: Mild bibasilar atelectasis is noted. Hepatobiliary: No focal liver abnormality is seen. Status post cholecystectomy. No biliary dilatation. Pancreas: There is fatty atrophy of the pancreas. Spleen: Normal in size without focal abnormality. Adrenals/Urinary Tract: Adrenal glands are unremarkable. Bilateral nonobstructive renal calculi measure up to 9 mm on the right and 5 mm on the left. The distal aspect of the right ureter near the ureterovesical junction is focally dilated which may reflect changes from prior obstruction in this location. No finding is identified at the ureterovesical junction to suggest ongoing obstruction. Left renal cysts measure up to 4.2 cm. There is no hydronephrosis on the left. The bladder is unremarkable. Stomach/Bowel: Stomach is within normal limits. Appendix appears normal. There is colonic diverticulosis without evidence of diverticulitis. No evidence of bowel wall thickening, distention, or inflammatory changes. Vascular/Lymphatic: Aortic atherosclerosis. No enlarged abdominal or pelvic lymph nodes. Reproductive: Prostate is unremarkable. Other: No abdominal wall hernia or abnormality. No abdominopelvic ascites. Musculoskeletal: No acute osseous findings. Fixation hardware is seen at L4-L5. IMPRESSION: 1. Focal dilatation of the distal right ureter near the ureterovesical junction may reflect changes from prior obstruction in this location. No finding is identified to suggest ongoing obstruction. 2. Bilateral nonobstructive renal calculi. No hydronephrosis on either side. Aortic Atherosclerosis (ICD10-I70.0). Electronically Signed  By: Romona Curls M.D.   On: 06/09/2019 16:42   DG HIPS BILAT WITH PELVIS MIN 5 VIEWS  Result Date: 06/09/2019 CLINICAL DATA:  Fall with left hip pain EXAM: DG HIP (WITH OR WITHOUT PELVIS) 5+V BILAT COMPARISON:  None. FINDINGS: There is no evidence of hip fracture or dislocation. 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 Mild degenerative changes are seen in both hips. Fixation hardware is seen in the lower lumbar spine. IMPRESSION: No acute fracture or dislocation. Electronically Signed   By: Romona Curls M.D.   On: 06/09/2019 16:12    EKG EKG Interpretation  Date/Time:  Monday July 02 2019 15:02:44 EDT Ventricular Rate:  83 PR Interval:    QRS Duration: 144 QT Interval:  389 QTC Calculation: 458 R Axis:   32 Text Interpretation: Sinus rhythm Non-specific ST-t changes IVCD, consider atypical RBBB Confirmed by Cathren Laine ([id number]) on 07/02/2019 4:34:44 PM   Radiology DG Chest Port 1 View  Result Date: 07/02/2019 CLINICAL DATA:  Weakness EXAM: PORTABLE CHEST 1 VIEW COMPARISON:  06/11/2019 FINDINGS: Right costophrenic angle is excluded. Portion of the extreme right apex is excluded. Emphysema with chronic interstitial prominence. Possible patchy increased density in the right lung. Stable elevation the left hemidiaphragm. Small left pleural effusion or pleural thickening. No pneumothorax. Heart size is likely within normal limits for portable technique. Probable hiatal hernia. There is residual contrast within the colon. IMPRESSION: Possible patchy increased density in the right lung superimposed on chronic bilateral interstitial prominence. This could reflect asymmetric edema or atypical pneumonia in the appropriate setting. Small left pleural effusion or pleural thickening. Electronically Signed   By: Guadlupe Spanish M.D.   On: 07/02/2019 15:20    Procedures Procedures (including critical care time)  Medications Ordered in ED Medications  ceFEPIme (MAXIPIME) 2 g in sodium chloride 0.9 % 100 mL IVPB (has no administration in time range)  vancomycin (VANCOCIN) IVPB 1000 mg/200 mL premix (has no administration in time range)  sodium chloride flush (NS) 0.9 % injection 3 mL (3 mLs Intravenous Given 07/02/19 1456)    ED Course  I have reviewed the triage vital signs and the nursing  notes.  Pertinent labs & imaging results that were available during my care of the patient were reviewed by me and considered in my medical decision making (see chart for details).    MDM Rules/Calculators/A&P                      Initial bp in low 100's. Repeat in 80's. Code sepsis initiated. Stat labs. Cultures sent. Iv abx. Iv ns bolus.  Even before fluid bolus in, repeat bp normal (I.e. patient with single, transient, low bp reading)  Reviewed nursing notes and prior charts for additional history.   Labs reviewed/interpreted by me -  UA c/w UTI,  Urine culture sent. Lactate is normal. WBC normal.   CXR reviewed/interpreted by me - persistent infiltrate c/w recent dx pna.  Rocephin iv.   Recheck pt, awake and alert. No pain or discomfort. No increased wob, o2 sats 97%.  Abd soft nt. Tolerating po.  Patient currently appears stable for d/c to ECF.  Rec close pcp f/u.  Return precautions provided.      Final Clinical Impression(s) / ED Diagnoses Final diagnoses:  None    Rx / DC Orders ED Discharge Orders    None       Cathren Laine, MD 07/02/19 [phone]

## 2019-07-02 NOTE — Discharge Instructions (Signed)
It was our pleasure to provide your ER care today - we hope that you feel better.  Take antibiotic as prescribed.   Follow up with primary care doctor in the next few days.  Return to ER if worse, new symptoms, new or severe pain, trouble breathing, persistent vomiting, weak/fainting, or other concern.

## 2019-07-02 NOTE — ED Triage Notes (Signed)
Patient reportedly is altered per facility. Patient reports his urine is cloudy and is feeling more confused than normal. Patient is coming from [facility].  Patient reports he was admitted to [facility] for pnemonia and has been getting antibiotics there.  Patient given of saline en route by EMS

## 2019-07-03 LAB — URINE CULTURE: Culture: >=100000 — AB

## 2019-07-04 ENCOUNTER — Telehealth: Payer: Self-pay | Admitting: Emergency Medicine

## 2019-07-04 NOTE — Telephone Encounter (Signed)
Post ED Visit - Positive Culture Follow-up: Successful Patient Follow-Up  Culture assessed and recommendations reviewed by:  []  , Pharm.D. []  Enzo Bi, Pharm.D., BCPS AQ-ID []  , Pharm.D., BCPS []  Celedonio Miyamoto, .D., BCPS []  Kyle, .D., BCPS, AAHIVP []  Georgina Pillion, Pharm.D., BCPS, AAHIVP []  [address], PharmD, BCPS []  , PharmD, BCPS []  [place], PharmD, BCPS []  [address], PharmD PharmD  Positive urine culture  []  Patient discharged without antimicrobial prescription and treatment is now indicated [x]  Organism is resistant to prescribed ED discharge antimicrobial []  Patient with positive blood cultures  Changes discussed with ED provider: Dr Estella Husk  New antibiotic prescription symptom check, stop keflex, is symptomatic start Fluconazole 200mg  po daily x 7 days Faxed report with recommendations to [facility] attention Ord @ [phone]    Phillips Climes 07/04/2019, 2:06 PM

## 2019-07-04 NOTE — Progress Notes (Signed)
ED Antimicrobial Stewardship Positive Culture Follow Up   Evan [name] is an 79 y.o. male who presented to [facility] on 07/02/2019 with a chief complaint of  Chief Complaint  Patient presents with  . Altered Mental Status    Recent Results (from the past 720 hour(s))  Urine culture     Status: Abnormal   Collection Time: 06/09/19  5:12 PM   Specimen: Urine, Random  Result Value Ref Range Status   Specimen Description   Final    URINE, RANDOM Performed at [facility] [address] [place], [place] [address]    Special Requests   Final    NONE Performed at [facility], [place] [address], [place], [place] [address]    Culture (A)  Final    <10,000 COLONIES/mL INSIGNIFICANT GROWTH Performed at [facility] [address] [place], [place] [address]    Report Status 06/11/2019 FINAL  Final  Respiratory Panel by RT PCR (Flu A&B, Covid) - Nasopharyngeal Swab     Status: None   Collection Time: 06/11/19  3:32 PM   Specimen: Nasopharyngeal Swab  Result Value Ref Range Status   SARS Coronavirus 2 by RT PCR NEGATIVE NEGATIVE Final    Comment: (NOTE) SARS-CoV-2 target nucleic acids are NOT DETECTED. The SARS-CoV-2 RNA is generally detectable in upper respiratoy specimens during the acute phase of infection. The lowest concentration of SARS-CoV-2 viral copies this assay can detect is 131 copies/mL. A negative result does not preclude SARS-Cov-2 infection and should not be used as the sole basis for treatment or other patient management decisions. A negative result may occur with  improper specimen collection/handling, submission of specimen other than nasopharyngeal swab, presence of viral mutation(s) within the areas targeted by this assay, and inadequate number of viral copies (<131 copies/mL). A negative result must be combined with clinical observations, patient history, and epidemiological information. The expected result  is Negative. Fact Sheet for Patients:  PinkCheek.be Fact Sheet for Healthcare Providers:  GravelBags.it This test is not yet ap proved or cleared by the [place] FDA and  has been authorized for detection and/or diagnosis of SARS-CoV-2 by FDA under an Emergency Use Authorization (EUA). This EUA will remain  in effect (meaning this test can be used) for the duration of the COVID-19 declaration under Section 564(b)(1) of the Act, 21 U.S.C. section 360bbb-3(b)(1), unless the authorization is terminated or revoked sooner.    Influenza A by PCR NEGATIVE NEGATIVE Final   Influenza B by PCR NEGATIVE NEGATIVE Final    Comment: (NOTE) The Xpert Xpress SARS-CoV-2/FLU/RSV assay is intended as an aid in  the diagnosis of influenza from Nasopharyngeal swab specimens and  should not be used as a sole basis for treatment. Nasal washings and  aspirates are unacceptable for Xpert Xpress SARS-CoV-2/FLU/RSV  testing. Fact Sheet for Patients: PinkCheek.be Fact Sheet for Healthcare Providers: GravelBags.it This test is not yet approved or cleared by the [place] FDA and  has been authorized for detection and/or diagnosis of SARS-CoV-2 by  FDA under an Emergency Use Authorization (EUA). This EUA will remain  in effect (meaning this test can be used) for the duration of the  Covid-19 declaration under Section 564(b)(1) of the Act, 21  U.S.C. section 360bbb-3(b)(1), unless the authorization is  terminated or revoked. Performed at [facility], [place] [address] [place], [place] [address]   Culture, blood (routine x 2)     Status: Abnormal  Collection Time: 06/11/19  3:59 PM   Specimen: BLOOD  Result Value Ref Range Status   Specimen Description   Final    BLOOD LEFT ANTECUBITAL Performed at [facility], [address], [place], [place]  [address]    Special Requests   Final    BOTTLES DRAWN AEROBIC AND ANAEROBIC Blood Culture adequate volume Performed at [facility], [address] [place], [place] [address]    Culture  Setup Time   Final    GRAM POSITIVE COCCI IN CLUSTERS CRITICAL RESULT CALLED TO, READ BACK BY AND VERIFIED WITH: [employer] CARTER AT [facility] RN, AT [phone] 06/14/19 BY D. VANHOOK AEROBIC BOTTLE ONLY    Culture (A)  Final    STAPHYLOCOCCUS SPECIES (COAGULASE NEGATIVE) THE SIGNIFICANCE OF ISOLATING THIS ORGANISM FROM A SINGLE SET OF BLOOD CULTURES WHEN MULTIPLE SETS ARE DRAWN IS UNCERTAIN. PLEASE NOTIFY THE MICROBIOLOGY DEPARTMENT WITHIN ONE WEEK IF SPECIATION AND SENSITIVITIES ARE REQUIRED. Performed at [facility], [address], [place], [place] [address]    Report Status 06/14/2019 FINAL  Final  Culture, blood (routine x 2)     Status: None   Collection Time: 06/11/19  4:20 PM   Specimen: BLOOD  Result Value Ref Range Status   Specimen Description   Final    BLOOD RIGHT ANTECUBITAL Performed at [facility], [address], [place], [place] [address]    Special Requests   Final    BOTTLES DRAWN AEROBIC AND ANAEROBIC Blood Culture adequate volume Performed at [facility], [address] [place], [place] [address]    Culture   Final    NO GROWTH 5 DAYS Performed at [facility], [address], [place], [place] [address]    Report Status 06/17/2019 FINAL  Final  Blood Culture (routine x 2)     Status: None (Preliminary result)   Collection Time: 07/02/19  3:07 PM   Specimen: BLOOD LEFT HAND  Result Value Ref Range Status   Specimen Description   Final    BLOOD LEFT HAND Performed at [facility], [address] [place], [place] [address]    Special Requests   Final    BOTTLES DRAWN AEROBIC AND ANAEROBIC Blood Culture results may not be optimal due to an excessive volume of blood received in  culture bottles   Culture   Final    NO GROWTH 2 DAYS Performed at [facility], [address], [place], [place] [address]    Report Status PENDING  Incomplete  Blood Culture (routine x 2)     Status: None (Preliminary result)   Collection Time: 07/02/19  3:07 PM   Specimen: Right Antecubital; Blood  Result Value Ref Range Status   Specimen Description   Final    RIGHT ANTECUBITAL Performed at [facility], [address], [place], [place] [address]    Special Requests   Final    BOTTLES DRAWN AEROBIC AND ANAEROBIC Blood Culture results may not be optimal due to an excessive volume of blood received in culture bottles Performed at [facility], [address] [place], [place] [address]    Culture   Final    NO GROWTH 2 DAYS Performed at [facility], [address] [place], [place] [address]    Report Status PENDING  Incomplete  Urine culture     Status: Abnormal   Collection Time: 07/02/19  3:07 PM   Specimen: In/Out Cath Urine  Result Value Ref Range Status   Specimen Description   Final    IN/OUT CATH URINE Performed at [facility], [address] [place], [place] [address]    Special Requests   Final    NONE Performed at [facility], [address] [place], [place] [address]    Culture >=100,000 COLONIES/mL YEAST (A)  Final   Report Status 07/03/2019 FINAL  Final  SARS CORONAVIRUS 2 (TAT 6-24 HRS) Nasopharyngeal Nasopharyngeal Swab     Status: None   Collection Time: 07/02/19  3:52 PM   Specimen: Nasopharyngeal Swab  Result Value Ref Range Status   SARS Coronavirus 2 NEGATIVE NEGATIVE Final    Comment: (NOTE) SARS-CoV-2 target nucleic acids are NOT DETECTED. The SARS-CoV-2 RNA is generally detectable in upper and lower respiratory specimens during the acute phase of infection. Negative results do not preclude SARS-CoV-2 infection, do not rule out co-infections with other  pathogens, and should not be used as the sole basis for treatment or other patient management decisions. Negative results must be combined with clinical observations, patient history, and epidemiological information. The expected result is Negative. Fact Sheet for Patients: HairSlick.no Fact Sheet for Healthcare Providers: quierodirigir.com This test is not yet approved or cleared by the [place] FDA and  has been authorized for detection and/or diagnosis of SARS-CoV-2 by FDA under an Emergency Use Authorization (EUA). This EUA will remain  in effect (meaning this test can be used) for the duration of the COVID-19 declaration under Section 56 4(b)(1) of the Act, 21 U.S.C. section 360bbb-3(b)(1), unless the authorization is terminated or revoked sooner. Performed at [facility], [address] [place], [place] [address]     [x]  Discharged on Keflex, will not treat Yeast in urine []  Patient discharged originally without antimicrobial agent and treatment is now indicated  New antibiotic prescription: IF Pt is Symptomatic,                                               Fluconazole 200mg  daily x 7 days                                              Stop Keflex  ED Provider: Dr  Thank you,  PharmD 07/04/2019, 11:05 AM Clinical Pharmacist [phone]

## 2019-07-06 DIAGNOSIS — N390 Urinary tract infection, site not specified: Secondary | ICD-10-CM | POA: Diagnosis not present

## 2019-07-07 LAB — CULTURE, BLOOD (ROUTINE X 2)
Culture: NO GROWTH
Culture: NO GROWTH

## 2019-07-12 DIAGNOSIS — L89313 Pressure ulcer of right buttock, stage 3: Secondary | ICD-10-CM | POA: Diagnosis not present

## 2019-07-12 DIAGNOSIS — L89323 Pressure ulcer of left buttock, stage 3: Secondary | ICD-10-CM | POA: Diagnosis not present

## 2019-07-19 DIAGNOSIS — L89313 Pressure ulcer of right buttock, stage 3: Secondary | ICD-10-CM | POA: Diagnosis not present

## 2019-07-24 DIAGNOSIS — M6281 Muscle weakness (generalized): Secondary | ICD-10-CM | POA: Diagnosis not present

## 2019-07-24 DIAGNOSIS — R5381 Other malaise: Secondary | ICD-10-CM | POA: Diagnosis not present

## 2019-07-24 DIAGNOSIS — G20 Parkinson's disease: Secondary | ICD-10-CM | POA: Diagnosis not present

## 2019-07-24 DIAGNOSIS — R5383 Other fatigue: Secondary | ICD-10-CM | POA: Diagnosis not present

## 2019-07-24 DIAGNOSIS — R627 Adult failure to thrive: Secondary | ICD-10-CM | POA: Diagnosis not present

## 2019-07-25 DIAGNOSIS — J9601 Acute respiratory failure with hypoxia: Secondary | ICD-10-CM | POA: Diagnosis not present

## 2019-07-25 DIAGNOSIS — I5032 Chronic diastolic (congestive) heart failure: Secondary | ICD-10-CM | POA: Diagnosis not present

## 2019-07-26 DIAGNOSIS — E860 Dehydration: Secondary | ICD-10-CM | POA: Diagnosis not present

## 2019-07-26 DIAGNOSIS — G20 Parkinson's disease: Secondary | ICD-10-CM | POA: Diagnosis not present

## 2019-07-26 DIAGNOSIS — R627 Adult failure to thrive: Secondary | ICD-10-CM | POA: Diagnosis not present

## 2019-07-26 DIAGNOSIS — R05 Cough: Secondary | ICD-10-CM | POA: Diagnosis not present

## 2019-07-26 DIAGNOSIS — L89313 Pressure ulcer of right buttock, stage 3: Secondary | ICD-10-CM | POA: Diagnosis not present

## 2019-07-26 DIAGNOSIS — R0989 Other specified symptoms and signs involving the circulatory and respiratory systems: Secondary | ICD-10-CM | POA: Diagnosis not present

## 2019-07-27 DIAGNOSIS — G20 Parkinson's disease: Secondary | ICD-10-CM | POA: Diagnosis not present

## 2019-07-27 DIAGNOSIS — R05 Cough: Secondary | ICD-10-CM | POA: Diagnosis not present

## 2019-07-27 DIAGNOSIS — R627 Adult failure to thrive: Secondary | ICD-10-CM | POA: Diagnosis not present

## 2019-07-27 DIAGNOSIS — R0989 Other specified symptoms and signs involving the circulatory and respiratory systems: Secondary | ICD-10-CM | POA: Diagnosis not present

## 2019-07-27 DIAGNOSIS — E860 Dehydration: Secondary | ICD-10-CM | POA: Diagnosis not present

## 2019-07-30 DIAGNOSIS — E785 Hyperlipidemia, unspecified: Secondary | ICD-10-CM | POA: Diagnosis not present

## 2019-07-30 DIAGNOSIS — J189 Pneumonia, unspecified organism: Secondary | ICD-10-CM | POA: Diagnosis not present

## 2019-07-31 DIAGNOSIS — R05 Cough: Secondary | ICD-10-CM | POA: Diagnosis not present

## 2019-07-31 DIAGNOSIS — R0989 Other specified symptoms and signs involving the circulatory and respiratory systems: Secondary | ICD-10-CM | POA: Diagnosis not present

## 2019-07-31 DIAGNOSIS — R627 Adult failure to thrive: Secondary | ICD-10-CM | POA: Diagnosis not present

## 2019-07-31 DIAGNOSIS — R5381 Other malaise: Secondary | ICD-10-CM | POA: Diagnosis not present

## 2019-07-31 DIAGNOSIS — G20 Parkinson's disease: Secondary | ICD-10-CM | POA: Diagnosis not present

## 2019-08-01 DIAGNOSIS — G20 Parkinson's disease: Secondary | ICD-10-CM | POA: Diagnosis not present

## 2019-08-01 DIAGNOSIS — B3741 Candidal cystitis and urethritis: Secondary | ICD-10-CM | POA: Diagnosis not present

## 2019-08-01 DIAGNOSIS — N390 Urinary tract infection, site not specified: Secondary | ICD-10-CM | POA: Diagnosis not present

## 2019-08-01 DIAGNOSIS — R5381 Other malaise: Secondary | ICD-10-CM | POA: Diagnosis not present

## 2019-08-18 DEATH — deceased
